# Patient Record
Sex: Male | Born: 1970 | Race: Black or African American | Hispanic: No | Marital: Married | Smoking: Former smoker
Health system: Southern US, Community
[De-identification: ages and names within clinical notes are randomized; demographics above are authoritative.]

## PROBLEM LIST (undated history)

## (undated) DIAGNOSIS — Z86711 Personal history of pulmonary embolism: Secondary | ICD-10-CM

## (undated) DIAGNOSIS — T8859XA Other complications of anesthesia, initial encounter: Secondary | ICD-10-CM

## (undated) DIAGNOSIS — D649 Anemia, unspecified: Secondary | ICD-10-CM

## (undated) DIAGNOSIS — I82409 Acute embolism and thrombosis of unspecified deep veins of unspecified lower extremity: Secondary | ICD-10-CM

## (undated) DIAGNOSIS — N189 Chronic kidney disease, unspecified: Secondary | ICD-10-CM

## (undated) DIAGNOSIS — Z8679 Personal history of other diseases of the circulatory system: Secondary | ICD-10-CM

## (undated) DIAGNOSIS — T4145XA Adverse effect of unspecified anesthetic, initial encounter: Secondary | ICD-10-CM

## (undated) DIAGNOSIS — E669 Obesity, unspecified: Secondary | ICD-10-CM

## (undated) DIAGNOSIS — I714 Abdominal aortic aneurysm, without rupture, unspecified: Secondary | ICD-10-CM

## (undated) DIAGNOSIS — F419 Anxiety disorder, unspecified: Secondary | ICD-10-CM

## (undated) DIAGNOSIS — I517 Cardiomegaly: Secondary | ICD-10-CM

## (undated) DIAGNOSIS — I351 Nonrheumatic aortic (valve) insufficiency: Secondary | ICD-10-CM

## (undated) DIAGNOSIS — I639 Cerebral infarction, unspecified: Secondary | ICD-10-CM

## (undated) DIAGNOSIS — I10 Essential (primary) hypertension: Secondary | ICD-10-CM

## (undated) HISTORY — DX: Essential (primary) hypertension: I10

## (undated) HISTORY — DX: Chronic kidney disease, unspecified: N18.9

## (undated) HISTORY — PX: CORNEAL TRANSPLANT: SHX108

## (undated) HISTORY — DX: Cardiomegaly: I51.7

## (undated) HISTORY — DX: Cerebral infarction, unspecified: I63.9

## (undated) HISTORY — DX: Nonrheumatic aortic (valve) insufficiency: I35.1

## (undated) HISTORY — DX: Personal history of pulmonary embolism: Z86.711

## (undated) HISTORY — DX: Personal history of other diseases of the circulatory system: Z86.79

---
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
## 1995-12-26 HISTORY — PX: CORNEAL TRANSPLANT: SHX108

## 2005-01-23 ENCOUNTER — Emergency Department (HOSPITAL_COMMUNITY): Admission: EM | Admit: 2005-01-23 | Discharge: 2005-01-23 | Payer: Self-pay | Admitting: Emergency Medicine

## 2007-12-26 DIAGNOSIS — Z8679 Personal history of other diseases of the circulatory system: Secondary | ICD-10-CM

## 2007-12-26 DIAGNOSIS — I639 Cerebral infarction, unspecified: Secondary | ICD-10-CM

## 2007-12-26 HISTORY — DX: Cerebral infarction, unspecified: I63.9

## 2007-12-26 HISTORY — DX: Personal history of other diseases of the circulatory system: Z86.79

## 2008-12-09 ENCOUNTER — Inpatient Hospital Stay (HOSPITAL_COMMUNITY): Admission: EM | Admit: 2008-12-09 | Discharge: 2008-12-14 | Payer: Self-pay | Admitting: Emergency Medicine

## 2008-12-10 ENCOUNTER — Encounter (INDEPENDENT_AMBULATORY_CARE_PROVIDER_SITE_OTHER): Payer: Self-pay | Admitting: *Deleted

## 2008-12-10 ENCOUNTER — Encounter: Payer: Self-pay | Admitting: Family Medicine

## 2008-12-10 DIAGNOSIS — I351 Nonrheumatic aortic (valve) insufficiency: Secondary | ICD-10-CM

## 2008-12-10 HISTORY — DX: Nonrheumatic aortic (valve) insufficiency: I35.1

## 2008-12-10 LAB — CONVERTED CEMR LAB: TSH: 1.554 microintl units/mL

## 2008-12-11 ENCOUNTER — Encounter: Payer: Self-pay | Admitting: Family Medicine

## 2008-12-11 LAB — CONVERTED CEMR LAB: Triglycerides: 53 mg/dL

## 2008-12-25 DIAGNOSIS — Z86711 Personal history of pulmonary embolism: Secondary | ICD-10-CM

## 2008-12-25 HISTORY — DX: Personal history of pulmonary embolism: Z86.711

## 2009-02-02 ENCOUNTER — Encounter: Payer: Self-pay | Admitting: Family Medicine

## 2009-02-02 LAB — CONVERTED CEMR LAB
ALT: 12 units/L
Alkaline Phosphatase: 83 units/L
GFR calc Af Amer: 43 mL/min
Total Bilirubin: 0.2 mg/dL

## 2009-02-08 ENCOUNTER — Encounter: Payer: Self-pay | Admitting: Family Medicine

## 2009-05-07 ENCOUNTER — Encounter: Payer: Self-pay | Admitting: Family Medicine

## 2009-05-07 ENCOUNTER — Ambulatory Visit: Payer: Self-pay | Admitting: Family Medicine

## 2009-05-07 ENCOUNTER — Inpatient Hospital Stay (HOSPITAL_COMMUNITY): Admission: EM | Admit: 2009-05-07 | Discharge: 2009-05-09 | Payer: Self-pay | Admitting: Emergency Medicine

## 2009-05-07 ENCOUNTER — Ambulatory Visit: Payer: Self-pay | Admitting: Vascular Surgery

## 2009-05-10 ENCOUNTER — Encounter: Payer: Self-pay | Admitting: Family Medicine

## 2009-05-10 ENCOUNTER — Ambulatory Visit: Payer: Self-pay | Admitting: Family Medicine

## 2009-05-10 DIAGNOSIS — I82409 Acute embolism and thrombosis of unspecified deep veins of unspecified lower extremity: Secondary | ICD-10-CM | POA: Insufficient documentation

## 2009-05-11 LAB — CONVERTED CEMR LAB
HCT: 32.7 % — ABNORMAL LOW (ref 39.0–52.0)
Hemoglobin: 10.9 g/dL — ABNORMAL LOW (ref 13.0–17.0)
MCV: 82.8 fL (ref 78.0–100.0)
RBC: 3.95 M/uL — ABNORMAL LOW (ref 4.22–5.81)
WBC: 7.7 10*3/uL (ref 4.0–10.5)

## 2009-05-12 ENCOUNTER — Ambulatory Visit: Payer: Self-pay | Admitting: Family Medicine

## 2009-05-12 DIAGNOSIS — I10 Essential (primary) hypertension: Secondary | ICD-10-CM | POA: Insufficient documentation

## 2009-05-12 LAB — CONVERTED CEMR LAB: INR: 1.7

## 2009-05-14 ENCOUNTER — Ambulatory Visit: Payer: Self-pay | Admitting: Family Medicine

## 2009-05-21 ENCOUNTER — Ambulatory Visit: Payer: Self-pay | Admitting: Family Medicine

## 2009-05-27 ENCOUNTER — Ambulatory Visit: Payer: Self-pay | Admitting: Family Medicine

## 2009-05-27 DIAGNOSIS — I10 Essential (primary) hypertension: Secondary | ICD-10-CM | POA: Insufficient documentation

## 2009-05-27 DIAGNOSIS — N184 Chronic kidney disease, stage 4 (severe): Secondary | ICD-10-CM | POA: Insufficient documentation

## 2009-06-02 ENCOUNTER — Telehealth: Payer: Self-pay | Admitting: Family Medicine

## 2009-06-02 ENCOUNTER — Ambulatory Visit: Payer: Self-pay | Admitting: Family Medicine

## 2009-06-16 ENCOUNTER — Ambulatory Visit: Payer: Self-pay | Admitting: Family Medicine

## 2009-06-16 LAB — CONVERTED CEMR LAB: INR: 1.7

## 2009-06-30 ENCOUNTER — Ambulatory Visit: Payer: Self-pay | Admitting: Family Medicine

## 2009-06-30 LAB — CONVERTED CEMR LAB: INR: 2.6

## 2009-07-14 ENCOUNTER — Ambulatory Visit: Payer: Self-pay | Admitting: Family Medicine

## 2009-07-14 LAB — CONVERTED CEMR LAB: INR: 2.3

## 2009-07-15 ENCOUNTER — Telehealth (INDEPENDENT_AMBULATORY_CARE_PROVIDER_SITE_OTHER): Payer: Self-pay | Admitting: *Deleted

## 2009-07-18 ENCOUNTER — Encounter: Payer: Self-pay | Admitting: Family Medicine

## 2009-08-04 ENCOUNTER — Ambulatory Visit: Payer: Self-pay | Admitting: Family Medicine

## 2009-08-04 LAB — CONVERTED CEMR LAB: INR: 2.2

## 2009-09-01 ENCOUNTER — Ambulatory Visit: Payer: Self-pay | Admitting: Family Medicine

## 2009-09-01 LAB — CONVERTED CEMR LAB: INR: 2.5

## 2009-09-20 ENCOUNTER — Telehealth: Payer: Self-pay | Admitting: Family Medicine

## 2009-09-30 ENCOUNTER — Encounter: Payer: Self-pay | Admitting: Family Medicine

## 2009-10-07 ENCOUNTER — Telehealth: Payer: Self-pay | Admitting: Family Medicine

## 2009-10-13 ENCOUNTER — Ambulatory Visit: Payer: Self-pay | Admitting: Family Medicine

## 2009-10-13 ENCOUNTER — Encounter: Payer: Self-pay | Admitting: Family Medicine

## 2009-11-03 ENCOUNTER — Ambulatory Visit: Payer: Self-pay | Admitting: Family Medicine

## 2009-11-25 ENCOUNTER — Ambulatory Visit (HOSPITAL_COMMUNITY): Admission: RE | Admit: 2009-11-25 | Discharge: 2009-11-25 | Payer: Self-pay | Admitting: Cardiology

## 2009-12-14 ENCOUNTER — Ambulatory Visit: Payer: Self-pay | Admitting: Vascular Surgery

## 2009-12-21 ENCOUNTER — Encounter: Payer: Self-pay | Admitting: Family Medicine

## 2010-01-07 ENCOUNTER — Encounter: Payer: Self-pay | Admitting: Family Medicine

## 2010-01-20 ENCOUNTER — Encounter: Admission: RE | Admit: 2010-01-20 | Discharge: 2010-01-20 | Payer: Self-pay | Admitting: Cardiology

## 2010-01-20 ENCOUNTER — Encounter: Payer: Self-pay | Admitting: Family Medicine

## 2010-02-11 ENCOUNTER — Ambulatory Visit (HOSPITAL_COMMUNITY): Admission: RE | Admit: 2010-02-11 | Discharge: 2010-02-11 | Payer: Self-pay | Admitting: Cardiovascular Disease

## 2010-02-11 HISTORY — PX: CARDIAC CATHETERIZATION: SHX172

## 2010-02-18 ENCOUNTER — Ambulatory Visit: Payer: Self-pay | Admitting: Vascular Surgery

## 2010-03-07 ENCOUNTER — Encounter: Payer: Self-pay | Admitting: Family Medicine

## 2010-03-28 ENCOUNTER — Emergency Department (HOSPITAL_COMMUNITY): Admission: EM | Admit: 2010-03-28 | Discharge: 2010-03-28 | Payer: Self-pay | Admitting: Emergency Medicine

## 2010-06-14 ENCOUNTER — Encounter: Admission: RE | Admit: 2010-06-14 | Discharge: 2010-06-14 | Payer: Self-pay | Admitting: Vascular Surgery

## 2010-06-14 ENCOUNTER — Ambulatory Visit: Payer: Self-pay | Admitting: Vascular Surgery

## 2010-07-12 ENCOUNTER — Ambulatory Visit: Payer: Self-pay | Admitting: Vascular Surgery

## 2010-08-02 ENCOUNTER — Ambulatory Visit: Payer: Self-pay | Admitting: Vascular Surgery

## 2010-11-04 ENCOUNTER — Ambulatory Visit: Payer: Self-pay | Admitting: Vascular Surgery

## 2011-01-10 DIAGNOSIS — I714 Abdominal aortic aneurysm, without rupture, unspecified: Secondary | ICD-10-CM

## 2011-01-14 ENCOUNTER — Other Ambulatory Visit: Payer: Self-pay | Admitting: Vascular Surgery

## 2011-01-14 DIAGNOSIS — Z01818 Encounter for other preprocedural examination: Secondary | ICD-10-CM

## 2011-01-14 DIAGNOSIS — I714 Abdominal aortic aneurysm, without rupture, unspecified: Secondary | ICD-10-CM

## 2011-01-14 DIAGNOSIS — I7102 Dissection of abdominal aorta: Secondary | ICD-10-CM

## 2011-01-15 ENCOUNTER — Encounter: Payer: Self-pay | Admitting: Cardiovascular Disease

## 2011-01-24 NOTE — Consult Note (Signed)
Summary: [facility]   Imported By: Audie Clear 01/28/2010 16:49:28  _____________________________________________________________________  External Attachment:    Type:   Image     Comment:   External Document

## 2011-01-24 NOTE — Consult Note (Signed)
Summary: [facility]   Imported By: Raymond Gurney 01/10/2010 16:11:42  _____________________________________________________________________  External Attachment:    Type:   Image     Comment:   External Document

## 2011-01-24 NOTE — Consult Note (Signed)
Summary: [facility]   Imported By: Beryle Lathe 03/09/2010 17:05:41  _____________________________________________________________________  External Attachment:    Type:   Image     Comment:   External Document

## 2011-01-31 ENCOUNTER — Ambulatory Visit: Payer: Self-pay | Admitting: Vascular Surgery

## 2011-01-31 ENCOUNTER — Other Ambulatory Visit: Payer: Self-pay

## 2011-02-07 ENCOUNTER — Encounter: Payer: Self-pay | Admitting: Vascular Surgery

## 2011-03-15 LAB — BASIC METABOLIC PANEL
CO2: 25 mEq/L (ref 19–32)
Calcium: 9.1 mg/dL (ref 8.4–10.5)
Creatinine, Ser: 1.89 mg/dL — ABNORMAL HIGH (ref 0.4–1.5)
GFR calc non Af Amer: 40 mL/min — ABNORMAL LOW (ref >60)
Glucose, Bld: 115 mg/dL — ABNORMAL HIGH (ref 70–99)
Sodium: 137 mEq/L (ref 135–145)

## 2011-04-04 LAB — POCT I-STAT 3, ART BLOOD GAS (G3+)
Bicarbonate: 21.8 mEq/L (ref 20.0–24.0)
O2 Saturation: 94 %
TCO2: 23 mmol/L (ref 0–100)
pCO2 arterial: 36.7 mmHg (ref 35.0–45.0)
pH, Arterial: 7.384 (ref 7.350–7.450)
pO2, Arterial: 75 mmHg — ABNORMAL LOW (ref 80.0–100.0)

## 2011-04-04 LAB — BASIC METABOLIC PANEL
BUN: 27 mg/dL — ABNORMAL HIGH (ref 6–23)
CO2: 24 mEq/L (ref 19–32)
CO2: 24 mEq/L (ref 19–32)
Calcium: 9 mg/dL (ref 8.4–10.5)
Chloride: 108 mEq/L (ref 96–112)
Creatinine, Ser: 1.97 mg/dL — ABNORMAL HIGH (ref 0.4–1.5)
GFR calc Af Amer: 51 mL/min — ABNORMAL LOW (ref >60)
Glucose, Bld: 114 mg/dL — ABNORMAL HIGH (ref 70–99)
Glucose, Bld: 98 mg/dL (ref 70–99)
Potassium: 4.6 mEq/L (ref 3.5–5.1)
Potassium: 5.2 mEq/L — ABNORMAL HIGH (ref 3.5–5.1)
Sodium: 136 mEq/L (ref 135–145)

## 2011-04-04 LAB — CK TOTAL AND CKMB (NOT AT ARMC)
CK, MB: 2.9 ng/mL (ref 0.3–4.0)
Total CK: 121 U/L (ref 7–232)

## 2011-04-04 LAB — CBC
Hemoglobin: 11.8 g/dL — ABNORMAL LOW (ref 13.0–17.0)
Hemoglobin: 12.7 g/dL — ABNORMAL LOW (ref 13.0–17.0)
MCHC: 33.5 g/dL (ref 30.0–36.0)
MCHC: 33.6 g/dL (ref 30.0–36.0)
RBC: 4.29 MIL/uL (ref 4.22–5.81)
RBC: 4.59 MIL/uL (ref 4.22–5.81)
RDW: 13.4 % (ref 11.5–15.5)
WBC: 5.4 10*3/uL (ref 4.0–10.5)

## 2011-04-04 LAB — POCT CARDIAC MARKERS
CKMB, poc: 2.1 ng/mL (ref 1.0–8.0)
Myoglobin, poc: 145 ng/mL (ref 12–200)
Troponin i, poc: <0.05 ng/mL (ref 0.00–0.09)

## 2011-04-04 LAB — PSA: PSA: 0.53 ng/mL (ref 0.10–4.00)

## 2011-04-04 LAB — CARDIAC PANEL(CRET KIN+CKTOT+MB+TROPI)
CK, MB: 2.5 ng/mL (ref 0.3–4.0)
Relative Index: INVALID (ref 0.0–2.5)
Total CK: 95 U/L (ref 7–232)

## 2011-04-04 LAB — DIFFERENTIAL
Basophils Relative: 0 % (ref 0–1)
Lymphs Abs: 0.9 10*3/uL (ref 0.7–4.0)
Monocytes Absolute: 0.6 10*3/uL (ref 0.1–1.0)
Monocytes Relative: 11 % (ref 3–12)
Neutro Abs: 3.7 10*3/uL (ref 1.7–7.7)
Neutrophils Relative %: 68 % (ref 43–77)

## 2011-04-04 LAB — APTT: aPTT: 73 seconds — ABNORMAL HIGH (ref 24–37)

## 2011-05-09 NOTE — H&P (Signed)
NAME:  TARAN, LOVEDAY NO.:  [id number]   MEDICAL RECORD NO.:  [id number]          PATIENT TYPE:  INP   LOCATION:  39                         FACILITY:  [facility]   PHYSICIAN:  Neysa Bonito, MD  DATE OF BIRTH:  Aug 26, 1971   DATE OF ADMISSION:  12/09/2008  DATE OF DISCHARGE:                              HISTORY & PHYSICAL   PRIMARY CARE PHYSICIAN:  Unassigned.   CHIEF COMPLAINT:  Low back pain   HISTORY OF PRESENT ILLNESS:  Mr. Zervas is a 40 year old pleasant  African American male with past medical history significant for  hypertension.  The patient is not taking antihypertensive medication and  he is not following with any physician for quite awhile.  Today during  his working, he felt severe low back pain.  The pain was continuous and  he checked himself in with Urgent Care Clinic.  At the Urgent Care  Clinic, the patient was found to have severely elevated blood pressure  260/180 and they also noticed the patient to have proteinuria and  elevated creatinine.  The patient then was instructed to come to the  emergency department.  In the emergency department here, the first blood  pressure recorded is 261/174.  Currently, his systolic blood pressure is  around 200 on Cardene drip.  The Hospitalist Service was called to  assist with management of hypertensive urgency.   The patient currently denied any pain in his back and he denied any  other pain.  The patient denied fever, shortness of breath, headache,  change in vision.   PAST MEDICAL HISTORY:  1. Hypertension.  2. Medical noncompliance.   ALLERGIES:  NO KNOWN MEDICATION ALLERGIES.   MEDICATIONS:  The patient is not taking any medication.   SOCIAL HISTORY:  He denied drug abuse, tobacco abuse or ethanol abuse.  He lives alone in [place] area.   FAMILY HISTORY:  Significant for hypertension in the mother's side of  the family.   REVIEW OF SYSTEMS:  A 14-point review of systems is not  contributory  other than the HPI.   PHYSICAL EXAMINATION:  VITAL SIGNS:  Current temperature is 97.8, last  recorded blood pressure is 221/142, pulse is 86, respiratory rate is 22.  HEENT:  Head atraumatic, normocephalic.  Eyes:  PERRL.  Mouth moist.  No  ulcer.  NECK:  Supple.  No JVD.  LUNGS:  Bilateral decreased air entry felt secondary to the body  habitus.  PRECORDIUM:  First and second heart sound distally audible.  ABDOMEN:  Obese, soft, nontender.  BACK:  There is no point of tenderness in the back.  There is no CVA.  NEUROLOGICALLY:  Alert, oriented x3, sitting in the chair moving all his  extremities spontaneously.  LOWER EXTREMITIES:  There is no edema on the lower extremity  examination.   LABORATORY DATA:  Urine for toxicology screen is essentially negative.  Sodium 140, potassium 3.9, bicarb 26, chloride 106, glucose 112, BUN 29,  creatinine 2.32, estimated GFR is 38, troponin is less than 0.05,  myoglobin is 163, CK-MB is 4.4, white blood count is 4.7, hemoglobin is  14.6, hematocrit is 44.8  and platelet count is 194.   DIAGNOSTICS:  1. EKG pending.  Please see paper work for documentation.  2. Chest x-ray is showing heart is markedly enlarged.  There is      pulmonary vascular congestion without edema or effusion.  There is      no focal infiltrate.   ASSESSMENT:  A 40 year old with hypertensive urgency.  The patient had  evidence of end-organ damage with renal failure and cardiomegaly.   PLAN:  1. The patient will be admitted to monitoring setting in the step-down      units.  We will continue the IV calcium channel blocker.  We will      add oral medication to control the blood pressure and try to wean      him off of the IV drip as soon as possible.  We will be cognizant      not to drop his blood pressure to the normal rapidly.  2. We will obtain a 2-D echo to evaluate for the heart function.  3. We will obtain sonogram to evaluate the kidney.  We will  check      urinalysis and may be 24 hours urine protein.  The patient may need      nephrology assessment at least as an outpatient for worsening renal      failure.  There is no recent renal function panel to compare to.  4. For DVT prophylaxis, we will start the patient on sequential      pneumatic device SCD until the blood pressure is stabilized and      then we will consider blood thinners.  5. For GI prophylaxis, we will start Protonix.      Neysa Bonito, MD  Electronically Signed     EME/MEDQ  D:  12/09/2008  T:  12/10/2008  Job:  [id number]

## 2011-05-09 NOTE — H&P (Signed)
NAME:  [name], [name] NO.:  [id number]   MEDICAL RECORD NO.:  [id number]          PATIENT TYPE:  INP   LOCATION:  2012                         FACILITY:  [facility]   PHYSICIAN:  Talbert Cage, M.D.DATE OF BIRTH:  1971-08-14   DATE OF ADMISSION:  05/07/2009  DATE OF DISCHARGE:                              HISTORY & PHYSICAL   PRIMARY CARE Aspynn Clover:  Unassigned.   PRIMARY CARDIOLOGIST:  Quincy Carnes, MD   CHIEF COMPLAINT:  Chest pain.   HISTORY OF PRESENT ILLNESS:  A 40 year old male newly diagnosed with  hypertension presents with left-sided chest discomfort and left shoulder  pain.  Yesterday a.m. approximately 30 minutes to 1 hour after walking  on treadmill, which lasted 5-minute work out, the patient developed left-  sided chest pain, worse on inspiration.  Pain described as discomfort.  Denies any sharp pain.  Later that evening, the patient was sleeping, he  began having left shoulder pain.  This a.m. left shoulder pain was  associated with left chest pressure, which was nonradiating, worse with  deep inspiration and relieved by Percocet given in ED.  Denies shortness  of breath, nausea, vomiting, diaphoresis associated with episodes.   REVIEW OF SYSTEMS:  Denies fever.  Denies recent illness.  Denies  abdominal pain.  Denies back pain.  Denies change in bowel.  Denies loss  of consciousness.  Denies trauma.  Denies leg swelling.  Denies extended  car trips or plane rides.   PAST MEDICAL HISTORY:  1. Hypertension.  2. Chronic renal insufficiency, baseline 2-2.5.  3. Obesity.  4. Mild-to-moderate aortic regurg.  5. Left ventricular hypertrophy secondary to hypertension.  6. Back pain.  7. Questionable abdominal aortic aneurysm.   PAST SURGICAL HISTORY:  Corneal transplant, 1997.   ALLERGIES:  No known drug allergies.   MEDICATIONS:  1. Hydralazine 50 mg p.o. t.i.d.  2. Twynsta 1 p.o. daily.  3. Aspirin 81 mg 2 tablets p.o. daily.  4.  Clonidine 0.3 mg p.o. b.i.d.  5. Labetalol 600 mg p.o. b.i.d.  6. Pravastatin 20 mg p.o. daily.   SOCIAL HISTORY:  Previous truck Geophysicist/field seismologist.  Currently unemployed.  Denies  tobacco, illicit, or alcohol.   FAMILY HISTORY:  Mother has hypertension.  Denies any bleeding disorders  or coagulopathy disorder in the family.   PHYSICAL EXAMINATION:  VITAL SIGNS:  Temperature 99.4, respiratory rate  16-20, heart rate 66-84, blood pressure 129-136/81-93, O2 sat 97% on  room air.  GENERAL:  No acute distress.  Alert and oriented x3.  Obese.  HEENT:  PERRL.  Nonicteric.  EOMI.  Left corneal transplant.  Oropharynx  clear.  Moist mucous membranes.  No lymphadenopathy.  CVS:  Regular rate and rhythm.  A 2/6 systolic murmur.  No chest wall  pain.  RESPIRATORY:  CTAP.  Equal and good air movement bilaterally.  ABDOMEN:  Positive bowel sounds.  Soft, nontender, nondistended.  No  masses.  EXTREMITIES:  No edema, calves are 15 cm bilaterally.  Negative Homans.  Pulses are 2+.  NEUROLOGIC:  No focal deficits.  Motor 5/5 bilaterally.   LABORATORY DATA:  D-dimer 9.19.  BMET:  Sodium 138,  potassium 5.2,  chloride 108, CO2 of 24, BUN 27, creatinine 1.97, glucose 114, calcium  9.5.  CBC:  White count 5.4, hemoglobin 12.7, hematocrit 37.7, platelets  196.  Differential within normal limits.  Point of care negative x1.  Chest x-ray, impression, stable cardiomegaly, no acute lung disease.  EKG, heart rate 71, normal sinus rhythm, inverted T-waves in lateral  leads, no Q wave.   ASSESSMENT AND PLAN:  A 40 year old male admitted with new onset left-  sided chest pressure concerning for PE or coronary artery disease.  1. Chest pain.  Multiple differentials for the patient's chest pain      including pulmonary embolism in the setting of sedentary lifestyle      and increased D-dimer.  Of note, Wells criteria 0.  No history of      lower extremity swelling.  Coronary artery disease is also      differential,  although the patient would be characterized as      atypical chest pain.  Other differentials include infection,      however, there is no evidence of pneumonia on chest x-ray;      musculoskeletal pain or dissection with history of AAA, but less      likely.  We will admit to telemetry, cycle cardiac enzymes.  Repeat      EKG in a.m.[name]  Continue home antihypertensive medications and treat      with full dose Lovenox until PE ruled out.  For pulmonary embolism,      we will obtain lower extremity Dopplers and ABGs to look at A-a      gradient before committing to PE and a course of Coumadin.  If      negative, we will discontinue treatment with Lovenox.  If      equivocal, we will obtain CT angiogram of chest and prophylaxis for      renal damaging with IV fluids and Mucomyst.  We will not obtain CT      angiogram at this time in setting of chronic renal insufficiency      and unclear etiology of chest pain.  Of note, the patient is not      hypoxic or dyspneic.  2. Hypertension.  Blood pressure stable on meds.  We will continue      home regimen.  3. Chronic renal insufficiency.  Patient at baseline.  We will      prophylaxis for contrast needed for CT angiogram.  4. Fluids, electrolytes, nutrition/gastrointestinal.  No IV fluids      needed.  Heart-healthy diet.  Potassium minimally      elevated at 5.2.  We will repeat BMET in a.m.  5. Prophylaxis. Lovenox per pharmacy treatment dose, renally adjusted.   DISPOSITION:  Pending workup.      Vic Blackbird, MD  Electronically Signed      Talbert Cage, M.D.  Electronically Signed    KD/MEDQ  D:  05/07/2009  T:  05/08/2009  Job:  [id number]

## 2011-05-09 NOTE — Consult Note (Signed)
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  He had some back and flank discomfort and went to the  emergency room and was referred to Dr. Felton Clinton who apparently  discovered left ventricular hypertrophy and subsequent studies revealed  by ultrasound an infrarenal aneurysm which appeared to be an aortic  dissection.  He had a CT scan performed on 11/25/2009 which I reviewed  today and this reveals what appears to a dissection of his infrarenal  aorta with two clear channels at the false lumen extending into the left  common iliac artery and the true lumen into both the right and left  common iliac arteries with maximum diameter of 4.6-4.8 cm in the  infrarenal aorta.  There was not a full chest CT scan performed but  there does not appear to be a dissection above the renal arteries up to  the diaphragm level.  He has had no further abdominal or back symptoms.   CHRONIC STABLE MEDICAL PROBLEMS:  1. Hypertension.  2. History of bilateral DVT in 2009.  3. Pulmonary embolus on chronic Coumadin.  4. Hypertension.  5. Chronic renal insufficiency with creatinine of 1.9-2.0.  6. Left ventricular hypertrophy.   FAMILY HISTORY:  Negative for coronary artery disease, diabetes and  stroke.   SOCIAL HISTORY:  The patient is single, unemployed, has not smoked  cigarettes in 20 years.  Does not use alcohol.   REVIEW OF SYSTEMS:  Has had weight gain of about 8 pounds over the  holidays.  Has dyspnea on exertion.  Legs become weak with walking but  is able to walk 1 mile.  Has chronic constipation, urinary frequency.  All other systems and review of systems are negative.   PHYSICAL EXAM:  Blood pressure is 175/129, heart rate  is 98,  respirations 14, heart rate 70.  General:  He is an obese middle-aged  male in no apparent distress, alert and oriented x3.  Neck is supple, 3+  carotid pulses palpable.  No bruits are audible.  Neurologic exam is  normal.  No palpable adenopathy in the neck.  Chest clear to  auscultation.  Cardiovascular exam is regular rhythm.  No murmurs.  Abdomen is obese.  No palpable masses.  He has 3+ femoral and posterior  tibial pulses bilaterally.  Both feet are well-perfused.  No skin rashes  are noted.  Musculoskeletal exam reveals no joint deformities.   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  No indication for treatment at  this time.  Will follow this in 6 months with a chest and abdominal CT  angiogram to rule out any chest involvement and also to see if the size  has changed.  He will continue to be followed by his medical doctor and  Dr. Felton Clinton for his hypertension.     Nelda Severe Kellie Simmering, M.D.  Electronically Signed   JDL/MEDQ  D:  12/14/2009  T:  12/15/2009  Job:  [id number]   cc:   Vic Blackbird, MD  Quincy Carnes, MD

## 2011-05-09 NOTE — Assessment & Plan Note (Signed)
OFFICE VISIT   [name]  DOB:  Jul 09, 1971                                       06/14/2010  [id number]   The patient returns for continued followup regarding his infrarenal  aortic dissection which was diagnosed in the emergency room about 18  months ago.  I saw him in December of this year at which time the  aneurysm measured about 4.6 to 4.8 cm in maximal diameter.  The  dissection extended into the left common iliac artery (false lumen), in  the true lumen extended into both iliac arteries.  Today he had Jack full  chest CT scan in addition to his abdominal CT angiogram which I have  reviewed.  The official interpretation by the radiologist is not  available at this time.  He does not appear to have any dissection in  the thoracic aorta and the dissection does appear to begin distal to the  renal arteries and extend down to the bifurcation with false lumen  extending into the left side and the true lumen into both sides.  Both  common iliac arteries are patent.  He has had no chest pain, abdominal  or flank pain since I last saw him.   CHRONIC MEDICAL PROBLEMS:  1. Hypertension.  2. History of bilateral DVT in 2009.  3. Pulmonary embolus in 2010 on Coumadin for 6 months and now      discontinued.  4. Chronic renal insufficiency.  Creatinine in the 1.9 to 2 range.   FAMILY HISTORY:  Negative for coronary artery disease, diabetes and  stroke.   SOCIAL HISTORY:  Has not smoked in 20 years.   REVIEW OF SYSTEMS:  Denies any chest pain, dyspnea on exertion.  Does  have constipation and chronic kidney disease, urinary frequency.  All  other systems negative.   PHYSICAL EXAM:  Vital signs:  He weighs 300 pounds, 6 feet tall.  Blood  pressure 120/80, heart rate is 60.  General:  He is Jack well-developed,  well-nourished obese male who is in no apparent distress, alert and  oriented times 3.  HEENT:  Exam normal for age.  EOMs intact.   Lungs:  Clear with no rhonchi or wheezing.  Cardiovascular:  Regular rhythm, no  murmurs.  Abdomen:  Is obese.  No pulsatile mass palpable.  Lower  extremity exam reveals 3+ femoral and posterior tibial pulses  bilaterally.   Today it appears that the infrarenal portion of the aneurysm has  enlarged to about 5 cm in maximum diameter with no change in flow  lumens.  I will see him in 9 months with Jack repeat CT angiogram and we  may need to consider aortic stent grafting at that time depending on the  size.     [name] [name], M.D.  Electronically Signed   JDL/MEDQ  D:  06/14/2010  T:  06/15/2010  Job:  [id number]

## 2011-05-09 NOTE — Discharge Summary (Signed)
NAME:  [name], [name] NO.:  [id number]   MEDICAL RECORD NO.:  [id number]          PATIENT TYPE:  INP   LOCATION:  2012                         FACILITY:  [facility]   PHYSICIAN:  Talbert Cage, M.D.DATE OF BIRTH:  06-Dec-1971   DATE OF ADMISSION:  05/07/2009  DATE OF DISCHARGE:  05/09/2009                               DISCHARGE SUMMARY   DISCHARGE DIAGNOSES:  1. Bilateral lower extremity deep venous thromboses.  2. Pulmonary embolism.  3. Hypertension.  4. Chronic renal insufficiency, baseline 2 to 2.5.  5. Left ventricular hypertrophy secondary to hypertension.  6. Obesity.   DISCHARGE MEDICATIONS:  1. Labetalol 600 mg p.o. b.i.d.  2. Twynsta one tablet p.o. daily.  3. Aspirin 81 mg two tablets daily.  4. Hydralazine 50 mg p.o. t.i.d.  5. Clonidine 0.3 mg p.o. b.i.d.  6. Lovenox 150 mg b.i.d.  7. Coumadin 5 mg two tablets p.o. daily until dose change by primary      care physician.   DISCONTINUE MEDICATIONS:  None.   CONSULTANT:  Pharmacy for Coumadin and Lovenox teaching.   IMAGING:  1. Lower extremity Dopplers; right leg DVT from distal calf to      popliteal, left leg distal calf DVT to mid calf.  2. Chest x-ray May 07, 2009.  Impression, no active lung disease.      Stable cardiomegaly.   DISCHARGE LABS:  INR 1.2, PT 15.8, PTT 73.  PSA 0.53.  Hemoglobin 11.8,  hematocrit 35.2, platelets 181.  Cardiac enzymes negative x2.   BRIEF HOSPITAL COURSE:  A 40 year old male with recently diagnosed  hypertension, chronic renal insufficiency secondary to hypertensive  disease admitted with pleuritic left-sided chest discomfort and left  shoulder pain.  The patient with elevated D-dimer during evaluation in  ED with leading diagnosis of pulmonary embolism.  1. Chronic chest pain.  The patient with pleuritic chest pain.      Differential diagnosis included pulmonary embolus versus infection      versus myocardial ischemia versus musculoskeletal pain.   The      patient was admitted to telemetry, lower extremity Dopplers were      done to rule out pulmonary embolism.  This patient had elevated D-      dimer 9.19, however, with chronic renal insufficiency.  CT      angiogram was unable to be obtained.  Bilateral lower extremity      Dopplers revealed bilateral lower extremity deep venous thromboses.      ABG was obtained which showed an increased AA gradient of 28.9      consistent with pulmonary embolism as likely cause of the patient's      pleuritic chest pain of new onset.  The patient was started on      Lovenox to Coumadin bridge.  At discharge, INR was 1.2.  The      patient has arranged follow up at [facility]      as his new primary care [name] also to follow up INR levels while      on Coumadin.  Of note, other differentials for chest pain included  infection were ruled out.  Chest x-ray did not show any evidence of      infection.  The patient had no leukocytosis and no fever.  Cardiac      enzymes were negative x2.  EKG did not show any evidence of      myocardial ischemia.  Of note, EKG did show slightly prolonged QT      and left ventricular hypertrophy.   For pulmonary embolism workup, age specific causes of malignancy were  also done.  PSA was within normal limits.  The patient may have  colonoscopy as an outpatient to look for other causes of hypercoagulable  state.  Of note, the patient not have any evidence of bleed and did not  complain of any bright red blood per rectum or hematochezia.  Hypercoagulable panel was not done during this admission as we would not  change the patient's further workup or treatment.  The patient does live  quite sedentary lifestyle with obesity, status post recent  hospitalization in December 2009.  The patient will need to be treated  for at least 6 months with new onset PE, goal INR 2 to 3.   Hypertension.  The patient's blood pressure was maintained on  home  medication regimen of labetalol, Twynsta, and hydralazine.  The patient  will continue at discharge.   Chronic renal insufficiency.  Based on history of chronic renal  insufficiency, creatinine remained below baseline during admission.  At  discharge, creatinine was 1.80.   Obesity, unchanged.  The patient has new primary care [name] who will  discuss weight loss options with the patient if the patient motivated.   ISSUES FOR FOLLOWUP:  1. INR level of goal 2 to 3, Lovenox to Coumadin bridge.  2. Weight loss.  3. Colonoscopy as outpatient.   DISCHARGE INSTRUCTIONS:  The patient is to return for any evidence of  bleeding.  The patient was given handout regarding heart healthy as well  as low vitamin K diet while on Coumadin.   FOLLOWUP APPOINTMENTS:  [name], [name] May 12, 2009.  Clinic will call to confirm appointment time.  Lab visit May 10, 2009 between 8:30 and 12.   DISCHARGE CONDITION:  Stable/improved.   DISCHARGE LOCATION:  Home.      Vic Blackbird, MD  Electronically Signed      Talbert Cage, M.D.  Electronically Signed    KD/MEDQ  D:  05/10/2009  T:  05/11/2009  Job:  [id number]

## 2011-05-12 NOTE — Discharge Summary (Signed)
NAME:  [name], [name] NO.:  [id number]   MEDICAL RECORD NO.:  [id number]          PATIENT TYPE:  INP   LOCATION:  2013                         FACILITY:  [facility]   PHYSICIAN:  Quincy Carnes, MD      DATE OF BIRTH:  October 17, 1971   DATE OF ADMISSION:  12/09/2008  DATE OF DISCHARGE:  12/14/2008                               DISCHARGE SUMMARY   DISCHARGE DIAGNOSES:  1. Back pain.  2. Hypertensive crisis.  3. Chronic kidney disease.  4. Obesity.  5. Noncompliance.  6. Mild-to-moderate aortic regurgitation.  7. 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   LABORATORY DATA:  WBCs 6.4, hemoglobin 12.1, hematocrit 37.6, platelets  158.  Sodium 138, potassium 4.1, chloride 106, CO2 of 25, glucose 103,  BUN 23, creatinine 2.10.  At discharge on December 09, 2008, creatinine  was 2.32, total protein was 6, albumin 3.1, magnesium 2.2.  CK-MB #1  192/6.1, troponin 0.11; #2 155/5.2, troponin of 0.17; #3 138/4.8,  troponin of 0.33; #4 121/4.1, troponin of 0.39.  BNP was 493.  Total  cholesterol was 138, triglycerides 53, LDL was 77 and HDL was 50.  TSH  1.554.  Urine drug screen was negative.  A 2-D echocardiogram showed EF  of 65%, small free-flowing pericardial effusion, mild aortic root  dilatation, mild-to-moderate AVR.   DISCHARGE MEDICATIONS:  1. Hydralazine 25 mg every 8 hours.  2. Amlodipine 10 mg a day.  3. Aspirin 81 mg 2 a day.  4. Clonidine 0.3 twice a day.  5. Labetalol 600 mg twice a day.   He will follow up with Dr. Quincy Carnes on January 01, 2009, at 3:15.  He will have renal Dopplers done on December 29, 2008, at 11:00 a.m.   HOSPITAL COURSE:  [name] is a 40 year old male who has a prior  history of hypertension.  He was apparently noncompliant with his  medications.  He came in with a back pain.  His blood pressure was  elevated.  Initial blood pressure reading 260/170.  He was seen by  [facility] and admitted on December 10, 2008.  We were asked to consult.  He had slight CK-MBs and troponins elevated.  We decided to have him  transferred to [facility] and adjusted his medications.  He had initially been placed on a  Cardene drip.  This was discontinued.  He was transferred to [facility] and was seen on December 12, 2008.  His medications continued  to be titrated upward and on December 14, 2008, he was considered stable  for discharge home.      Cyndia Bent, N.P.      Quincy Carnes, MD  Electronically Signed    BB/MEDQ  D:  01/25/2009  T:  01/26/2009  Job:  [id number]

## 2011-06-27 ENCOUNTER — Encounter: Payer: Self-pay | Admitting: Family Medicine

## 2011-09-29 LAB — CBC
HCT: 37.6 % — ABNORMAL LOW (ref 39.0–52.0)
HCT: 36.2 % — ABNORMAL LOW (ref 39.0–52.0)
HCT: 38 % — ABNORMAL LOW (ref 39.0–52.0)
HCT: 39.3 % (ref 39.0–52.0)
Hemoglobin: 12.1 g/dL — ABNORMAL LOW (ref 13.0–17.0)
Hemoglobin: 13.4 g/dL (ref 13.0–17.0)
Hemoglobin: 14.6 g/dL (ref 13.0–17.0)
Hemoglobin: 12 g/dL — ABNORMAL LOW (ref 13.0–17.0)
Hemoglobin: 12.1 g/dL — ABNORMAL LOW (ref 13.0–17.0)
Hemoglobin: 12.7 g/dL — ABNORMAL LOW (ref 13.0–17.0)
MCHC: 32.1 g/dL (ref 30.0–36.0)
MCHC: 31.9 g/dL (ref 30.0–36.0)
MCHC: 32.4 g/dL (ref 30.0–36.0)
MCHC: 33 g/dL (ref 30.0–36.0)
MCV: 81.5 fL (ref 78.0–100.0)
MCV: 81.6 fL (ref 78.0–100.0)
MCV: 80.4 fL (ref 78.0–100.0)
Platelets: 158 10*3/uL (ref 150–400)
Platelets: 162 10*3/uL (ref 150–400)
RBC: 4.62 MIL/uL (ref 4.22–5.81)
RBC: 5.47 MIL/uL (ref 4.22–5.81)
RBC: 4.51 MIL/uL (ref 4.22–5.81)
RBC: 4.85 MIL/uL (ref 4.22–5.81)
RDW: 14.3 % (ref 11.5–15.5)
RDW: 14.6 % (ref 11.5–15.5)
RDW: 14.7 % (ref 11.5–15.5)
WBC: 4.7 10*3/uL (ref 4.0–10.5)
WBC: 6.4 10*3/uL (ref 4.0–10.5)

## 2011-09-29 LAB — COMPREHENSIVE METABOLIC PANEL
ALT: 19 U/L (ref 0–53)
ALT: 12 U/L (ref 0–53)
AST: 21 U/L (ref 0–37)
BUN: 36 mg/dL — ABNORMAL HIGH (ref 6–23)
CO2: 25 mEq/L (ref 19–32)
CO2: 24 mEq/L (ref 19–32)
Calcium: 8.6 mg/dL (ref 8.4–10.5)
Calcium: 8.7 mg/dL (ref 8.4–10.5)
Chloride: 106 mEq/L (ref 96–112)
Creatinine, Ser: 2.46 mg/dL — ABNORMAL HIGH (ref 0.4–1.5)
GFR calc non Af Amer: 36 mL/min — ABNORMAL LOW (ref >60)
GFR calc non Af Amer: 29 mL/min — ABNORMAL LOW (ref >60)
Glucose, Bld: 112 mg/dL — ABNORMAL HIGH (ref 70–99)
Glucose, Bld: 99 mg/dL (ref 70–99)
Sodium: 140 mEq/L (ref 135–145)
Sodium: 135 mEq/L (ref 135–145)
Total Bilirubin: 1 mg/dL (ref 0.3–1.2)
Total Protein: 5.7 g/dL — ABNORMAL LOW (ref 6.0–8.3)

## 2011-09-29 LAB — CATECHOLAMINES, FRACTIONATED, URINE, 24 HOUR
Catecholamines T: 40 ug/24hr (ref <100)
Epinephrine 24 Hr Urine: 2 ug/24hr (ref <20)
Norepinephrine 24 Hr Urine: 37 ug/24hr (ref <80)
Volume, Urine-CATEUR: 2200 mL

## 2011-09-29 LAB — POCT CARDIAC MARKERS
CKMB, poc: 4.4 ng/mL (ref 1.0–8.0)
Myoglobin, poc: 163 ng/mL (ref 12–200)
Troponin i, poc: <0.05 ng/mL (ref 0.00–0.09)

## 2011-09-29 LAB — BASIC METABOLIC PANEL
BUN: 23 mg/dL (ref 6–23)
BUN: 34 mg/dL — ABNORMAL HIGH (ref 6–23)
CO2: 24 mEq/L (ref 19–32)
CO2: 25 mEq/L (ref 19–32)
Calcium: 9.2 mg/dL (ref 8.4–10.5)
Calcium: 8.7 mg/dL (ref 8.4–10.5)
Chloride: 106 mEq/L (ref 96–112)
Chloride: 106 mEq/L (ref 96–112)
Creatinine, Ser: 2.1 mg/dL — ABNORMAL HIGH (ref 0.4–1.5)
Creatinine, Ser: 2.32 mg/dL — ABNORMAL HIGH (ref 0.4–1.5)
GFR calc Af Amer: 38 mL/min — ABNORMAL LOW (ref >60)
GFR calc Af Amer: 43 mL/min — ABNORMAL LOW (ref >60)
GFR calc non Af Amer: 28 mL/min — ABNORMAL LOW (ref >60)
Glucose, Bld: 103 mg/dL — ABNORMAL HIGH (ref 70–99)
Glucose, Bld: 102 mg/dL — ABNORMAL HIGH (ref 70–99)
Glucose, Bld: 103 mg/dL — ABNORMAL HIGH (ref 70–99)
Potassium: 4.4 mEq/L (ref 3.5–5.1)
Potassium: 4.5 mEq/L (ref 3.5–5.1)
Sodium: 140 mEq/L (ref 135–145)
Sodium: 136 mEq/L (ref 135–145)

## 2011-09-29 LAB — URINALYSIS, ROUTINE W REFLEX MICROSCOPIC
Leukocytes, UA: NEGATIVE
Nitrite: NEGATIVE
Protein, ur: 100 mg/dL — AB
Specific Gravity, Urine: 1.013 (ref 1.005–1.030)
Urobilinogen, UA: 0.2 mg/dL (ref 0.0–1.0)

## 2011-09-29 LAB — LIPID PANEL
Cholesterol: 138 mg/dL (ref 0–200)
HDL: 50 mg/dL (ref >39)
LDL Cholesterol: 77 mg/dL (ref 0–99)
Total CHOL/HDL Ratio: 2.8 RATIO
Triglycerides: 53 mg/dL (ref <150)

## 2011-09-29 LAB — DIFFERENTIAL
Lymphs Abs: 0.6 10*3/uL — ABNORMAL LOW (ref 0.7–4.0)
Monocytes Absolute: 0.2 10*3/uL (ref 0.1–1.0)
Monocytes Relative: 5 % (ref 3–12)
Neutro Abs: 3.9 10*3/uL (ref 1.7–7.7)
Neutrophils Relative %: 82 % — ABNORMAL HIGH (ref 43–77)

## 2011-09-29 LAB — RAPID URINE DRUG SCREEN, HOSP PERFORMED
Cocaine: NOT DETECTED
Tetrahydrocannabinol: NOT DETECTED

## 2011-09-29 LAB — METANEPHRINES, PLASMA: Metanephrine, Free: <25 pg/mL (ref <=57)

## 2011-09-29 LAB — CARDIAC PANEL(CRET KIN+CKTOT+MB+TROPI)
CK, MB: 4.8 ng/mL — ABNORMAL HIGH (ref 0.3–4.0)
CK, MB: 5.2 ng/mL — ABNORMAL HIGH (ref 0.3–4.0)
Relative Index: 3.2 — ABNORMAL HIGH (ref 0.0–2.5)
Relative Index: 3.5 — ABNORMAL HIGH (ref 0.0–2.5)
Total CK: 192 U/L (ref 7–232)
Troponin I: 0.11 ng/mL — ABNORMAL HIGH (ref 0.00–0.06)
Troponin I: 0.33 ng/mL — ABNORMAL HIGH (ref 0.00–0.06)
Troponin I: 0.39 ng/mL — ABNORMAL HIGH (ref 0.00–0.06)

## 2011-09-29 LAB — TSH: TSH: 1.554 u[IU]/mL (ref 0.350–4.500)

## 2011-09-29 LAB — URINE MICROSCOPIC-ADD ON

## 2012-01-26 ENCOUNTER — Other Ambulatory Visit: Payer: Self-pay | Admitting: Vascular Surgery

## 2012-01-26 ENCOUNTER — Encounter: Payer: Self-pay | Admitting: Vascular Surgery

## 2012-01-27 LAB — BUN: BUN: 29 mg/dL — ABNORMAL HIGH (ref 6–23)

## 2012-01-27 LAB — CREATININE, SERUM: Creat: 2.56 mg/dL — ABNORMAL HIGH (ref 0.50–1.35)

## 2012-01-29 ENCOUNTER — Encounter: Payer: Self-pay | Admitting: Vascular Surgery

## 2012-01-30 ENCOUNTER — Ambulatory Visit
Admission: RE | Admit: 2012-01-30 | Discharge: 2012-01-30 | Disposition: A | Discharge status: Home / Self Care | Payer: Medicare Other | Source: Ambulatory Visit | Attending: Vascular Surgery | Admitting: Vascular Surgery

## 2012-01-30 ENCOUNTER — Ambulatory Visit
Admission: RE | Admit: 2012-01-30 | Discharge: 2012-01-30 | Disposition: A | Discharge status: Home / Self Care | Payer: Managed Care, Other (non HMO) | Source: Ambulatory Visit | Attending: Vascular Surgery | Admitting: Vascular Surgery

## 2012-01-30 ENCOUNTER — Ambulatory Visit (INDEPENDENT_AMBULATORY_CARE_PROVIDER_SITE_OTHER): Payer: Managed Care, Other (non HMO) | Admitting: Vascular Surgery

## 2012-01-30 ENCOUNTER — Other Ambulatory Visit: Payer: Self-pay

## 2012-01-30 ENCOUNTER — Encounter: Payer: Self-pay | Admitting: Vascular Surgery

## 2012-01-30 VITALS — BP 145/96 | HR 67 | Resp 16 | Ht 72.0 in | Wt 327.0 lb

## 2012-01-30 DIAGNOSIS — I714 Abdominal aortic aneurysm, without rupture, unspecified: Secondary | ICD-10-CM

## 2012-01-30 DIAGNOSIS — Z01818 Encounter for other preprocedural examination: Secondary | ICD-10-CM

## 2012-01-30 DIAGNOSIS — I7102 Dissection of abdominal aorta: Secondary | ICD-10-CM

## 2012-01-30 NOTE — Progress Notes (Signed)
Subjective:     Patient ID: [name], male   DOB: 06/16/1971, 41 y.o.   MRN: [id number]  HPI this 41 year old male returns today for further discussion regarding his infrarenal aortic dissection with secondary aneurysm. I last saw him in June of 2011 at which time the aneurysm was 4.6 x 4.8 cm in maximum diameter. Since then he has had a cardiac catheterization which apparently looked good according to him. He has had no back or abdominal symptoms today he had a CT scan which are reviewed by computer. The maximum diameter of his infrarenal aortic aneurysm is now 5.6 cm. We were unable to assess the true and false lumens because of a creatinine of 2.56 and inability to give contrast. Previous CT scans have revealed the dissection terminated in the proximal left common iliac artery. The diameter of the iliac arteries is unchanged from last study at about 23 mm.  Past Medical History  Diagnosis Date  . Chronic kidney disease   . Hypertension   . Stroke 2009  . History of pulmonary embolus (PE) 2010  . 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 Left ventricular hypertrophy     History  Substance Use Topics  . Smoking status: Former Smoker    Quit date: 01/25/1991  . Smokeless tobacco: Not on file  . Alcohol Use: No    Family History  Problem Relation Age of Onset  . Hypertension Mother   . Hypertension Father     No Known Allergies  Current outpatient prescriptions:aspirin (ANACIN) 81 MG EC tablet, Take 81 mg by mouth daily.  , Disp: , Rfl: ;  cloNIDine (CATAPRES) 0.2 MG tablet, Take 0.2 mg by mouth 3 (three) times daily.  , Disp: , Rfl: ;  hydrALAZINE (APRESOLINE) 50 MG tablet, Take 50 mg by mouth 3 (three) times daily.  , Disp: , Rfl: ;  labetalol (NORMODYNE) 300 MG tablet, Take 600 mg by mouth 2 (two) times daily.  , Disp: , Rfl:  pravastatin (PRAVACHOL) 20 MG tablet, Take 20 mg by mouth at bedtime.  , Disp: , Rfl: ;  Telmisartan-Amlodipine  (TWYNSTA) 80-10 MG TABS, Take 1 tablet by mouth daily.  , Disp: , Rfl: ;  warfarin (COUMADIN) 5 MG tablet, as directed.  , Disp: , Rfl:   BP 145/96  Pulse 67  Resp 16  Ht 6' (1.829 m)  Wt 327 lb (148.326 kg)  BMI 44.35 kg/m2  SpO2 99%  Body mass index is 44.35 kg/(m^2).        Review of Systems denies chest pain but does have dyspnea on exertion. Denies lower extremity claudication symptoms. Has had fairly good control of blood pressure recently. Has lost 20 pounds from a maximum of 347    Objective:   Physical Exam blood pressure 145/96 RA 67 respirations 16 General obese male no apparent distress alert and oriented x3 General well-developed well-nourished HEENT normal for age Lungs no rhonchi or wheezing Cardiovascular regular rhythm no murmurs carotid pulses 3+ no audible bruits Abdomen very obese textile mass is palpable approximating 5 cm in diameter Lower extremities 3+ femoral and posterior tibial pulses palpable bilaterally Neurologic normal Muscle skeletal free major deformities    Assessment:     Enlarging infrarenal aorta from previous aortic dissection and 2010-now 5.6 cm maximum diameter    Plan:     This does need to be repaired. I think he is candidate for aortic stent grafting with termination of dissection  and left common iliac artery. We'll review this further. He will be seeing Dr.Croituro on February 20 for further cardiac evaluation we will tentatively schedule him for Friday, March 1 for aortic stent grafting Risks and benefits were thoroughly discussed with patient and his wife they would like to proceed

## 2012-02-02 ENCOUNTER — Encounter: Payer: Self-pay | Admitting: *Deleted

## 2012-02-02 ENCOUNTER — Other Ambulatory Visit: Payer: Self-pay | Admitting: *Deleted

## 2012-02-02 ENCOUNTER — Encounter (HOSPITAL_COMMUNITY): Payer: Self-pay | Admitting: Pharmacy Technician

## 2012-02-05 MED ORDER — SODIUM CHLORIDE 0.9 % IV SOLN: INTRAVENOUS | Status: DC

## 2012-02-05 MED ORDER — DEXTROSE 5 % IV SOLN
1.5000 g | INTRAVENOUS | Status: DC
  Filled 2012-02-05: qty 1.5

## 2012-02-06 ENCOUNTER — Other Ambulatory Visit: Payer: Self-pay

## 2012-02-06 ENCOUNTER — Encounter (HOSPITAL_COMMUNITY)
Admission: RE | Disposition: A | Discharge status: Home / Self Care | Payer: Self-pay | Source: Ambulatory Visit | Attending: Surgery

## 2012-02-06 ENCOUNTER — Ambulatory Visit (HOSPITAL_COMMUNITY)
Admission: RE | Admit: 2012-02-06 | Discharge: 2012-02-06 | Disposition: A | Discharge status: Home / Self Care | Payer: Managed Care, Other (non HMO) | Source: Ambulatory Visit | Attending: Surgery | Admitting: Surgery

## 2012-02-06 DIAGNOSIS — N189 Chronic kidney disease, unspecified: Secondary | ICD-10-CM | POA: Insufficient documentation

## 2012-02-06 DIAGNOSIS — I714 Abdominal aortic aneurysm, without rupture, unspecified: Secondary | ICD-10-CM

## 2012-02-06 DIAGNOSIS — I7102 Dissection of abdominal aorta: Secondary | ICD-10-CM | POA: Insufficient documentation

## 2012-02-06 DIAGNOSIS — I129 Hypertensive chronic kidney disease with stage 1 through stage 4 chronic kidney disease, or unspecified chronic kidney disease: Secondary | ICD-10-CM | POA: Insufficient documentation

## 2012-02-06 HISTORY — PX: ABDOMINAL AORTAGRAM: SHX5454

## 2012-02-06 SURGERY — ABDOMINAL AORTAGRAM
Anesthesia: LOCAL

## 2012-02-06 MED ORDER — GUAIFENESIN-DM 100-10 MG/5ML PO SYRP: 15.0000 mL | ORAL_SOLUTION | ORAL | Status: DC | PRN

## 2012-02-06 MED ORDER — PHENOL 1.4 % MT LIQD: 1.0000 | OROMUCOSAL | Status: DC | PRN

## 2012-02-06 MED ORDER — ONDANSETRON HCL 4 MG/2ML IJ SOLN: 4.0000 mg | Freq: Four times a day (QID) | INTRAMUSCULAR | Status: DC | PRN

## 2012-02-06 MED ORDER — ACETAMINOPHEN 325 MG RE SUPP: 325.0000 mg | RECTAL | Status: DC | PRN

## 2012-02-06 MED ORDER — METOPROLOL TARTRATE 1 MG/ML IV SOLN: 2.0000 mg | INTRAVENOUS | Status: DC | PRN

## 2012-02-06 MED ORDER — HEPARIN (PORCINE) IN NACL 2-0.9 UNIT/ML-% IJ SOLN
INTRAMUSCULAR | Status: AC
  Filled 2012-02-06: qty 1000

## 2012-02-06 MED ORDER — ACETAMINOPHEN 325 MG PO TABS: 325.0000 mg | ORAL_TABLET | ORAL | Status: DC | PRN

## 2012-02-06 MED ORDER — LABETALOL HCL 5 MG/ML IV SOLN: 10.0000 mg | INTRAVENOUS | Status: DC | PRN

## 2012-02-06 MED ORDER — FENTANYL CITRATE 0.05 MG/ML IJ SOLN
INTRAMUSCULAR | Status: AC
  Filled 2012-02-06: qty 2

## 2012-02-06 MED ORDER — LIDOCAINE HCL (PF) 1 % IJ SOLN
INTRAMUSCULAR | Status: AC
  Filled 2012-02-06: qty 30

## 2012-02-06 MED ORDER — OXYCODONE-ACETAMINOPHEN 5-325 MG PO TABS: 1.0000 | ORAL_TABLET | ORAL | Status: DC | PRN

## 2012-02-06 MED ORDER — SODIUM CHLORIDE 0.9 % IV SOLN: INTRAVENOUS | Status: DC

## 2012-02-06 MED ORDER — HYDRALAZINE HCL 20 MG/ML IJ SOLN: 10.0000 mg | INTRAMUSCULAR | Status: DC | PRN

## 2012-02-06 MED ORDER — MIDAZOLAM HCL 2 MG/2ML IJ SOLN
INTRAMUSCULAR | Status: AC
  Filled 2012-02-06: qty 2

## 2012-02-06 NOTE — H&P (View-Only) (Signed)
Subjective:     Patient ID: [name], male   DOB: [birth year], 41 y.o.   MRN: [id number]  HPI this 41 year old male returns today for further discussion regarding his infrarenal aortic dissection with secondary aneurysm. I last saw him in June of 2011 at which time the aneurysm was 4.6 x 4.8 cm in maximum diameter. Since then he has had a cardiac catheterization which apparently looked good according to him. He has had no back or abdominal symptoms today he had a CT scan which are reviewed by computer. The maximum diameter of his infrarenal aortic aneurysm is now 5.6 cm. We were unable to assess the true and false lumens because of a creatinine of 2.56 and inability to give contrast. Previous CT scans have revealed the dissection terminated in the proximal left common iliac artery. The diameter of the iliac arteries is unchanged from last study at about 23 mm.  Past Medical History  Diagnosis Date  . Chronic kidney disease   . Hypertension   . Stroke 2009  . History of pulmonary embolus (PE) 2010  . 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 Left ventricular hypertrophy     History  Substance Use Topics  . Smoking status: Former Smoker    Quit date: 01/25/1991  . Smokeless tobacco: Not on file  . Alcohol Use: No    Family History  Problem Relation Age of Onset  . Hypertension Mother   . Hypertension Father     No Known Allergies  Current outpatient prescriptions:aspirin (ANACIN) 81 MG EC tablet, Take 81 mg by mouth daily.  , Disp: , Rfl: ;  cloNIDine (CATAPRES) 0.2 MG tablet, Take 0.2 mg by mouth 3 (three) times daily.  , Disp: , Rfl: ;  hydrALAZINE (APRESOLINE) 50 MG tablet, Take 50 mg by mouth 3 (three) times daily.  , Disp: , Rfl: ;  labetalol (NORMODYNE) 300 MG tablet, Take 600 mg by mouth 2 (two) times daily.  , Disp: , Rfl:  pravastatin (PRAVACHOL) 20 MG tablet, Take 20 mg by mouth at bedtime.  , Disp: , Rfl: ;  Telmisartan-Amlodipine  (TWYNSTA) 80-10 MG TABS, Take 1 tablet by mouth daily.  , Disp: , Rfl: ;  warfarin (COUMADIN) 5 MG tablet, as directed.  , Disp: , Rfl:   BP 145/96  Pulse 67  Resp 16  Ht 6' (1.829 m)  Wt 327 lb (148.326 kg)  BMI 44.35 kg/m2  SpO2 99%  Body mass index is 44.35 kg/(m^2).        Review of Systems denies chest pain but does have dyspnea on exertion. Denies lower extremity claudication symptoms. Has had fairly good control of blood pressure recently. Has lost 20 pounds from a maximum of 347    Objective:   Physical Exam blood pressure 145/96 RA 67 respirations 16 General obese male no apparent distress alert and oriented x3 General well-developed well-nourished HEENT normal for age Lungs no rhonchi or wheezing Cardiovascular regular rhythm no murmurs carotid pulses 3+ no audible bruits Abdomen very obese textile mass is palpable approximating 5 cm in diameter Lower extremities 3+ femoral and posterior tibial pulses palpable bilaterally Neurologic normal Muscle skeletal free major deformities    Assessment:     Enlarging infrarenal aorta from previous aortic dissection and 2010-now 5.6 cm maximum diameter    Plan:     This does need to be repaired. I think he is candidate for aortic stent grafting with termination of dissection  and left common iliac artery. We'll review this further. He will be seeing Dr.Croituro on February 20 for further cardiac evaluation we will tentatively schedule him for Friday, March 1 for aortic stent grafting Risks and benefits were thoroughly discussed with patient and his wife they would like to proceed

## 2012-02-06 NOTE — Progress Notes (Signed)
UP AND WALKED AND TOL WELL; RIGHT GROIN STABLE; NO BLEEDING OR HEMATOMA 

## 2012-02-06 NOTE — Interval H&P Note (Signed)
History and Physical Interval Note:  02/06/2012 11:40 AM  [name]  has presented today for surgery, with the diagnosis of aaa  The various methods of treatment have been discussed with the patient and family. After consideration of risks, benefits and other options for treatment, the patient has consented to  Procedure(s) (LRB): ABDOMINAL AORTAGRAM (N/A) as a surgical intervention .  The patients' history has been reviewed, patient examined, no change in status, stable for surgery.  I have reviewed the patients' chart and labs.  Questions were answered to the patient's satisfaction.     [name] IV, V. WELLS

## 2012-02-06 NOTE — Interval H&P Note (Signed)
History and Physical Interval Note:  02/06/2012 9:35 AM  [name]  has presented today for surgery, with the diagnosis of aaa  The various methods of treatment have been discussed with the patient and family. After consideration of risks, benefits and other options for treatment, the patient has consented to  Procedure(s) (LRB): ABDOMINAL AORTAGRAM (N/A) as a surgical intervention .  The patients' history has been reviewed, patient examined, no change in status, stable for surgery.  I have reviewed the patients' chart and labs.  Questions were answered to the patient's satisfaction.     Wisam Siefring IV, V. WELLS

## 2012-02-06 NOTE — Discharge Instructions (Signed)

## 2012-02-06 NOTE — Op Note (Signed)
[facility] [place]  Patient name: [name] MRN: [id number] DOB: 10/22/1971 Sex: male  02/06/2012 Pre-operative Diagnosis: Abdominal aortic aneurysm secondary to dissection Post-operative diagnosis:  Same Surgeon:  Eldridge Abrahams Procedure Performed:  1.  ultrasound access right femoral artery  2.  abdominal aortogram  3.  catheter and aorta x1     Indications:  This is a 41 year old gentleman who suffered a infrarenal aortic dissection which extends into his left common iliac artery. He has renal insufficiency. He comes in today for further evaluation with CO2 in anticipation of endovascular repair of his aneurysm   Procedure:  The patient was identified in the holding area and taken to room 8.  The patient was then placed supine on the table and prepped and draped in the usual sterile fashion.  A time out was called.  Ultrasound was used to evaluate the right common femoral artery.  It was patent .  A digital ultrasound image was acquired.  The right femoral artery was accessed under ultrasound guidance with an 18-gauge needle. An 035 wire was advanced into the aorta under fluoroscopic visualization. A 5 French sheath was placed. Over the wire an Omni flush catheter was placed at the level of L1 and an abdominal aortogram with CO2 was performed. Next the cath was pulled down to the aortic bifurcation and pelvic antrum was performed. Lateral images with the catheter at L1 were also obtained.  Findings:   Aortogram:  The visualized portions of the suprarenal abdominal aorta showed no significant pathology. There are single renal arteries bilaterally which are patent. There is aneurysmal changes and the dissection flap was then the infrarenal abdominal aorta at the level of the repair mesenteric artery. There was preferential filling of the false lumen. The iliac vessels appear to be widely patent bilaterally.    Impression:  #1  aneurysmal changes to the  infrarenal abdominal aorta with associated dissection. No significant stenosis is identified. There is an adequate infrarenal neck for stent graft.     Theotis Burrow, M.D. [facility] [place] Office: [phone] Pager:  [phone]

## 2012-02-07 LAB — POCT I-STAT, CHEM 8
Creatinine, Ser: 2.1 mg/dL — ABNORMAL HIGH (ref 0.50–1.35)
HCT: 35 % — ABNORMAL LOW (ref 39.0–52.0)
Hemoglobin: 11.9 g/dL — ABNORMAL LOW (ref 13.0–17.0)
Potassium: 4.5 mEq/L (ref 3.5–5.1)
Sodium: 142 mEq/L (ref 135–145)

## 2012-02-12 ENCOUNTER — Encounter (HOSPITAL_COMMUNITY): Payer: Self-pay | Admitting: Pharmacy Technician

## 2012-02-13 ENCOUNTER — Encounter (HOSPITAL_COMMUNITY): Payer: Self-pay | Admitting: Emergency Medicine

## 2012-02-13 ENCOUNTER — Emergency Department (HOSPITAL_COMMUNITY)
Admission: EM | Admit: 2012-02-13 | Discharge: 2012-02-14 | Disposition: A | Discharge status: Home Health | Payer: Managed Care, Other (non HMO) | Attending: Emergency Medicine | Admitting: Emergency Medicine

## 2012-02-13 DIAGNOSIS — N189 Chronic kidney disease, unspecified: Secondary | ICD-10-CM | POA: Insufficient documentation

## 2012-02-13 DIAGNOSIS — I129 Hypertensive chronic kidney disease with stage 1 through stage 4 chronic kidney disease, or unspecified chronic kidney disease: Secondary | ICD-10-CM | POA: Insufficient documentation

## 2012-02-13 DIAGNOSIS — Z8673 Personal history of transient ischemic attack (TIA), and cerebral infarction without residual deficits: Secondary | ICD-10-CM | POA: Insufficient documentation

## 2012-02-13 DIAGNOSIS — M7989 Other specified soft tissue disorders: Secondary | ICD-10-CM | POA: Insufficient documentation

## 2012-02-13 DIAGNOSIS — Z86718 Personal history of other venous thrombosis and embolism: Secondary | ICD-10-CM | POA: Insufficient documentation

## 2012-02-13 DIAGNOSIS — Y849 Medical procedure, unspecified as the cause of abnormal reaction of the patient, or of later complication, without mention of misadventure at the time of the procedure: Secondary | ICD-10-CM | POA: Insufficient documentation

## 2012-02-13 DIAGNOSIS — E669 Obesity, unspecified: Secondary | ICD-10-CM | POA: Insufficient documentation

## 2012-02-13 DIAGNOSIS — Z79899 Other long term (current) drug therapy: Secondary | ICD-10-CM | POA: Insufficient documentation

## 2012-02-13 DIAGNOSIS — T82898A Other specified complication of vascular prosthetic devices, implants and grafts, initial encounter: Secondary | ICD-10-CM | POA: Insufficient documentation

## 2012-02-13 HISTORY — DX: Abdominal aortic aneurysm, without rupture, unspecified: I71.40

## 2012-02-13 HISTORY — DX: Abdominal aortic aneurysm, without rupture: I71.4

## 2012-02-13 MED ORDER — ENOXAPARIN SODIUM 150 MG/ML ~~LOC~~ SOLN
150.0000 mg | SUBCUTANEOUS | Status: AC
  Administered 2012-02-14: 150 mg via SUBCUTANEOUS
  Filled 2012-02-13: qty 1

## 2012-02-13 NOTE — ED Notes (Signed)
Patient to [facility] ED with C/O his right leg swelling.  States that he had an angiogram a few days ago. States that he has been up a lot today and he noticed his leg swelling.  RN notes that there is a large ecchymotic area from his mid thigh to his medial mid thigh. No firmness noted. Right DP Pulse is 3+. A small non-pulsatile mass noted at puncture site.  Right leg is significantly larger than the left.  Patient denies back or abdominal pain.

## 2012-02-13 NOTE — Discharge Instructions (Signed)
Return in the morning as scheduled for the vascular Doppler of the right leg. Follow up with your vascular surgeon and cardiologist tomorrow. Return if needed for problems.

## 2012-02-13 NOTE — ED Provider Notes (Signed)
History     CSN: [id number]  Arrival date & time 02/13/12  1737   First MD Initiated Contact with Patient 02/13/12 2247      Chief Complaint  Patient presents with  . Leg Swelling    (Consider location/radiation/quality/duration/timing/severity/associated sxs/prior treatment) HPI [name] is a 41 y.o. male who presents with right leg. Swelling that started today. He states he had an angiogram done one week ago to be evaluated for aortic aneurysm repair. He apparently has aortic aneurysm, secondary to an abdominal aortic dissection. He denies pain or dysesthesia in the right leg. His     Past Medical History  Diagnosis Date  . Chronic kidney disease   . Hypertension   . Stroke 2009  . History of pulmonary embolus (PE) 2010  . 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 Left ventricular hypertrophy   . Abdominal aortic aneurysm     Past Surgical History  Procedure Date  . Cardiac catheterization     Family History  Problem Relation Age of Onset  . Hypertension Mother   . Hypertension Father     History  Substance Use Topics  . Smoking status: Former Smoker    Quit date: 01/25/1991  . Smokeless tobacco: Not on file  . Alcohol Use: No      Review of Systems  All other systems reviewed and are negative.    Allergies  Review of patient's allergies indicates no known allergies.  Home Medications   Current Outpatient Rx  Name Route Sig Dispense Refill  . CLONIDINE HCL 0.2 MG PO TABS Oral Take 0.2 mg by mouth 2 (two) times daily.     [name] HYDRALAZINE HCL 50 MG PO TABS Oral Take 50 mg by mouth 2 (two) times daily.     [name] LABETALOL HCL 300 MG PO TABS Oral Take 300-600 mg by mouth 2 (two) times daily. Pt states he takes 300mg  or 600mg  twice daily depending on blood pressure.    [name] PRAVASTATIN SODIUM 20 MG PO TABS Oral Take 20 mg by mouth at bedtime.        BP 140/89  Pulse 83  Temp(Src) 98.6 F (37 C) (Oral)  Resp 20   SpO2 96%  Physical Exam  Nursing note and vitals reviewed. Constitutional: He is oriented to person, place, and time. He appears well-developed and well-nourished.       He is obese  HENT:  Head: Normocephalic and atraumatic.  Right Ear: External ear normal.  Left Ear: External ear normal.  Eyes: Conjunctivae and EOM are normal. Pupils are equal, round, and reactive to light.  Neck: Normal range of motion and phonation normal. Neck supple.  Cardiovascular: Normal rate, regular rhythm, normal heart sounds and intact distal pulses.   Pulmonary/Chest: Effort normal and breath sounds normal. He exhibits no bony tenderness.  Abdominal: Soft. Normal appearance. There is no tenderness.  Musculoskeletal: Normal range of motion.       Right groin, mildly tender with a 2 x 4 cm area of localized swelling consistent with a subcutaneous hematoma. There is diffuse, flat, ecchymosis of the right groin extending from the lateral hip area to the medial upper thigh. The femoral pulse could not be palpated. The right calf is 4 cm, larger than the left at 20 cm below the patella. He has excellent palpable pulse in the dorsalis pedis bilaterally. His sensation is normal in the feet bilaterally  Neurological: He is alert and oriented to  person, place, and time. He has normal strength. No cranial nerve deficit or sensory deficit. He exhibits normal muscle tone. Coordination normal.  Skin: Skin is warm, dry and intact.  Psychiatric: He has a normal mood and affect. His behavior is normal. Judgment and thought content normal.    ED Course  Procedures (including critical care time) Case is discussed with Dr. Oneida Alar. He advises treating the patient with Lovenox and scanning for venous thrombus in the right leg, tomorrow.      Labs Reviewed - No data to display No results found.   1. Leg swelling       MDM  Localized right leg swelling, status post angiogram. No apparent arterial insufficiency. Patient  has mild renal insufficiency. He is treated with low-dose Lovenox and referred for vascular Doppler in the morning.         Richarda Blade, MD 02/13/12 [phone]

## 2012-02-13 NOTE — ED Notes (Addendum)
Patient had an angiogram on the 12th of this month (insertion site was in right groin area); patient states that he has been on his legs a lot today; patient reporting of right leg pain, swelling, and tightness that extends from his thigh to his ankle.  Patient called primary care physician and was told to come to the ED.  Patient denies shortness of breath and chest pain.

## 2012-02-13 NOTE — ED Notes (Signed)
Vascular called per Dr. Eulis Foster.

## 2012-02-14 ENCOUNTER — Ambulatory Visit (HOSPITAL_COMMUNITY)
Admission: RE | Admit: 2012-02-14 | Discharge: 2012-02-14 | Disposition: A | Discharge status: Home / Self Care | Payer: Managed Care, Other (non HMO) | Source: Ambulatory Visit | Attending: Emergency Medicine | Admitting: Emergency Medicine

## 2012-02-14 ENCOUNTER — Telehealth: Payer: Self-pay | Admitting: *Deleted

## 2012-02-14 ENCOUNTER — Emergency Department (HOSPITAL_COMMUNITY)
Admission: EM | Admit: 2012-02-14 | Discharge: 2012-02-14 | Disposition: A | Discharge status: Home Health | Payer: Managed Care, Other (non HMO) | Attending: Family Medicine | Admitting: Family Medicine

## 2012-02-14 ENCOUNTER — Encounter: Payer: Self-pay | Admitting: *Deleted

## 2012-02-14 ENCOUNTER — Encounter (HOSPITAL_COMMUNITY): Payer: Self-pay | Admitting: *Deleted

## 2012-02-14 DIAGNOSIS — R609 Edema, unspecified: Secondary | ICD-10-CM

## 2012-02-14 DIAGNOSIS — M79609 Pain in unspecified limb: Secondary | ICD-10-CM | POA: Insufficient documentation

## 2012-02-14 DIAGNOSIS — I714 Abdominal aortic aneurysm, without rupture, unspecified: Secondary | ICD-10-CM

## 2012-02-14 DIAGNOSIS — Z79899 Other long term (current) drug therapy: Secondary | ICD-10-CM | POA: Insufficient documentation

## 2012-02-14 DIAGNOSIS — Z7982 Long term (current) use of aspirin: Secondary | ICD-10-CM | POA: Insufficient documentation

## 2012-02-14 DIAGNOSIS — R52 Pain, unspecified: Secondary | ICD-10-CM

## 2012-02-14 DIAGNOSIS — Y849 Medical procedure, unspecified as the cause of abnormal reaction of the patient, or of later complication, without mention of misadventure at the time of the procedure: Secondary | ICD-10-CM | POA: Insufficient documentation

## 2012-02-14 DIAGNOSIS — I129 Hypertensive chronic kidney disease with stage 1 through stage 4 chronic kidney disease, or unspecified chronic kidney disease: Secondary | ICD-10-CM | POA: Insufficient documentation

## 2012-02-14 DIAGNOSIS — I82409 Acute embolism and thrombosis of unspecified deep veins of unspecified lower extremity: Secondary | ICD-10-CM

## 2012-02-14 DIAGNOSIS — Z86718 Personal history of other venous thrombosis and embolism: Secondary | ICD-10-CM | POA: Insufficient documentation

## 2012-02-14 DIAGNOSIS — Z8673 Personal history of transient ischemic attack (TIA), and cerebral infarction without residual deficits: Secondary | ICD-10-CM | POA: Insufficient documentation

## 2012-02-14 DIAGNOSIS — M7989 Other specified soft tissue disorders: Secondary | ICD-10-CM | POA: Insufficient documentation

## 2012-02-14 DIAGNOSIS — N289 Disorder of kidney and ureter, unspecified: Secondary | ICD-10-CM

## 2012-02-14 DIAGNOSIS — S7010XA Contusion of unspecified thigh, initial encounter: Secondary | ICD-10-CM | POA: Insufficient documentation

## 2012-02-14 DIAGNOSIS — N189 Chronic kidney disease, unspecified: Secondary | ICD-10-CM | POA: Insufficient documentation

## 2012-02-14 LAB — POCT I-STAT, CHEM 8
Chloride: 112 mEq/L (ref 96–112)
HCT: 31 % — ABNORMAL LOW (ref 39.0–52.0)
Potassium: 4.6 mEq/L (ref 3.5–5.1)

## 2012-02-14 LAB — DIFFERENTIAL
Lymphs Abs: 1.3 10*3/uL (ref 0.7–4.0)
Monocytes Relative: 10 % (ref 3–12)
Neutro Abs: 4.9 10*3/uL (ref 1.7–7.7)
Neutrophils Relative %: 68 % (ref 43–77)

## 2012-02-14 LAB — CBC
Hemoglobin: 10.2 g/dL — ABNORMAL LOW (ref 13.0–17.0)
RBC: 3.77 MIL/uL — ABNORMAL LOW (ref 4.22–5.81)

## 2012-02-14 LAB — PROTIME-INR: INR: 1.2 (ref 0.00–1.49)

## 2012-02-14 LAB — APTT: aPTT: 41 seconds — ABNORMAL HIGH (ref 24–37)

## 2012-02-14 MED ORDER — RIVAROXABAN 15 MG PO TABS: 15.0000 mg | ORAL_TABLET | Freq: Two times a day (BID) | ORAL | Status: DC

## 2012-02-14 MED ORDER — OXYCODONE-ACETAMINOPHEN 5-325 MG PO TABS: 2.0000 | ORAL_TABLET | ORAL | Status: DC | PRN

## 2012-02-14 MED ORDER — RIVAROXABAN 20 MG PO TABS: 1.0000 | ORAL_TABLET | Freq: Every day | ORAL | Status: DC

## 2012-02-14 MED ORDER — ENOXAPARIN SODIUM 150 MG/ML ~~LOC~~ SOLN
150.0000 mg | SUBCUTANEOUS | Status: AC
  Administered 2012-02-14: 150 mg via SUBCUTANEOUS
  Filled 2012-02-14 (×2): qty 1

## 2012-02-14 NOTE — ED Notes (Signed)
Admitting md in to see pt.  

## 2012-02-14 NOTE — Telephone Encounter (Signed)
Patient called at 4:30 stating that he was having swelling and pain, hardness in groin s/p aortogram 02/06/12. Patient is scheduled for AAA stent graft on 02/23/12. After discussing with team Susa Day, patient was instructed to ED.

## 2012-02-14 NOTE — ED Notes (Signed)
Patient sent here from vascular lab for treatment of positive DVT on the right  Inner thigh area.

## 2012-02-14 NOTE — ED Notes (Signed)
Patient denies pain and is resting comfortably at rest.

## 2012-02-14 NOTE — Telephone Encounter (Signed)
PA required for Xarelto. Form placed in MD box. Paged Dr. Vallarie Mare to notify.

## 2012-02-14 NOTE — Progress Notes (Signed)
*  PRELIMINARY RESULTS* Vascular Ultrasound Lower extremity venous duplex has been completed.  Preliminary findings: Right= Evidence of deep vein thrombus in the common femoral and proximal femoral veins. Superficial thrombus of the greater saphenous vein.  Left= No evidence of deep vein thrombus.  Landry Mellow RDMS 02/14/2012, 9:38 AM

## 2012-02-14 NOTE — Progress Notes (Signed)
[facility]  Patient seen and examined.  Full consult to follow.  Patient is s/p right common femoral artery cannulation and diagnostic aortogram on 12 FEB 13.  The patient now has R femoral DVT without evidence of phlegmasia.  He has echymosis in the right thigh, which likely related some prior hematoma in the right thigh after the right common femoral artery cannulation.  After 8 days, it is highly unlikely there is continued bleeding from the artery cannulation.   The echymosis is just gravity related migration of the hematoma as the medial thigh is soft and without any signs of active bleeding.  I would proceed with anticoagulation with Heparin or Lovenox with eventually Coumadin use.  The only confounding factor is the EVAR scheduled for early March.  At this point, I think the DVT takes priority.  The EVAR is an elective repair so it can be delayed if needed.  Adele Barthel, MD [facility] [place] Office: [phone] Pager: [phone]  02/14/2012, 1:02 PM

## 2012-02-14 NOTE — Consult Note (Signed)
[name] is an 41 y.o. male.   Chief Complaint:  Right leg swelling HPI:  Patient is a 41 y/o aam with hx of DVT in the past who presents with 8 days of right leg swelling s/p angiogram accessed through the right femoral artery. Patient has ecchymosis of the right thigh. He has normal neurovascular exam of the right LE. He has trace edema of the right lower ext. His angiogram was uneventful without postoperative bleeding. The patient takes asa 162 mg daily.  He also has a history of a infrarenal triple AAA with dissection. 5.3 cm. He is planned for an EVAR with Dr. Kellie Simmering.  Patient was evaluated in the ED by Dr. Bridgett Larsson (vascular). He was seen this morning and an ultrasound was performed revealing DVT in the superficial femoral, profunda, superficial saphenous.  The patient denies tachycardia, palpitations, sob, cough, hemoptysis, dizziness, fall.    Past Medical History  Diagnosis Date  . Chronic kidney disease   . Hypertension   . Stroke 2009  . History of pulmonary embolus (PE) 2010  . 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 Left ventricular hypertrophy   . Abdominal aortic aneurysm     Past Surgical History  Procedure Date  . Cardiac catheterization     Family History  Problem Relation Age of Onset  . Hypertension Mother   . Hypertension Father    Social History:  reports that he quit smoking about 21 years ago. He does not have any smokeless tobacco history on file. He reports that he does not drink alcohol or use illicit drugs.  Allergies: No Known Allergies  Medications Prior to Admission  Medication Dose Route Frequency Provider Last Rate Last Dose  . enoxaparin (LOVENOX) injection 150 mg  150 mg Subcutaneous To Major Richarda Blade, MD   150 mg at 02/14/12 0002   Medications Prior to Admission  Medication Sig Dispense Refill  . cloNIDine (CATAPRES) 0.2 MG tablet Take 0.2 mg by mouth 2 (two) times daily.       . hydrALAZINE  (APRESOLINE) 50 MG tablet Take 50 mg by mouth 2 (two) times daily.       [name] labetalol (NORMODYNE) 300 MG tablet Take 300-600 mg by mouth 2 (two) times daily. Pt states he takes 300mg  or 600mg  twice daily depending on blood pressure.        Results for orders placed during the hospital encounter of 02/14/12 (from the past 48 hour(s))  CBC     Status: Abnormal   Collection Time   02/14/12 11:00 AM      Component Value Range Comment   WBC 7.2  4.0 - 10.5 (K/uL)    RBC 3.77 (*) 4.22 - 5.81 (MIL/uL)    Hemoglobin 10.2 (*) 13.0 - 17.0 (g/dL)    HCT 31.5 (*) 39.0 - 52.0 (%)    MCV 83.6  78.0 - 100.0 (fL)    MCH 27.1  26.0 - 34.0 (pg)    MCHC 32.4  30.0 - 36.0 (g/dL)    RDW 12.7  11.5 - 15.5 (%)    Platelets 160  150 - 400 (K/uL)   DIFFERENTIAL     Status: Normal   Collection Time   02/14/12 11:00 AM      Component Value Range Comment   Neutrophils Relative 68  43 - 77 (%)    Neutro Abs 4.9  1.7 - 7.7 (K/uL)    Lymphocytes Relative 18  12 -  46 (%)    Lymphs Abs 1.3  0.7 - 4.0 (K/uL)    Monocytes Relative 10  3 - 12 (%)    Monocytes Absolute 0.7  0.1 - 1.0 (K/uL)    Eosinophils Relative 4  0 - 5 (%)    Eosinophils Absolute 0.3  0.0 - 0.7 (K/uL)    Basophils Relative 0  0 - 1 (%)    Basophils Absolute 0.0  0.0 - 0.1 (K/uL)   PROTIME-INR     Status: Abnormal   Collection Time   02/14/12 11:00 AM      Component Value Range Comment   Prothrombin Time 15.5 (*) 11.6 - 15.2 (seconds)    INR 1.20  0.00 - 1.49    APTT     Status: Abnormal   Collection Time   02/14/12 11:00 AM      Component Value Range Comment   aPTT 41 (*) 24 - 37 (seconds)   POCT I-STAT, CHEM 8     Status: Abnormal   Collection Time   02/14/12 11:18 AM      Component Value Range Comment   Sodium 141  135 - 145 (mEq/L)    Potassium 4.6  3.5 - 5.1 (mEq/L)    Chloride 112  96 - 112 (mEq/L)    BUN 27 (*) 6 - 23 (mg/dL)    Creatinine, Ser 2.10 (*) 0.50 - 1.35 (mg/dL)    Glucose, Bld 102 (*) 70 - 99 (mg/dL)    Calcium, Ion  1.24  1.12 - 1.32 (mmol/L)    TCO2 21  0 - 100 (mmol/L)    Hemoglobin 10.5 (*) 13.0 - 17.0 (g/dL)    HCT 31.0 (*) 39.0 - 52.0 (%)    No results found.  ROS Pertinent items are noted in HPI. No fever, chills, night sweats, weight loss.  Blood pressure 139/97, pulse 67, temperature 97.6 F (36.4 C), temperature source Oral, resp. rate 16, SpO2 96.00%. Physical Exam  Lungs:  Normal respiratory effort, chest expands symmetrically. Lungs are clear to auscultation, no crackles or wheezes. Heart - Regular rate and rhythm.  No murmurs, gallops or rubs.    Abdomen: soft and non-tender without masses, organomegaly or hernias noted.  No guarding or rebound. Unable to auscultate a bruit at the aorta. Right Lower ext: superficial old ecchymosis. No palpable mass/hematoma. Very obese leg. Palpable femoral pulse. DP/PT intact. Negative Homens sign. Trace edema. He has a well healing scar from vascular access cut down.  General: obese, aam, nad.  Assessment/Plan Patient is a 41 y/o aam with dvt of the right femoral veins and lower ext. Swelling with superficial ecchymosis and no underlying hematoma.  1. DVT Hx of dvt/PE in the past without known cause. He was treated with coumadin for 6 months. No smoking, no steroids, no hypercoag. workup. There is a clear cause for this DVT - recent vascular surgery followed by immobility. Will give Lovenox 1.5 mg/kg in ED today. He will start 15 mg BID of Xeralto daily and transition to 15 mg daily. I will plan on keeping him on anticoagulation for 6 months and consider life long therapy. I will see him tomorrow morning at 830 am in the family practice clinic. He will schedule an appointment to see his vascular surgeon ASAP This was discussed with dr. Bridgett Larsson with vascular surgery and he approved. Also discussed with Dr. Nori Riis with family medicine.  2. AAA with dissection Normotensive without vital instability Planned EVAR with vascular  3. Dispo: Home  now, with  close follow up.  Emmali Karow 02/14/2012, 2:43 PM

## 2012-02-14 NOTE — ED Notes (Signed)
Upon re-eval by admitting md, pt now to be d/c'd home after dose of lovenox.

## 2012-02-14 NOTE — ED Provider Notes (Signed)
History     CSN: [id number]  Arrival date & time 02/14/12  [id number]   First MD Initiated Contact with Patient 02/14/12 1011      Chief Complaint  Patient presents with  . DVT    (Consider location/radiation/quality/duration/timing/severity/associated sxs/prior treatment) Patient is a 41 y.o. male presenting with leg pain. The history is provided by the patient.  Leg Pain  Injury mechanism: He had an angiocath about a week ago and has developed right thigh pain and swelling adjacent to catheter entry point.  The pain is present in the right thigh. The pain is mild. The pain has been constant since onset. Associated symptoms comments: He had the angio cath as a part of pre-surgical evaluation for AAA repair to be done by Dr. Kellie Simmering. He has a history of previous DVT with associated PE over 2 years, not on chronic coumadin therapy. He denies SOB,or chest pain today. He was seen last night in the ED and returned this morning for a doppler study of the right leg. He was referred back to ED by vascular lab with a positive finding..    Past Medical History  Diagnosis Date  . Chronic kidney disease   . Hypertension   . Stroke 2009  . History of pulmonary embolus (PE) 2010  . 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 Left ventricular hypertrophy   . Abdominal aortic aneurysm     Past Surgical History  Procedure Date  . Cardiac catheterization     Family History  Problem Relation Age of Onset  . Hypertension Mother   . Hypertension Father     History  Substance Use Topics  . Smoking status: Former Smoker    Quit date: 01/25/1991  . Smokeless tobacco: Not on file  . Alcohol Use: No      Review of Systems  Constitutional: Negative for fever and chills.  HENT: Negative.   Respiratory: Negative.  Negative for shortness of breath.   Cardiovascular: Negative.  Negative for chest pain.  Gastrointestinal: Negative.   Musculoskeletal: Negative.    See HPI  Skin: Negative.   Neurological: Negative.     Allergies  Review of patient's allergies indicates no known allergies.  Home Medications   Current Outpatient Rx  Name Route Sig Dispense Refill  . ASPIRIN 81 MG PO TABS Oral Take 162 mg by mouth daily.    [name] CLONIDINE HCL 0.2 MG PO TABS Oral Take 0.2 mg by mouth 2 (two) times daily.     [name] HYDRALAZINE HCL 50 MG PO TABS Oral Take 50 mg by mouth 2 (two) times daily.     [name] LABETALOL HCL 300 MG PO TABS Oral Take 300-600 mg by mouth 2 (two) times daily. Pt states he takes 300mg  or 600mg  twice daily depending on blood pressure.      BP 140/93  Pulse 74  Temp(Src) 97.6 F (36.4 C) (Oral)  Resp 18  SpO2 96%  Physical Exam  Constitutional: He appears well-developed and well-nourished.  HENT:  Head: Normocephalic.  Neck: Normal range of motion. Neck supple.  Cardiovascular: Normal rate and regular rhythm.   Pulmonary/Chest: Effort normal and breath sounds normal.  Abdominal: Soft. Bowel sounds are normal. There is no tenderness. There is no rebound and no guarding.  Musculoskeletal: Normal range of motion. He exhibits tenderness.       Right thigh proximally has large hematoma anteromedially. Moderately tender. Distal pulses present.   Neurological: He is alert. No  cranial nerve deficit.  Skin: Skin is warm and dry. No rash noted.  Psychiatric: He has a normal mood and affect.    ED Course  Procedures (including critical care time)  Labs Reviewed  CBC - Abnormal; Notable for the following:    RBC 3.77 (*)    Hemoglobin 10.2 (*)    HCT 31.5 (*)    All other components within normal limits  PROTIME-INR - Abnormal; Notable for the following:    Prothrombin Time 15.5 (*)    All other components within normal limits  APTT - Abnormal; Notable for the following:    aPTT 41 (*)    All other components within normal limits  POCT I-STAT, CHEM 8 - Abnormal; Notable for the following:    BUN 27 (*)    Creatinine, Ser 2.10 (*)      Glucose, Bld 102 (*)    Hemoglobin 10.5 (*)    HCT 31.0 (*)    All other components within normal limits  DIFFERENTIAL   No results found.   No diagnosis found.  Dr. Alvino Chapel in to evaluate patient. Admission arranged with medicine after consult with vascular surgeon.  MDM  [facility] evaluation and coordinated care with Dr. Bridgett Larsson of vascular surgery. To be discharged home, given Lovenox and will be seen by [employer] in the morning in the clinic for further treatment.       Leotis Shames, PA-C 02/14/12 1204  Leotis Shames, PA-C 02/14/12 1511

## 2012-02-14 NOTE — Progress Notes (Addendum)
ANTICOAGULATION CONSULT NOTE - Initial Consult  Pharmacy Consult for Lovenox Indication: DVT  No Known Allergies  Patient Measurements:   Wt = 323 lbs = 146 kg  Vital Signs: Temp: 97.6 F (36.4 C) (02/20 0936) Temp src: Oral (02/20 0936) BP: 139/97 mmHg (02/20 1150) Pulse Rate: 67  (02/20 1150)  Labs:  Basename 02/14/12 1118 02/14/12 1100  HGB 10.5* 10.2*  HCT 31.0* 31.5*  PLT -- 160  APTT -- 41*  LABPROT -- 15.5*  INR -- 1.20  HEPARINUNFRC -- --  CREATININE 2.10* --  CKTOTAL -- --  CKMB -- --  TROPONINI -- --   The CrCl is unknown because both a height and weight (above a minimum accepted value) are required for this calculation.  Medical History: Past Medical History  Diagnosis Date  . Chronic kidney disease   . Hypertension   . Stroke 2009  . History of pulmonary embolus (PE) 2010  . 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 Left ventricular hypertrophy   . Abdominal aortic aneurysm     Medications:  Medications Prior to Admission  Medication Dose Route Frequency Provider Last Rate Last Dose  . enoxaparin (LOVENOX) injection 150 mg  150 mg Subcutaneous To Major Richarda Blade, MD   150 mg at 02/14/12 0002   Medications Prior to Admission  Medication Sig Dispense Refill  . cloNIDine (CATAPRES) 0.2 MG tablet Take 0.2 mg by mouth 2 (two) times daily.       . hydrALAZINE (APRESOLINE) 50 MG tablet Take 50 mg by mouth 2 (two) times daily.       [name] labetalol (NORMODYNE) 300 MG tablet Take 300-600 mg by mouth 2 (two) times daily. Pt states he takes 300mg  or 600mg  twice daily depending on blood pressure.        Assessment: 41 yo male with recent  Angiogram admitted with new DVT.  CBC reduced, but OK for lovenox initation.  No anticoagulation PTA.  Received a dose of Lovenox 150 mg last night at 0002.  Due for next dose now.    Plan:  1. Lovenox 150 mg SQ BID.  Will give 1st dose now (3 PM), then will need lovenox dose at 3  AM. 2. Plans for Xarelto to start after AM MD appt.  1st dose will be due at 3 PM.  Lorrin Bodner C 02/14/2012,2:25 PM

## 2012-02-14 NOTE — ED Provider Notes (Signed)
Medical screening examination/treatment/procedure(s) were performed by non-physician practitioner and as supervising physician I was immediately available for consultation/collaboration.  Jasper Riling. Alvino Chapel, MD 02/14/12 1531

## 2012-02-14 NOTE — Discharge Instructions (Signed)
FOLLOW UP PER SCHEDULED APPOINTMENT IN THE MORNING WITH FAMILY PRACTICE. RETURN HERE AS NEEDED.  Deep Vein Thrombosis A deep vein thrombosis (DVT) is a blood clot (thrombus) that develops in a deep vein. A DVT is a clot in the deep, larger veins of the leg, arm, or pelvis. These are more dangerous than clots that might form in veins on the surface of the body. Deep vein thrombosis can lead to complications if the clot breaks off and travels in the bloodstream to the lungs. CAUSES Blood clots form in a vein for different reasons. Usually several things cause blood clots. They include:  The flow of blood slows down.   The inside of the vein is damaged in some way.   The person has a condition that makes blood clot more easily. These conditions may include:   Older age (especially over 42 years old).   Having a history of blood clots.   Having major or lengthy surgery. Hip surgery is particularly high-risk.   Breaking a hip or leg.   Sitting or lying still for a long time.   Cancer or cancer treatment.   Having a long, thin tube (catheter) placed inside a vein during a medical procedure.   Being overweight (obese).   Pregnancy and childbirth.   Medicines with estrogen.   Smoking.   Other circulation or heart problems.  SYMPTOMS When a clot forms, it can either partially or totally block the blood flow in that vein. Symptoms of a DVT can include:  Swelling of the leg or arm, especially if one side is much worse.   Warmth and redness of the leg or arm, especially if one side is much worse.   Pain in an arm or leg. If the clot is in the leg, symptoms may be more noticeable or worse when standing or walking.  If the blood clot travels to the lung, it may cause:  Shortness of breath.   Chest pain. The pain may be worsened by deep breaths.   Coughing up thick mucus (phlegm), possibly flecked with blood.  Anyone with these symptoms should get emergency medical treatment right  away. Call your local emergency services (911 in [place] if you have these symptoms. DIAGNOSIS If a DVT is suspected, your caregiver will take a full medical history. He or she will also perform a physical exam. Tests that also may be required include:  Studies of the clotting properties of the blood.   An ultrasound scan.   X-rays to show the flow of blood when special dye is injected into the veins (venography).   Studies of your lungs if you have any chest symptoms.  PREVENTION  Exercise the legs regularly. Take a brisk 30 minute walk every day.   Maintain a weight that is appropriate for your height.   Avoid sitting or lying in bed for long periods of time without moving your legs.   Women, particularly those over the age of 33, should consider the risks and benefits of taking estrogen medicines, including birth control pills.   Do not smoke, especially if you take estrogen medicines.   Long-distance travel can increase your risk. You should exercise your legs by walking or pumping the muscles every hour.   In hospital prevention:   Prevention may include medical and nonmedical measures.  TREATMENT  The most common treatment for DVT is blood thinning (anticoagulant) medicine, which reduces the blood's tendency to clot. Anticoagulants can stop new blood clots from forming and old  ones from growing. They cannot dissolve existing clots. Your body does this by itself over time. Anticoagulants can be given by mouth, by intravenous (IV) access, or by injection. Your caregiver will determine the best program for you.   Less commonly, clot-dissolving drugs (thrombolytics) are used to dissolve a DVT. They carry a high risk of bleeding, so they are used mainly in severe cases.   Very rarely, a blood clot in the leg needs to be removed surgically.   If you are unable to take anticoagulants, your caregiver may arrange for you to have a filter placed in a main vein in your belly (abdomen).  This filter prevents clots from traveling to your lungs.  HOME CARE INSTRUCTIONS  Take all medicines prescribed by your caregiver. Follow the directions carefully.   You will most likely continue taking anticoagulants after you leave the hospital. Your caregiver will advise you on the length of treatment (usually 3 to 6 months, sometimes for life).   Taking too much or too little of an anticoagulant is dangerous. While taking this type of medicine, you will need to have regular blood tests to be sure the dose is correct. The dose can change for many reasons. It is critically important that you take this medicine exactly as prescribed, and that you have blood tests exactly as directed.   Many foods can interfere with anticoagulants. These include foods high in vitamin K, such as spinach, kale, broccoli, cabbage, collard and turnip greens, Brussels sprouts, peas, cauliflower, seaweed, parsley, beef and pork liver, green tea, and soybean oil. Your caregiver should discuss limits on these foods with you or you should arrange a visit with a dietician to answer your questions.   Many medicines can interfere with anticoagulants. You must tell your caregiver about any and all medicines you take. This includes all vitamins and supplements. Be especially cautious with aspirin and anti-inflammatory medicines. Ask your caregiver before taking these.   Anticoagulants can have side effects, mostly excessive bruising or bleeding. You will need to hold pressure over cuts for longer than usual. Avoid alcoholic drinks or consume only very small amounts while taking this medicine.   If you are taking an anticoagulant:   Wear a medical alert bracelet.   Notify your dentist or other caregivers before procedures.   Avoid contact sports.   Ask your caregiver how soon you can go back to normal activities. Not being active can lead to new clots. Ask for a list of what you should and should not do.   Exercise your  lower leg muscles. This is important while traveling.   You may need to wear compression stockings. These are tight elastic stockings that apply pressure to the lower legs. This can help keep the blood in the legs from clotting.   If you are a smoker, you should quit.   Learn as much as you can about DVT.  SEEK MEDICAL CARE IF:  You have unusual bruising or any bleeding problems.   The swelling or pain in your affected arm or leg is not gradually improving.   You anticipate surgery or long-distance travel. You should get specific advice on DVT prevention.   You discover other family members with blood clots. This may require further testing for inherited diseases or conditions.  SEEK IMMEDIATE MEDICAL CARE IF:  You develop chest pain.   You develop severe shortness of breath.   You begin to cough up bloody mucus or phlegm (sputum).   You feel dizzy  or faint.   You develop swelling or pain in the leg.   You have breathing problems after traveling.  MAKE SURE YOU:  Understand these instructions.   Will watch your condition.   Will get help right away if you are not doing well or get worse.  Document Released: 12/11/2005 Document Revised: 08/23/2011 Document Reviewed: 02/02/2011 [facility] Patient Information 2012 [place].

## 2012-02-14 NOTE — ED Notes (Signed)
Pt presents to dept with pos. Vascular study for rt dvt. Pt states noticing pain and swelling to rt leg yesterday. Pt had angiogram study last week to assess pt's AAA for repair. Pt has bruising to rt upper leg and groin from that procedure but pain and swelling became noticeable yesterday. Pt denies pain at rest. Denies sob/cp. Pt in no acute distress.

## 2012-02-15 ENCOUNTER — Encounter: Payer: Self-pay | Admitting: Family Medicine

## 2012-02-15 ENCOUNTER — Ambulatory Visit (INDEPENDENT_AMBULATORY_CARE_PROVIDER_SITE_OTHER): Payer: Managed Care, Other (non HMO) | Admitting: Family Medicine

## 2012-02-15 VITALS — BP 158/94 | HR 66 | Temp 97.6°F | Ht 72.0 in | Wt 338.0 lb

## 2012-02-15 DIAGNOSIS — I824Y9 Acute embolism and thrombosis of unspecified deep veins of unspecified proximal lower extremity: Secondary | ICD-10-CM

## 2012-02-15 DIAGNOSIS — Z86718 Personal history of other venous thrombosis and embolism: Secondary | ICD-10-CM | POA: Insufficient documentation

## 2012-02-15 NOTE — Patient Instructions (Signed)
It was great to see you today!  Schedule an appointment to see me in 2 weeks.  I want you to follow up with vascular surgery as soon as possible.  You are taking Xeralto 15 mg BID - I will fill out the authorization form.

## 2012-02-15 NOTE — Assessment & Plan Note (Signed)
Patient seen in the ED yesterday by myself and Dr. Bridgett Larsson with vascular surgery. Has femoral DVT.  Given lovenox treatment dose yesterday. He will start xeralto 15 mg BID for three weeks then switch to 15 mg daily. I have given him the 10 day free supply and a sample. I will fill out the prior-authorization form.  Patient is to have EVAR - planning for this and holding of anticoagulation per vascular.  I will keep him on xeralto lifelong because this is his third thrombosis, despite this being precipitated by surgery.

## 2012-02-15 NOTE — Telephone Encounter (Signed)
Approval received for Xarelto  15 mg . Pharmacy notified.

## 2012-02-15 NOTE — Progress Notes (Signed)
  Subjective:    Patient ID: [name], male    DOB: 09/12/1971, 41 y.o.   MRN: [id number]  HPI 1. Hospital f/u for DVT right femoral veins/superficial saphenous. Patient seen in the ED yesterday by myself and Dr. Bridgett Larsson with vasc surgery. Found to have DVT's following right groin angiogram 8 days prior. Patient has history of DVT's and PE, was on a 6 month coumadin course. He is current only on two 81 mg of ASA daily. Patient also has a 5.6 cm AAA with dissection planned for EVAR with Dr. Kellie Simmering in a couple weeks. This may be on hold.  He also has follow up with his cardiologist who is managing his HTN. He is on 5 antiHTN meds.    Review of Systems Pertinent items are noted in HPI. No fever, chills, night sweats, weight loss.    Objective:   Physical Exam Filed Vitals:   02/15/12 0832  BP: 158/94  Pulse: 66  Temp: 97.6 F (36.4 C)  TempSrc: Oral  Height: 6' (1.829 m)  Weight: 338 lb (153.316 kg)  Lungs:  Normal respiratory effort, chest expands symmetrically. Lungs are clear to auscultation, no crackles or wheezes. Heart - Regular rate and rhythm.  No murmurs, gallops or rubs.    Abdomen: soft and non-tender without masses, organomegaly or hernias noted.  No guarding or rebound. No bruit auscultated. EXT: right thigh superficial ecchymosis. Nontender. Minimal edema of the right leg. He has very obese legs.    Assessment & Plan:

## 2012-02-15 NOTE — Consult Note (Signed)
[facility] Attending CONSULT Note: Dorcas Mcmurray MD [phone] pager office [phone] I  have  reviewed the chart for this patient, discussed this patient with the resident. I agree with the resident's findings, assessment and care plan. He will be scheduled for AM followup in [facility] clinic with Dr Vallarie Mare. Plan is to start [place].

## 2012-02-16 ENCOUNTER — Other Ambulatory Visit (HOSPITAL_COMMUNITY): Payer: Managed Care, Other (non HMO)

## 2012-02-19 ENCOUNTER — Other Ambulatory Visit: Payer: Self-pay | Admitting: *Deleted

## 2012-02-19 ENCOUNTER — Inpatient Hospital Stay (HOSPITAL_COMMUNITY): Admission: RE | Admit: 2012-02-19 | Payer: Managed Care, Other (non HMO) | Source: Ambulatory Visit

## 2012-02-19 DIAGNOSIS — I82409 Acute embolism and thrombosis of unspecified deep veins of unspecified lower extremity: Secondary | ICD-10-CM

## 2012-02-23 ENCOUNTER — Ambulatory Visit (HOSPITAL_COMMUNITY)
Admission: RE | Admit: 2012-02-23 | Payer: Managed Care, Other (non HMO) | Source: Ambulatory Visit | Admitting: Vascular Surgery

## 2012-02-23 ENCOUNTER — Encounter (HOSPITAL_COMMUNITY): Admission: RE | Payer: Self-pay | Source: Ambulatory Visit

## 2012-02-23 SURGERY — ENDOVASCULAR STENT GRAFT INSERTION
Anesthesia: General

## 2012-02-28 ENCOUNTER — Ambulatory Visit (INDEPENDENT_AMBULATORY_CARE_PROVIDER_SITE_OTHER): Payer: Managed Care, Other (non HMO) | Admitting: Family Medicine

## 2012-02-28 ENCOUNTER — Encounter: Payer: Self-pay | Admitting: Family Medicine

## 2012-02-28 VITALS — BP 124/98 | HR 84 | Temp 98.3°F | Ht 72.0 in | Wt 323.0 lb

## 2012-02-28 DIAGNOSIS — I719 Aortic aneurysm of unspecified site, without rupture: Secondary | ICD-10-CM

## 2012-02-28 DIAGNOSIS — I824Y9 Acute embolism and thrombosis of unspecified deep veins of unspecified proximal lower extremity: Secondary | ICD-10-CM

## 2012-02-28 DIAGNOSIS — I714 Abdominal aortic aneurysm, without rupture, unspecified: Secondary | ICD-10-CM

## 2012-02-28 DIAGNOSIS — N529 Male erectile dysfunction, unspecified: Secondary | ICD-10-CM

## 2012-02-28 MED ORDER — VARDENAFIL HCL 10 MG PO TABS: 10.0000 mg | ORAL_TABLET | Freq: Every day | ORAL | Status: DC | PRN

## 2012-02-28 MED ORDER — TADALAFIL 20 MG PO TABS: 10.0000 mg | ORAL_TABLET | ORAL | Status: DC | PRN

## 2012-02-28 MED ORDER — SILDENAFIL CITRATE 100 MG PO TABS: 50.0000 mg | ORAL_TABLET | Freq: Every day | ORAL | Status: DC | PRN

## 2012-02-28 NOTE — Progress Notes (Signed)
  Subjective:    Patient ID: [name], male    DOB: [birth year], 41 y.o.   MRN: [id number]  HPI 1. Problem swallowing pravastatin pill He says when he swallowed the pill it stuck in the back of his throat and he has tried with water. His last LDL was in 2009 and was 77.  He is currently not taking his statin.   2. Erectile dysfunction 2 month history of inability to get an erection. He is not depressed, has good energy, has not lost desire, he is frustrated with his lack of ability to get erect. Medications reviewed, none cause obvious erectile dysfunction. He is recently married as of nov. Prior to Jan. He has no erectile dysfunction.  3. Hx of DVT Patient was seen by me and placed on Xarelto because of DVT in the right leg. He is also followed by Dr. Kellie Simmering with Vascular surgery. Patient will be reassesed with the vascular surgery team for endovascular grafting of his dissecting triple A. His blood pressure looked acceptable today. BP Readings from Last 3 Encounters:  02/28/12 124/98  02/15/12 158/94  02/14/12 139/97  His blood pressure medications are managed by his cardiologist.    Review of Systems Pertinent items are noted in HPI. No fever, chills, night sweats, weight loss.     Objective:   Physical Exam Filed Vitals:   02/28/12 1459  BP: 124/98  Pulse: 84  Temp: 98.3 F (36.8 C)  TempSrc: Oral  Height: 6' (1.829 m)  Weight: 323 lb (146.512 kg)   Lungs:  Normal respiratory effort, chest expands symmetrically. Lungs are clear to auscultation, no crackles or wheezes. Heart - Regular rate and rhythm.  No murmurs, gallops or rubs.    Extremities:  No cyanosis, edema, or deformity noted with good range of motion of all major joints.       Assessment & Plan:

## 2012-02-28 NOTE — Assessment & Plan Note (Signed)
Doing well. No edema or tenderness in the right leg. C/w Xarelto - no evidence or history of bleeding.

## 2012-02-28 NOTE — Patient Instructions (Signed)
It was great to see you today!  Schedule an appointment to see me as needed.  Please update me about your medications as they change.

## 2012-02-28 NOTE — Assessment & Plan Note (Signed)
Will give three medications printed so he can get the best price. He will shop around. Advised not to use drugs with these meds, or ever. Advised not to take more than one of these meds as prescribed. Advised to go to ER if erection is longer than 4 hours.

## 2012-02-28 NOTE — Assessment & Plan Note (Signed)
BP looks good. Managed by Vascular. Stopping pravasatatin. - will check lipid panel when fasting.

## 2012-02-29 ENCOUNTER — Other Ambulatory Visit: Payer: Managed Care, Other (non HMO)

## 2012-02-29 DIAGNOSIS — I719 Aortic aneurysm of unspecified site, without rupture: Secondary | ICD-10-CM

## 2012-02-29 LAB — LIPID PANEL
HDL: 42 mg/dL (ref >39)
Triglycerides: 72 mg/dL (ref <150)

## 2012-02-29 NOTE — Progress Notes (Signed)
Lipid done today [facility] [name]

## 2012-03-05 ENCOUNTER — Telehealth: Payer: Self-pay | Admitting: *Deleted

## 2012-03-05 NOTE — Telephone Encounter (Signed)
Notice from pharmacy for PA for Xarelto yesterday . This was sent in and today received notice that this has already been approved.  Called insurance and was told the original RX for for Xarelto 15 mg twice daily for 3 weeks then 20 mg daily. Reviewed noted from  office visit 02/15/2012 and note states 15 mg twice daily for three weeks and then 15 mg daily. Paged Dr. Vallarie Mare and he states should be 20 mg daily after the 15 mg twice daily for 3 weeks. Pharmacy  last rx for 15 mg was a 30 day supply  Sent in 02/14/2012. Called and advised pharmacy to change that rx to 15 mg twice daily for 3 weeks . Pharmacist did this and RX for 20 mg one daily   went through.

## 2012-03-18 ENCOUNTER — Encounter: Payer: Self-pay | Admitting: Vascular Surgery

## 2012-03-19 ENCOUNTER — Encounter (INDEPENDENT_AMBULATORY_CARE_PROVIDER_SITE_OTHER): Payer: Managed Care, Other (non HMO) | Admitting: *Deleted

## 2012-03-19 ENCOUNTER — Ambulatory Visit (INDEPENDENT_AMBULATORY_CARE_PROVIDER_SITE_OTHER): Payer: Managed Care, Other (non HMO) | Admitting: Vascular Surgery

## 2012-03-19 ENCOUNTER — Encounter: Payer: Self-pay | Admitting: Vascular Surgery

## 2012-03-19 VITALS — BP 126/81 | HR 74 | Resp 16 | Ht 72.0 in | Wt 324.0 lb

## 2012-03-19 DIAGNOSIS — I82409 Acute embolism and thrombosis of unspecified deep veins of unspecified lower extremity: Secondary | ICD-10-CM

## 2012-03-19 DIAGNOSIS — I714 Abdominal aortic aneurysm, without rupture, unspecified: Secondary | ICD-10-CM

## 2012-03-19 DIAGNOSIS — I6529 Occlusion and stenosis of unspecified carotid artery: Secondary | ICD-10-CM

## 2012-03-19 NOTE — Progress Notes (Signed)
Subjective:     Patient ID: [name], male   DOB: 12-18-1971, 41 y.o.   MRN: [id number]  HPI this 41 year old male was to have had an aortic stent graft for chronic aortic dissection last month. He had an angiogram performed by Dr. Trula Slade. A few days later he developed a right leg DVT. He had significant swelling in the right thigh and calf and ankle area. He was started on Xeralto which he is still taking. He states the edema has essentially resolved. He has not worn elastic compression stocking. He has no new symptoms since his previous evaluation.  Past Medical History  Diagnosis Date  . Chronic kidney disease   . Hypertension   . Stroke 2009  . History of pulmonary embolus (PE) 2010  . 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 Left ventricular hypertrophy   . Abdominal aortic aneurysm     History  Substance Use Topics  . Smoking status: Former Smoker    Quit date: 01/25/1991  . Smokeless tobacco: Not on file  . Alcohol Use: No    Family History  Problem Relation Age of Onset  . Hypertension Mother   . Hypertension Father     No Known Allergies  Current outpatient prescriptions:aspirin 81 MG tablet, Take 162 mg by mouth daily., Disp: , Rfl: ;  carvedilol (COREG) 25 MG tablet, Take 12.5 mg by mouth 2 (two) times daily with a meal., Disp: , Rfl: ;  cloNIDine (CATAPRES) 0.2 MG tablet, Take 0.1 mg by mouth 3 (three) times daily. , Disp: , Rfl: ;  Olmesartan-Amlodipine-HCTZ (TRIBENZOR) 40-10-25 MG TABS, Take 1 tablet by mouth daily., Disp: , Rfl:  Rivaroxaban (XARELTO) 15 MG TABS tablet, Take 1 tablet (15 mg total) by mouth 2 (two) times daily., Disp: 60 tablet, Rfl: 6;  sildenafil (VIAGRA) 100 MG tablet, Take 0.5-1 tablets (50-100 mg total) by mouth daily as needed for erectile dysfunction., Disp: 5 tablet, Rfl: 11;  tadalafil (CIALIS) 20 MG tablet, Take 0.5-1 tablets (10-20 mg total) by mouth every other day as needed for erectile  dysfunction., Disp: 5 tablet, Rfl: 11 vardenafil (LEVITRA) 10 MG tablet, Take 1 tablet (10 mg total) by mouth daily as needed for erectile dysfunction., Disp: 10 tablet, Rfl: 0  BP 126/81  Pulse 74  Resp 16  Ht 6' (1.829 m)  Wt 324 lb (146.965 kg)  BMI 43.94 kg/m2  SpO2 98%  Body mass index is 43.94 kg/(m^2).         Review of Systems denies chest pain, hemoptysis, PND, orthopnea. Does have mild dyspnea on exertion. Other systems are negative and complete review of systems    Objective:   Physical Exam blood pressure 126/81 heart rate 74 serration 16 General well-developed well-nourished male in no apparent distress-morbidly obese Lungs no rhonchi or wheezing Cardiovascular rhythm no murmurs Abdomen obese-5-6 cm pulsatile mass which is nontender 3+ femoral pulses bilaterally-no difference in circumference at the ankle and calf level and right and left lower extremity  Today I ordered a venous duplex exam of the right leg which are reviewed and interpreted. There is improvement in the DVT which was in the right common femoral vein and great saphenous vein and superficial femoral vein. This seems to be resolving but is not completely cleared.    Assessment:     Resolving DVT right lower extremity Needs aortic stent graft for chronic aortic dissection    Plan:     Return in 3  months with repeat venous duplex exam right lower extremity and abdominal aortic duplex scan to look at size of aneurysm If he is doing well we'll then proceed with aortic stent grafting after discontinuing Alen Blew

## 2012-04-01 NOTE — Procedures (Unsigned)
DUPLEX DEEP VENOUS EXAM - LOWER EXTREMITY  INDICATION:  One month followup re-evaluation of right DVT  HISTORY:  Edema:  No Trauma/Surgery:  Previous Pain:  No PE:  Previous Previous DVT:  Bilateral lower extremity Anticoagulants: Other:  DUPLEX EXAM:               CFV    SFV     PopV  PTV   GSV               R   L  R    L  R  L  R  L  R  L Thrombosis    +   o  o       o     o     P Spontaneous   +   +  +       +     +     + Phasic        +   +  +       +     +     + Augmentation  NA  +  NA      +     +     + Compressible  P   +  +       +     +     P Competent     NA     NA      o     o     o  Legend:  + - yes  o - no  p - partial  D - decreased  IMPRESSION: 1. Evidence of nonocclusive subacute deep venous thrombosis in the     right common femoral vein and the saphenofemoral junction which     appears to have regressed when compared to previous report 1 month     prior.  No thrombus is observed in the superficial femoral vein on     today's exam.  Proximal and great saphenous vein appears to be     partially recanalized. 2. Due to presence of deep venous thrombosis, augmentations above the     knee were not performed, however, there is reflux in the popliteal,     posterior tibial and great saphenous veins likely due to previous     deep venous thrombosis.   _____________________________ Nelda Severe Kellie Simmering, M.D.  LT/MEDQ  D:  03/19/2012  T:  03/19/2012  Job:  [id number]

## 2012-04-03 ENCOUNTER — Telehealth: Payer: Self-pay | Admitting: Family Medicine

## 2012-04-03 ENCOUNTER — Encounter: Payer: Self-pay | Admitting: *Deleted

## 2012-04-03 NOTE — Telephone Encounter (Signed)
Called pt and gave results of lipid panel. Results will be sent to pt. Via mail.[name]

## 2012-04-03 NOTE — Telephone Encounter (Signed)
[name] have not received any notification about his lab results taken last month on 3/7.  Please contact him at earliest convenience with info.

## 2012-04-03 NOTE — Telephone Encounter (Signed)
Pt is needing to know if he should continue not taking pravastatin - he had stopped but is not sure if he needs to go back on it.

## 2012-04-03 NOTE — Telephone Encounter (Signed)
Called pt but did not get an answer.  Wanted to inform pt that until Dr. Vallarie Mare tells him to d/c his meds he is to continue to take them until instructed otherwise.Audelia Hives [place]

## 2012-04-04 NOTE — Telephone Encounter (Signed)
Called pt lvm for him to return call to inform him of previous phone note.Audelia Hives [place]

## 2012-04-05 NOTE — Telephone Encounter (Signed)
Called and informed pt that Dr. Vallarie Mare said that he could d/c his cholesterol meds and that if he would like to speak with him further concerning this to schedule an appt. Pt voiced understanding and agreed.[name] [place]

## 2012-06-25 ENCOUNTER — Other Ambulatory Visit: Payer: Self-pay | Admitting: *Deleted

## 2012-06-25 DIAGNOSIS — Z86718 Personal history of other venous thrombosis and embolism: Secondary | ICD-10-CM

## 2012-06-25 DIAGNOSIS — I714 Abdominal aortic aneurysm, without rupture, unspecified: Secondary | ICD-10-CM

## 2012-07-01 ENCOUNTER — Encounter: Payer: Self-pay | Admitting: Vascular Surgery

## 2012-07-02 ENCOUNTER — Encounter (INDEPENDENT_AMBULATORY_CARE_PROVIDER_SITE_OTHER): Payer: Managed Care, Other (non HMO) | Admitting: *Deleted

## 2012-07-02 ENCOUNTER — Ambulatory Visit (INDEPENDENT_AMBULATORY_CARE_PROVIDER_SITE_OTHER): Payer: Managed Care, Other (non HMO) | Admitting: Vascular Surgery

## 2012-07-02 ENCOUNTER — Encounter: Payer: Self-pay | Admitting: Vascular Surgery

## 2012-07-02 VITALS — BP 145/97 | HR 65 | Temp 98.3°F | Ht 72.0 in | Wt 324.0 lb

## 2012-07-02 DIAGNOSIS — I7100 Dissection of unspecified site of aorta: Secondary | ICD-10-CM

## 2012-07-02 DIAGNOSIS — I714 Abdominal aortic aneurysm, without rupture, unspecified: Secondary | ICD-10-CM

## 2012-07-02 DIAGNOSIS — I824Y9 Acute embolism and thrombosis of unspecified deep veins of unspecified proximal lower extremity: Secondary | ICD-10-CM

## 2012-07-02 DIAGNOSIS — Z86718 Personal history of other venous thrombosis and embolism: Secondary | ICD-10-CM

## 2012-07-02 NOTE — Progress Notes (Signed)
Subjective:     Patient ID: [name], male   DOB: 01-Jan-1971, 41 y.o.   MRN: [id number]  HPI this 41 year old male returns for continued followup regarding his aortic dissection with secondary aortic aneurysm and DVT in the right lower extremity. He had an angiogram in February of this year and developed right leg DVT following the angiogram. He has been on [place] since that time. He has no swelling in the right leg. His aortic stent graft was postponed for 6 months while treating the DVT. He has had no or back pain. He also has mild renal insufficiency with previous creatinine baseline of 1.86 and most recently checked in February of 2013 2.1  Past Medical History  Diagnosis Date  . Chronic kidney disease   . Hypertension   . Stroke 2009  . History of pulmonary embolus (PE) 2010  . 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 Left ventricular hypertrophy   . Abdominal aortic aneurysm     History  Substance Use Topics  . Smoking status: Former Smoker    Quit date: 01/25/1991  . Smokeless tobacco: Never Used  . Alcohol Use: No    Family History  Problem Relation Age of Onset  . Hypertension Mother   . Hypertension Father     No Known Allergies  Current outpatient prescriptions:aspirin 81 MG tablet, Take 162 mg by mouth daily., Disp: , Rfl: ;  carvedilol (COREG) 25 MG tablet, Take 25 mg by mouth 2 (two) times daily with a meal. , Disp: , Rfl: ;  cloNIDine (CATAPRES) 0.2 MG tablet, Take 0.1 mg by mouth 3 (three) times daily. , Disp: , Rfl: ;  Olmesartan-Amlodipine-HCTZ (TRIBENZOR) 40-10-25 MG TABS, Take 1 tablet by mouth daily., Disp: , Rfl:  Rivaroxaban (XARELTO) 15 MG TABS tablet, Take 1 tablet (15 mg total) by mouth 2 (two) times daily., Disp: 60 tablet, Rfl: 6;  sildenafil (VIAGRA) 100 MG tablet, Take 0.5-1 tablets (50-100 mg total) by mouth daily as needed for erectile dysfunction., Disp: 5 tablet, Rfl: 11;  tadalafil (CIALIS) 20 MG tablet,  Take 0.5-1 tablets (10-20 mg total) by mouth every other day as needed for erectile dysfunction., Disp: 5 tablet, Rfl: 11 vardenafil (LEVITRA) 10 MG tablet, Take 1 tablet (10 mg total) by mouth daily as needed for erectile dysfunction., Disp: 10 tablet, Rfl: 0  BP 145/97  Pulse 65  Temp 98.3 F (36.8 C) (Oral)  Ht 6' (1.829 m)  Wt 324 lb (146.965 kg)  BMI 43.94 kg/m2  SpO2 100%  Body mass index is 43.94 kg/(m^2).           Review of Systems denies chest pain, dyspnea on exertion, PND, orthopnea, hemoptysis, claudication.     Objective:   Physical Exam blood pressure 145/97 heart rate 65 respirations 16 Gen.-alert and oriented x3 in no apparent distress HEENT normal for age Lungs no rhonchi or wheezing Cardiovascular regular rhythm no murmurs carotid pulses 3+ palpable no bruits audible Abdomen soft nontender no palpable masses-obese Musculoskeletal free of  major deformities Skin clear -no rashes Neurologic normal Lower extremities 3+ femoral and dorsalis pedis pulses palpable bilaterally with no edema  Today I ordered a duplex scan of his abdominal aorta which reveals a maximum diameter to be approximately 4.6 cm. Most recent CT scan revealed it to be 5.6 cm. He also had a duplex scan of his right lower extremity which revealed no evidence of residual thrombus in the right common femoral or  superficial femoral vein.       Assessment:     Abdominal aortic aneurysm secondary to aortic dissection which extended into left common iliac artery DVT right lower extremity in February 2013 treated with Alen Blew    Plan:     Ofilia Neas in one month Return in 2 months with CT angiogram hopefully with contrast his creatinine satisfactory Will then schedule for aortic stent graft

## 2012-07-02 NOTE — Addendum Note (Signed)
Addended by: Mena Goes on: 07/02/2012 11:51 AM   Modules accepted: Orders

## 2012-07-10 NOTE — Procedures (Unsigned)
DUPLEX DEEP VENOUS EXAM - LOWER EXTREMITY  INDICATION:  History of DVT  HISTORY:  Edema:  No Trauma/Surgery:  No Pain:  No PE:  Previous Previous DVT:  Bilateral lower extremities, nonocclusive subacute deep vein thrombosis in the right common femoral vein and saphenofemoral junction was noted on 03/19/2012 Anticoagulants: Other:  DUPLEX EXAM:               CFV   SFV   PopV  PTV    GSV               R  L  R  L  R  L  R   L  R  L Thrombosis    o  o  o     o     o      o Spontaneous   +  +  +     +     +      + Phasic        +  +  +     +     +      + Augmentation  +  +  +     +     +      + Compressible  +  +  +     +     +      + Competent     o  o  +     o     +      o  Legend:  + - yes  o - no  p - partial  D - decreased  IMPRESSION: 1. No evidence of deep or superficial vein thrombosis noted in the     right lower extremity based on decreased visualization due to     patient body habitus. 2. Reflux of >500 milliseconds noted in the right popliteal, right     great saphenous vein and bilateral common femoral veins.   _____________________________ Nelda Severe Kellie Simmering, M.D.  CH/MEDQ  D:  07/05/2012  T:  07/05/2012  Job:  [id number]

## 2012-07-10 NOTE — Procedures (Unsigned)
DUPLEX ULTRASOUND OF ABDOMINAL AORTA  INDICATION:  Abdominal aortic aneurysm  HISTORY: Diabetes:  No Cardiac:  No Hypertension:  Yes Smoking:  Previous Connective Tissue Disorder: Family History:  No Previous Surgery:  No  DUPLEX EXAM:         AP (cm)                   TRANSVERSE (cm) Proximal             Not visualized            Not visualized Mid                  2.9 cm                    2.8 cm Distal               4.6 cm                    4.5 cm Right Iliac          2.6 cm                    2.5 cm Left Iliac           2.4 cm                    2.3 cm  PREVIOUS:  Date:  AP:  TRANSVERSE:  IMPRESSION: 1. Aneurysmal dilatation of the distal abdominal aorta with evidence     of known dissection noted within aneurysmal sac.  Turbulent flow     noted in the region with no visualized intimal flap movement. 2. Dilatation of the bilateral proximal common iliac arteries noted,     as described above.  ___________________________________________ Nelda Severe. Kellie Simmering, M.D.  CH/MEDQ  D:  07/05/2012  T:  07/05/2012  Job:  [id number]

## 2012-08-30 ENCOUNTER — Other Ambulatory Visit: Payer: Self-pay | Admitting: Vascular Surgery

## 2012-08-30 LAB — BUN: BUN: 31 mg/dL — ABNORMAL HIGH (ref 6–23)

## 2012-09-02 ENCOUNTER — Encounter: Payer: Self-pay | Admitting: Vascular Surgery

## 2012-09-03 ENCOUNTER — Ambulatory Visit (INDEPENDENT_AMBULATORY_CARE_PROVIDER_SITE_OTHER): Payer: Managed Care, Other (non HMO) | Admitting: Vascular Surgery

## 2012-09-03 ENCOUNTER — Ambulatory Visit
Admission: RE | Admit: 2012-09-03 | Discharge: 2012-09-03 | Disposition: A | Discharge status: Home / Self Care | Payer: Managed Care, Other (non HMO) | Source: Ambulatory Visit | Attending: Vascular Surgery | Admitting: Vascular Surgery

## 2012-09-03 ENCOUNTER — Encounter: Payer: Self-pay | Admitting: Vascular Surgery

## 2012-09-03 VITALS — BP 129/89 | HR 69 | Resp 18 | Ht 72.0 in | Wt 330.0 lb

## 2012-09-03 DIAGNOSIS — I714 Abdominal aortic aneurysm, without rupture, unspecified: Secondary | ICD-10-CM

## 2012-09-03 DIAGNOSIS — I7100 Dissection of unspecified site of aorta: Secondary | ICD-10-CM

## 2012-09-03 MED ORDER — IOHEXOL 350 MG/ML SOLN
80.0000 mL | Freq: Once | INTRAVENOUS | Status: AC | PRN
  Administered 2012-09-03: 80 mL via INTRAVENOUS

## 2012-09-03 NOTE — Progress Notes (Signed)
Subjective:     Patient ID: [name], male   DOB: 1971-09-06, 41 y.o.   MRN: [id number]  HPI this 41 year old male returns today to discuss treatment of his infrarenal abdominal aortic aneurysm with infrarenal aortic dissection. He was previously scheduled to have the stent graft placed several months ago but developed a DVT in the right side after angiography. This is now resolved and he is off his [place]. He has had no chest or abdominal pain. Denies claudication symptoms but does not ambulate long distances.  Past Medical History  Diagnosis Date  . Chronic kidney disease   . Hypertension   . Stroke 2009  . History of pulmonary embolus (PE) 2010  . 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 Left ventricular hypertrophy   . Abdominal aortic aneurysm     History  Substance Use Topics  . Smoking status: Former Smoker -- 3 years    Types: Cigarettes    Quit date: 01/25/1991  . Smokeless tobacco: Never Used  . Alcohol Use: No    Family History  Problem Relation Age of Onset  . Hypertension Mother   . Hypertension Father     No Known Allergies  Current outpatient prescriptions:aspirin 81 MG tablet, Take 162 mg by mouth daily., Disp: , Rfl: ;  carvedilol (COREG) 25 MG tablet, Take 25 mg by mouth 2 (two) times daily with a meal. , Disp: , Rfl: ;  Olmesartan-Amlodipine-HCTZ (TRIBENZOR) 40-10-25 MG TABS, Take 1 tablet by mouth daily., Disp: , Rfl: ;  cloNIDine (CATAPRES) 0.2 MG tablet, Take 0.1 mg by mouth 3 (three) times daily. , Disp: , Rfl:  Rivaroxaban (XARELTO) 15 MG TABS tablet, Take 1 tablet (15 mg total) by mouth 2 (two) times daily., Disp: 60 tablet, Rfl: 6;  sildenafil (VIAGRA) 100 MG tablet, Take 0.5-1 tablets (50-100 mg total) by mouth daily as needed for erectile dysfunction., Disp: 5 tablet, Rfl: 11;  tadalafil (CIALIS) 20 MG tablet, Take 0.5-1 tablets (10-20 mg total) by mouth every other day as needed for erectile dysfunction., Disp:  5 tablet, Rfl: 11 vardenafil (LEVITRA) 10 MG tablet, Take 1 tablet (10 mg total) by mouth daily as needed for erectile dysfunction., Disp: 10 tablet, Rfl: 0 No current facility-administered medications for this visit. Facility-Administered Medications Ordered in Other Visits: iohexol (OMNIPAQUE) 350 MG/ML injection 80 mL, 80 mL, Intravenous, Once PRN, Areta Haber, MD, 80 mL at 09/03/12 1012  BP 129/89  Pulse 69  Resp 18  Ht 6' (1.829 m)  Wt 330 lb (149.687 kg)  BMI 44.76 kg/m2  Body mass index is 44.76 kg/(m^2).          Review of Systems denies chest pain, does complain of mild dyspnea on exertion. No swelling in lower extremities. Denies abdominal or back pain. Other systems negative and complete review of systems patient does have a history of a pulmonary embolus many years ago. Also had cardiac catheterization performed in 2010 by Dr. Alvester Chou with no evidence of coronary artery disease     Objective:   Physical Exam blood pressure 100/89 heart rate 69 respirations 18 Gen.-alert and oriented x3 in no apparent distress HEENT normal for age Lungs no rhonchi or wheezing Cardiovascular regular rhythm no murmurs carotid pulses 3+ palpable no bruits audible Abdomen soft nontender no palpable mass.-Morbidly obese Musculoskeletal free of  major deformities Skin clear -no rashes Neurologic normal Lower extremities 3+ femoral and dorsalis pedis pulses palpable bilaterally with no edema  Today I ordered a CT angiogram which are reviewed and interpreted by computer. The aneurysm is now 5.7 x 4.8 cm in maximum diameter. There is a dissection within the aneurysm of the infrarenal aorta which reenters into the left common iliac artery right common iliac artery widely patent.     Assessment:     #1 infrarenal abdominal aortic aneurysm with dissection  #2 history of DVT right leg and remote history PE #3 no coronary artery disease by cardiac catheterization in 2011    Plan:       Insertion of aortic stent graft on Wednesday, October 2. Risks and benefits of this have been thoroughly discussed with patient and he understands and would like to proceed

## 2012-09-05 ENCOUNTER — Other Ambulatory Visit: Payer: Self-pay | Admitting: *Deleted

## 2012-09-05 ENCOUNTER — Encounter: Payer: Self-pay | Admitting: *Deleted

## 2012-09-11 ENCOUNTER — Encounter (HOSPITAL_COMMUNITY): Payer: Self-pay | Admitting: Pharmacy Technician

## 2012-09-13 ENCOUNTER — Other Ambulatory Visit (HOSPITAL_COMMUNITY): Payer: Managed Care, Other (non HMO)

## 2012-09-16 ENCOUNTER — Ambulatory Visit (HOSPITAL_COMMUNITY): Admission: RE | Admit: 2012-09-16 | Payer: Managed Care, Other (non HMO) | Source: Ambulatory Visit

## 2012-09-16 ENCOUNTER — Encounter (HOSPITAL_COMMUNITY)
Admission: RE | Admit: 2012-09-16 | Discharge: 2012-09-16 | Disposition: A | Discharge status: Home / Self Care | Payer: Managed Care, Other (non HMO) | Source: Ambulatory Visit | Attending: Vascular Surgery | Admitting: Vascular Surgery

## 2012-09-16 ENCOUNTER — Ambulatory Visit (HOSPITAL_COMMUNITY)
Admission: RE | Admit: 2012-09-16 | Discharge: 2012-09-16 | Disposition: A | Discharge status: Home / Self Care | Payer: Managed Care, Other (non HMO) | Source: Ambulatory Visit | Attending: Vascular Surgery | Admitting: Vascular Surgery

## 2012-09-16 ENCOUNTER — Encounter (HOSPITAL_COMMUNITY): Payer: Self-pay

## 2012-09-16 DIAGNOSIS — I10 Essential (primary) hypertension: Secondary | ICD-10-CM | POA: Insufficient documentation

## 2012-09-16 DIAGNOSIS — Z01818 Encounter for other preprocedural examination: Secondary | ICD-10-CM | POA: Insufficient documentation

## 2012-09-16 DIAGNOSIS — Z01812 Encounter for preprocedural laboratory examination: Secondary | ICD-10-CM | POA: Insufficient documentation

## 2012-09-16 DIAGNOSIS — I719 Aortic aneurysm of unspecified site, without rupture: Secondary | ICD-10-CM | POA: Insufficient documentation

## 2012-09-16 HISTORY — DX: Anxiety disorder, unspecified: F41.9

## 2012-09-16 HISTORY — DX: Obesity, unspecified: E66.9

## 2012-09-16 HISTORY — DX: Acute embolism and thrombosis of unspecified deep veins of unspecified lower extremity: I82.409

## 2012-09-16 LAB — COMPREHENSIVE METABOLIC PANEL
Albumin: 3.4 g/dL — ABNORMAL LOW (ref 3.5–5.2)
Alkaline Phosphatase: 75 U/L (ref 39–117)
BUN: 25 mg/dL — ABNORMAL HIGH (ref 6–23)
Creatinine, Ser: 1.85 mg/dL — ABNORMAL HIGH (ref 0.50–1.35)
Potassium: 4.1 mEq/L (ref 3.5–5.1)
Total Protein: 7.5 g/dL (ref 6.0–8.3)

## 2012-09-16 LAB — BLOOD GAS, ARTERIAL
Drawn by: 344381
FIO2: 0.21 %
Patient temperature: 98.6
TCO2: 24.4 mmol/L (ref 0–100)
pH, Arterial: 7.433 (ref 7.350–7.450)

## 2012-09-16 LAB — URINALYSIS, ROUTINE W REFLEX MICROSCOPIC
Leukocytes, UA: NEGATIVE
Protein, ur: NEGATIVE mg/dL
Urobilinogen, UA: 0.2 mg/dL (ref 0.0–1.0)

## 2012-09-16 LAB — CBC
HCT: 38.4 % — ABNORMAL LOW (ref 39.0–52.0)
MCHC: 33.3 g/dL (ref 30.0–36.0)
Platelets: 186 10*3/uL (ref 150–400)
RDW: 13.4 % (ref 11.5–15.5)

## 2012-09-16 LAB — TYPE AND SCREEN
ABO/RH(D): B POS
Antibody Screen: NEGATIVE

## 2012-09-16 LAB — APTT: aPTT: 35 seconds (ref 24–37)

## 2012-09-16 LAB — ABO/RH: ABO/RH(D): B POS

## 2012-09-16 LAB — PROTIME-INR: INR: 1.04 (ref 0.00–1.49)

## 2012-09-16 NOTE — Pre-Procedure Instructions (Signed)
[place]  09/16/2012   Your procedure is scheduled on:  09/25/12  Report to [place] at 630 AM.  Call this number if you have problems the morning of surgery: [phone]   Remember:   Do not eat food:After Midnight.    Take these medicines the morning of surgery with A SIP OF WATER: coreg   Do not wear jewelry, make-up or nail polish.  Do not wear lotions, powders, or perfumes. You may wear deodorant.  Do not shave 48 hours prior to surgery. Men may shave face and neck.  Do not bring valuables to the hospital.  Contacts, dentures or bridgework may not be worn into surgery.  Leave suitcase in the car. After surgery it may be brought to your room.  For patients admitted to the hospital, checkout time is 11:00 AM the day of discharge.   Patients discharged the day of surgery will not be allowed to drive home.  Name and phone number of your driver: family  Special Instructions: Shower using CHG 2 nights before surgery and the night before surgery.  If you shower the day of surgery use CHG.  Use special wash - you have one bottle of CHG for all showers.  You should use approximately 1/3 of the bottle for each shower.   Please read over the following fact sheets that you were given: Pain Booklet, Coughing and Deep Breathing, Blood Transfusion Information, MRSA Information and Surgical Site Infection Prevention

## 2012-09-16 NOTE — Pre-Procedure Instructions (Signed)
[place]  09/16/2012   Your procedure is scheduled on:  09/25/12  Report to [facility] at 630 AM.  Call this number if you have problems the morning of surgery: [phone]   Remember:   Do not eat food:After Midnight.     Take these medicines the morning of surgery with A SIP OF WATER: carvedilol   Do not wear jewelry, make-up or nail polish.  Do not wear lotions, powders, or perfumes. You may wear deodorant.  Do not shave 48 hours prior to surgery. Men may shave face and neck.  Do not bring valuables to the hospital.  Contacts, dentures or bridgework may not be worn into surgery.  Leave suitcase in the car. After surgery it may be brought to your room.  For patients admitted to the hospital, checkout time is 11:00 AM the day of discharge.   Patients discharged the day of surgery will not be allowed to drive home.  Name and phone number of your driver: family  Special Instructions: Shower using CHG 2 nights before surgery and the night before surgery.  If you shower the day of surgery use CHG.  Use special wash - you have one bottle of CHG for all showers.  You should use approximately 1/3 of the bottle for each shower.   Please read over the following fact sheets that you were given: Pain Booklet, Coughing and Deep Breathing, Blood Transfusion Information, MRSA Information and Surgical Site Infection Prevention

## 2012-09-24 MED ORDER — DEXTROSE 5 % IV SOLN
1.5000 g | INTRAVENOUS | Status: AC
  Administered 2012-09-25: 1.5 g via INTRAVENOUS
  Filled 2012-09-24: qty 1.5

## 2012-09-25 ENCOUNTER — Encounter (HOSPITAL_COMMUNITY): Payer: Self-pay

## 2012-09-25 ENCOUNTER — Encounter (HOSPITAL_COMMUNITY): Payer: Self-pay | Admitting: Anesthesiology

## 2012-09-25 ENCOUNTER — Encounter (HOSPITAL_COMMUNITY): Payer: Self-pay | Admitting: *Deleted

## 2012-09-25 ENCOUNTER — Inpatient Hospital Stay (HOSPITAL_COMMUNITY)
Admission: RE | Admit: 2012-09-25 | Discharge: 2012-09-26 | DRG: 238 | Disposition: A | Discharge status: Home / Self Care | Payer: Managed Care, Other (non HMO) | Source: Ambulatory Visit | Attending: Vascular Surgery | Admitting: Vascular Surgery

## 2012-09-25 ENCOUNTER — Encounter (HOSPITAL_COMMUNITY)
Admission: RE | Disposition: A | Discharge status: Home / Self Care | Payer: Self-pay | Source: Ambulatory Visit | Attending: Vascular Surgery

## 2012-09-25 ENCOUNTER — Other Ambulatory Visit: Payer: Self-pay | Admitting: *Deleted

## 2012-09-25 ENCOUNTER — Ambulatory Visit (HOSPITAL_COMMUNITY): Payer: Managed Care, Other (non HMO) | Admitting: Anesthesiology

## 2012-09-25 ENCOUNTER — Inpatient Hospital Stay (HOSPITAL_COMMUNITY): Payer: Managed Care, Other (non HMO)

## 2012-09-25 DIAGNOSIS — Z7982 Long term (current) use of aspirin: Secondary | ICD-10-CM

## 2012-09-25 DIAGNOSIS — I714 Abdominal aortic aneurysm, without rupture, unspecified: Secondary | ICD-10-CM

## 2012-09-25 DIAGNOSIS — N189 Chronic kidney disease, unspecified: Secondary | ICD-10-CM | POA: Diagnosis present

## 2012-09-25 DIAGNOSIS — Z87891 Personal history of nicotine dependence: Secondary | ICD-10-CM

## 2012-09-25 DIAGNOSIS — I129 Hypertensive chronic kidney disease with stage 1 through stage 4 chronic kidney disease, or unspecified chronic kidney disease: Secondary | ICD-10-CM | POA: Diagnosis present

## 2012-09-25 DIAGNOSIS — Z86718 Personal history of other venous thrombosis and embolism: Secondary | ICD-10-CM

## 2012-09-25 DIAGNOSIS — Z48812 Encounter for surgical aftercare following surgery on the circulatory system: Secondary | ICD-10-CM

## 2012-09-25 DIAGNOSIS — Z79899 Other long term (current) drug therapy: Secondary | ICD-10-CM

## 2012-09-25 DIAGNOSIS — Z8673 Personal history of transient ischemic attack (TIA), and cerebral infarction without residual deficits: Secondary | ICD-10-CM

## 2012-09-25 HISTORY — PX: ABDOMINAL AORTIC ANEURYSM REPAIR: SUR1152

## 2012-09-25 HISTORY — PX: INTRAVASCULAR ULTRASOUND: SHX5452

## 2012-09-25 LAB — CBC
MCH: 26.5 pg (ref 26.0–34.0)
MCV: 80.4 fL (ref 78.0–100.0)
Platelets: 120 10*3/uL — ABNORMAL LOW (ref 150–400)
RDW: 13.4 % (ref 11.5–15.5)

## 2012-09-25 LAB — BASIC METABOLIC PANEL
Calcium: 8.6 mg/dL (ref 8.4–10.5)
Creatinine, Ser: 1.98 mg/dL — ABNORMAL HIGH (ref 0.50–1.35)
GFR calc Af Amer: 46 mL/min — ABNORMAL LOW (ref >90)
Potassium: 3.8 mEq/L (ref 3.5–5.1)

## 2012-09-25 LAB — APTT: aPTT: 30 seconds (ref 24–37)

## 2012-09-25 LAB — GLUCOSE, CAPILLARY: Glucose-Capillary: 138 mg/dL — ABNORMAL HIGH (ref 70–99)

## 2012-09-25 SURGERY — INSERTION, ENDOVASCULAR STENT GRAFT, AORTA, ABDOMINAL
Anesthesia: General | Wound class: Clean

## 2012-09-25 MED ORDER — ONDANSETRON HCL 4 MG/2ML IJ SOLN
INTRAMUSCULAR | Status: DC | PRN
  Administered 2012-09-25: 4 mg via INTRAVENOUS

## 2012-09-25 MED ORDER — METOPROLOL TARTRATE 1 MG/ML IV SOLN: 2.0000 mg | INTRAVENOUS | Status: DC | PRN

## 2012-09-25 MED ORDER — PHENOL 1.4 % MT LIQD: 1.0000 | OROMUCOSAL | Status: DC | PRN

## 2012-09-25 MED ORDER — FENTANYL CITRATE 0.05 MG/ML IJ SOLN
INTRAMUSCULAR | Status: DC | PRN
  Administered 2012-09-25 (×3): 50 ug via INTRAVENOUS
  Administered 2012-09-25: 100 ug via INTRAVENOUS

## 2012-09-25 MED ORDER — IODIXANOL 320 MG/ML IV SOLN
INTRAVENOUS | Status: DC | PRN
  Administered 2012-09-25: 150 mL via INTRA_ARTERIAL

## 2012-09-25 MED ORDER — ROCURONIUM BROMIDE 100 MG/10ML IV SOLN
INTRAVENOUS | Status: DC | PRN
  Administered 2012-09-25: 50 mg via INTRAVENOUS

## 2012-09-25 MED ORDER — LACTATED RINGERS IV SOLN
INTRAVENOUS | Status: DC | PRN
  Administered 2012-09-25: 08:00:00 via INTRAVENOUS

## 2012-09-25 MED ORDER — ACETAMINOPHEN 325 MG RE SUPP
325.0000 mg | RECTAL | Status: DC | PRN
  Filled 2012-09-25: qty 2

## 2012-09-25 MED ORDER — PROPOFOL 10 MG/ML IV BOLUS
INTRAVENOUS | Status: DC | PRN
  Administered 2012-09-25: 160 mg via INTRAVENOUS

## 2012-09-25 MED ORDER — PHENYLEPHRINE HCL 10 MG/ML IJ SOLN
10.0000 mg | INTRAVENOUS | Status: DC | PRN
  Administered 2012-09-25: 10 ug/min via INTRAVENOUS

## 2012-09-25 MED ORDER — MIDAZOLAM HCL 5 MG/5ML IJ SOLN
INTRAMUSCULAR | Status: DC | PRN
  Administered 2012-09-25 (×2): 1 mg via INTRAVENOUS

## 2012-09-25 MED ORDER — MORPHINE SULFATE 2 MG/ML IJ SOLN
2.0000 mg | INTRAMUSCULAR | Status: DC | PRN
Start: 2012-09-25 — End: 2012-09-26

## 2012-09-25 MED ORDER — LABETALOL HCL 5 MG/ML IV SOLN: 10.0000 mg | INTRAVENOUS | Status: DC | PRN

## 2012-09-25 MED ORDER — HYDRALAZINE HCL 20 MG/ML IJ SOLN: 10.0000 mg | INTRAMUSCULAR | Status: DC | PRN

## 2012-09-25 MED ORDER — IRBESARTAN 300 MG PO TABS
300.0000 mg | ORAL_TABLET | Freq: Every day | ORAL | Status: DC
  Administered 2012-09-25 – 2012-09-26 (×2): 300 mg via ORAL
  Filled 2012-09-25 (×2): qty 1

## 2012-09-25 MED ORDER — MAGNESIUM SULFATE 40 MG/ML IJ SOLN
2.0000 g | Freq: Once | INTRAMUSCULAR | Status: AC | PRN
  Filled 2012-09-25: qty 50

## 2012-09-25 MED ORDER — OXYCODONE-ACETAMINOPHEN 5-325 MG PO TABS: 1.0000 | ORAL_TABLET | ORAL | Status: DC | PRN

## 2012-09-25 MED ORDER — AMLODIPINE BESYLATE 10 MG PO TABS
10.0000 mg | ORAL_TABLET | Freq: Every day | ORAL | Status: DC
  Administered 2012-09-25 – 2012-09-26 (×2): 10 mg via ORAL
  Filled 2012-09-25 (×2): qty 1

## 2012-09-25 MED ORDER — PROTAMINE SULFATE 10 MG/ML IV SOLN
INTRAVENOUS | Status: DC | PRN
  Administered 2012-09-25 (×5): 10 mg via INTRAVENOUS

## 2012-09-25 MED ORDER — ALUM & MAG HYDROXIDE-SIMETH 200-200-20 MG/5ML PO SUSP: 15.0000 mL | ORAL | Status: DC | PRN

## 2012-09-25 MED ORDER — POTASSIUM CHLORIDE CRYS ER 20 MEQ PO TBCR: 20.0000 meq | EXTENDED_RELEASE_TABLET | Freq: Once | ORAL | Status: AC | PRN

## 2012-09-25 MED ORDER — DOCUSATE SODIUM 100 MG PO CAPS
100.0000 mg | ORAL_CAPSULE | Freq: Every day | ORAL | Status: DC
  Administered 2012-09-26: 100 mg via ORAL
  Filled 2012-09-25: qty 1

## 2012-09-25 MED ORDER — DEXTROSE 5 % IV SOLN
1.5000 g | Freq: Two times a day (BID) | INTRAVENOUS | Status: AC
  Administered 2012-09-25 – 2012-09-26 (×2): 1.5 g via INTRAVENOUS
  Filled 2012-09-25 (×2): qty 1.5

## 2012-09-25 MED ORDER — FUROSEMIDE 10 MG/ML IJ SOLN
80.0000 mg | INTRAMUSCULAR | Status: AC
  Filled 2012-09-25: qty 8

## 2012-09-25 MED ORDER — HYDROCHLOROTHIAZIDE 25 MG PO TABS
25.0000 mg | ORAL_TABLET | Freq: Every day | ORAL | Status: DC
  Administered 2012-09-25 – 2012-09-26 (×2): 25 mg via ORAL
  Filled 2012-09-25 (×2): qty 1

## 2012-09-25 MED ORDER — GLYCOPYRROLATE 0.2 MG/ML IJ SOLN
INTRAMUSCULAR | Status: DC | PRN
  Administered 2012-09-25: .8 mg via INTRAVENOUS

## 2012-09-25 MED ORDER — MANNITOL 25 % IV SOLN
25.0000 g | Freq: Once | INTRAVENOUS | Status: DC
  Filled 2012-09-25: qty 100

## 2012-09-25 MED ORDER — PHENYLEPHRINE HCL 10 MG/ML IJ SOLN
INTRAMUSCULAR | Status: DC | PRN
  Administered 2012-09-25 (×2): 40 ug via INTRAVENOUS

## 2012-09-25 MED ORDER — ONDANSETRON HCL 4 MG/2ML IJ SOLN: 4.0000 mg | Freq: Four times a day (QID) | INTRAMUSCULAR | Status: DC | PRN

## 2012-09-25 MED ORDER — POTASSIUM CHLORIDE IN NACL 20-0.9 MEQ/L-% IV SOLN
INTRAVENOUS | Status: DC
  Administered 2012-09-25 – 2012-09-26 (×2): via INTRAVENOUS
  Filled 2012-09-25 (×3): qty 1000

## 2012-09-25 MED ORDER — NEOSTIGMINE METHYLSULFATE 1 MG/ML IJ SOLN
INTRAMUSCULAR | Status: DC | PRN
  Administered 2012-09-25: 5 mg via INTRAVENOUS

## 2012-09-25 MED ORDER — HEPARIN SODIUM (PORCINE) 1000 UNIT/ML IJ SOLN
INTRAMUSCULAR | Status: DC | PRN
  Administered 2012-09-25: 7000 [IU] via INTRAVENOUS

## 2012-09-25 MED ORDER — ACETAMINOPHEN 325 MG PO TABS: 325.0000 mg | ORAL_TABLET | ORAL | Status: DC | PRN

## 2012-09-25 MED ORDER — SODIUM CHLORIDE 0.9 % IV SOLN: 500.0000 mL | Freq: Once | INTRAVENOUS | Status: AC | PRN

## 2012-09-25 MED ORDER — ASPIRIN EC 81 MG PO TBEC
162.0000 mg | DELAYED_RELEASE_TABLET | Freq: Every day | ORAL | Status: DC
  Administered 2012-09-26: 162 mg via ORAL
  Filled 2012-09-25: qty 2

## 2012-09-25 MED ORDER — CARVEDILOL 25 MG PO TABS
25.0000 mg | ORAL_TABLET | Freq: Two times a day (BID) | ORAL | Status: DC
  Administered 2012-09-25 – 2012-09-26 (×2): 25 mg via ORAL
  Filled 2012-09-25 (×5): qty 1

## 2012-09-25 MED ORDER — 0.9 % SODIUM CHLORIDE (POUR BTL) OPTIME
TOPICAL | Status: DC | PRN
  Administered 2012-09-25: 1000 mL

## 2012-09-25 MED ORDER — VECURONIUM BROMIDE 10 MG IV SOLR
INTRAVENOUS | Status: DC | PRN
  Administered 2012-09-25 (×2): 1 mg via INTRAVENOUS

## 2012-09-25 MED ORDER — GUAIFENESIN-DM 100-10 MG/5ML PO SYRP: 15.0000 mL | ORAL_SOLUTION | ORAL | Status: DC | PRN

## 2012-09-25 MED ORDER — OLMESARTAN-AMLODIPINE-HCTZ 40-10-25 MG PO TABS: 1.0000 | ORAL_TABLET | Freq: Every day | ORAL | Status: DC

## 2012-09-25 MED ORDER — SODIUM CHLORIDE 0.9 % IR SOLN
Status: DC | PRN
  Administered 2012-09-25: 10:00:00

## 2012-09-25 SURGICAL SUPPLY — 92 items
ADH SKN CLS APL DERMABOND .7 (GAUZE/BANDAGES/DRESSINGS) ×2
BAG BANDED W/RUBBER/TAPE 36X54 (MISCELLANEOUS) ×3 IMPLANT
BAG EQP BAND 135X91 W/RBR TAPE (MISCELLANEOUS) ×2
BAG SNAP BAND KOVER 36X36 (MISCELLANEOUS) ×2 IMPLANT
BALLN CODA OCL 2-9.0-35-120-3 (BALLOONS)
BALLOON COD OCL 2-9.0-35-120-3 (BALLOONS) IMPLANT
CANISTER SUCTION 2500CC (MISCELLANEOUS) ×2 IMPLANT
CATH BEACON 5.038 65CM KMP-01 (CATHETERS) ×1 IMPLANT
CATH OMNI FLUSH .035X70CM (CATHETERS) ×1 IMPLANT
CATH VISIONS PV .035 IVUS (CATHETERS) ×1 IMPLANT
CLIP TI MEDIUM 24 (CLIP) IMPLANT
CLIP TI WIDE RED SMALL 24 (CLIP) IMPLANT
CLOTH BEACON ORANGE TIMEOUT ST (SAFETY) ×2 IMPLANT
COVER DOME SNAP 22 D (MISCELLANEOUS) ×2 IMPLANT
COVER MAYO STAND STRL (DRAPES) ×2 IMPLANT
COVER PROBE W GEL 5X96 (DRAPES) ×2 IMPLANT
COVER SURGICAL LIGHT HANDLE (MISCELLANEOUS) ×2 IMPLANT
DERMABOND ADVANCED (GAUZE/BANDAGES/DRESSINGS) ×2
DERMABOND ADVANCED .7 DNX12 (GAUZE/BANDAGES/DRESSINGS) ×1 IMPLANT
DEVICE CLOSURE PERCLS PRGLD 6F (VASCULAR PRODUCTS) IMPLANT
DRAIN CHANNEL 10F 3/8 F FF (DRAIN) IMPLANT
DRAIN CHANNEL 10M FLAT 3/4 FLT (DRAIN) IMPLANT
DRAPE TABLE COVER HEAVY DUTY (DRAPES) ×2 IMPLANT
DRESSING OPSITE X SMALL 2X3 (GAUZE/BANDAGES/DRESSINGS) ×2 IMPLANT
DRYSEAL FLEXSHEATH 16FR 33CM (SHEATH) ×1
DRYSEAL FLEXSHEATH 18FR 33CM (SHEATH) ×1
ELECT CAUTERY BLADE 6.4 (BLADE) IMPLANT
ELECT REM PT RETURN 9FT ADLT (ELECTROSURGICAL) ×4
ELECTRODE REM PT RTRN 9FT ADLT (ELECTROSURGICAL) ×2 IMPLANT
EVACUATOR 3/16  PVC DRAIN (DRAIN)
EVACUATOR 3/16 PVC DRAIN (DRAIN) IMPLANT
EVACUATOR SILICONE 100CC (DRAIN) IMPLANT
GAUZE SPONGE 2X2 8PLY STRL LF (GAUZE/BANDAGES/DRESSINGS) IMPLANT
GLOVE BIO SURGEON STRL SZ 6.5 (GLOVE) ×1 IMPLANT
GLOVE BIOGEL PI IND STRL 6.5 (GLOVE) IMPLANT
GLOVE BIOGEL PI IND STRL 7.0 (GLOVE) IMPLANT
GLOVE BIOGEL PI IND STRL 7.5 (GLOVE) IMPLANT
GLOVE BIOGEL PI INDICATOR 6.5 (GLOVE) ×2
GLOVE BIOGEL PI INDICATOR 7.0 (GLOVE) ×1
GLOVE BIOGEL PI INDICATOR 7.5 (GLOVE) ×2
GLOVE ECLIPSE 7.5 STRL STRAW (GLOVE) ×2 IMPLANT
GLOVE SS BIOGEL STRL SZ 7 (GLOVE) ×1 IMPLANT
GLOVE SUPERSENSE BIOGEL SZ 7 (GLOVE) ×2
GLOVE SURG SS PI 7.0 STRL IVOR (GLOVE) ×1 IMPLANT
GLOVE SURG SS PI 7.5 STRL IVOR (GLOVE) ×1 IMPLANT
GOWN BRE IMP PREV XXLGXLNG (GOWN DISPOSABLE) ×1 IMPLANT
GOWN PREVENTION PLUS XLARGE (GOWN DISPOSABLE) ×2 IMPLANT
GOWN STRL NON-REIN LRG LVL3 (GOWN DISPOSABLE) ×6 IMPLANT
GOWN STRL REIN XL XLG (GOWN DISPOSABLE) ×1 IMPLANT
GRAFT BALLN CATH 65CM (STENTS) IMPLANT
GRAFT ENDOPROSETHESIS 26/14/12 (Endovascular Graft) ×1 IMPLANT
KIT BASIN OR (CUSTOM PROCEDURE TRAY) ×2 IMPLANT
KIT ROOM TURNOVER OR (KITS) ×2 IMPLANT
LEG CONTRALATERAL 27X12 (Vascular Products) ×1 IMPLANT
LEG CONTRALATERAL 27X14 (Vascular Products) ×1 IMPLANT
NAMIC PROTECTION STATION ×1 IMPLANT
NDL PERC 18GX7CM (NEEDLE) ×1 IMPLANT
NEEDLE PERC 18GX7CM (NEEDLE) ×2 IMPLANT
NS IRRIG 1000ML POUR BTL (IV SOLUTION) ×3 IMPLANT
PACK AORTA (CUSTOM PROCEDURE TRAY) ×2 IMPLANT
PAD ARMBOARD 7.5X6 YLW CONV (MISCELLANEOUS) ×4 IMPLANT
PENCIL BUTTON HOLSTER BLD 10FT (ELECTRODE) IMPLANT
PERCLOSE PROGLIDE 6F (VASCULAR PRODUCTS) ×14
SHEATH AVANTI 11CM 8FR (MISCELLANEOUS) ×1 IMPLANT
SHEATH BRITE TIP 8FR 23CM (MISCELLANEOUS) ×1 IMPLANT
SHEATH DRYSEAL FLEX 16FR 33CM (SHEATH) IMPLANT
SHEATH DRYSEAL FLEX 18FR 33CM (SHEATH) IMPLANT
SPONGE GAUZE 2X2 STER 10/PKG (GAUZE/BANDAGES/DRESSINGS) ×1
STENT GRAFT BALLN CATH 65CM (STENTS) ×1
STOPCOCK MORSE 400PSI 3WAY (MISCELLANEOUS) ×2 IMPLANT
SUT ETHILON 3 0 PS 1 (SUTURE) IMPLANT
SUT PROLENE 5 0 C 1 24 (SUTURE) IMPLANT
SUT PROLENE 5 0 CC 1 (SUTURE) IMPLANT
SUT PROLENE 6 0 C 1 30 (SUTURE) IMPLANT
SUT VIC AB 2-0 CT1 27 (SUTURE)
SUT VIC AB 2-0 CT1 TAPERPNT 27 (SUTURE) IMPLANT
SUT VIC AB 3-0 SH 27 (SUTURE)
SUT VIC AB 3-0 SH 27X BRD (SUTURE) IMPLANT
SUT VICRYL 4-0 PS2 18IN ABS (SUTURE) ×4 IMPLANT
SYR 20CC LL (SYRINGE) ×4 IMPLANT
SYR 30ML LL (SYRINGE) IMPLANT
SYR 5ML LL (SYRINGE) IMPLANT
SYR MEDRAD MARK V 150ML (SYRINGE) ×2 IMPLANT
SYRINGE 10CC LL (SYRINGE) ×4 IMPLANT
TOWEL OR 17X24 6PK STRL BLUE (TOWEL DISPOSABLE) ×4 IMPLANT
TOWEL OR 17X26 10 PK STRL BLUE (TOWEL DISPOSABLE) ×4 IMPLANT
TRAY FOLEY CATH 14FRSI W/METER (CATHETERS) ×2 IMPLANT
TUBING HIGH PRESSURE 120CM (CONNECTOR) ×2 IMPLANT
VANSCHIE5 CATHETER ×1 IMPLANT
WATER STERILE IRR 1000ML POUR (IV SOLUTION) ×1 IMPLANT
WIRE AMPLATZ SS-J .035X180CM (WIRE) ×2 IMPLANT
WIRE BENTSON .035X145CM (WIRE) ×2 IMPLANT

## 2012-09-25 NOTE — Progress Notes (Signed)
Utilization review completed.  

## 2012-09-25 NOTE — Anesthesia Postprocedure Evaluation (Signed)
  Anesthesia Post-op Note  Patient: [name]  Procedure(s) Performed: Procedure(s) (LRB) with comments: ABDOMINAL AORTIC ENDOVASCULAR STENT GRAFT (N/A) - Ultrasound guided. INTRAVASCULAR ULTRASOUND (N/A)  Patient Location: PACU  Anesthesia Type: General  Level of Consciousness: awake, alert  and oriented  Airway and Oxygen Therapy: Patient Spontanous Breathing and Patient connected to nasal cannula oxygen  Post-op Pain: mild  Post-op Assessment: Post-op Vital signs reviewed and Patient's Cardiovascular Status Stable  Post-op Vital Signs: stable  Complications: No apparent anesthesia complications

## 2012-09-25 NOTE — Progress Notes (Signed)
Pt admitted from PACU.  Denies any pain or other c/os..  Call bell within reach and oriented to room.

## 2012-09-25 NOTE — Anesthesia Preprocedure Evaluation (Addendum)
Anesthesia Evaluation  Patient identified by MRN, date of birth, ID band Patient awake    Reviewed: Allergy & Precautions, H&P , NPO status , Patient's Chart, lab work & pertinent test results, reviewed documented beta blocker date and time   Airway Mallampati: II TM Distance: >3 FB Neck ROM: Full    Dental  (+) Teeth Intact and Dental Advisory Given   Pulmonary  breath sounds clear to auscultation        Cardiovascular hypertension, Pt. on medications and Pt. on home beta blockers Rhythm:Regular     Neuro/Psych PSYCHIATRIC DISORDERS Anxiety Pt states that he has never had a stroke    GI/Hepatic   Endo/Other    Renal/GU Renal disease     Musculoskeletal   Abdominal   Peds  Hematology   Anesthesia Other Findings   Reproductive/Obstetrics                          Anesthesia Physical Anesthesia Plan  ASA: III  Anesthesia Plan: General   Post-op Pain Management:    Induction: Intravenous  Airway Management Planned: Oral ETT  Additional Equipment: Arterial line and CVP  Intra-op Plan:   Post-operative Plan: Extubation in OR  Informed Consent: I have reviewed the patients History and Physical, chart, labs and discussed the procedure including the risks, benefits and alternatives for the proposed anesthesia with the patient or authorized representative who has indicated his/her understanding and acceptance.   Dental advisory given  Plan Discussed with: CRNA and Anesthesiologist  Anesthesia Plan Comments:        Anesthesia Quick Evaluation

## 2012-09-25 NOTE — Op Note (Signed)
OPERATIVE REPORT  Date of Surgery: 09/25/2012  Surgeon: [name], MD  Assistant: Dr. Annamarie Major  Pre-op Diagnosis: Abdominal Aortic Aneurysm secondary to chronic dissection Post-op Diagnosis: Same Procedure: Procedure(s): #1 percutaneous access bilateral common femoral artery #2 bilateral femoral artery closure using Pro-glide device x5 #3 insertion aorta bi common iliac Gore  Excluder-C3 device       A-26 x 14.5 x 12 cm main body via left       B.-27 x 14 cm contralateral limb-right      C.-27 x 12 cm iliac extender-left #4 completion angiography INTRAVASCULAR ULTRASOUND  Anesthesia: General  EBL: [id number] cc  Complications: None  Procedure Details: Patient taken to the operating room placed in supine position at which time satisfactory general endotracheal anesthesia was administered. Appropriate monitoring lines were placed by anesthesia preoperatively including a radial arterial line and a large bore IV catheter in the internal jugular vein. Using SonoSite-ultrasound both common femoral arteries were entered percutaneously and tube Pro-glide suture closure devices were utilized on each side initially one at the 11:00 to 1 to 1:00 position. A long 8 French sheath was placed on the right and a short 8 [place] sheath passed on the left. The patient was then heparinized. Benson guidewire on the left tract what and what appeared to be the true lumen of the chronic dissection in the infrarenal aneurysm which terminated in the left common iliac artery. Using the eye this catheter over the Bentson wire this was confirmed and in fact we were in the true lumen throughout the case. Level of the renal arteries as well as the termination of the dissection was easily visualized with the eye this catheter. This catheter was then removed and a 18 French sheath was placed via the left side from the main body of the device. This was done over an Amplatz wire. After positioning in the appropriately a 16  French sheath was placed on the right side the pigtail catheter positioned in the suprarenal aorta. Renal arteries were identified injecting 10 cc of contrast at 10 cc per second. The graft was then deployed just distal to the lowest-left renal artery. It was in excellent position confirmed by a second angiogram. This was deployed down to the contralateral gate. Using the sheath the contralateral gate was cannulated without difficulty and confirmed using a pigtail catheter. Retrograde angiogram performed through the sheath on the right to determine the appropriate length of the iliac extender graft. We then selected a 27 x 14 cm graft which was deployed down to a position just proximal to the right hypogastric. The remainder of the main body of the graft was then deployed in a similar retrograde study performed through the left sheath and a 27 x 12 cm graft-iliac extender selected the left side. It was deployed appropriately. All junctions were then dilated with the q. 15:. Completion angiogram was then performed injecting 20 cc of contrast at 20 cc per second. Graft was in excellent position with good filling of both hypogastrics and no evidence of any endoleak. Following this sheaths were removed leading Benson wires in place and the artery was repaired using the Pro-glide suture devices placed earlier. One additional suture was placed a left-sided 12:00 position. Following securing of these suture devices and administration of protamine adequate hemostasis was achieved. The wounds were then closed in layers with Vicryl in a subcuticular fashion with Dermabond. Patient had 3+ dorsalis pedis pulses palpable at the end of the procedure. Having tolerated procedure well  patient was taken to the recovery room in stable condition   [name], MD 09/25/2012 11:25 AM

## 2012-09-25 NOTE — Transfer of Care (Signed)
Immediate Anesthesia Transfer of Care Note  Patient: [name]  Procedure(s) Performed: Procedure(s) (LRB) with comments: ABDOMINAL AORTIC ENDOVASCULAR STENT GRAFT (N/A) - Ultrasound guided. INTRAVASCULAR ULTRASOUND (N/A)  Patient Location: PACU  Anesthesia Type: General  Level of Consciousness: awake, alert  and oriented  Airway & Oxygen Therapy: Patient Spontanous Breathing and Patient connected to face mask oxygen  Post-op Assessment: Report given to PACU RN, Post -op Vital signs reviewed and stable and Patient moving all extremities X 4  Post vital signs: Reviewed and stable  Complications: No apparent anesthesia complications

## 2012-09-25 NOTE — OR Nursing (Signed)
Dr Linna Caprice notified/ will sign out pt

## 2012-09-25 NOTE — Anesthesia Procedure Notes (Signed)
Procedure Name: Intubation Date/Time: 09/25/2012 8:59 AM Performed by: Erik Obey Pre-anesthesia Checklist: Patient identified, Emergency Drugs available, Suction available, Patient being monitored and Timeout performed Patient Re-evaluated:Patient Re-evaluated prior to inductionOxygen Delivery Method: Circle system utilized Preoxygenation: Pre-oxygenation with 100% oxygen Intubation Type: IV induction Ventilation: Two handed mask ventilation required and Oral airway inserted - appropriate to patient size Laryngoscope Size: Mac and 3 Grade View: Grade II Tube type: Oral Tube size: 7.5 mm Number of attempts: 1 Airway Equipment and Method: Stylet Placement Confirmation: ETT inserted through vocal cords under direct vision,  positive ETCO2 and breath sounds checked- equal and bilateral Secured at: 23 cm Tube secured with: Tape Dental Injury: Teeth and Oropharynx as per pre-operative assessment

## 2012-09-25 NOTE — H&P (View-Only) (Signed)
Subjective:     Patient ID: [name], male   DOB: 1971/04/27, 41 y.o.   MRN: [id number]  HPI this 41 year old male returns today to discuss treatment of his infrarenal abdominal aortic aneurysm with infrarenal aortic dissection. He was previously scheduled to have the stent graft placed several months ago but developed a DVT in the right side after angiography. This is now resolved and he is off his [place]. He has had no chest or abdominal pain. Denies claudication symptoms but does not ambulate long distances.  Past Medical History  Diagnosis Date  . Chronic kidney disease   . Hypertension   . Stroke 2009  . History of pulmonary embolus (PE) 2010  . 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 Left ventricular hypertrophy   . Abdominal aortic aneurysm     History  Substance Use Topics  . Smoking status: Former Smoker -- 3 years    Types: Cigarettes    Quit date: 01/25/1991  . Smokeless tobacco: Never Used  . Alcohol Use: No    Family History  Problem Relation Age of Onset  . Hypertension Mother   . Hypertension Father     No Known Allergies  Current outpatient prescriptions:aspirin 81 MG tablet, Take 162 mg by mouth daily., Disp: , Rfl: ;  carvedilol (COREG) 25 MG tablet, Take 25 mg by mouth 2 (two) times daily with a meal. , Disp: , Rfl: ;  Olmesartan-Amlodipine-HCTZ (TRIBENZOR) 40-10-25 MG TABS, Take 1 tablet by mouth daily., Disp: , Rfl: ;  cloNIDine (CATAPRES) 0.2 MG tablet, Take 0.1 mg by mouth 3 (three) times daily. , Disp: , Rfl:  Rivaroxaban (XARELTO) 15 MG TABS tablet, Take 1 tablet (15 mg total) by mouth 2 (two) times daily., Disp: 60 tablet, Rfl: 6;  sildenafil (VIAGRA) 100 MG tablet, Take 0.5-1 tablets (50-100 mg total) by mouth daily as needed for erectile dysfunction., Disp: 5 tablet, Rfl: 11;  tadalafil (CIALIS) 20 MG tablet, Take 0.5-1 tablets (10-20 mg total) by mouth every other day as needed for erectile dysfunction., Disp:  5 tablet, Rfl: 11 vardenafil (LEVITRA) 10 MG tablet, Take 1 tablet (10 mg total) by mouth daily as needed for erectile dysfunction., Disp: 10 tablet, Rfl: 0 No current facility-administered medications for this visit. Facility-Administered Medications Ordered in Other Visits: iohexol (OMNIPAQUE) 350 MG/ML injection 80 mL, 80 mL, Intravenous, Once PRN, Areta Haber, MD, 80 mL at 09/03/12 1012  BP 129/89  Pulse 69  Resp 18  Ht 6' (1.829 m)  Wt 330 lb (149.687 kg)  BMI 44.76 kg/m2  Body mass index is 44.76 kg/(m^2).          Review of Systems denies chest pain, does complain of mild dyspnea on exertion. No swelling in lower extremities. Denies abdominal or back pain. Other systems negative and complete review of systems patient does have a history of a pulmonary embolus many years ago. Also had cardiac catheterization performed in 2010 by Dr. Alvester Chou with no evidence of coronary artery disease     Objective:   Physical Exam blood pressure 100/89 heart rate 69 respirations 18 Gen.-alert and oriented x3 in no apparent distress HEENT normal for age Lungs no rhonchi or wheezing Cardiovascular regular rhythm no murmurs carotid pulses 3+ palpable no bruits audible Abdomen soft nontender no palpable mass.-Morbidly obese Musculoskeletal free of  major deformities Skin clear -no rashes Neurologic normal Lower extremities 3+ femoral and dorsalis pedis pulses palpable bilaterally with no edema  Today I ordered a CT angiogram which are reviewed and interpreted by computer. The aneurysm is now 5.7 x 4.8 cm in maximum diameter. There is a dissection within the aneurysm of the infrarenal aorta which reenters into the left common iliac artery right common iliac artery widely patent.     Assessment:     #1 infrarenal abdominal aortic aneurysm with dissection  #2 history of DVT right leg and remote history PE #3 no coronary artery disease by cardiac catheterization in 2011    Plan:       Insertion of aortic stent graft on Wednesday, October 2. Risks and benefits of this have been thoroughly discussed with patient and he understands and would like to proceed

## 2012-09-25 NOTE — Preoperative (Signed)
Beta Blockers   Reason not to administer Beta Blockers:Not Applicable 

## 2012-09-25 NOTE — Interval H&P Note (Signed)
History and Physical Interval Note:  09/25/2012 8:33 AM  [name]  has presented today for surgery, with the diagnosis of AAA  The various methods of treatment have been discussed with the patient and family. After consideration of risks, benefits and other options for treatment, the patient has consented to  Procedure(s) (LRB) with comments: ABDOMINAL AORTIC ENDOVASCULAR STENT GRAFT (N/A) - GORE ALSO NEEDS IVUS; DR VWB TO ASSIST as a surgical intervention .  The patient's history has been reviewed, patient examined, no change in status, stable for surgery.  I have reviewed the patient's chart and labs.  Questions were answered to the patient's satisfaction.     [name]

## 2012-09-26 LAB — BASIC METABOLIC PANEL
CO2: 22 mEq/L (ref 19–32)
Chloride: 106 mEq/L (ref 96–112)
Creatinine, Ser: 1.87 mg/dL — ABNORMAL HIGH (ref 0.50–1.35)
Sodium: 137 mEq/L (ref 135–145)

## 2012-09-26 LAB — CBC
MCV: 78.9 fL (ref 78.0–100.0)
Platelets: 124 10*3/uL — ABNORMAL LOW (ref 150–400)
RBC: 3.6 MIL/uL — ABNORMAL LOW (ref 4.22–5.81)
WBC: 7.8 10*3/uL (ref 4.0–10.5)

## 2012-09-26 NOTE — Progress Notes (Addendum)
[facility] [place]  Post-op EVAR Date of Surgery: 09/25/2012 Surgeon: Surgeon(s): Mal [name], MD [name], MD POD: 1 Day Post-Op Device: insertion aorta bi common iliac Gore Excluder-C3 device  A-26 x 14.5 x 12 cm main body via left  B.-27 x 14 cm contralateral limb-right  C.-27 x 12 cm iliac extender-left  Endoleak: No:   History of Present Illness  [name] is a 41 y.o. male who is s/p EVAR. The patient denies back pain; denies abdominal pain; denies lower extremity pain.  He is Ambulating and taking PO well without nausea or vomiting. Pt. has voided with foley out. Pt had one vaso vagal episode last PM resolved quickly  IMAGING: Dg Chest Portable 1 View  09/25/2012  *RADIOLOGY REPORT*  Clinical Data: Stent graft placement  PORTABLE CHEST - 1 VIEW  Comparison: :  09/16/2012  Findings: The lungs are clear and fully expanded.  The extreme left lung base is not on the film.  No effusions or pneumothoraces.  The heart and mediastinal structures are normal.  IMPRESSION: No active disease.   Original Report Authenticated By: Joaquim Lai, M.D.    Dg Abd Portable 1v  09/25/2012  *RADIOLOGY REPORT*  Clinical Data: Stent graft placement  PORTABLE ABDOMEN - 1 VIEW  Comparison: CTA abdomen pelvis 09/03/2012.  Findings: Portable technique is suboptimal due to the patient's large size.  Patient is status post aorto iliac stent graft placement.  Grossly satisfactory position and alignment.  IMPRESSION: As above.   Original Report Authenticated By: Staci Righter, M.D.     Significant Diagnostic Studies: CBC Lab Results  Component Value Date   WBC 7.8 09/26/2012   HGB 9.4* 09/26/2012   HCT 28.4* 09/26/2012   MCV 78.9 09/26/2012   PLT 124* 09/26/2012     BMET    Component Value Date/Time   NA 137 09/26/2012 0415   K 4.1 09/26/2012 0415   CL 106 09/26/2012 0415   CO2 22 09/26/2012 0415   GLUCOSE 107* 09/26/2012 0415   BUN 24* 09/26/2012 0415     CREATININE 1.87* 09/26/2012 0415   CREATININE 1.94* 08/30/2012 0819   CALCIUM 8.8 09/26/2012 0415   GFRNONAA 42* 09/26/2012 0415   GFRAA 49* 09/26/2012 0415    COAG Lab Results  Component Value Date   INR 1.04 09/16/2012   INR 1.20 02/14/2012   INR 2.0 11/03/2009   No results found for this basename: PTT     I/O last 3 completed shifts: In: [id number] [P.O.:840; I.V.:3700; Other:150] Out: 1675 [Urine:1575; Blood:100] No data found.   Physical Examination  BP Readings from Last 3 Encounters:  09/26/12 136/81  09/26/12 136/81  09/16/12 140/90   Temp Readings from Last 3 Encounters:  09/26/12 98.7 F (37.1 C) Oral  09/26/12 98.7 F (37.1 C) Oral  09/16/12 97.8 F (36.6 C)    SpO2 Readings from Last 3 Encounters:  09/26/12 99%  09/26/12 99%  09/16/12 96%   Pulse Readings from Last 3 Encounters:  09/26/12 76  09/25/12 66  09/26/12 76    General: A&O x 3, WDWN male in NAD Gait: Normal Pulmonary: normal non-labored breathing  Cardiac: RRR Abdomen: soft, NT, NABS Bilateral groin wounds: clean, dry, intact, without hematoma Vascular Exam/Pulses:palpable DP pulses bilat  Extremities without ischemic changes, no Gangrene , no cellulitis; no open wounds;   Neurologic: A&O X 3; Appropriate Affect  Assessment: [name] is a 41 y.o. male who is 1 Day  Post-Op EVAR.  Pt is doing well with no complaints  Plan: Home  The importance of surveillance of the endograft was discussed with the patient  A CTA of abdomen and pelvis will be scheduled for one month to assess for endoleak.  The patient will follow up with [place] in one month with these studies.   SignedRichrd Prime [id number] 09/26/2012 7:51 AM.   Agree with above Abdomen soft no pulsatile mass noted Both inguinal wounds flat with no significant hematoma noted 3+ dorsalis pedis pulses palpable  Plan DC home today after patient ambulates-doing well

## 2012-09-26 NOTE — Discharge Summary (Signed)
[facility] Discharge Summary   Patient ID:  [name] MRN: [id number] DOB/AGE: 03-05-1971 41 y.o.  Admit date: 09/25/2012 Discharge date: 09/26/2012 Date of Surgery: 09/25/2012 Surgeon: Surgeon(s): Mal [name], MD [name], MD  Admission Diagnosis: AAA  Discharge Diagnoses:  AAA  Secondary Diagnoses: Past Medical History  Diagnosis Date  . Chronic kidney disease   . History of pulmonary embolus (PE) 2010  . 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 Left ventricular hypertrophy   . Abdominal aortic aneurysm   . Stroke 2009    pt says no stroke  . Hypertension     dr [name]  . Anxiety   . DVT (deep venous thrombosis)   . 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 He was previously scheduled to have the stent graft placed several months ago but developed a DVT in the right side after angiography. This is now resolved and he is off his [place]. He has had no chest or abdominal pain. Denies claudication symptoms but does not ambulate long distances. He is now admitted for EVAR  Hospital Course:  [name] is a 41 y.o. male is S/P Procedure(s): ABDOMINAL AORTIC ENDOVASCULAR STENT GRAFT INTRAVASCULAR ULTRASOUND Extubated: POD # 0 Post-op wounds healing well Pt. Ambulating, voiding and taking PO diet without difficulty. Pt pain controlled with PO pain meds. Labs as below Complications:none  Consults:     Significant Diagnostic Studies: CBC Lab Results  Component Value Date   WBC 7.8 09/26/2012   HGB 9.4* 09/26/2012   HCT 28.4* 09/26/2012   MCV 78.9 09/26/2012   PLT 124* 09/26/2012    BMET    Component Value Date/Time   NA 137 09/26/2012 0415   K 4.1 09/26/2012 0415   CL 106 09/26/2012 0415   CO2 22 09/26/2012 0415   GLUCOSE 107* 09/26/2012 0415   BUN 24* 09/26/2012 0415   CREATININE 1.87* 09/26/2012 0415   CREATININE 1.94* 08/30/2012 0819   CALCIUM 8.8 09/26/2012 0415   GFRNONAA 42* 09/26/2012 0415   GFRAA 49* 09/26/2012 0415   COAG Lab Results  Component Value Date   INR 1.04 09/16/2012   INR 1.20 02/14/2012   INR 2.0 11/03/2009     Disposition:  Discharge to :Home Discharge Orders    Future Appointments: Provider: Department: Dept Phone: Center:   10/22/2012 12:30 PM Mal [name], MD [facility] [phone] [facility]   11/29/2012 2:45 PM Marletta Lor, MD [facility] [phone] [facility]     Future Orders Please Complete By Expires   Resume previous diet      Driving Restrictions      Comments:   No driving for 3 weeks   Lifting restrictions      Comments:   No lifting for 4 weeks   Call MD for:  temperature >100.5      Call MD for:  redness, tenderness, or signs of infection (pain, swelling, bleeding, redness, odor or green/yellow discharge around incision site)      Call MD for:  severe or increased pain, loss or decreased feeling  in affected limb(s)      Increase activity slowly      Comments:   Walk with assistance use walker or cane as needed   May shower  Scheduling Instructions:   Friday   Remove dressing in 24 hours      may wash over wound with mild soap and water      ABDOMINAL PROCEDURE/ANEURYSM REPAIR/AORTO-BIFEMORAL BYPASS:  Call MD for increased abdominal pain; cramping diarrhea; nausea/vomiting         Adryel, Laible  Home Medication Instructions [id number]   Printed on:09/26/12 0757  Medication Information                    Olmesartan-Amlodipine-HCTZ (TRIBENZOR) 40-10-25 MG TABS Take 1 tablet by mouth daily.           carvedilol (COREG) 25 MG tablet Take 25 mg by mouth 2 (two) times daily with a meal.            aspirin EC 81 MG tablet Take 162 mg by mouth daily.             oxyCODONE-acetaminophen (ROXICET) 5-325 MG per tablet Take 1-2 tablets by mouth every 4 (four) hours as needed for pain.            Verbal and written Discharge instructions given to the patient. Wound care per Discharge AVS Follow-up Information    Follow up with [name], MD. In 1 month. (office will arrange - will need CTA prior to office visit - office to schedule)    Contact information:   [address] [place] [address] [phone]          Signed: Richrd Prime 09/26/2012, 7:57 AM

## 2012-09-26 NOTE — Op Note (Signed)
[facility] [place]  Patient name: [name] MRN: [id number] DOB: 1971/07/06 Sex: male  09/25/2012 Pre-operative Diagnosis: Abdominal aortic aneurysm Post-operative diagnosis:  Same Surgeon:  Eldridge Abrahams Co-surgeon:  Victorino Dike Procedure:   Endovascular repair of abdominal aortic aneurysm Anesthesia:  Gen. Blood Loss:  See anesthesia record Specimens:  None  Findings:  Complete exclusion of the dissection.  Indications:  The patient has a infrarenal aortic dissection with aneurysmal changes. He comes in today for repair  Procedure:  The patient was identified in the holding area and taken to [facility]  The patient was then placed supine on the table. general anesthesia was administered.  The patient was prepped and draped in the usual sterile fashion.  A time out was called and antibiotics were administered.  Please see Dr. Evelena Leyden note for full details of the procedure. Bilateral percutaneous access was achieved. IVUS was used to confirm that we were within the true lumen. A Gore Excluder device was placed just below the level of the renal arteries. Distal extension was placed on the right side. Completion angiogram revealed successful exclusion. The groins were closed with a pro-glide devices. An additional device had to be placed on the left. There was mild hematoma which was not expanding on the left. There were no complications.   Disposition:  To PACU in stable condition.   Theotis Burrow, M.D. [facility] [place] Office: [phone] Pager:  [phone]

## 2012-09-26 NOTE — Progress Notes (Signed)
Patient being discharged home per MD order.All discharge instructions given to patient and family.

## 2012-09-27 ENCOUNTER — Encounter (HOSPITAL_COMMUNITY): Payer: Self-pay | Admitting: Vascular Surgery

## 2012-09-27 ENCOUNTER — Telehealth: Payer: Self-pay | Admitting: Vascular Surgery

## 2012-09-27 NOTE — Telephone Encounter (Addendum)
Message copied by Doristine Section on Fri Sep 27, 2012 10:49 AM ------      Message from: [place], [place] K      Created: Wed Sep 25, 2012  1:50 PM      Regarding: schedule                   ----- Message -----         From: Alfonso Patten, RN         Sent: 09/25/2012  11:55 AM           To: Mena Goes, CMA, [facility]            i THINK THIS IS 4 WEEK F/U      ----- Message -----         From: Richrd Prime, PA         Sent: 09/25/2012  11:24 AM           To: Alfonso Patten, RN            $weeks F/U CTA abd/pelvis- S/P EVAR - [facility]        notified patient of fu appt. with jdl on 10-22-12 cta scheduled for 8:45 and then to see md at 12:30 - unable to reach patient by phone mailed appt. letter

## 2012-10-15 ENCOUNTER — Telehealth: Payer: Self-pay

## 2012-10-15 DIAGNOSIS — IMO0001 Reserved for inherently not codable concepts without codable children: Secondary | ICD-10-CM

## 2012-10-15 NOTE — Telephone Encounter (Signed)
Phone call from pt.  Stated he had onset of a "dull pain in right lower leg from knee, down to foot", when he woke up this morning.  Described that the dull pain in the right leg was first noticed, and then the pain in the sole of his right foot was present when he stood w/ full weight-bearing. Denies any swelling.  Pt stated that this has occurred about 2-3 times over the past 1-2 years.  The episode this morning is the 1st time it has occurred in several months.  Stated his wife encouraged him to report this, since he had surgery recently.  Re: recent EVAR for AAA, pt. Denies any symptoms with  Abdominal, back, or groin pain.  Will report right leg/ foot symptoms to Dr. Kellie Simmering.

## 2012-10-15 NOTE — Telephone Encounter (Signed)
Discussed pt's symptoms with Dr. Kellie Simmering.  Advised to reassure pt. That the symptoms don't correlate with the recent Stent graft repair.  Rec'd verbal order to schedule pt. For ABI's at office appt. On 10/22/12.  Phone call to wife.  Informed of Dr. Evelena Leyden comment/recommendation.  Verb. Understanding.

## 2012-10-16 NOTE — Telephone Encounter (Signed)
I scheduled an appt for the patient to have abi's prior to seeing JDL on 10/22/12. I left a message for the pt [id number] time change. awt

## 2012-10-21 ENCOUNTER — Encounter: Payer: Self-pay | Admitting: Vascular Surgery

## 2012-10-22 ENCOUNTER — Ambulatory Visit
Admission: RE | Admit: 2012-10-22 | Discharge: 2012-10-22 | Disposition: A | Discharge status: Home / Self Care | Payer: Managed Care, Other (non HMO) | Source: Ambulatory Visit | Attending: Vascular Surgery | Admitting: Vascular Surgery

## 2012-10-22 ENCOUNTER — Ambulatory Visit (INDEPENDENT_AMBULATORY_CARE_PROVIDER_SITE_OTHER): Payer: Managed Care, Other (non HMO) | Admitting: Vascular Surgery

## 2012-10-22 ENCOUNTER — Encounter (INDEPENDENT_AMBULATORY_CARE_PROVIDER_SITE_OTHER): Payer: Managed Care, Other (non HMO) | Admitting: *Deleted

## 2012-10-22 ENCOUNTER — Encounter: Payer: Self-pay | Admitting: Vascular Surgery

## 2012-10-22 VITALS — BP 119/73 | HR 86 | Temp 98.4°F | Ht 72.0 in | Wt 351.0 lb

## 2012-10-22 DIAGNOSIS — Z48812 Encounter for surgical aftercare following surgery on the circulatory system: Secondary | ICD-10-CM

## 2012-10-22 DIAGNOSIS — IMO0001 Reserved for inherently not codable concepts without codable children: Secondary | ICD-10-CM

## 2012-10-22 DIAGNOSIS — I714 Abdominal aortic aneurysm, without rupture, unspecified: Secondary | ICD-10-CM

## 2012-10-22 DIAGNOSIS — M79609 Pain in unspecified limb: Secondary | ICD-10-CM

## 2012-10-22 MED ORDER — IOHEXOL 350 MG/ML SOLN
75.0000 mL | Freq: Once | INTRAVENOUS | Status: AC | PRN
  Administered 2012-10-22: 75 mL via INTRAVENOUS

## 2012-10-22 NOTE — Progress Notes (Signed)
Subjective:     Patient ID: [name], male   DOB: 1971/11/20, 41 y.o.   MRN: [id number]  HPI this 41 year old morbidly obese male returns 4 weeks post endovascular repair of infrarenal aneurysm caused by spontaneous aortic dissection into the left common iliac artery he has done well since his discharge from the hospital. The procedure was performed percutaneously. He has had no abdominal or back symptoms. His appetite and bowel habits are normal. He is increasing his activity.   Review of Systems     Objective:   Physical ExamBP 119/73  Pulse 86  Temp 98.4 F (36.9 C) (Oral)  Ht 6' (1.829 m)  Wt 351 lb (159.213 kg)  BMI 47.60 kg/m2  SpO2 100%  Gen. morbidly obese middle-aged male in no apparent distress alert and oriented x3 Lungs no rhonchi or wheezing Abdomen obese no palpable masses 3+ femoral dorsalis pedis pulses palpable bilaterally  Today I ordered a CT angiogram which are reviewed by computer. The graft is in excellent position with no evidence of endoleak or migration of the graft. Aneurysm is slightly smaller than previous study a CT scan.    Assessment:     Successful EVAR for infrarenal aortic aneurysm caused by spontaneous dissection    Plan:     Return in 6 months with repeat CT angiogram to follow stent graft

## 2012-10-23 NOTE — Addendum Note (Signed)
Addended by: Thresa Ross C on: 10/23/2012 12:52 PM   Modules accepted: Orders

## 2012-11-29 ENCOUNTER — Encounter: Payer: Self-pay | Admitting: Internal Medicine

## 2012-11-29 ENCOUNTER — Ambulatory Visit (INDEPENDENT_AMBULATORY_CARE_PROVIDER_SITE_OTHER): Payer: Managed Care, Other (non HMO) | Admitting: Internal Medicine

## 2012-11-29 VITALS — BP 120/80 | HR 76 | Temp 98.0°F | Resp 18 | Ht 71.5 in | Wt 348.0 lb

## 2012-11-29 DIAGNOSIS — I714 Abdominal aortic aneurysm, without rupture, unspecified: Secondary | ICD-10-CM

## 2012-11-29 DIAGNOSIS — N289 Disorder of kidney and ureter, unspecified: Secondary | ICD-10-CM

## 2012-11-29 DIAGNOSIS — I10 Essential (primary) hypertension: Secondary | ICD-10-CM

## 2012-11-29 DIAGNOSIS — E669 Obesity, unspecified: Secondary | ICD-10-CM

## 2012-11-29 MED ORDER — CARVEDILOL 25 MG PO TABS: 25.0000 mg | ORAL_TABLET | Freq: Two times a day (BID) | ORAL | Status: DC

## 2012-11-29 MED ORDER — ATORVASTATIN CALCIUM 10 MG PO TABS: 10.0000 mg | ORAL_TABLET | Freq: Every day | ORAL | Status: DC

## 2012-11-29 MED ORDER — OLMESARTAN-AMLODIPINE-HCTZ 40-10-25 MG PO TABS: 1.0000 | ORAL_TABLET | Freq: Every day | ORAL | Status: DC

## 2012-11-29 NOTE — Patient Instructions (Addendum)
Limit your sodium (Salt) intake  Please check your blood pressure on a regular basis.  If it is consistently greater than 150/90, please make an office appointment.  You need to lose weight.  Consider a lower calorie diet and regular exercise.

## 2012-11-29 NOTE — Progress Notes (Signed)
Subjective:    Patient ID: [name], male    DOB: [birth year], 41 y.o.   MRN: [id number]  HPI 41 year old patient who is seen today to establish with this practice location. He has been disabled since 2009. Medical problems include peripheral vascular disease. He is status post endovascular repair of a AAA in October of this year and has done quite well. He has a history of exogenous obesity hypertension and LVH. Past medical history is remarkable for a history of hypertension and LVH. He has a prior history of DVT and pulmonary embolism. He is followed by nephrology do to chronic kidney disease on an annual basis    Family history father age 56 history of hypertension Mother history of asthma hypertension 3 brothers one sister in good health  Social history married remote tobacco  Past Medical History  Diagnosis Date  . Chronic kidney disease   . History of pulmonary embolus (PE) 2010  . 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 Left ventricular hypertrophy   . Abdominal aortic aneurysm   . Stroke 2009    pt says no stroke  . Hypertension     dr [name]  . Anxiety   . DVT (deep venous thrombosis)   . Obesity     History   Social History  . Marital Status: Married    Spouse Name: N/A    Number of Children: N/A  . Years of Education: N/A   Occupational History  . Not on file.   Social History Main Topics  . Smoking status: Former Smoker -- 3 years    Types: Cigarettes    Quit date: 01/25/1991  . Smokeless tobacco: Never Used  . Alcohol Use: No  . Drug Use: No  . Sexually Active: Not Currently -- Male partner(s)   Other Topics Concern  . Not on file   Social History Narrative  . No narrative on file    Past Surgical History  Procedure Date  . Cardiac catheterization   . Corneal transplant   . Intravascular ultrasound 09/25/2012    Procedure: INTRAVASCULAR ULTRASOUND;  Surgeon: Mal [name], MD;  Location: [facility];   Service: Vascular;  Laterality: N/A;  . Abdominal aortic aneurysm repair 09/25/2012    EVAR    Family History  Problem Relation Age of Onset  . Hypertension Mother   . Hypertension Father     No Known Allergies  Current Outpatient Prescriptions on File Prior to Visit  Medication Sig Dispense Refill  . aspirin EC 81 MG tablet Take 162 mg by mouth daily.      . carvedilol (COREG) 25 MG tablet Take 25 mg by mouth 2 (two) times daily with a meal.       . Olmesartan-Amlodipine-HCTZ (TRIBENZOR) 40-10-25 MG TABS Take 1 tablet by mouth daily.      [name] oxyCODONE-acetaminophen (ROXICET) 5-325 MG per tablet Take 1-2 tablets by mouth every 4 (four) hours as needed for pain.  30 tablet  0    BP 120/80  Pulse 76  Temp 98 F (36.7 C) (Oral)  Resp 18  Ht 5' 11.5" (1.816 m)  Wt 348 lb (157.852 kg)  BMI 47.86 kg/m2     Review of Systems  Constitutional: Negative for fever, chills, appetite change and fatigue.  HENT: Negative for hearing loss, ear pain, congestion, sore throat, trouble swallowing, neck stiffness, dental problem, voice change and tinnitus.   Eyes: Negative for pain, discharge and visual disturbance.  Respiratory: Negative for cough, chest tightness, wheezing and stridor.   Cardiovascular: Negative for chest pain, palpitations and leg swelling.  Gastrointestinal: Negative for nausea, vomiting, abdominal pain, diarrhea, constipation, blood in stool and abdominal distention.  Genitourinary: Negative for urgency, hematuria, flank pain, discharge, difficulty urinating and genital sores.  Musculoskeletal: Negative for myalgias, back pain, joint swelling, arthralgias and gait problem.  Skin: Negative for rash.  Neurological: Positive for weakness. Negative for dizziness, syncope, speech difficulty, numbness and headaches.  Hematological: Negative for adenopathy. Does not bruise/bleed easily.  Psychiatric/Behavioral: Negative for behavioral problems and dysphoric mood. The patient is  not nervous/anxious.        Objective:   Physical Exam  Constitutional: He appears well-developed and well-nourished.       Morbidly obese Blood pressure 110/70  HENT:  Head: Normocephalic and atraumatic.  Right Ear: External ear normal.  Left Ear: External ear normal.  Nose: Nose normal.  Mouth/Throat: Oropharynx is clear and moist.       Low hanging soft palate with pharyngeal crowding Status post left corneal transplant Right cataract  Eyes: Conjunctivae normal and EOM are normal. Pupils are equal, round, and reactive to light. No scleral icterus.  Neck: Normal range of motion. Neck supple. No JVD present. No thyromegaly present.  Cardiovascular: Normal rate, regular rhythm, normal heart sounds and intact distal pulses.  Exam reveals no gallop and no friction rub.   No murmur heard.      Pedal pulses are intact  Pulmonary/Chest: Effort normal and breath sounds normal. He exhibits no tenderness.  Abdominal: Soft. Bowel sounds are normal. He exhibits no distension and no mass. There is no tenderness.  Genitourinary: Prostate normal and penis normal.  Musculoskeletal: Normal range of motion. He exhibits no edema and no tenderness.  Lymphadenopathy:    He has no cervical adenopathy.  Neurological: He is alert. He has normal reflexes. No cranial nerve deficit. Coordination normal.  Skin: Skin is warm and dry. No rash noted.  Psychiatric: He has a normal mood and affect. His behavior is normal.          Assessment & Plan:  Hypertensive cardiovascular disease Hypertension well controlled Status post endovascular repair of AAA Chronic kidney disease Morbid obesity OSA suspect. Declines evaluation  Statin therapy will be considered Weight loss encouraged Recheck 6 months Medications refilled

## 2013-01-01 ENCOUNTER — Ambulatory Visit (INDEPENDENT_AMBULATORY_CARE_PROVIDER_SITE_OTHER): Payer: Managed Care, Other (non HMO) | Admitting: Internal Medicine

## 2013-01-01 ENCOUNTER — Encounter: Payer: Self-pay | Admitting: Internal Medicine

## 2013-01-01 VITALS — BP 132/90 | HR 68 | Temp 98.0°F | Resp 18 | Wt 357.4 lb

## 2013-01-01 DIAGNOSIS — R21 Rash and other nonspecific skin eruption: Secondary | ICD-10-CM

## 2013-01-01 DIAGNOSIS — L509 Urticaria, unspecified: Secondary | ICD-10-CM

## 2013-01-01 DIAGNOSIS — I10 Essential (primary) hypertension: Secondary | ICD-10-CM

## 2013-01-01 MED ORDER — METHYLPREDNISOLONE ACETATE 80 MG/ML IJ SUSP
80.0000 mg | Freq: Once | INTRAMUSCULAR | Status: AC
  Administered 2013-01-01: 80 mg via INTRAMUSCULAR

## 2013-01-01 NOTE — Patient Instructions (Signed)
Consider Benadryl for a nonsedating antihistamine such as Allegra  Call or return to clinic prn if these symptoms worsen or fail to improve as anticipated.

## 2013-01-01 NOTE — Progress Notes (Signed)
Subjective:    Patient ID: [name], male    DOB: 22-Sep-1971, 42 y.o.   MRN: [id number]  HPI 42 year old patient presents with a one-day history of hives.  He has changed soaps and states he has had some sensitivity to variety of soaps in the past. He also has obtained a gift car to a local [employer] and has been the eating there frequently especially shrimp. No wheezing or pulmonary complaints. No tongue swelling. Otherwise done quite well. Stable medical problems include hypertension dyslipidemia chronic kidney disease.  Past Medical History  Diagnosis Date  . Chronic kidney disease   . History of pulmonary embolus (PE) 2010  . 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 Left ventricular hypertrophy   . Abdominal aortic aneurysm   . Stroke 2009    pt says no stroke  . Hypertension     dr [name]  . Anxiety   . DVT (deep venous thrombosis)   . Obesity     History   Social History  . Marital Status: Married    Spouse Name: N/A    Number of Children: N/A  . Years of Education: N/A   Occupational History  . Not on file.   Social History Main Topics  . Smoking status: Former Smoker -- 3 years    Types: Cigarettes    Quit date: 01/25/1991  . Smokeless tobacco: Never Used  . Alcohol Use: No  . Drug Use: No  . Sexually Active: Not Currently -- Male partner(s)   Other Topics Concern  . Not on file   Social History Narrative  . No narrative on file    Past Surgical History  Procedure Date  . Cardiac catheterization   . Corneal transplant   . Intravascular ultrasound 09/25/2012    Procedure: INTRAVASCULAR ULTRASOUND;  Surgeon: Jack [name], MD;  Location: [facility];  Service: Vascular;  Laterality: N/A;  . Abdominal aortic aneurysm repair 09/25/2012    EVAR    Family History  Problem Relation Age of Onset  . Hypertension Mother   . Hypertension Father     No Known Allergies  Current Outpatient Prescriptions on  File Prior to Visit  Medication Sig Dispense Refill  . aspirin EC 81 MG tablet Take 162 mg by mouth daily.      [name] atorvastatin (LIPITOR) 10 MG tablet Take 1 tablet (10 mg total) by mouth daily.  90 tablet  3  . carvedilol (COREG) 25 MG tablet Take 1 tablet (25 mg total) by mouth 2 (two) times daily with a meal.  180 tablet  6  . Olmesartan-Amlodipine-HCTZ (TRIBENZOR) 40-10-25 MG TABS Take 1 tablet by mouth daily.  90 tablet  6  . paricalcitol (ZEMPLAR) 1 MCG capsule Take 1 mcg by mouth daily. Takes Mon, [place] and Friday.      [name] oxyCODONE-acetaminophen (ROXICET) 5-325 MG per tablet Take 1-2 tablets by mouth every 4 (four) hours as needed for pain.  30 tablet  0    BP 132/90  Pulse 68  Temp 98 F (36.7 C) (Oral)  Resp 18  Wt 357 lb 6.4 oz (162.116 kg)      Review of Systems  Constitutional: Negative for fever, chills, appetite change and fatigue.  HENT: Negative for hearing loss, ear pain, congestion, sore throat, trouble swallowing, neck stiffness, dental problem, voice change and tinnitus.   Eyes: Negative for pain, discharge and visual disturbance.  Respiratory: Negative for cough, chest tightness, wheezing  and stridor.   Cardiovascular: Negative for chest pain, palpitations and leg swelling.  Gastrointestinal: Negative for nausea, vomiting, abdominal pain, diarrhea, constipation, blood in stool and abdominal distention.  Genitourinary: Negative for urgency, hematuria, flank pain, discharge, difficulty urinating and genital sores.  Musculoskeletal: Negative for myalgias, back pain, joint swelling, arthralgias and gait problem.  Skin: Positive for rash.  Neurological: Negative for dizziness, syncope, speech difficulty, weakness, numbness and headaches.  Hematological: Negative for adenopathy. Does not bruise/bleed easily.  Psychiatric/Behavioral: Negative for behavioral problems and dysphoric mood. The patient is not nervous/anxious.        Objective:   Physical Exam    Constitutional: He appears well-developed and well-nourished. No distress.       Blood pressure 130/86  Pulmonary/Chest: No respiratory distress. He has no wheezes. He has no rales. He exhibits no tenderness.  Skin:       Scattered urticarial rash          Assessment & Plan:    hives. Unclear etiology the patient was asked to resume using a prior soaps and detergents with a good track record. It is likely that hives may be related to recent consumption of [place] food will treat with Depo-Medrol and observe oral antihistamines will also be considered  Hypertension stable

## 2013-02-17 ENCOUNTER — Telehealth: Payer: Self-pay | Admitting: Internal Medicine

## 2013-02-17 NOTE — Telephone Encounter (Signed)
Spoke to pt told him we do not have any samples of medications. Pt verbalized understanding.

## 2013-02-17 NOTE — Telephone Encounter (Signed)
Pt would like samples of lipitor 10 mg and tribenzor 40-10-25 mg.

## 2013-03-12 ENCOUNTER — Encounter: Payer: Self-pay | Admitting: *Deleted

## 2013-04-18 ENCOUNTER — Other Ambulatory Visit: Payer: Self-pay | Admitting: Vascular Surgery

## 2013-04-18 LAB — BUN: BUN: 26 mg/dL — ABNORMAL HIGH (ref 6–23)

## 2013-04-18 LAB — CREATININE, SERUM: Creat: 2.13 mg/dL — ABNORMAL HIGH (ref 0.50–1.35)

## 2013-04-21 ENCOUNTER — Encounter: Payer: Self-pay | Admitting: Vascular Surgery

## 2013-04-22 ENCOUNTER — Ambulatory Visit: Payer: Managed Care, Other (non HMO) | Admitting: Vascular Surgery

## 2013-04-22 ENCOUNTER — Telehealth: Payer: Self-pay | Admitting: Vascular Surgery

## 2013-04-22 ENCOUNTER — Inpatient Hospital Stay: Admission: RE | Admit: 2013-04-22 | Payer: Managed Care, Other (non HMO) | Source: Ambulatory Visit

## 2013-04-22 NOTE — Telephone Encounter (Signed)
lvm for pt re appt and CTA change and to get labs day before, order faxed to [facility]

## 2013-05-02 ENCOUNTER — Telehealth: Payer: Self-pay | Admitting: Internal Medicine

## 2013-05-02 NOTE — Telephone Encounter (Signed)
Pt stopped by and picked up samples.

## 2013-05-02 NOTE — Telephone Encounter (Signed)
Left detailed message we do have samples one month supply put at front desk for him.

## 2013-05-02 NOTE — Telephone Encounter (Signed)
Patient called stating that he would like samples of tribenzor 40/10/25mg  1 poqd. Please assist.

## 2013-05-05 ENCOUNTER — Other Ambulatory Visit: Payer: Self-pay | Admitting: Vascular Surgery

## 2013-05-05 ENCOUNTER — Encounter: Payer: Self-pay | Admitting: Vascular Surgery

## 2013-05-06 ENCOUNTER — Ambulatory Visit: Payer: Managed Care, Other (non HMO) | Admitting: Vascular Surgery

## 2013-05-06 ENCOUNTER — Ambulatory Visit
Admission: RE | Admit: 2013-05-06 | Discharge: 2013-05-06 | Disposition: A | Discharge status: Home / Self Care | Payer: Managed Care, Other (non HMO) | Source: Ambulatory Visit | Attending: Vascular Surgery | Admitting: Vascular Surgery

## 2013-05-06 DIAGNOSIS — I714 Abdominal aortic aneurysm, without rupture, unspecified: Secondary | ICD-10-CM

## 2013-05-06 DIAGNOSIS — Z48812 Encounter for surgical aftercare following surgery on the circulatory system: Secondary | ICD-10-CM

## 2013-05-12 ENCOUNTER — Other Ambulatory Visit: Payer: Self-pay | Admitting: *Deleted

## 2013-05-12 DIAGNOSIS — Z48812 Encounter for surgical aftercare following surgery on the circulatory system: Secondary | ICD-10-CM

## 2013-05-12 DIAGNOSIS — I714 Abdominal aortic aneurysm, without rupture, unspecified: Secondary | ICD-10-CM

## 2013-05-26 ENCOUNTER — Ambulatory Visit
Admission: RE | Admit: 2013-05-26 | Discharge: 2013-05-26 | Disposition: A | Discharge status: Home / Self Care | Payer: Managed Care, Other (non HMO) | Source: Ambulatory Visit | Attending: Vascular Surgery | Admitting: Vascular Surgery

## 2013-05-26 ENCOUNTER — Encounter: Payer: Self-pay | Admitting: Vascular Surgery

## 2013-05-26 DIAGNOSIS — I714 Abdominal aortic aneurysm, without rupture, unspecified: Secondary | ICD-10-CM

## 2013-05-26 DIAGNOSIS — Z48812 Encounter for surgical aftercare following surgery on the circulatory system: Secondary | ICD-10-CM

## 2013-05-27 ENCOUNTER — Encounter: Payer: Self-pay | Admitting: Vascular Surgery

## 2013-05-27 ENCOUNTER — Ambulatory Visit (INDEPENDENT_AMBULATORY_CARE_PROVIDER_SITE_OTHER): Payer: Managed Care, Other (non HMO) | Admitting: Vascular Surgery

## 2013-05-27 VITALS — BP 129/78 | HR 76 | Resp 18 | Ht 71.0 in | Wt 348.0 lb

## 2013-05-27 DIAGNOSIS — I714 Abdominal aortic aneurysm, without rupture, unspecified: Secondary | ICD-10-CM

## 2013-05-27 DIAGNOSIS — Z48812 Encounter for surgical aftercare following surgery on the circulatory system: Secondary | ICD-10-CM

## 2013-05-27 NOTE — Progress Notes (Signed)
Subjective:     Patient ID: [name], male   DOB: 1971-10-31, 42 y.o.   MRN: [id number]  HPI this 42 year old male returns for further followup regarding his aortic stent graft for an infrarenal abdominal aortic aneurysm performed in October of 2013. He does have some mild renal insufficiency soak no contrast was utilized for his CT scan. He denies any abdominal or back symptoms that are new. He does have bilateral edema. Lasix was prescribed by Dr. Hassell Done web but he is not taking any yet.  Past Medical History  Diagnosis Date  . Chronic kidney disease   . History of pulmonary embolus (PE) 2010  . 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 Left ventricular hypertrophy   . Abdominal aortic aneurysm   . Stroke 2009    pt says no stroke  . Hypertension     dr [name]  . Anxiety   . DVT (deep venous thrombosis)   . Obesity   . Chronic renal disease     advanced  . Aortic regurgitation 12/10/08    Echo: mild to mod. AOV regurg,mild AO root dilatation, LA mildly dilated, EF 65%, LV wall thickness markedly increased, small PE    History  Substance Use Topics  . Smoking status: Former Smoker -- 3 years    Types: Cigarettes    Quit date: 01/25/1991  . Smokeless tobacco: Never Used  . Alcohol Use: No    Family History  Problem Relation Age of Onset  . Hypertension Mother   . Hypertension Father     No Known Allergies  Current outpatient prescriptions:aspirin EC 81 MG tablet, Take 162 mg by mouth daily., Disp: , Rfl: ;  atorvastatin (LIPITOR) 10 MG tablet, Take 1 tablet (10 mg total) by mouth daily., Disp: 90 tablet, Rfl: 3;  carvedilol (COREG) 25 MG tablet, Take 1 tablet (25 mg total) by mouth 2 (two) times daily with a meal., Disp: 180 tablet, Rfl: 6;  Olmesartan-Amlodipine-HCTZ (TRIBENZOR) 40-10-25 MG TABS, Take 1 tablet by mouth daily., Disp: 90 tablet, Rfl: 6 oxyCODONE-acetaminophen (ROXICET) 5-325 MG per tablet, Take 1-2 tablets by mouth  every 4 (four) hours as needed for pain., Disp: 30 tablet, Rfl: 0;  paricalcitol (ZEMPLAR) 1 MCG capsule, Take 1 mcg by mouth daily. Takes Mon, Wed and Friday., Disp: , Rfl:   BP 129/78  Pulse 76  Resp 18  Ht 5\' 11"  (1.803 m)  Wt 348 lb (157.852 kg)  BMI 48.56 kg/m2  Body mass index is 48.56 kg/(m^2).          Review of Systems denies chest pain, dyspnea on exertion, PND, orthopnea, claudication. Does complain of bilateral lower extremity edema. No lateralizing weakness or aphasia or amaurosis fugax     Objective:   Physical Exam blood pressure 129/78 heart rate 76 respirations 18 Gen.-alert and oriented x3 in no apparent distress morbidly obese HEENT normal for age Lungs no rhonchi or wheezing Cardiovascular regular rhythm no murmurs carotid pulses 3+ palpable no bruits audible Abdomen soft nontender no palpable masses-obese Musculoskeletal free of  major deformities Skin clear -no rashes Neurologic normal Lower extremities 3+ femoral and dorsalis pedis pulses palpable bilaterally with 1+ edema bilaterally  I ordered a CT scan with no contrast of the abdomen and pelvis. The aneurysm diameter has decreased from 5.2 preoperatively to 3.4 cm. Unable to assess and no leaks since we used no contrast. The graft was in good position.      Assessment:  Doing well 8 months post endovascular stent repair of infrarenal abdominal aortic aneurysm with contraction of the aneurysm size from 5.2-3.4 cm    Plan:     Return in 6 months with CT scan of the abdomen and pelvis with no contrast for continued followup

## 2013-05-28 NOTE — Addendum Note (Signed)
Addended by: Dorthula Rue L on: 05/28/2013 01:42 PM   Modules accepted: Orders

## 2013-05-29 ENCOUNTER — Other Ambulatory Visit: Payer: Self-pay | Admitting: Cardiovascular Disease

## 2013-06-03 ENCOUNTER — Telehealth: Payer: Self-pay | Admitting: Internal Medicine

## 2013-06-03 NOTE — Telephone Encounter (Signed)
Spoke to pt told him I only have 7 tablets for him that is all we have right now. Pt verbalized understanding and stated will come by Thurs to pick up. Told him that is fine.

## 2013-06-03 NOTE — Telephone Encounter (Signed)
Pt calling to see if we have any samples of Olmesartan-Amlodipine-HCTZ (TRIBENZOR) 40-10-25 MG TABS. Please assist.

## 2013-07-01 ENCOUNTER — Telehealth: Payer: Self-pay | Admitting: Internal Medicine

## 2013-07-01 MED ORDER — CARVEDILOL 25 MG PO TABS: ORAL_TABLET | ORAL | Status: DC

## 2013-07-01 NOTE — Telephone Encounter (Signed)
Spoke to pt told him Rx refill sent to pharmacy needs to make follow up appt. Pt verbalized understanding.

## 2013-07-01 NOTE — Telephone Encounter (Signed)
Pt requesting 30-day supply refill on his carvedilol (COREG) 25 MG tablet Pt uses [facility] on [address].

## 2013-07-30 ENCOUNTER — Encounter: Payer: Self-pay | Admitting: Internal Medicine

## 2013-07-30 ENCOUNTER — Ambulatory Visit (INDEPENDENT_AMBULATORY_CARE_PROVIDER_SITE_OTHER): Payer: Managed Care, Other (non HMO) | Admitting: Internal Medicine

## 2013-07-30 ENCOUNTER — Encounter: Payer: Self-pay | Admitting: Physician Assistant

## 2013-07-30 ENCOUNTER — Ambulatory Visit (INDEPENDENT_AMBULATORY_CARE_PROVIDER_SITE_OTHER): Payer: Managed Care, Other (non HMO) | Admitting: Physician Assistant

## 2013-07-30 ENCOUNTER — Other Ambulatory Visit: Payer: Self-pay | Admitting: *Deleted

## 2013-07-30 VITALS — BP 130/90 | HR 78 | Temp 98.3°F | Resp 20 | Wt 375.7 lb

## 2013-07-30 VITALS — BP 120/80 | HR 77 | Ht 71.0 in | Wt 375.0 lb

## 2013-07-30 DIAGNOSIS — R600 Localized edema: Secondary | ICD-10-CM

## 2013-07-30 DIAGNOSIS — I10 Essential (primary) hypertension: Secondary | ICD-10-CM

## 2013-07-30 DIAGNOSIS — R609 Edema, unspecified: Secondary | ICD-10-CM

## 2013-07-30 DIAGNOSIS — R0789 Other chest pain: Secondary | ICD-10-CM

## 2013-07-30 DIAGNOSIS — N289 Disorder of kidney and ureter, unspecified: Secondary | ICD-10-CM

## 2013-07-30 DIAGNOSIS — R6889 Other general symptoms and signs: Secondary | ICD-10-CM

## 2013-07-30 MED ORDER — FUROSEMIDE 20 MG PO TABS: 40.0000 mg | ORAL_TABLET | ORAL | Status: DC | PRN

## 2013-07-30 NOTE — Progress Notes (Signed)
Date:  07/30/2013   ID:  [name], DOB 11-20-1971, MRN [id number]  PCP:  Nyoka Cowden, MD  Primary Cardiologist:  [facility]     History of Present Illness: [name] is a 42 y.o. male is morbidly obese with a BMI of over 65, and has a history of hypertension abdominal aortic aneurysm which was repaired in October 2013, DVT of the right superficial vein, chronic shortness of breath with New York Heart Association functional class II.  Patient presented today at the request of his wife due to left-sided chest pain with radiation to his back.  He states the pacer yesterday. Is worse with movement and feels a pulling sensation. If he rotates his head to left or right is aggravates the pain in the around the shoulder blade left side appeared.  Also patient has noticed an increase in weight of greater than 30 pounds in the last 30 days. Also reports lower extremity edema and dietary indiscretion since he had been taking care of his father. He and his wife been eating out a lot more.  He otherwise denies nausea, vomiting, fever, orthopnea, dizziness, PND, cough, congestion, abdominal pain, hematochezia, melena.  Wt Readings from Last 3 Encounters:  07/30/13 375 lb (170.099 kg)  07/30/13 375 lb 11.2 oz (170.416 kg)  05/27/13 348 lb (157.852 kg)     Past Medical History  Diagnosis Date  . Chronic kidney disease   . History of pulmonary embolus (PE) 2010  . 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 Left ventricular hypertrophy   . Abdominal aortic aneurysm   . Stroke 2009    pt says no stroke  . Hypertension     dr [name]  . Anxiety   . DVT (deep venous thrombosis)   . Obesity   . Chronic renal disease     advanced  . Aortic regurgitation 12/10/08    Echo: mild to mod. AOV regurg,mild AO root dilatation, LA mildly dilated, EF 65%, LV wall thickness markedly increased, small PE    Current Outpatient Prescriptions  Medication  Sig Dispense Refill  . aspirin EC 81 MG tablet Take 162 mg by mouth daily.      [name] atorvastatin (LIPITOR) 10 MG tablet Take 1 tablet (10 mg total) by mouth daily.  90 tablet  3  . carvedilol (COREG) 25 MG tablet Take 2 tablets twice daily.  120 tablet  1  . furosemide (LASIX) 20 MG tablet Take 2 tablets (40 mg total) by mouth as needed for edema.  30 tablet  5  . Olmesartan-Amlodipine-HCTZ (TRIBENZOR) 40-10-25 MG TABS Take 1 tablet by mouth daily.  90 tablet  6  . paricalcitol (ZEMPLAR) 1 MCG capsule Take 1 mcg by mouth daily. Takes Mon, [place] and Friday.       No current facility-administered medications for this visit.    Allergies:   No Known Allergies  Social History:  The patient  reports that he has never smoked. He has never used smokeless tobacco. He reports that he does not drink alcohol or use illicit drugs.   Family history:   Family History  Problem Relation Age of Onset  . Hypertension Mother   . Hypertension Father     ROS:  Please see the history of present illness.  All other systems reviewed and negative.   PHYSICAL EXAM: VS:  BP 120/80  Pulse 77  Ht 5\' 11"  (1.803 m)  Wt 375 lb (170.099 kg)  BMI 52.33 kg/m2 Obese, well developed, in no acute distress HEENT: Pupils are equal round react to light accommodation extraocular movements are intact.  Neck: no JVDNo cervical lymphadenopathy. Cardiac: Regular rate and rhythm without murmurs rubs or gallops. Lungs:  clear to auscultation bilaterally, no wheezing, rhonchi or rales Abd: soft, nontender, positive bowel sounds all quadrants Ext: 2+ lower extremity edema.  2+ radial and dorsalis pedis pulses. Skin: warm and dry Neuro:  Grossly normal  EKG shows a normal sinus rhythm does have T-wave inversions in 1 and aVL which were present on prior EKGs. Likely a repolarization abnormality  ASSESSMENT AND PLAN:  Problem List Items Addressed This Visit   Obesity, morbid, BMI 50 or higher     The patient will be referred  for medical nutrition therapy.    Lower extremity edema - Primary     Patient reports lower extremity edema and increasing weight greater than 30 pounds in the last month and a half.  He does admit to dietary indiscretion during that time and he quite a bit more since he had take care of his father for approximately one week. Prior to that he he and his wife been doing pretty good job of watching her diet however, they have essentially regressed to be whenever the like.  I do not think all of his weight gain is from calories. He is clearly retaining fluid and see if his legs.  His last 2-D echocardiogram was in 2900 and function. Repeat the echocardiogram now. Also increase his Lasix to 40 mg daily. Lasix and keep his sodium intake to less than 2000 mg daily to monitor his weight.    Relevant Orders      2D Echocardiogram without contrast      Basic Metabolic Panel (BMET)   Chest pain, atypical     Patient's chest pain starts in the upper left breast axilla and radiates around towards his left shoulder blade. It is reproducible with rotation of his head. It is clearly musculoskeletal urgent I recommended some ibuprofen          Also check a basic metabolic panel in 2 weeks to check his kidney function.

## 2013-07-30 NOTE — Assessment & Plan Note (Signed)
Patient reports lower extremity edema and increasing weight greater than 30 pounds in the last month and a half.  He does admit to dietary indiscretion during that time and he quite a bit more since he had take care of his father for approximately one week. Prior to that he he and his wife been doing pretty good job of watching her diet however, they have essentially regressed to be whenever the like.  I do not think all of his weight gain is from calories. He is clearly retaining fluid and see if his legs.  His last 2-D echocardiogram was in 2900 and function. Repeat the echocardiogram now. Also increase his Lasix to 40 mg daily. Lasix and keep his sodium intake to less than 2000 mg daily to monitor his weight.

## 2013-07-30 NOTE — Patient Instructions (Signed)
Limit your sodium (Salt) intake  Please check your blood pressure on a regular basis.  If it is consistently greater than 150/90, please make an office appointment.  Call or return to clinic prn if these symptoms worsen or fail to improve as anticipated.  

## 2013-07-30 NOTE — Assessment & Plan Note (Signed)
The patient will be referred for medical nutrition therapy.

## 2013-07-30 NOTE — Progress Notes (Signed)
Subjective:    Patient ID: [name], male    DOB: November 11, 1971, 42 y.o.   MRN: [id number]  HPI  42 year old patient who has treated hypertension and chronic kidney disease. Yesterday he made a sudden turn to his left and has had some left-sided interscapular upper back pain. Pain is aggravated by deep inspiration and movement. Denies any shortness of breath.  Past Medical History  Diagnosis Date  . Chronic kidney disease   . History of pulmonary embolus (PE) 2010  . 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 Left ventricular hypertrophy   . Abdominal aortic aneurysm   . Stroke 2009    pt says no stroke  . Hypertension     dr [name]  . Anxiety   . DVT (deep venous thrombosis)   . Obesity   . Chronic renal disease     advanced  . Aortic regurgitation 12/10/08    Echo: mild to mod. AOV regurg,mild AO root dilatation, LA mildly dilated, EF 65%, LV wall thickness markedly increased, small PE    History   Social History  . Marital Status: Married    Spouse Name: N/A    Number of Children: N/A  . Years of Education: N/A   Occupational History  . Not on file.   Social History Main Topics  . Smoking status: Former Smoker -- 3 years    Types: Cigarettes    Quit date: 01/25/1991  . Smokeless tobacco: Never Used  . Alcohol Use: No  . Drug Use: No  . Sexually Active: Not Currently -- Male partner(s)   Other Topics Concern  . Not on file   Social History Narrative  . No narrative on file    Past Surgical History  Procedure Laterality Date  . Cardiac catheterization  02/11/10    false + Nuc  . Corneal transplant    . Intravascular ultrasound  09/25/2012    Procedure: INTRAVASCULAR ULTRASOUND;  Surgeon: Mal [name], MD;  Location: [place];  Service: Vascular;  Laterality: N/A;  . Abdominal aortic aneurysm repair  09/25/2012    EVAR    Family History  Problem Relation Age of Onset  . Hypertension Mother   . Hypertension Father      No Known Allergies  Current Outpatient Prescriptions on File Prior to Visit  Medication Sig Dispense Refill  . aspirin EC 81 MG tablet Take 162 mg by mouth daily.      [name] atorvastatin (LIPITOR) 10 MG tablet Take 1 tablet (10 mg total) by mouth daily.  90 tablet  3  . carvedilol (COREG) 25 MG tablet Take 2 tablets twice daily.  120 tablet  1  . Olmesartan-Amlodipine-HCTZ (TRIBENZOR) 40-10-25 MG TABS Take 1 tablet by mouth daily.  90 tablet  6  . paricalcitol (ZEMPLAR) 1 MCG capsule Take 1 mcg by mouth daily. Takes Mon, [place] and Friday.       No current facility-administered medications on file prior to visit.    BP 130/90  Pulse 78  Temp(Src) 98.3 F (36.8 C) (Oral)  Resp 20  Wt 375 lb 11.2 oz (170.416 kg)  BMI 52.42 kg/m2  SpO2 97%       Review of Systems  Constitutional: Negative for fever, chills, appetite change and fatigue.  HENT: Negative for hearing loss, ear pain, congestion, sore throat, trouble swallowing, neck stiffness, dental problem, voice change and tinnitus.   Eyes: Negative for pain, discharge and visual disturbance.  Respiratory: Negative  for cough, chest tightness, wheezing and stridor.   Cardiovascular: Positive for leg swelling. Negative for chest pain and palpitations.  Gastrointestinal: Negative for nausea, vomiting, abdominal pain, diarrhea, constipation, blood in stool and abdominal distention.  Genitourinary: Negative for urgency, hematuria, flank pain, discharge, difficulty urinating and genital sores.  Musculoskeletal: Positive for back pain. Negative for myalgias, joint swelling, arthralgias and gait problem.  Skin: Negative for rash.  Neurological: Negative for dizziness, syncope, speech difficulty, weakness, numbness and headaches.  Hematological: Negative for adenopathy. Does not bruise/bleed easily.  Psychiatric/Behavioral: Negative for behavioral problems and dysphoric mood. The patient is not nervous/anxious.        Objective:    Physical Exam  Constitutional:  Repeat blood pressure 120/82  Musculoskeletal: He exhibits edema.  Slight tenderness involving the musculature medial to the left scapula          Assessment & Plan:   Back pain. Suspect musculoligamentous. Will treat with gentle stretching heat therapy and rest Hypertension stable. Samples provided Chronic kidney disease. Followup nephrology

## 2013-07-30 NOTE — Assessment & Plan Note (Signed)
Patient's chest pain starts in the upper left breast axilla and radiates around towards his left shoulder blade. It is reproducible with rotation of his head. It is clearly musculoskeletal urgent I recommended some ibuprofen

## 2013-07-30 NOTE — Patient Instructions (Signed)
Decrease sodium intake to less than 2000 mg daily. Start taking Lasix 40 mg daily. Start weighing himself on a daily basis preferably in the morning after you get up.  Make sure that your weight steadily declining with swelling in her legs is decreasing. I will refer you for medical nutrition therapy. Also schedule you for a lab draw to check her kidney function in 2 weeks as well as have a 2-D echocardiogram. Followup with Dr. Loletha Grayer. in 3 months.

## 2013-08-01 ENCOUNTER — Encounter: Payer: Self-pay | Admitting: Cardiovascular Disease

## 2013-08-05 ENCOUNTER — Telehealth (HOSPITAL_COMMUNITY): Payer: Self-pay | Admitting: Physician Assistant

## 2013-08-05 NOTE — Telephone Encounter (Signed)
Left message for patient to call back and schedule testing ordered by [facility]

## 2013-08-12 ENCOUNTER — Ambulatory Visit (HOSPITAL_COMMUNITY)
Admission: RE | Admit: 2013-08-12 | Discharge: 2013-08-12 | Disposition: A | Discharge status: Home / Self Care | Payer: Managed Care, Other (non HMO) | Source: Ambulatory Visit | Attending: Physician Assistant | Admitting: Physician Assistant

## 2013-08-12 DIAGNOSIS — I509 Heart failure, unspecified: Secondary | ICD-10-CM | POA: Insufficient documentation

## 2013-08-12 DIAGNOSIS — I079 Rheumatic tricuspid valve disease, unspecified: Secondary | ICD-10-CM | POA: Insufficient documentation

## 2013-08-12 DIAGNOSIS — R609 Edema, unspecified: Secondary | ICD-10-CM

## 2013-08-12 DIAGNOSIS — R600 Localized edema: Secondary | ICD-10-CM

## 2013-08-12 DIAGNOSIS — I10 Essential (primary) hypertension: Secondary | ICD-10-CM | POA: Insufficient documentation

## 2013-08-12 DIAGNOSIS — I080 Rheumatic disorders of both mitral and aortic valves: Secondary | ICD-10-CM | POA: Insufficient documentation

## 2013-08-12 NOTE — Progress Notes (Signed)
[facility]   2D echo completed 08/12/2013.   Jamison Neighbor, RDCS

## 2013-08-14 ENCOUNTER — Telehealth: Payer: Self-pay | Admitting: Cardiovascular Disease

## 2013-08-14 DIAGNOSIS — R600 Localized edema: Secondary | ICD-10-CM

## 2013-08-14 LAB — BASIC METABOLIC PANEL
BUN: 32 mg/dL — ABNORMAL HIGH (ref 6–23)
CO2: 25 mEq/L (ref 19–32)
Chloride: 101 mEq/L (ref 96–112)
Creat: 2.47 mg/dL — ABNORMAL HIGH (ref 0.50–1.35)
Glucose, Bld: 98 mg/dL (ref 70–99)

## 2013-08-14 NOTE — Telephone Encounter (Signed)
Lab order released. 

## 2013-08-30 ENCOUNTER — Encounter (HOSPITAL_BASED_OUTPATIENT_CLINIC_OR_DEPARTMENT_OTHER): Payer: Self-pay

## 2013-08-30 ENCOUNTER — Observation Stay (HOSPITAL_BASED_OUTPATIENT_CLINIC_OR_DEPARTMENT_OTHER)
Admission: EM | Admit: 2013-08-30 | Discharge: 2013-09-01 | Disposition: A | Discharge status: Home / Self Care | Payer: Managed Care, Other (non HMO) | Attending: Internal Medicine | Admitting: Internal Medicine

## 2013-08-30 ENCOUNTER — Emergency Department (HOSPITAL_BASED_OUTPATIENT_CLINIC_OR_DEPARTMENT_OTHER): Payer: Managed Care, Other (non HMO)

## 2013-08-30 DIAGNOSIS — I517 Cardiomegaly: Secondary | ICD-10-CM | POA: Insufficient documentation

## 2013-08-30 DIAGNOSIS — R0602 Shortness of breath: Secondary | ICD-10-CM

## 2013-08-30 DIAGNOSIS — I8290 Acute embolism and thrombosis of unspecified vein: Secondary | ICD-10-CM | POA: Diagnosis present

## 2013-08-30 DIAGNOSIS — I10 Essential (primary) hypertension: Secondary | ICD-10-CM

## 2013-08-30 DIAGNOSIS — R0789 Other chest pain: Principal | ICD-10-CM | POA: Insufficient documentation

## 2013-08-30 DIAGNOSIS — I749 Embolism and thrombosis of unspecified artery: Secondary | ICD-10-CM

## 2013-08-30 DIAGNOSIS — R791 Abnormal coagulation profile: Secondary | ICD-10-CM | POA: Insufficient documentation

## 2013-08-30 DIAGNOSIS — F411 Generalized anxiety disorder: Secondary | ICD-10-CM | POA: Insufficient documentation

## 2013-08-30 DIAGNOSIS — R0781 Pleurodynia: Secondary | ICD-10-CM

## 2013-08-30 DIAGNOSIS — Z86711 Personal history of pulmonary embolism: Secondary | ICD-10-CM | POA: Insufficient documentation

## 2013-08-30 DIAGNOSIS — Z6841 Body Mass Index (BMI) 40.0 and over, adult: Secondary | ICD-10-CM | POA: Insufficient documentation

## 2013-08-30 DIAGNOSIS — I714 Abdominal aortic aneurysm, without rupture, unspecified: Secondary | ICD-10-CM

## 2013-08-30 DIAGNOSIS — I2699 Other pulmonary embolism without acute cor pulmonale: Secondary | ICD-10-CM

## 2013-08-30 DIAGNOSIS — I824Z9 Acute embolism and thrombosis of unspecified deep veins of unspecified distal lower extremity: Secondary | ICD-10-CM | POA: Insufficient documentation

## 2013-08-30 DIAGNOSIS — N189 Chronic kidney disease, unspecified: Secondary | ICD-10-CM | POA: Insufficient documentation

## 2013-08-30 DIAGNOSIS — I129 Hypertensive chronic kidney disease with stage 1 through stage 4 chronic kidney disease, or unspecified chronic kidney disease: Secondary | ICD-10-CM | POA: Insufficient documentation

## 2013-08-30 DIAGNOSIS — N184 Chronic kidney disease, stage 4 (severe): Secondary | ICD-10-CM | POA: Diagnosis present

## 2013-08-30 DIAGNOSIS — R071 Chest pain on breathing: Secondary | ICD-10-CM

## 2013-08-30 LAB — URINALYSIS, ROUTINE W REFLEX MICROSCOPIC
Bilirubin Urine: NEGATIVE
Ketones, ur: NEGATIVE mg/dL
Nitrite: NEGATIVE
Urobilinogen, UA: 0.2 mg/dL (ref 0.0–1.0)
pH: 5.5 (ref 5.0–8.0)

## 2013-08-30 LAB — CBC WITH DIFFERENTIAL/PLATELET
Basophils Relative: 0 % (ref 0–1)
Eosinophils Absolute: 0.2 10*3/uL (ref 0.0–0.7)
HCT: 38.3 % — ABNORMAL LOW (ref 39.0–52.0)
Hemoglobin: 12.5 g/dL — ABNORMAL LOW (ref 13.0–17.0)
MCH: 26.8 pg (ref 26.0–34.0)
MCHC: 32.6 g/dL (ref 30.0–36.0)
MCV: 82.2 fL (ref 78.0–100.0)
Monocytes Absolute: 0.8 10*3/uL (ref 0.1–1.0)
Monocytes Relative: 14 % — ABNORMAL HIGH (ref 3–12)

## 2013-08-30 LAB — BASIC METABOLIC PANEL
BUN: 45 mg/dL — ABNORMAL HIGH (ref 6–23)
Creatinine, Ser: 2.7 mg/dL — ABNORMAL HIGH (ref 0.50–1.35)
GFR calc Af Amer: 31 mL/min — ABNORMAL LOW (ref >90)
GFR calc non Af Amer: 27 mL/min — ABNORMAL LOW (ref >90)

## 2013-08-30 LAB — POCT I-STAT TROPONIN I

## 2013-08-30 MED ORDER — SODIUM CHLORIDE 0.9 % IJ SOLN: 3.0000 mL | INTRAMUSCULAR | Status: DC | PRN

## 2013-08-30 MED ORDER — ENOXAPARIN SODIUM 150 MG/ML ~~LOC~~ SOLN
165.0000 mg | Freq: Two times a day (BID) | SUBCUTANEOUS | Status: DC
  Administered 2013-08-30: 165 mg via SUBCUTANEOUS
  Filled 2013-08-30 (×2): qty 2

## 2013-08-30 MED ORDER — ATORVASTATIN CALCIUM 10 MG PO TABS
10.0000 mg | ORAL_TABLET | Freq: Every day | ORAL | Status: DC
  Administered 2013-08-31 – 2013-09-01 (×2): 10 mg via ORAL
  Filled 2013-08-30 (×2): qty 1

## 2013-08-30 MED ORDER — HYDROCODONE-ACETAMINOPHEN 5-325 MG PO TABS: 1.0000 | ORAL_TABLET | ORAL | Status: DC | PRN

## 2013-08-30 MED ORDER — FUROSEMIDE 40 MG PO TABS
40.0000 mg | ORAL_TABLET | ORAL | Status: DC | PRN
  Filled 2013-08-30: qty 1

## 2013-08-30 MED ORDER — ENOXAPARIN SODIUM 150 MG/ML ~~LOC~~ SOLN
165.0000 mg | Freq: Two times a day (BID) | SUBCUTANEOUS | Status: DC
  Administered 2013-08-31: 165 mg via SUBCUTANEOUS
  Filled 2013-08-30 (×2): qty 2

## 2013-08-30 MED ORDER — SODIUM CHLORIDE 0.9 % IJ SOLN
3.0000 mL | Freq: Two times a day (BID) | INTRAMUSCULAR | Status: DC
  Administered 2013-08-31: 3 mL via INTRAVENOUS

## 2013-08-30 MED ORDER — ASPIRIN EC 81 MG PO TBEC
162.0000 mg | DELAYED_RELEASE_TABLET | Freq: Every day | ORAL | Status: DC
  Administered 2013-08-31 – 2013-09-01 (×2): 162 mg via ORAL
  Filled 2013-08-30 (×2): qty 2

## 2013-08-30 MED ORDER — SODIUM CHLORIDE 0.9 % IV SOLN: 250.0000 mL | INTRAVENOUS | Status: DC | PRN

## 2013-08-30 MED ORDER — CALCITRIOL 0.25 MCG PO CAPS
0.2500 ug | ORAL_CAPSULE | ORAL | Status: DC
  Administered 2013-09-01: 0.25 ug via ORAL
  Filled 2013-08-30: qty 1

## 2013-08-30 NOTE — H&P (Signed)
PCP:   Nyoka Cowden, MD   Chief Complaint:  cp  HPI: 42 yo male h/o ckd, obesity, repaired abdominal aneurysm over a year ago, h/o PE x 2 and dvt, comes in with one day of right sided pleuritic cp posteriorly.  Denies any le swelling or edema.  No cough or sob.  No fevers.  No hemoptysis.  Was on xaralto over a year ago but this was stopped one month before his surgical repair of his aneurysm and has never been restarted.  Pt denies that he has been told he needs to be on lifelong anticoagulation but his wife says he has been told this.  [facility] and transferred to ED here for vq scan as cannot get cta due to ckd.  Over wt limit for vq scan also.  No bleeding issues.  Denies calf pain bilaterally.  Review of Systems:  Positive and negative as per HPI otherwise all other systems are negative  Past Medical History: Past Medical History  Diagnosis Date  . Chronic kidney disease   . History of pulmonary embolus (PE) 2010  . 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 Left ventricular hypertrophy   . Abdominal aortic aneurysm   . Stroke 2009    pt says no stroke  . Hypertension     dr [name]  . Anxiety   . DVT (deep venous thrombosis)   . Obesity   . Chronic renal disease     advanced  . Aortic regurgitation 12/10/08    Echo: mild to mod. AOV regurg,mild AO root dilatation, LA mildly dilated, EF 65%, LV wall thickness markedly increased, small PE   Past Surgical History  Procedure Laterality Date  . Cardiac catheterization  02/11/10    false + Nuc  . Corneal transplant    . Intravascular ultrasound  09/25/2012    Procedure: INTRAVASCULAR ULTRASOUND;  Surgeon: Mal [name], MD;  Location: [place];  Service: Vascular;  Laterality: N/A;  . Abdominal aortic aneurysm repair  09/25/2012    EVAR    Medications: Prior to Admission medications   Medication Sig Start Date End Date Taking? Authorizing Provider  aspirin EC 81 MG tablet Take  162 mg by mouth daily.   Yes Historical Provider, MD  atorvastatin (LIPITOR) 10 MG tablet Take 1 tablet (10 mg total) by mouth daily. 11/29/12  Yes Marletta Lor, MD  calcitRIOL (ROCALTROL) 0.25 MCG capsule Take 0.25 mcg by mouth every Monday, Wednesday, and Friday.   Yes Historical Provider, MD  carvedilol (COREG) 25 MG tablet Take 2 tablets twice daily. 07/01/13  Yes Marletta Lor, MD  furosemide (LASIX) 20 MG tablet Take 2 tablets (40 mg total) by mouth as needed for edema. 07/30/13  Yes Tarri Fuller, PA-C  Olmesartan-Amlodipine-HCTZ (TRIBENZOR) 40-10-25 MG TABS Take 1 tablet by mouth daily. 11/29/12  Yes Marletta Lor, MD    Allergies:  No Known Allergies  Social History:  reports that he has never smoked. He has never used smokeless tobacco. He reports that he does not drink alcohol or use illicit drugs.  Family History: Family History  Problem Relation Age of Onset  . Hypertension Mother   . Hypertension Father     Physical Exam: Filed Vitals:   08/30/13 1849 08/30/13 1900 08/30/13 1945 08/30/13 2040  BP:  105/64 101/66 115/65  Pulse: 74 72 72 82  Temp:      TempSrc:      Resp: 13 17  20 14  Height:      Weight:      SpO2: 98% 95% 98% 96%   General appearance: alert, cooperative and no distress Head: Normocephalic, without obvious abnormality, atraumatic Eyes: negative Nose: Nares normal. Septum midline. Mucosa normal. No drainage or sinus tenderness. Neck: no JVD and supple, symmetrical, trachea midline Lungs: clear to auscultation bilaterally Heart: regular rate and rhythm, S1, S2 normal, no murmur, click, rub or gallop Abdomen: soft, non-tender; bowel sounds normal; no masses,  no organomegaly Extremities: extremities normal, atraumatic, no cyanosis or edema Pulses: 2+ and symmetric Skin: Skin color, texture, turgor normal. No rashes or lesions Neurologic: Grossly normal    Labs on Admission:   Recent Labs  08/30/13 1425  NA 138  K 3.4*  CL 97   CO2 29  GLUCOSE 110*  BUN 45*  CREATININE 2.70*  CALCIUM 9.8    Recent Labs  08/30/13 1425  WBC 5.6  NEUTROABS 3.5  HGB 12.5*  HCT 38.3*  MCV 82.2  PLT 155    Radiological Exams on Admission: Dg Chest 2 View  08/30/2013   *RADIOLOGY REPORT*  Clinical Data: Right-sided chest pain, increased and during deep inspiration.  CHEST - 2 VIEW  Comparison: Chest x-ray 09/25/2012.  Findings: New linear opacity in the left lateral costophrenic sulcus may represent atelectasis or scarring.  No acute consolidative airspace disease.  No pleural effusions.  No evidence of pulmonary edema.  Heart size is normal.  Upper mediastinal contours are within normal limits.  IMPRESSION: 1.  Small linear opacity in the left lateral costophrenic sulcus favored to represent subsegmental atelectasis or scarring.  No findings to account for the reported right-sided chest pain.   Original Report Authenticated By: Vinnie Langton, M.D.    Assessment/Plan  42 yo male with pleuritic chest pain and h/o at least 2 prior VTE events in past currently not anticoagulated with ckd and obesity Principal Problem:   Chest pain, atypical Active Problems:   Obesity, morbid, BMI 50 or higher   RENAL DISEASE, CHRONIC   h/o VTE (venous thromboembolism)   SOB (shortness of breath)  Should be on life time anticoagulation regardless of studies as long as there is no contraindication.  Full dose lovenox.  Wants to be on xaralto if cost is not an issue, will need SW consult.  Troponin level is pending.  Once resulted if neg place on tele.  Full code.    DAVID,RACHAL A 08/30/2013, 8:58 PM

## 2013-08-30 NOTE — ED Notes (Signed)
Jacubowitz MD at bedside.

## 2013-08-30 NOTE — Consult Note (Signed)
ANTICOAGULATION CONSULT NOTE - Initial Consult  Pharmacy Consult for Lovenox Indication: r/o PE  No Known Allergies  Patient Measurements: Height: 5' 10.87" (180 cm) Weight: 365 lb (165.563 kg) IBW/kg (Calculated) : 74.99  Vital Signs: Temp: 99.1 F (37.3 C) (09/06 1357) Temp src: Oral (09/06 1357) BP: 128/81 mmHg (09/06 1605) Pulse Rate: 68 (09/06 1605)  Labs:  Recent Labs  08/30/13 1425  HGB 12.5*  HCT 38.3*  PLT 155  CREATININE 2.70*    Estimated Creatinine Clearance: 54.9 ml/min (by C-G formula based on Cr of 2.7).   Medical History: Past Medical History  Diagnosis Date  . Chronic kidney disease   . History of pulmonary embolus (PE) 2010  . 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 Left ventricular hypertrophy   . Abdominal aortic aneurysm   . Stroke 2009    pt says no stroke  . Hypertension     dr [name]  . Anxiety   . DVT (deep venous thrombosis)   . Obesity   . Chronic renal disease     advanced  . Aortic regurgitation 12/10/08    Echo: mild to mod. AOV regurg,mild AO root dilatation, LA mildly dilated, EF 65%, LV wall thickness markedly increased, small PE   Assessment: 42yom with CP and elevated d-dimer to begin full dose lovenox for possible PE. Unable to do CTA at [facility] so patient being transferred to [facility] for VQ scan. Patient has renal insufficiency but CrCl 85ml/min so qualifies for 1mg /kg q12 dosing. Weight = 165mg . Baseline CBC ok.  Goal of Therapy:  Anti-Xa level 0.6-1.2 units/ml 4hrs after LMWH dose given Monitor platelets by anticoagulation protocol: Yes   Plan:  1) Lovenox 165mg  sq q12 2) Check anti-Xa level at steady state given patient size 3) CBC q72h  Deboraha Sprang 08/30/2013,4:08 PM

## 2013-08-30 NOTE — ED Notes (Signed)
MD at bedside. 

## 2013-08-30 NOTE — ED Provider Notes (Signed)
Patient sent here [facility], where he had evaluated this morning for right sided posterolateral chest pain, pleuritic in quality. Patient had elevated d-dimer and history of pulmonary embolism. Pain worse with lying on right side or changing position, pleuritic in quality. He thinks it may feel a pulmonary embolus in the past. Currently pain is 0 on a scale of 1-10. He is presently asymptomatic. On exam no distress lungs clear auscultation heart regular rate and rhythm abdomen morbidly obese nontender extremities no edema. At 8:50 PM patient remains asymptomatic. Medical decision making :  patient's weight is too high for ventilation/perfusion scan. He cannot have CT angiogram in light of renal insufficiency. I spoke with Dr. Shanon Brow. Plan 23 hour observation telemetry. He will have noninvasive studies of legs the morning to rule out DVT. If negative. He is likely stable for discharge. Pulmonary embolism is unlikely in light of the fact that the patient is presently asymptomatic  Orlie Dakin, MD 08/30/13 2056

## 2013-08-30 NOTE — ED Notes (Addendum)
Patient here with right side pain x 36 hours. Denies trauma, denies increased pain with movement. Nausea yesterday, no vomiting. Patient reports that earlier today pain worse with inspiration

## 2013-08-30 NOTE — ED Provider Notes (Signed)
CSN: [id number]     Arrival date & time 08/30/13  1351 History   First MD Initiated Contact with Patient 08/30/13 1410     Chief Complaint  Patient presents with  . right side pain    (Consider location/radiation/quality/duration/timing/severity/associated sxs/prior Treatment) HPI Pt with multiple medical problems reports about 24hrs of moderate aching pain in R lower ribs, worse with deep breath and movement. No known injury but he was doing some heavy lifting yesterday. Denies any CP or SOB. No fever cough, N/V/D, no change with eating. No dysuria or hematuria. He has remote history of DVT/PE no longer on blood thinners. Has been active recently without exertional symptoms. No recent travel.   Past Medical History  Diagnosis Date  . Chronic kidney disease   . History of pulmonary embolus (PE) 2010  . 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 Left ventricular hypertrophy   . Abdominal aortic aneurysm   . Stroke 2009    pt says no stroke  . Hypertension     dr [name]  . Anxiety   . DVT (deep venous thrombosis)   . Obesity   . Chronic renal disease     advanced  . Aortic regurgitation 12/10/08    Echo: mild to mod. AOV regurg,mild AO root dilatation, LA mildly dilated, EF 65%, LV wall thickness markedly increased, small PE   Past Surgical History  Procedure Laterality Date  . Cardiac catheterization  02/11/10    false + Nuc  . Corneal transplant    . Intravascular ultrasound  09/25/2012    Procedure: INTRAVASCULAR ULTRASOUND;  Surgeon: Mal [name], MD;  Location: [place];  Service: Vascular;  Laterality: N/A;  . Abdominal aortic aneurysm repair  09/25/2012    EVAR   Family History  Problem Relation Age of Onset  . Hypertension Mother   . Hypertension Father    History  Substance Use Topics  . Smoking status: Never Smoker   . Smokeless tobacco: Never Used  . Alcohol Use: No    Review of Systems All other systems reviewed and are negative  except as noted in HPI.   Allergies  Review of patient's allergies indicates no known allergies.  Home Medications   Current Outpatient Rx  Name  Route  Sig  Dispense  Refill  . aspirin EC 81 MG tablet   Oral   Take 162 mg by mouth daily.         [name] atorvastatin (LIPITOR) 10 MG tablet   Oral   Take 1 tablet (10 mg total) by mouth daily.   90 tablet   3   . carvedilol (COREG) 25 MG tablet      Take 2 tablets twice daily.   120 tablet   1   . furosemide (LASIX) 20 MG tablet   Oral   Take 2 tablets (40 mg total) by mouth as needed for edema.   30 tablet   5   . Olmesartan-Amlodipine-HCTZ (TRIBENZOR) 40-10-25 MG TABS   Oral   Take 1 tablet by mouth daily.   90 tablet   6   . paricalcitol (ZEMPLAR) 1 MCG capsule   Oral   Take 1 mcg by mouth daily. Takes Mon, [place] and Friday.          BP 124/73  Pulse 71  Temp(Src) 99.1 F (37.3 C) (Oral)  Resp 18  Wt 365 lb (165.563 kg)  BMI 50.93 kg/m2  SpO2 97% Physical Exam  Nursing note and vitals reviewed. Constitutional: He is oriented to person, place, and time. He appears well-developed and well-nourished.  HENT:  Head: Normocephalic and atraumatic.  Eyes: EOM are normal. Pupils are equal, round, and reactive to light.  Neck: Normal range of motion. Neck supple.  Cardiovascular: Normal rate, normal heart sounds and intact distal pulses.   Pulmonary/Chest: Effort normal and breath sounds normal. No respiratory distress. He has no wheezes. He has no rales. He exhibits tenderness (R lower ribs in midaxillary line).  Abdominal: Bowel sounds are normal. He exhibits no distension. There is no tenderness.  Musculoskeletal: Normal range of motion. He exhibits no edema and no tenderness.  Neurological: He is alert and oriented to person, place, and time. He has normal strength. No cranial nerve deficit or sensory deficit.  Skin: Skin is warm and dry. No rash noted.  Psychiatric: He has a normal mood and affect.    ED  Course  Procedures (including critical care time) Labs Review Labs Reviewed  CBC WITH DIFFERENTIAL - Abnormal; Notable for the following:    Hemoglobin 12.5 (*)    HCT 38.3 (*)    Monocytes Relative 14 (*)    All other components within normal limits  BASIC METABOLIC PANEL - Abnormal; Notable for the following:    Potassium 3.4 (*)    Glucose, Bld 110 (*)    BUN 45 (*)    Creatinine, Ser 2.70 (*)    GFR calc non Af Amer 27 (*)    GFR calc Af Amer 31 (*)    All other components within normal limits  D-DIMER, QUANTITATIVE - Abnormal; Notable for the following:    D-Dimer, Quant >20.00 (*)    All other components within normal limits  URINALYSIS, ROUTINE W REFLEX MICROSCOPIC   Imaging Review Dg Chest 2 View  08/30/2013   *RADIOLOGY REPORT*  Clinical Data: Right-sided chest pain, increased and during deep inspiration.  CHEST - 2 VIEW  Comparison: Chest x-ray 09/25/2012.  Findings: New linear opacity in the left lateral costophrenic sulcus may represent atelectasis or scarring.  No acute consolidative airspace disease.  No pleural effusions.  No evidence of pulmonary edema.  Heart size is normal.  Upper mediastinal contours are within normal limits.  IMPRESSION: 1.  Small linear opacity in the left lateral costophrenic sulcus favored to represent subsegmental atelectasis or scarring.  No findings to account for the reported right-sided chest pain.   Original Report Authenticated By: Vinnie Langton, M.D.    MDM   1. Pleuritic chest pain     Pt with markedly elevated dimer, history of CKD unable to obtain CTA here. Discussed with radiology, V/Q is available, will transport to [facility] ED for further eval. Discussed with Dr. Winfred Leeds who will accept. [facility] informed.    Date: 08/30/2013  Rate: 70  Rhythm: normal sinus rhythm  QRS Axis: normal  Intervals: PR prolonged  ST/T Wave abnormalities: nonspecific T wave changes  Conduction Disutrbances:first-degree A-V block   Narrative  Interpretation:   Old EKG Reviewed: unchanged      Charles B. Karle Starch, MD 08/30/13 [phone]

## 2013-08-31 DIAGNOSIS — R0789 Other chest pain: Principal | ICD-10-CM

## 2013-08-31 LAB — CBC
HCT: 37.2 % — ABNORMAL LOW (ref 39.0–52.0)
Hemoglobin: 12.6 g/dL — ABNORMAL LOW (ref 13.0–17.0)
MCHC: 33.9 g/dL (ref 30.0–36.0)

## 2013-08-31 LAB — BASIC METABOLIC PANEL
BUN: 43 mg/dL — ABNORMAL HIGH (ref 6–23)
CO2: 27 mEq/L (ref 19–32)
Chloride: 100 mEq/L (ref 96–112)
GFR calc Af Amer: 35 mL/min — ABNORMAL LOW (ref >90)
Potassium: 3.4 mEq/L — ABNORMAL LOW (ref 3.5–5.1)

## 2013-08-31 LAB — PROTIME-INR: INR: 1.23 (ref 0.00–1.49)

## 2013-08-31 LAB — TROPONIN I: Troponin I: <0.3 ng/mL (ref <0.30)

## 2013-08-31 MED ORDER — POTASSIUM CHLORIDE CRYS ER 20 MEQ PO TBCR
60.0000 meq | EXTENDED_RELEASE_TABLET | Freq: Once | ORAL | Status: AC
  Administered 2013-08-31: 60 meq via ORAL
  Filled 2013-08-31: qty 3

## 2013-08-31 MED ORDER — RIVAROXABAN 15 MG PO TABS
15.0000 mg | ORAL_TABLET | Freq: Two times a day (BID) | ORAL | Status: DC
  Administered 2013-08-31 – 2013-09-01 (×2): 15 mg via ORAL
  Filled 2013-08-31 (×4): qty 1

## 2013-08-31 NOTE — Discharge Summary (Signed)
Physician Discharge Summary  DRACE GATER [id number] DOB: October 03, 1971 DOA: 08/30/2013  PCP: Nyoka Cowden, MD  Admit date: 08/30/2013 Discharge date: 09/01/2013  Time spent: 40 minutes  Recommendations for Outpatient Follow-up:  1. Followup with her regular physician within one week. 2. Patient will need prescription for Xarelto 20 mg daily. 3. Check BMP/CBC in one week.  Discharge Diagnoses:  Principal Problem:   Chest pain, atypical Active Problems:   Obesity, morbid, BMI 50 or higher   RENAL DISEASE, CHRONIC   h/o VTE (venous thromboembolism)   SOB (shortness of breath)   Discharge Condition: Stable  Diet recommendation: Heart healthy  Filed Weights   08/30/13 1357 08/30/13 2329  Weight: 165.563 kg (365 lb) 166.017 kg (366 lb)    History of present illness:  42 yo male h/o ckd, obesity, repaired abdominal aneurysm over a year ago, h/o PE x 2 and dvt, comes in with one day of right sided pleuritic cp posteriorly. Denies any le swelling or edema. No cough or sob. No fevers. No hemoptysis. Was on xaralto over a year ago but this was stopped one month before his surgical repair of his aneurysm and has never been restarted. Pt denies that he has been told he needs to be on lifelong anticoagulation but his wife says he has been told this. Presented to [facility] and transferred to ED here for vq scan as cannot get cta due to ckd. Over wt limit for vq scan also. No bleeding issues. Denies calf pain bilaterally.  Hospital Course:   1. Right-sided pleuritic chest pain: Patient has a history of prior to CT episodes, one DVT and in a different occasion PE. Patient came in with D. dimers of over 20 suggesting ongoing thromboembolism. CT angio cannot be done because of patient's elevated creatinine, patient does have CKD stage III. Patient cannot get a VQ scan because of his body habitus, he does have morbid obesity. Left lower extremity Doppler showed DVT, we will treat as DVT in  the right sided pleuritic chest pain currently as PE. Patient started on Lovenox, switched to Xarelto.   2. History of DVT: As mentioned above patient has DVT and in a different occasion had a PE. Patient was taken off of Xarelto for AAA repair last year, he was not on anticoagulation since then. Came in with chest pain, imaging to the chest cannot be done so Doppler ultrasound was done and showed left lower extremity DVT. We'll treat as DVT/PE. 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  3. CK stage III: Likely secondary to hypertension, follows with nephrology, his blood pressure is controlled during this hospital stay.  4. Questionable hypercoagulable status: Was not checked before to rule out hypercoagulable state, he mentioned one of the PE is with provoked and the other one is questionable. Patient already started on anticoagulation this time, some of the hypercoagulable state of course can be done while patient on heparin on his, I did not do it this time but I think if he is going for anticoagulation for life time it will not change the outcome or management. It can be done always as outpatient.   Procedures:  Doppler ultrasound: Preliminary reading is positive for acute versus subacute DVT involving the posterior tibial vein, possibly also is limited because of body habitus.  Consultations:  None  Discharge Exam: Filed Vitals:   09/01/13 0500  BP: 137/86  Pulse: 77  Temp: 98.6 F (37 C)  Resp: 18   General: Alert and awake, oriented x3, not in any acute distress. HEENT: anicteric sclera, pupils reactive to light and accommodation, EOMI CVS: S1-S2 clear, no murmur rubs or gallops Chest: clear to auscultation bilaterally, no wheezing, rales or rhonchi Abdomen: soft nontender, nondistended, normal bowel sounds, no organomegaly Extremities: no cyanosis, clubbing or edema noted bilaterally Neuro: Cranial nerves II-XII intact,  no focal neurological deficits  Discharge Instructions      Discharge Orders   Future Appointments Provider Department Dept Phone   12/02/2013 9:30 AM [facility] [place] [facility] AT [facility] [phone]   Patient to arrive 15 minutes prior to appointment time. Patient to pick up oral contrast at least 1 day prior to exam, unless otherwise instructed by your physician. No solid food 4 hours prior to exam. Liquids and Medicines are okay.   12/02/2013 10:45 AM Mal [name], MD [facility] [phone]   Future Orders Complete By Expires   Diet - low sodium heart healthy  As directed    Increase activity slowly  As directed        Medication List         aspirin EC 81 MG tablet  Take 162 mg by mouth daily.     atorvastatin 10 MG tablet  Commonly known as:  LIPITOR  Take 1 tablet (10 mg total) by mouth daily.     calcitRIOL 0.25 MCG capsule  Commonly known as:  ROCALTROL  Take 0.25 mcg by mouth every Monday, Wednesday, and Friday.     carvedilol 25 MG tablet  Commonly known as:  COREG  Take 2 tablets twice daily.     furosemide 20 MG tablet  Commonly known as:  LASIX  Take 2 tablets (40 mg total) by mouth as needed for edema.     HYDROcodone-acetaminophen 5-325 MG per tablet  Commonly known as:  NORCO  Take 1 tablet by mouth every 6 (six) hours as needed for pain.     Olmesartan-Amlodipine-HCTZ 40-10-25 MG Tabs  Commonly known as:  TRIBENZOR  Take 1 tablet by mouth daily.     Rivaroxaban 15 MG Tabs tablet  Commonly known as:  XARELTO  Take 1 tablet (15 mg total) by mouth 2 (two) times daily with a meal.       No Known Allergies Follow-up Information   Follow up with Nyoka Cowden, MD In 1 week.   Specialty:  Internal Medicine   Contact information:   [place] [id number] [phone]        The results of significant diagnostics from this hospitalization (including imaging,  microbiology, ancillary and laboratory) are listed below for reference.    Significant Diagnostic Studies: Dg Chest 2 View  08/30/2013   *RADIOLOGY REPORT*  Clinical Data: Right-sided chest pain, increased and during deep inspiration.  CHEST - 2 VIEW  Comparison: Chest x-ray 09/25/2012.  Findings: New linear opacity in the left lateral costophrenic sulcus may represent atelectasis or scarring.  No acute consolidative airspace disease.  No pleural effusions.  No evidence of pulmonary edema.  Heart size is normal.  Upper mediastinal contours are within normal limits.  IMPRESSION: 1.  Small linear opacity in the left lateral costophrenic sulcus favored to represent subsegmental atelectasis or scarring.  No findings to account for the reported right-sided chest pain.   Original Report Authenticated By: Vinnie Langton, M.D.    Microbiology: No results found for this or any previous visit (from the  past 240 hour(s)).   Labs: Basic Metabolic Panel:  Recent Labs Lab 08/30/13 1425 08/31/13 0420 09/01/13 0455  NA 138 138 135  K 3.4* 3.4* 3.6  CL 97 100 99  CO2 29 27 26   GLUCOSE 110* 111* 112*  BUN 45* 43* 35*  CREATININE 2.70* 2.46* 2.23*  CALCIUM 9.8 9.4 9.2   Liver Function Tests: No results found for this basename: AST, ALT, ALKPHOS, BILITOT, PROT, ALBUMIN,  in the last 168 hours No results found for this basename: LIPASE, AMYLASE,  in the last 168 hours No results found for this basename: AMMONIA,  in the last 168 hours CBC:  Recent Labs Lab 08/30/13 1425 08/31/13 0420 09/01/13 0455  WBC 5.6 7.3 6.4  NEUTROABS 3.5  --   --   HGB 12.5* 12.6* 11.9*  HCT 38.3* 37.2* 35.4*  MCV 82.2 80.5 80.8  PLT 155 153 145*   Cardiac Enzymes:  Recent Labs Lab 08/31/13 0420  TROPONINI <0.30   BNP: BNP (last 3 results) No results found for this basename: PROBNP,  in the last 8760 hours CBG: No results found for this basename: GLUCAP,  in the last 168  hours     Signed:  [facility]  [facility] 09/01/2013, 8:56 AM

## 2013-08-31 NOTE — Progress Notes (Signed)
*  Preliminary Results* Bilateral lower extremity venous duplex completed. Study was technically limited due to patient body habitus. There is no obvious evidence of right lower extremity deep vein thrombosis. The left lower extremity is positive for acute, vs subacute deep vein thrombosis involving a single left posterior tibial vein. There is a left Baker's cyst.  Preliminary results discussed with Erasmo Downer, RN.  08/31/2013  Maudry Mayhew, RVT, RDCS, RDMS

## 2013-08-31 NOTE — Progress Notes (Signed)
Utilization Review completed.  

## 2013-08-31 NOTE — Progress Notes (Signed)
   CARE MANAGEMENT NOTE 08/31/2013  Patient:  [name], [name]   Account Number:  [id number]  Date Initiated:  08/31/2013  Documentation initiated by:  [facility]  Subjective/Objective Assessment:   adm: chest pain     Action/Plan:   Anticipated DC Date:  09/01/2013   Anticipated DC Plan:        [place]  CM consult      Choice offered to / List presented to:             Status of service:  Completed, signed off Medicare Important Message given?   (If response is "NO", the following Medicare IM given date fields will be blank) Date Medicare IM given:   Date Additional Medicare IM given:    Discharge Disposition:  HOME/SELF CARE  Per UR Regulation:    If discussed at Long Length of Stay Meetings, dates discussed:    Comments:  08/31/2013 17:00 CM met with pt and pt's wife in room and gave them a 30 day free trial card for Xarelto and a $5 copay card for Xarelto.  Activation was explained and pt states they will activate it by phone tomorrow.  Pt's pharmacy is [facility] and Xarelto is available at this pharmacy. No other CM needs were communicated at this time.  Mariane Masters, BSN, [place]

## 2013-08-31 NOTE — Progress Notes (Signed)
ANTICOAGULATION CONSULT NOTE - Initial Consult  Pharmacy Consult for Xarelto Indication: VTE treatment  No Known Allergies  Patient Measurements: Height: 5\' 11"  (180.3 cm) Weight: 366 lb (166.017 kg) IBW/kg (Calculated) : 75.3  Vital Signs: Temp: 99.1 F (37.3 C) (09/07 0500) BP: 128/78 mmHg (09/07 0500) Pulse Rate: 77 (09/07 0500)  Labs:  Recent Labs  08/30/13 1425 08/31/13 0420  HGB 12.5* 12.6*  HCT 38.3* 37.2*  PLT 155 153  LABPROT  --  15.2  INR  --  1.23  CREATININE 2.70* 2.46*  TROPONINI  --  <0.30    Estimated Creatinine Clearance: 60.5 ml/min (by C-G formula based on Cr of 2.46).   Medical History: Past Medical History  Diagnosis Date  . Chronic kidney disease   . History of pulmonary embolus (PE) 2010  . 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 Left ventricular hypertrophy   . Abdominal aortic aneurysm   . Stroke 2009    pt says no stroke  . Hypertension     dr [name]  . Anxiety   . DVT (deep venous thrombosis)   . Obesity   . Chronic renal disease     advanced  . Aortic regurgitation 12/10/08    Echo: mild to mod. AOV regurg,mild AO root dilatation, LA mildly dilated, EF 65%, LV wall thickness markedly increased, small PE    Medications:  Scheduled:  . aspirin EC  162 mg Oral Daily  . atorvastatin  10 mg Oral Daily  . [START ON 09/01/2013] calcitRIOL  0.25 mcg Oral Q M,W,F  . enoxaparin (LOVENOX) injection  165 mg Subcutaneous BID  . potassium chloride  60 mEq Oral Once  . sodium chloride  3 mL Intravenous Q12H  . sodium chloride  3 mL Intravenous Q12H   Assessment: 42 yo morbidly obese M with CP and elevated d-dimer who began full dose lovenox for possible PE. Scans are positive for LLE DVT, and a PE is highly suspected.  Pharmacy consulted to transition from treatment dose Lovenox to Xarelto.  Last dose of Lovenox was 165mg  SQ at ~0630 this morning. First Xarelto dose (administer with snack or meal)  to be  given at what would have been next anticipated Lovenox administration tonight. SCr has improved since admission and estimated CrCl ~60.  No issues or reports of bleeding noted.  Goal of Therapy:  Therapeutic anticoagulation Monitor platelets by anticoagulation protocol: Yes   Plan:  - discontinue Lovenox  - begin Xarelto 15mg  PO bid with food @ ~1830 tonight - remain on 15mg  bid for a total of 21 days, then change to 20mg  with evening meal - monitor for s/s of bleeding  Ovid Curd E. Jacqlyn Larsen, PharmD Clinical Pharmacist - Resident Pager: [phone] Pharmacy: [phone] 08/31/2013 12:38 PM

## 2013-08-31 NOTE — Progress Notes (Signed)
Results of LE dopplers called to me by vascular tech. Dr. Hartford Poli on floor and made aware. No new orders received at this time.

## 2013-08-31 NOTE — Progress Notes (Signed)
[facility] PROGRESS NOTE  [name] [id number] DOB: 11-17-1971 DOA: 08/30/2013 PCP: Nyoka Cowden, MD  HPI/Subjective: Still has a right-sided chest wall pain gets worse with movement.  Assessment/Plan: Principal Problem:   Chest pain, atypical Active Problems:   Obesity, morbid, BMI 50 or higher   RENAL DISEASE, CHRONIC   h/o VTE (venous thromboembolism)   SOB (shortness of breath)   Right-sided pleuritic chest pain -History of 2 prior VTE episodes ( left lower extremity DVT and PE). -CT angio cannot be done because of patient elevated creatinine. -He cannot have VQ scan either because of his body habitus. -Obtain bilateral lower extremity Dopplers, if positive anticoagulate. If negative might call PCCM for further recommendations.  CKD stage III -Was baseline creatinine around 2.4. -Patient is around his baseline, avoid contrast media.  History of DVT -As mentioned above history of DVT and in a different location as PE. -Patient was on Xarelto and was taken off of that while he was going for a AAA repair last year. -Likely the patient will benefit from long term anticoagulation.  Code Status: Full code Family Communication: Plan discussed with the patient. Disposition Plan: Remains inpatient   Consultants:  None  Procedures:  Waiting on lower extremity Dopplers  Antibiotics:  None   Objective: Filed Vitals:   08/31/13 0500  BP: 128/78  Pulse: 77  Temp: 99.1 F (37.3 C)  Resp: 20    Intake/Output Summary (Last 24 hours) at 08/31/13 1009 Last data filed at 08/31/13 0840  Gross per 24 hour  Intake    360 ml  Output      0 ml  Net    360 ml   Filed Weights   08/30/13 1357 08/30/13 2329  Weight: 165.563 kg (365 lb) 166.017 kg (366 lb)    Exam: General: Alert and awake, oriented x3, not in any acute distress. HEENT: anicteric sclera, pupils reactive to light and accommodation, EOMI CVS: S1-S2 clear, no murmur rubs  or gallops Chest: clear to auscultation bilaterally, no wheezing, rales or rhonchi Abdomen: soft nontender, nondistended, normal bowel sounds, no organomegaly Extremities: no cyanosis, clubbing or edema noted bilaterally Neuro: Cranial nerves II-XII intact, no focal neurological deficits  Data Reviewed: Basic Metabolic Panel:  Recent Labs Lab 08/30/13 1425 08/31/13 0420  NA 138 138  K 3.4* 3.4*  CL 97 100  CO2 29 27  GLUCOSE 110* 111*  BUN 45* 43*  CREATININE 2.70* 2.46*  CALCIUM 9.8 9.4   Liver Function Tests: No results found for this basename: AST, ALT, ALKPHOS, BILITOT, PROT, ALBUMIN,  in the last 168 hours No results found for this basename: LIPASE, AMYLASE,  in the last 168 hours No results found for this basename: AMMONIA,  in the last 168 hours CBC:  Recent Labs Lab 08/30/13 1425 08/31/13 0420  WBC 5.6 7.3  NEUTROABS 3.5  --   HGB 12.5* 12.6*  HCT 38.3* 37.2*  MCV 82.2 80.5  PLT 155 153   Cardiac Enzymes:  Recent Labs Lab 08/31/13 0420  TROPONINI <0.30   BNP (last 3 results) No results found for this basename: PROBNP,  in the last 8760 hours CBG: No results found for this basename: GLUCAP,  in the last 168 hours  Micro No results found for this or any previous visit (from the past 240 hour(s)).   Studies: Dg Chest 2 View  08/30/2013   *RADIOLOGY REPORT*  Clinical Data: Right-sided chest pain, increased and during deep inspiration.  CHEST - 2 VIEW  Comparison: Chest x-ray 09/25/2012.  Findings: New linear opacity in the left lateral costophrenic sulcus may represent atelectasis or scarring.  No acute consolidative airspace disease.  No pleural effusions.  No evidence of pulmonary edema.  Heart size is normal.  Upper mediastinal contours are within normal limits.  IMPRESSION: 1.  Small linear opacity in the left lateral costophrenic sulcus favored to represent subsegmental atelectasis or scarring.  No findings to account for the reported right-sided chest  pain.   Original Report Authenticated By: Vinnie Langton, M.D.    Scheduled Meds: . aspirin EC  162 mg Oral Daily  . atorvastatin  10 mg Oral Daily  . [START ON 09/01/2013] calcitRIOL  0.25 mcg Oral Q M,W,F  . enoxaparin (LOVENOX) injection  165 mg Subcutaneous BID  . sodium chloride  3 mL Intravenous Q12H  . sodium chloride  3 mL Intravenous Q12H   Continuous Infusions:      Time spent: 35 minutes    [facility]  [facility] Pager [phone] If 7PM-7AM, please contact night-coverage at [email or web address], password [facility] 08/31/2013, 10:09 AM  LOS: 1 day

## 2013-09-01 LAB — BASIC METABOLIC PANEL
BUN: 35 mg/dL — ABNORMAL HIGH (ref 6–23)
Chloride: 99 mEq/L (ref 96–112)
GFR calc Af Amer: 39 mL/min — ABNORMAL LOW (ref >90)
Potassium: 3.6 mEq/L (ref 3.5–5.1)

## 2013-09-01 LAB — CBC
HCT: 35.4 % — ABNORMAL LOW (ref 39.0–52.0)
Hemoglobin: 11.9 g/dL — ABNORMAL LOW (ref 13.0–17.0)
WBC: 6.4 10*3/uL (ref 4.0–10.5)

## 2013-09-01 MED ORDER — RIVAROXABAN 15 MG PO TABS: 15.0000 mg | ORAL_TABLET | Freq: Two times a day (BID) | ORAL | Status: DC

## 2013-09-01 MED ORDER — HYDROCODONE-ACETAMINOPHEN 5-325 MG PO TABS: 1.0000 | ORAL_TABLET | Freq: Four times a day (QID) | ORAL | Status: DC | PRN

## 2013-09-01 NOTE — Progress Notes (Signed)
09/01/13  Pharmacy- Patient education 32  Pt is a 42yo male with a blood clot in his leg and probably in his lung as well, but pt is too large for VQ scan or CT.  He will be taking Xarelto.  I called him on his cell phone [phone] and educated him on Xarelto as he was discharged from the hospital before I could catch him this AM.   I explained the importance of this  medication and signs of bleeding and the importance of contacting his MD if he saw anything that made him think his blood was too thin:  Unusual bruising, blood in urine or bm's, or if he was to fall.  Pt expressed understanding and thanked me for calling him.  Gracy Bruins, PharmD Clinical Pharmacist [facility]

## 2013-09-04 ENCOUNTER — Encounter: Payer: Self-pay | Admitting: Internal Medicine

## 2013-09-04 ENCOUNTER — Ambulatory Visit (INDEPENDENT_AMBULATORY_CARE_PROVIDER_SITE_OTHER): Payer: Managed Care, Other (non HMO) | Admitting: Internal Medicine

## 2013-09-04 VITALS — BP 120/86 | HR 71 | Temp 98.3°F | Resp 20 | Wt 370.0 lb

## 2013-09-04 DIAGNOSIS — I10 Essential (primary) hypertension: Secondary | ICD-10-CM

## 2013-09-04 DIAGNOSIS — N289 Disorder of kidney and ureter, unspecified: Secondary | ICD-10-CM

## 2013-09-04 DIAGNOSIS — I2699 Other pulmonary embolism without acute cor pulmonale: Secondary | ICD-10-CM

## 2013-09-04 MED ORDER — RIVAROXABAN 20 MG PO TABS: 20.0000 mg | ORAL_TABLET | Freq: Every day | ORAL | Status: DC

## 2013-09-04 NOTE — Progress Notes (Signed)
Subjective:    Patient ID: [name], male    DOB: June 26, 1971, 42 y.o.   MRN: [id number]  HPI   42 year old patient who is seen today post hospital discharge for right-sided pulmonary embolism. He has a prior history of DVT and pulmonary embolism but apparently chronic anticoagulation was stopped prior to AAA repair. He also remains on daily aspirin. He did have a heart catheterization in 2011 which revealed normal coronary arteries. He has chronic kidney disease and chronic lower extremity edema which has been stable. He has morbid obesity. Since his hospital discharge 3 days ago he has done quite well  Past Medical History  Diagnosis Date  . Chronic kidney disease   . History of pulmonary embolus (PE) 2010  . 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 Left ventricular hypertrophy   . Abdominal aortic aneurysm   . Stroke 2009    pt says no stroke  . Hypertension     dr [name]  . Anxiety   . DVT (deep venous thrombosis)   . Obesity   . Chronic renal disease     advanced  . Aortic regurgitation 12/10/08    Echo: mild to mod. AOV regurg,mild AO root dilatation, LA mildly dilated, EF 65%, LV wall thickness markedly increased, small PE    History   Social History  . Marital Status: Married    Spouse Name: N/A    Number of Children: N/A  . Years of Education: N/A   Occupational History  . Not on file.   Social History Main Topics  . Smoking status: Never Smoker   . Smokeless tobacco: Never Used  . Alcohol Use: No  . Drug Use: No  . Sexual Activity: Not Currently    Partners: Female   Other Topics Concern  . Not on file   Social History Narrative  . No narrative on file    Past Surgical History  Procedure Laterality Date  . Cardiac catheterization  02/11/10    false + Nuc  . Corneal transplant    . Intravascular ultrasound  09/25/2012    Procedure: INTRAVASCULAR ULTRASOUND;  Surgeon: Mal [name], MD;  Location: [place];   Service: Vascular;  Laterality: N/A;  . Abdominal aortic aneurysm repair  09/25/2012    EVAR    Family History  Problem Relation Age of Onset  . Hypertension Mother   . Hypertension Father     No Known Allergies  Current Outpatient Prescriptions on File Prior to Visit  Medication Sig Dispense Refill  . atorvastatin (LIPITOR) 10 MG tablet Take 1 tablet (10 mg total) by mouth daily.  90 tablet  3  . calcitRIOL (ROCALTROL) 0.25 MCG capsule Take 0.25 mcg by mouth every Monday, Wednesday, and Friday.      . carvedilol (COREG) 25 MG tablet Take 2 tablets twice daily.  120 tablet  1  . furosemide (LASIX) 20 MG tablet Take 2 tablets (40 mg total) by mouth as needed for edema.  30 tablet  5  . HYDROcodone-acetaminophen (NORCO) 5-325 MG per tablet Take 1 tablet by mouth every 6 (six) hours as needed for pain.  30 tablet  0  . Olmesartan-Amlodipine-HCTZ (TRIBENZOR) 40-10-25 MG TABS Take 1 tablet by mouth daily.  90 tablet  6   No current facility-administered medications on file prior to visit.    BP 120/86  Pulse 71  Temp(Src) 98.3 F (36.8 C) (Oral)  Resp 20  Wt 370 lb (167.831  kg)  BMI 51.63 kg/m2  SpO2 96%       Review of Systems  Constitutional: Negative for fever, chills, appetite change and fatigue.  HENT: Negative for hearing loss, ear pain, congestion, sore throat, trouble swallowing, neck stiffness, dental problem, voice change and tinnitus.   Eyes: Negative for pain, discharge and visual disturbance.  Respiratory: Negative for cough, chest tightness, wheezing and stridor.   Cardiovascular: Positive for chest pain and leg swelling. Negative for palpitations.  Gastrointestinal: Negative for nausea, vomiting, abdominal pain, diarrhea, constipation, blood in stool and abdominal distention.  Genitourinary: Negative for urgency, hematuria, flank pain, discharge, difficulty urinating and genital sores.  Musculoskeletal: Negative for myalgias, back pain, joint swelling,  arthralgias and gait problem.  Skin: Negative for rash.  Neurological: Negative for dizziness, syncope, speech difficulty, weakness, numbness and headaches.  Hematological: Negative for adenopathy. Does not bruise/bleed easily.  Psychiatric/Behavioral: Negative for behavioral problems and dysphoric mood. The patient is not nervous/anxious.        Objective:   Physical Exam  Constitutional: He is oriented to person, place, and time. He appears well-developed.  Morbidly obese  HENT:  Head: Normocephalic.  Right Ear: External ear normal.  Left Ear: External ear normal.  Eyes: Conjunctivae and EOM are normal.  Neck: Normal range of motion.  Cardiovascular: Normal rate and normal heart sounds.   Pulmonary/Chest: Effort normal and breath sounds normal.  No rub O2 saturation 96  Abdominal: Bowel sounds are normal.  Musculoskeletal: Normal range of motion. He exhibits no edema and no tenderness.  Neurological: He is alert and oriented to person, place, and time.  Psychiatric: He has a normal mood and affect. His behavior is normal.          Assessment & Plan:   Status post pulmonary embolism. Patient on the chronic anticoagulation presently he is on Xarelto 50 mg twice a day to complete 21 days therapy.  Patient has chronic kidney disease but does reduction is not recommended if creatinine clearance is greater than 30. A new prescription for Xarelto 20 mg dispensed.  It was stressed that he needs to take this medication indefinitely.  No strong indication for aspirin. This will be discontinued   Hypertension stable Morbid obesity Chronic kidney disease

## 2013-09-04 NOTE — Patient Instructions (Addendum)
Limit your sodium (Salt) intake  Please check your blood pressure on a regular basis.  If it is consistently greater than 150/90, please make an office appointment.  Return in 3 months for follow-up   Discontinue aspirin

## 2013-09-08 ENCOUNTER — Encounter: Payer: Self-pay | Admitting: Cardiovascular Disease

## 2013-09-08 ENCOUNTER — Ambulatory Visit (INDEPENDENT_AMBULATORY_CARE_PROVIDER_SITE_OTHER): Payer: Managed Care, Other (non HMO) | Admitting: Cardiovascular Disease

## 2013-09-08 ENCOUNTER — Other Ambulatory Visit: Payer: Self-pay | Admitting: Internal Medicine

## 2013-09-08 VITALS — BP 126/74 | HR 80 | Resp 16 | Ht 71.0 in | Wt 373.5 lb

## 2013-09-08 DIAGNOSIS — I7121 Aneurysm of the ascending aorta, without rupture: Secondary | ICD-10-CM | POA: Insufficient documentation

## 2013-09-08 DIAGNOSIS — I714 Abdominal aortic aneurysm, without rupture, unspecified: Secondary | ICD-10-CM

## 2013-09-08 DIAGNOSIS — Z79899 Other long term (current) drug therapy: Secondary | ICD-10-CM

## 2013-09-08 DIAGNOSIS — N289 Disorder of kidney and ureter, unspecified: Secondary | ICD-10-CM

## 2013-09-08 DIAGNOSIS — I8290 Acute embolism and thrombosis of unspecified vein: Secondary | ICD-10-CM

## 2013-09-08 DIAGNOSIS — I749 Embolism and thrombosis of unspecified artery: Secondary | ICD-10-CM

## 2013-09-08 DIAGNOSIS — R5383 Other fatigue: Secondary | ICD-10-CM

## 2013-09-08 DIAGNOSIS — R5381 Other malaise: Secondary | ICD-10-CM

## 2013-09-08 DIAGNOSIS — I712 Thoracic aortic aneurysm, without rupture, unspecified: Secondary | ICD-10-CM

## 2013-09-08 DIAGNOSIS — I10 Essential (primary) hypertension: Secondary | ICD-10-CM

## 2013-09-08 NOTE — Assessment & Plan Note (Signed)
An asymptomatic incidental finding initially described on a CT angiogram of the chest in December of 2011 when the aneurysm measured 42 mm and was unchanged on the followup study in February of 2013. The recent echocardiogram suggests that there has been interval enlargement of the ascending aorta which now measures 49 mm. We discussed the fact that the 2 studies are not directly comparable and echo measurements may overestimate the true diameter of the aneurysm secondary to off axis cross sectional cuts. Nevertheless it is important to reevaluate the size of this aneurysm with an appropriate radiological study. Unfortunately this is a bit of a conundrum. He has moderate chronic kidney disease and iodinated contrast is preferably avoided. He is a very broad chested man and I doubt that he will fit in the conventional cardiac MRI scanner. Gadolinium contrast as his own issues with renal insufficiency. I have asked for the advice of her couple of imaging specialists and cardiologists. They have been very helpful. I think the most appropriate study will actually be a noncontrast CT of the chest especially since we do not suspect dissection either on clinical grounds or by the recent echocardiogram. The development of new aneurysmal dilatation in different segments of the aorta and this man has had an abdominal aortic aneurysm stent graft repair raises the possibility of connective tissue disorders. He does not have a history of joint dislocation, ocular problems, skin problems to suggest Marfan's syndrome or Ehlers-Danlos syndrome. There is no family history of premature unexplained sudden death or known aneurysms.

## 2013-09-08 NOTE — Progress Notes (Signed)
Patient ID: [name], male   DOB: [birth year], 42 y.o.   MRN: [id number]      Reason for office visit Followup after recent hospitalization for DVT/PE; aneurysm of the ascending aorta  Mr. Mccord had a couple of episodes of atypical chest pain until a diagnosis of venous thromboembolic disease was confirmed. Lower extremity venous duplex studies show fresh noncompressible clot in the left infrapopliteal distribution. [name] d-dimer was severely elevated. [name] had pleuritic right-sided chest pain and hypoxemia and a diagnosis of pulmonary embolism was made. Confirmation with the usual imaging studies is not possible. [name] has renal insufficiency which made contrast administration risky and [name] is too large for a VQ scan.  As part of chest pain workup she also underwent an echocardiogram. This showed known findings of left ventricular hypertrophy and preserved left ventricular systolic function but showed a surprising increase in the diameter of what used to be a small ascending aortic aneurysm. By CT angiography this was estimated to be stable at 4.2 cm in 2011 and 2013. The echo now measures it at 4.9 cm. surprisingly previously mild to moderate aortic insufficiency was now described as trivial    No Known Allergies  Current Outpatient Prescriptions  Medication Sig Dispense Refill  . atorvastatin (LIPITOR) 10 MG tablet Take 1 tablet (10 mg total) by mouth daily.  90 tablet  3  . calcitRIOL (ROCALTROL) 0.25 MCG capsule Take 0.25 mcg by mouth every Monday, Wednesday, and Friday.      . carvedilol (COREG) 25 MG tablet Take 2 tablets twice daily.  120 tablet  1  . furosemide (LASIX) 20 MG tablet Take 20 mg by mouth daily as needed for edema.      [name] HYDROcodone-acetaminophen (NORCO) 5-325 MG per tablet Take 1 tablet by mouth every 6 (six) hours as needed for pain.  30 tablet  0  . Olmesartan-Amlodipine-HCTZ (TRIBENZOR) 40-10-25 MG TABS Take 1 tablet by mouth daily.  90 tablet  6  .  Rivaroxaban (XARELTO) 20 MG TABS tablet Take 1 tablet (20 mg total) by mouth daily.  90 tablet  4   No current facility-administered medications for this visit.    Past Medical History  Diagnosis Date  . Chronic kidney disease   . History of pulmonary embolus (PE) 2010  . 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 Left ventricular hypertrophy   . Abdominal aortic aneurysm   . Stroke 2009    pt says no stroke  . Hypertension     [name]  . Anxiety   . DVT (deep venous thrombosis)   . Obesity   . Chronic renal disease     advanced  . Aortic regurgitation 12/10/08    Echo: mild to mod. AOV regurg,mild AO root dilatation, LA mildly dilated, EF 65%, LV wall thickness markedly increased, small PE    Past Surgical History  Procedure Laterality Date  . Cardiac catheterization  02/11/10    false + Nuc  . Corneal transplant    . Intravascular ultrasound  09/25/2012    Procedure: INTRAVASCULAR ULTRASOUND;  Surgeon: Mal [name], MD;  Location: [facility];  Service: Vascular;  Laterality: N/A;  . Abdominal aortic aneurysm repair  09/25/2012    EVAR    Family History  Problem Relation Age of Onset  . Hypertension Mother   . Hypertension Father     History   Social History  . Marital Status: Married    Spouse Name: N/A  Number of Children: N/A  . Years of Education: N/A   Occupational History  . Not on file.   Social History Main Topics  . Smoking status: Never Smoker   . Smokeless tobacco: Never Used  . Alcohol Use: No  . Drug Use: No  . Sexual Activity: Not Currently    Partners: Female   Other Topics Concern  . Not on file   Social History Narrative  . No narrative on file    Review of systems: Leg swelling and pain have improved. [name] no longer has pleurisy or shortness of breath. [name] denies chest pain of any kind at this time. [name] has not had syncope or palpitations. [name] has chronic mild bilateral foot and ankle edema. [name] denies focal  neurological deficits or palpitations. [name] has not had any bleeding problems. [name] denies abdominal pain nausea, vomiting, dysphagia, diarrhea, constipation. [name] denies hematuria, dysuria, frequency, urgency. [name] has not had any skin rashes. [name] denies intolerance to heat or cold. Has not had fever or chills or cough or hemoptysis. [name] mood is normal.  PHYSICAL EXAM BP 126/74  Pulse 80  Resp 16  Ht 5\' 11"  (1.803 m)  Wt 373 lb 8 oz (169.418 kg)  BMI 52.12 kg/m2 Morbid obesity limits a detailed exam  General: Alert, oriented x3, no distress Head: no evidence of trauma, PERRL, EOMI, no exophtalmos or lid lag, no myxedema, no xanthelasma; normal ears, nose and crowded oropharynx Neck: normal jugular venous pulsations and no hepatojugular reflux; brisk carotid pulses without delay and no carotid bruits Chest: clear to auscultation, no signs of consolidation by percussion or palpation, normal fremitus, symmetrical and full respiratory excursions Cardiovascular: Cannot define the apical impulse, regular rhythm, normal first and second heart sounds, no murmurs, rubs or gallops Abdomen: no tenderness or distention, no masses by palpation, no abnormal pulsatility or arterial bruits, normal bowel sounds, no hepatosplenomegaly Extremities: no clubbing, cyanosis; 1+ ankle edema symmetrically; 2+ radial, ulnar and brachial pulses bilaterally; 2+ right femoral, posterior tibial and dorsalis pedis pulses; 2+ left femoral, posterior tibial and dorsalis pedis pulses; no subclavian or femoral bruits Neurological: grossly nonfocal   ECG: Sinus rhythm, first-degree AV block, chronically inverted T waves in the lateral leads most likely representing secondary repolarization changes of LVH. Mildly prolonged QT interval.  Lipid Panel     Component Value Date/Time   CHOL 140 02/29/2012 0829   TRIG 72 02/29/2012 0829   HDL 42 02/29/2012 0829   CHOLHDL 3.3 02/29/2012 0829   VLDL 14 02/29/2012 0829   LDLCALC 84 02/29/2012 0829     BMET    Component Value Date/Time   NA 135 09/01/2013 0455   K 3.6 09/01/2013 0455   CL 99 09/01/2013 0455   CO2 26 09/01/2013 0455   GLUCOSE 112* 09/01/2013 0455   BUN 35* 09/01/2013 0455   CREATININE 2.23* 09/01/2013 0455   CREATININE 2.47* 08/14/2013 0930   CALCIUM 9.2 09/01/2013 0455   GFRNONAA 34* 09/01/2013 0455   GFRAA 39* 09/01/2013 0455     ASSESSMENT AND PLAN Ascending aortic aneurysm An asymptomatic incidental finding initially described on a CT angiogram of the chest in December of 2011 when the aneurysm measured 42 mm and was unchanged on the followup study in February of 2013. The recent echocardiogram suggests that there has been interval enlargement of the ascending aorta which now measures 49 mm. We discussed the fact that the 2 studies are not directly comparable and echo measurements may overestimate the true diameter  of the aneurysm secondary to off axis cross sectional cuts. Nevertheless it is important to reevaluate the size of this aneurysm with an appropriate radiological study. Unfortunately this is a bit of a conundrum. [name] has moderate chronic kidney disease and iodinated contrast is preferably avoided. [name] is a very broad chested man and I doubt that [name] will fit in the conventional cardiac MRI scanner. Gadolinium contrast as [name] own issues with renal insufficiency. I have asked for the advice of her couple of imaging specialists and cardiologists. They have been very helpful. I think the most appropriate study will actually be a noncontrast CT of the chest especially since we do not suspect dissection either on clinical grounds or by the recent echocardiogram. The development of new aneurysmal dilatation in different segments of the aorta and this man has had an abdominal aortic aneurysm stent graft repair raises the possibility of connective tissue disorders. [name] does not have a history of joint dislocation, ocular problems, skin problems to suggest Marfan's syndrome or Ehlers-Danlos  syndrome. There is no family history of premature unexplained sudden death or known aneurysms.  h/o VTE (venous thromboembolism) [name] had bilateral infrapopliteal DVTs in May of 2010. I don't think there was a precipitant event. [name] next DVT episode (right femoral) appeared to be related to an angiographic procedure, but this last episode of venous thromboembolism occurred spontaneously while [name] was walking and not immobilized and occurred on the contralateral side.   I think [name] should remain on lifelong anticoagulation. I don't think that [name] had a full hypercoagulable workup done and some of the tests are no longer accessible since [name] is fully anticoagulated. Also don't think that there would be a big change in therapy if we did identify a hypercoagulable state. At this point no activity restrictions are indicated. We reviewed the fact that this anticoagulant has a short duration of action and strict compliance is important. [name] renal function abnormalities are noted but I think full dose anticoagulation still appropriate.  HYPERTENSION, BENIGN ESSENTIAL This is actually well controlled on maximum doses of angiotensin receptor blocker, calcium channel blocker, carvedilol (in a very high dose) and a moderate dose of loop diuretic. Increasing the carvedilol to 50 mg twice a day seems to have made the biggest difference. No changes are planned.  RENAL DISEASE, CHRONIC [name] creatinine seems to been relatively stable over the last couple of years with a typical reading between 2 and 2.2 mg/dL. [name] creatinine clearance is estimated to be about 35 mL per minute. Contrast base procedure is a nonsteroidal anti-inflammatory drug should be carefully avoided.  Abdominal aneurysm without mention of rupture Now roughly one year status post stent graft repair, [name] received a good report from Jack. Kellie Simmering when last evaluated.  Obesity, morbid, BMI 50 or higher I think this is an important contributor to many of [name]  health problems, but does not probably directly explain [name] aortic aneurysms.  Orders Placed This Encounter  Procedures  . Comp Met (CMET)  . CBC   Meds ordered this encounter  Medications  . furosemide (LASIX) 20 MG tablet    Sig: Take 20 mg by mouth daily as needed for edema.    Holli Humbles, MD, [facility] [phone] office [phone] pager

## 2013-09-08 NOTE — Assessment & Plan Note (Signed)
Now roughly one year status post stent graft repair, he received a good report from Dr. Kellie Simmering when last evaluated.

## 2013-09-08 NOTE — Assessment & Plan Note (Signed)
His creatinine seems to been relatively stable over the last couple of years with a typical reading between 2 and 2.2 mg/dL. His creatinine clearance is estimated to be about 35 mL per minute. Contrast base procedure is a nonsteroidal anti-inflammatory drug should be carefully avoided.

## 2013-09-08 NOTE — Patient Instructions (Signed)
Dr. Sallyanne Kuster will call about the CAT Scan vs MRI of the Thoracic chest once decided.  Your physician recommends that you return for lab work in: one week.  Your physician recommends that you schedule a follow-up appointment in: 6 Months.

## 2013-09-08 NOTE — Assessment & Plan Note (Signed)
He had bilateral infrapopliteal DVTs in May of 2010. I don't think there was a precipitant event. His next DVT episode (right femoral) appeared to be related to an angiographic procedure, but this last episode of venous thromboembolism occurred spontaneously while he was walking and not immobilized and occurred on the contralateral side.   I think he should remain on lifelong anticoagulation. I don't think that he had a full hypercoagulable workup done and some of the tests are no longer accessible since he is fully anticoagulated. Also don't think that there would be a big change in therapy if we did identify a hypercoagulable state. At this point no activity restrictions are indicated. We reviewed the fact that this anticoagulant has a short duration of action and strict compliance is important. His renal function abnormalities are noted but I think full dose anticoagulation still appropriate.

## 2013-09-08 NOTE — Assessment & Plan Note (Signed)
I think this is an important contributor to many of his health problems, but does not probably directly explain his aortic aneurysms.

## 2013-09-08 NOTE — Assessment & Plan Note (Signed)
This is actually well controlled on maximum doses of angiotensin receptor blocker, calcium channel blocker, carvedilol (in a very high dose) and a moderate dose of loop diuretic. Increasing the carvedilol to 50 mg twice a day seems to have made the biggest difference. No changes are planned.

## 2013-09-09 ENCOUNTER — Telehealth: Payer: Self-pay | Admitting: *Deleted

## 2013-09-09 DIAGNOSIS — I7121 Aneurysm of the ascending aorta, without rupture: Secondary | ICD-10-CM

## 2013-09-09 DIAGNOSIS — I712 Thoracic aortic aneurysm, without rupture: Secondary | ICD-10-CM

## 2013-09-09 DIAGNOSIS — I714 Abdominal aortic aneurysm, without rupture, unspecified: Secondary | ICD-10-CM

## 2013-09-09 NOTE — Telephone Encounter (Signed)
Patient aware of test to be performed to determine the size of the ascending aortic aneurysm.  Order sent to scheduling.

## 2013-09-09 NOTE — Telephone Encounter (Signed)
Message copied by Tressa Busman on Tue Sep 09, 2013  8:10 AM ------      Message from: Sanda Klein      Created: Mon Sep 08, 2013 10:41 PM       Please schedule him for a non-contrast CT of the chest or measurements of ascending aortic aneurysm size. You may get a call that you do need to use contrast for assessment of the aorta. Please reiterate that we want a non-contrast study. Tell me when it is scheduled so I can talk with the radiologist who will interpret it.      Thanks ------

## 2013-09-12 ENCOUNTER — Telehealth: Payer: Self-pay | Admitting: *Deleted

## 2013-09-12 ENCOUNTER — Telehealth: Payer: Self-pay | Admitting: Cardiovascular Disease

## 2013-09-12 DIAGNOSIS — I7121 Aneurysm of the ascending aorta, without rupture: Secondary | ICD-10-CM

## 2013-09-12 DIAGNOSIS — I712 Thoracic aortic aneurysm, without rupture: Secondary | ICD-10-CM

## 2013-09-12 NOTE — Telephone Encounter (Signed)
Order for CTA changed to CT chest w/o contrast per Dr. Loletha Grayer.

## 2013-09-12 NOTE — Telephone Encounter (Signed)
Please call-she need to talk to about CT scan he is scheduled for next Wednesday-She have some concerns,

## 2013-09-12 NOTE — Telephone Encounter (Signed)
Returned call to Levada Dy, pt's wife.  Stated pt is supposed to have CT scan next Wednesday and wanted to know if Dr. Loletha Grayer talked to pt's kidney doctor b/c pt has kidney disease and she is concerned for him to have the test w/ contrast dye.  Wife informed CT ordered without contrast.  Verbalized understanding.

## 2013-09-15 LAB — CBC
Hemoglobin: 12 g/dL — ABNORMAL LOW (ref 13.0–17.0)
RBC: 4.53 MIL/uL (ref 4.22–5.81)

## 2013-09-15 LAB — COMPREHENSIVE METABOLIC PANEL
Albumin: 3.6 g/dL (ref 3.5–5.2)
BUN: 26 mg/dL — ABNORMAL HIGH (ref 6–23)
CO2: 25 mEq/L (ref 19–32)
Calcium: 9.4 mg/dL (ref 8.4–10.5)
Chloride: 104 mEq/L (ref 96–112)
Glucose, Bld: 93 mg/dL (ref 70–99)
Potassium: 3.9 mEq/L (ref 3.5–5.3)

## 2013-09-17 ENCOUNTER — Ambulatory Visit (HOSPITAL_COMMUNITY)
Admission: RE | Admit: 2013-09-17 | Discharge: 2013-09-17 | Disposition: A | Discharge status: Home / Self Care | Payer: Managed Care, Other (non HMO) | Source: Ambulatory Visit | Attending: Cardiovascular Disease | Admitting: Cardiovascular Disease

## 2013-09-17 DIAGNOSIS — I712 Thoracic aortic aneurysm, without rupture, unspecified: Secondary | ICD-10-CM | POA: Insufficient documentation

## 2013-09-17 DIAGNOSIS — I7121 Aneurysm of the ascending aorta, without rupture: Secondary | ICD-10-CM

## 2013-09-17 DIAGNOSIS — N62 Hypertrophy of breast: Secondary | ICD-10-CM | POA: Insufficient documentation

## 2013-09-17 DIAGNOSIS — Z48812 Encounter for surgical aftercare following surgery on the circulatory system: Secondary | ICD-10-CM | POA: Insufficient documentation

## 2013-09-17 DIAGNOSIS — I2510 Atherosclerotic heart disease of native coronary artery without angina pectoris: Secondary | ICD-10-CM | POA: Insufficient documentation

## 2013-09-17 DIAGNOSIS — E0789 Other specified disorders of thyroid: Secondary | ICD-10-CM | POA: Insufficient documentation

## 2013-09-17 DIAGNOSIS — K8020 Calculus of gallbladder without cholecystitis without obstruction: Secondary | ICD-10-CM | POA: Insufficient documentation

## 2013-09-18 ENCOUNTER — Telehealth: Payer: Self-pay | Admitting: *Deleted

## 2013-09-18 DIAGNOSIS — J984 Other disorders of lung: Secondary | ICD-10-CM

## 2013-09-18 NOTE — Telephone Encounter (Signed)
Repeat CT of chest w/o contrast scheduled for mid-late December 2014 for 3 month repeat to reassess ?scarring/nodule in the RLL at [place].  Message sent to scheduling.

## 2013-09-18 NOTE — Telephone Encounter (Signed)
Message copied by Tressa Busman on Thu Sep 18, 2013  1:36 PM ------      Message from: Sanda Klein      Created: Wed Sep 17, 2013  6:50 PM       Ascending aortic aneurysm is small and unchanged from February 2013. Radiologist recommends another look at scarring in right upper lung in 3 months. Please schedule (NO contrast) ------

## 2013-10-27 ENCOUNTER — Ambulatory Visit: Payer: Managed Care, Other (non HMO) | Admitting: Cardiovascular Disease

## 2013-10-30 ENCOUNTER — Other Ambulatory Visit: Payer: Self-pay

## 2013-11-04 ENCOUNTER — Encounter: Payer: Self-pay | Admitting: Cardiovascular Disease

## 2013-11-04 ENCOUNTER — Ambulatory Visit (INDEPENDENT_AMBULATORY_CARE_PROVIDER_SITE_OTHER): Payer: Managed Care, Other (non HMO) | Admitting: Cardiovascular Disease

## 2013-11-04 VITALS — BP 132/70 | HR 80 | Resp 16 | Ht 72.0 in | Wt 371.9 lb

## 2013-11-04 DIAGNOSIS — I10 Essential (primary) hypertension: Secondary | ICD-10-CM

## 2013-11-04 DIAGNOSIS — I509 Heart failure, unspecified: Secondary | ICD-10-CM

## 2013-11-04 DIAGNOSIS — I712 Thoracic aortic aneurysm, without rupture, unspecified: Secondary | ICD-10-CM

## 2013-11-04 DIAGNOSIS — I749 Embolism and thrombosis of unspecified artery: Secondary | ICD-10-CM

## 2013-11-04 DIAGNOSIS — I5030 Unspecified diastolic (congestive) heart failure: Secondary | ICD-10-CM

## 2013-11-04 DIAGNOSIS — I8290 Acute embolism and thrombosis of unspecified vein: Secondary | ICD-10-CM

## 2013-11-04 DIAGNOSIS — I7121 Aneurysm of the ascending aorta, without rupture: Secondary | ICD-10-CM

## 2013-11-04 MED ORDER — OLMESARTAN-AMLODIPINE-HCTZ 40-10-25 MG PO TABS: 1.0000 | ORAL_TABLET | Freq: Every day | ORAL | Status: DC

## 2013-11-04 NOTE — Patient Instructions (Signed)
Your physician recommends that you schedule a follow-up appointment in: One Year.

## 2013-11-04 NOTE — Assessment & Plan Note (Signed)
Patient is scheduled for repeat CT (noncontrast due to CKD) chest in December. Will continue to monitor. Yearly follow up otherwise.

## 2013-11-04 NOTE — Assessment & Plan Note (Signed)
Well controlled continue current medications (olmesartan, amlodipine, hctz, and carvedilol) as well as lasix which is essentially scheduled. [name]

## 2013-11-04 NOTE — Assessment & Plan Note (Signed)
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 Tolerating Xarelto-will continue.

## 2013-11-04 NOTE — Progress Notes (Signed)
Patient ID: [name], male   DOB: [birth year], 42 y.o.   MRN: [id number]  Reason for office visit Aneurysm of the ascending aorta; hypertension; diastolic CHF; History DVT/PE  Regarding history PE/DVT,  patient is on Xarelto 20mg  and has not had recurrence of atypical chest pain since being seen on this regimen. This is now patient's 3rd event and he will require lifelong therapy.   During workup for chest pain, patient had an echocardiogram which showed LVH with preserved EF and grade I diastolic dysfunction. Patient has been on lasix 20mg  daily essentially to help with worsening lower extremity edema and weight gain from 420 to near 470 of which at least 30 lbs was thought to be fluid related. Patient has only hast 4 lbs but his edema has resolved. Patient was also found to have a small ascending aortic aneurysm. Patient currently is planned to have repeat imaging by CT of his chest this December which was previously stable at 4.2 cm in 2011 and 2013.   Patient is compliant with blood pressure medications. He does not monitor regularly at home. No chest pain or shortness of breath.   No Known Allergies  Current Outpatient Prescriptions  Medication Sig Dispense Refill  . atorvastatin (LIPITOR) 10 MG tablet Take 1 tablet (10 mg total) by mouth daily.  90 tablet  3  . calcitRIOL (ROCALTROL) 0.25 MCG capsule Take 0.25 mcg by mouth every Monday, Wednesday, and Friday.      . carvedilol (COREG) 25 MG tablet TAKE 2 TABLETS TWICE DAILY.  120 tablet  5  . furosemide (LASIX) 20 MG tablet Take 20 mg by mouth daily as needed for edema.      . Olmesartan-Amlodipine-HCTZ (TRIBENZOR) 40-10-25 MG TABS Take 1 tablet by mouth daily.  42 tablet  0  . Rivaroxaban (XARELTO) 20 MG TABS tablet Take 1 tablet (20 mg total) by mouth daily.  90 tablet  4   No current facility-administered medications for this visit.    Past Medical History  Diagnosis Date  . Chronic kidney disease   . History of  pulmonary embolus (PE) 2010  . 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 Left ventricular hypertrophy   . Abdominal aortic aneurysm   . Stroke 2009    pt says no stroke  . Hypertension     dr [name]  . Anxiety   . DVT (deep venous thrombosis)   . Obesity   . Chronic renal disease     advanced  . Aortic regurgitation 12/10/08    Echo: mild to mod. AOV regurg,mild AO root dilatation, LA mildly dilated, EF 65%, LV wall thickness markedly increased, small PE    Past Surgical History  Procedure Laterality Date  . Cardiac catheterization  02/11/10    false + Nuc  . Corneal transplant    . Intravascular ultrasound  09/25/2012    Procedure: INTRAVASCULAR ULTRASOUND;  Surgeon: Mal [name], MD;  Location: [facility];  Service: Vascular;  Laterality: N/A;  . Abdominal aortic aneurysm repair  09/25/2012    EVAR    Family History  Problem Relation Age of Onset  . Hypertension Mother   . Hypertension Father     History   Social History  . Marital Status: Married    Spouse Name: N/A    Number of Children: N/A  . Years of Education: N/A   Occupational History  . Not on file.   Social History Main Topics  .  Smoking status: Never Smoker   . Smokeless tobacco: Never Used  . Alcohol Use: No  . Drug Use: No  . Sexual Activity: Not Currently    Partners: Female   Other Topics Concern  . Not on file   Social History Narrative  . No narrative on file    Review of systems: Leg swelling is minimal at times as long as he takes 20mg  lasix daily. No leg pain. No chest pain or shortness of breath. Patient has been walking for exercise and tolerating this well. He wants to increase the level of his exercise. He denies syncope, lightheadedness or palpitations. He states he occasionally has some bleeding in his gums which is short term. No melena or hematochezia or BRBPR.   PHYSICAL EXAM BP 132/70  Pulse 80  Ht 6' (1.829 m)  Wt 371 lb 14.4 oz (168.693 kg)   BMI 50.43 kg/m2 Morbid obesity limits a detailed exam  General: Alert, oriented x3, no distress Neck:  brisk carotid pulses without delay and no carotid bruits Chest: clear to auscultation bilaterally, no wheezes, rales, or rhonchi Cardiovascular: Cannot define the apical impulse, regular rhythm, normal first and second heart sounds, no murmurs, rubs or gallops Abdomen: no tenderness or distention, protuberant Extremities: no clubbing, cyanosis; trace ankle edema symmetrically; 2+ radial pulses bilaterally Neurological: grossly normal, moves all extremities  Lipid Panel     Component Value Date/Time   CHOL 140 02/29/2012 0829   TRIG 72 02/29/2012 0829   HDL 42 02/29/2012 0829   CHOLHDL 3.3 02/29/2012 0829   VLDL 14 02/29/2012 0829   LDLCALC 84 02/29/2012 0829    BMET    Component Value Date/Time   NA 137 09/15/2013 0845   K 3.9 09/15/2013 0845   CL 104 09/15/2013 0845   CO2 25 09/15/2013 0845   GLUCOSE 93 09/15/2013 0845   BUN 26* 09/15/2013 0845   CREATININE 2.05* 09/15/2013 0845   CREATININE 2.23* 09/01/2013 0455   CALCIUM 9.4 09/15/2013 0845   GFRNONAA 34* 09/01/2013 0455   GFRAA 39* 09/01/2013 0455     ASSESSMENT AND PLAN No problem-specific assessment & plan notes found for this encounter.  No orders of the defined types were placed in this encounter.   Meds ordered this encounter  Medications  . Olmesartan-Amlodipine-HCTZ (TRIBENZOR) 40-10-25 MG TABS    Sig: Take 1 tablet by mouth daily.    Dispense:  42 tablet    Refill:  0   Dr. Sallyanne Kuster has seen and evaluated the patient. We have discussed the history, exam, assessment, and plan as noted above. He agrees with management.   Brayton Mars. Melanee Spry, MD, [facility] 11/04/2013 5:14 PM  Sanda Klein, MD, [facility] [phone] office [phone] pager

## 2013-11-04 NOTE — Assessment & Plan Note (Signed)
Fluid status has improved though weight is still increased. Will continue lasix 20mg  daily at present.

## 2013-11-09 ENCOUNTER — Encounter: Payer: Self-pay | Admitting: Cardiovascular Disease

## 2013-12-01 ENCOUNTER — Encounter: Payer: Self-pay | Admitting: Vascular Surgery

## 2013-12-01 ENCOUNTER — Other Ambulatory Visit: Payer: Self-pay

## 2013-12-01 DIAGNOSIS — I714 Abdominal aortic aneurysm, without rupture, unspecified: Secondary | ICD-10-CM

## 2013-12-02 ENCOUNTER — Encounter: Payer: Self-pay | Admitting: Vascular Surgery

## 2013-12-02 ENCOUNTER — Ambulatory Visit
Admission: RE | Admit: 2013-12-02 | Discharge: 2013-12-02 | Disposition: A | Discharge status: Home / Self Care | Payer: Managed Care, Other (non HMO) | Source: Ambulatory Visit | Attending: Vascular Surgery | Admitting: Vascular Surgery

## 2013-12-02 ENCOUNTER — Ambulatory Visit (INDEPENDENT_AMBULATORY_CARE_PROVIDER_SITE_OTHER): Payer: Managed Care, Other (non HMO) | Admitting: Vascular Surgery

## 2013-12-02 VITALS — BP 134/90 | HR 73 | Ht 72.0 in | Wt 378.0 lb

## 2013-12-02 DIAGNOSIS — Z48812 Encounter for surgical aftercare following surgery on the circulatory system: Secondary | ICD-10-CM

## 2013-12-02 DIAGNOSIS — I714 Abdominal aortic aneurysm, without rupture, unspecified: Secondary | ICD-10-CM

## 2013-12-02 NOTE — Addendum Note (Signed)
Addended by: Mena Goes on: 12/02/2013 11:27 AM   Modules accepted: Orders

## 2013-12-02 NOTE — Progress Notes (Signed)
Subjective:     Patient ID: [name], male   DOB: [birth year], 42 y.o.   MRN: [id number]  HPI this 42 year old male returns for continued followup regarding his aortic stent graft placement which was performed in October of 2013 or abdominal aortic aneurysm with an acute aortic dissection within the aneurysm sac which extended into the left common iliac artery. He has done well since that time. He does have moderate renal insufficiency therefore contrast cannot be used for his followup CT scans. He denies any abdominal or back symptoms which are new.  Past Medical History  Diagnosis Date  . Chronic kidney disease   . History of pulmonary embolus (PE) 2010  . 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 Left ventricular hypertrophy   . Abdominal aortic aneurysm   . Stroke 2009    pt says no stroke  . Hypertension     dr [name]  . Anxiety   . DVT (deep venous thrombosis)   . Obesity   . Chronic renal disease     advanced  . Aortic regurgitation 12/10/08    Echo: mild to mod. AOV regurg,mild AO root dilatation, LA mildly dilated, EF 65%, LV wall thickness markedly increased, small PE    History  Substance Use Topics  . Smoking status: Never Smoker   . Smokeless tobacco: Never Used  . Alcohol Use: No    Family History  Problem Relation Age of Onset  . Hypertension Mother   . Hypertension Father     No Known Allergies  Current outpatient prescriptions:atorvastatin (LIPITOR) 10 MG tablet, Take 1 tablet (10 mg total) by mouth daily., Disp: 90 tablet, Rfl: 3;  calcitRIOL (ROCALTROL) 0.25 MCG capsule, Take 0.25 mcg by mouth every Monday, Wednesday, and Friday., Disp: , Rfl: ;  carvedilol (COREG) 25 MG tablet, TAKE 2 TABLETS TWICE DAILY., Disp: 120 tablet, Rfl: 5;  furosemide (LASIX) 20 MG tablet, Take 20 mg by mouth daily as needed for edema., Disp: , Rfl:  Olmesartan-Amlodipine-HCTZ (TRIBENZOR) 40-10-25 MG TABS, Take 1 tablet by mouth daily., Disp:  42 tablet, Rfl: 0;  Rivaroxaban (XARELTO) 20 MG TABS tablet, Take 1 tablet (20 mg total) by mouth daily., Disp: 90 tablet, Rfl: 4  There were no vitals taken for this visit.  There is no weight on file to calculate BMI.           Review of Systems chest pain, dyspnea on exertion, PND, orthopnea, hemoptysis, claudication. Patient does have significant recent weight gain and bilateral edema. Denies any other symptoms in the complete review of systems    Objective:   Physical Exam BP 134/90  Pulse 73  Ht 6' (1.829 m)  Wt 378 lb (171.46 kg)  BMI 51.25 kg/m2  SpO2 100%  Gen.-alert and oriented x3 in no apparent distress-morbidly obese  HEENT normal for age Lungs no rhonchi or wheezing Cardiovascular regular rhythm no murmurs carotid pulses 3+ palpable no bruits audible Abdomen soft nontender no palpable masses-obese Musculoskeletal free of  major deformities Skin clear -no rashes Neurologic normal Lower extremities 3+ femoral and dorsalis pedis pulses palpable bilaterally with edema      Assessment:     Today I ordered a CT scan of the abdomen and pelvis which I reviewed and interpreted by computer. This was without contrast. The aneurysm stent graft is in good position unchanged from previous study in June of 2014. Maximum diameter of the aneurysm sac approximately 36 mm. This compares to  57 mm preoperatively.    Plan:     Return one year for continued followup of aortic stent graft for infrarenal abdominal aortic aneurysm with acute dissection and the left iliac artery. Patient has dilated aortic root and descending thoracic aorta. May need to get evaluation from TCT S. in the future if felt appropriate by Dr.Croitoru     VASCULAR QUALITY INITIATIVE FOLLOW UP DATA:  Current smoker: [  ] yes  [ x ] no  Living status: [x  ]  Home  [  ] Nursing home  [  ] Homeless    MEDS:  ASA [  ] yes  [ x ] no- [  ] medical reason  [  ] non compliant  STATIN  [x  ] yes  [  ] no-  [  ] medical reason  [  ] non compliant  Beta blocker [x  ] yes  [  ] no- [  ] medical reason  [  ] non compliant  ACE inhibitor [  ] yes  [x  ] no- [  ] medical reason  [  ] non compliant  P2Y12 Antagonist [x  ] none  [  ] clopidogrel-Plavix  [  ] ticlopidine-Ticlid   [  ] prasugrel-Effient  [  ] ticagrelor- Brilinta    Anticoagulant [  ] None  [  ] warfarin  [x  ] rivaroxaban-Xarelto [  ] dabigatran- Pradaxa  Current Max AAA = 36 mm  Endoleak:  [x  ] no  [  ] yes- [  ] type I  [  ] type II  [  ] type III  [  ] indeterminate  Number of new interventions: 0 -   Date:      Why:  [  ] endoleak  [  ] limb occlusion   [  ] growth  [  ]  Migration   [  ] symtomatic/ rupture    Conversion to open repair: [  ] yes   [x ] no   Date:   Other operation related to EVAR:  [  ] yes   [x  ] no

## 2013-12-04 ENCOUNTER — Telehealth: Payer: Self-pay | Admitting: Internal Medicine

## 2013-12-04 NOTE — Telephone Encounter (Signed)
done

## 2013-12-04 NOTE — Telephone Encounter (Signed)
Schedule where opening is if need to put two together can.

## 2013-12-04 NOTE — Telephone Encounter (Signed)
Pt needs cpe before 01/25/14 for insurance purposes.  jan ok. pls advise

## 2013-12-22 ENCOUNTER — Encounter: Payer: Self-pay | Admitting: Nephrology

## 2014-01-01 ENCOUNTER — Emergency Department (HOSPITAL_BASED_OUTPATIENT_CLINIC_OR_DEPARTMENT_OTHER)
Admission: EM | Admit: 2014-01-01 | Discharge: 2014-01-01 | Disposition: A | Discharge status: Home / Self Care | Payer: Managed Care, Other (non HMO) | Attending: Emergency Medicine | Admitting: Emergency Medicine

## 2014-01-01 ENCOUNTER — Emergency Department (HOSPITAL_BASED_OUTPATIENT_CLINIC_OR_DEPARTMENT_OTHER): Payer: Managed Care, Other (non HMO)

## 2014-01-01 ENCOUNTER — Encounter (HOSPITAL_BASED_OUTPATIENT_CLINIC_OR_DEPARTMENT_OTHER): Payer: Self-pay | Admitting: Emergency Medicine

## 2014-01-01 DIAGNOSIS — Z86711 Personal history of pulmonary embolism: Secondary | ICD-10-CM | POA: Insufficient documentation

## 2014-01-01 DIAGNOSIS — M79671 Pain in right foot: Secondary | ICD-10-CM

## 2014-01-01 DIAGNOSIS — Z7901 Long term (current) use of anticoagulants: Secondary | ICD-10-CM | POA: Insufficient documentation

## 2014-01-01 DIAGNOSIS — M7989 Other specified soft tissue disorders: Secondary | ICD-10-CM | POA: Insufficient documentation

## 2014-01-01 DIAGNOSIS — Z79899 Other long term (current) drug therapy: Secondary | ICD-10-CM | POA: Insufficient documentation

## 2014-01-01 DIAGNOSIS — Z95818 Presence of other cardiac implants and grafts: Secondary | ICD-10-CM | POA: Insufficient documentation

## 2014-01-01 DIAGNOSIS — I129 Hypertensive chronic kidney disease with stage 1 through stage 4 chronic kidney disease, or unspecified chronic kidney disease: Secondary | ICD-10-CM | POA: Insufficient documentation

## 2014-01-01 DIAGNOSIS — Z86718 Personal history of other venous thrombosis and embolism: Secondary | ICD-10-CM | POA: Insufficient documentation

## 2014-01-01 DIAGNOSIS — N189 Chronic kidney disease, unspecified: Secondary | ICD-10-CM | POA: Insufficient documentation

## 2014-01-01 DIAGNOSIS — Z8659 Personal history of other mental and behavioral disorders: Secondary | ICD-10-CM | POA: Insufficient documentation

## 2014-01-01 MED ORDER — OXYCODONE-ACETAMINOPHEN 5-325 MG PO TABS
1.0000 | ORAL_TABLET | Freq: Once | ORAL | Status: AC
  Administered 2014-01-01: 1 via ORAL

## 2014-01-01 NOTE — Discharge Instructions (Signed)
Your x-ray today shows no fracture. Apply ice to the area, elevate and follow up with your doctor. Take your pain medication as needed. Return here as needed.

## 2014-01-01 NOTE — ED Provider Notes (Signed)
Medical screening examination/treatment/procedure(s) were performed by non-physician practitioner and as supervising physician I was immediately available for consultation/collaboration.  EKG Interpretation   None        Threasa Beards, MD 01/01/14 2342

## 2014-01-01 NOTE — ED Notes (Signed)
Pt c/o right foot pain w/o injury x 2 days

## 2014-01-01 NOTE — ED Notes (Signed)
PA at bedside.

## 2014-01-01 NOTE — ED Provider Notes (Signed)
CSN: [id number]     Arrival date & time 01/01/14  1658 History   First MD Initiated Contact with Patient 01/01/14 1705     Chief Complaint  Patient presents with  . Foot Pain   (Consider location/radiation/quality/duration/timing/severity/associated sxs/prior Treatment) Patient is a 43 y.o. male presenting with lower extremity pain. The history is provided by the patient.  Foot Pain This is a new problem. The current episode started in the past 7 days. The problem occurs constantly. The problem has been gradually worsening. The symptoms are aggravated by standing and walking. He has tried nothing for the symptoms.   SEQUOIA PEPITO is a 43 y.o. male who presents to the ED with right foot pain that started 2 days ago. The pain is on the top of the foot. He had a Percocet at home left over from a long time ago when he had a blood clot and he took one at 9 am but now the pain is back. He had a lot of swelling in the foot and ankle yesterday but has improved a lot today.   Past Medical History  Diagnosis Date  . Chronic kidney disease   . History of pulmonary embolus (PE) 2010  . 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 Left ventricular hypertrophy   . Abdominal aortic aneurysm   . Stroke 2009    pt says no stroke  . Hypertension     dr [name]  . Anxiety   . DVT (deep venous thrombosis)   . Obesity   . Chronic renal disease     advanced  . Aortic regurgitation 12/10/08    Echo: mild to mod. AOV regurg,mild AO root dilatation, LA mildly dilated, EF 65%, LV wall thickness markedly increased, small PE   Past Surgical History  Procedure Laterality Date  . Cardiac catheterization  02/11/10    false + Nuc  . Corneal transplant    . Intravascular ultrasound  09/25/2012    Procedure: INTRAVASCULAR ULTRASOUND;  Surgeon: Mal [name], MD;  Location: [facility];  Service: Vascular;  Laterality: N/A;  . Abdominal aortic aneurysm repair  09/25/2012    EVAR    Family History  Problem Relation Age of Onset  . Hypertension Mother   . Hypertension Father    History  Substance Use Topics  . Smoking status: Never Smoker   . Smokeless tobacco: Never Used  . Alcohol Use: No    Review of Systems As stated in HPI  Allergies  Review of patient's allergies indicates no known allergies.  Home Medications   Current Outpatient Rx  Name  Route  Sig  Dispense  Refill  . atorvastatin (LIPITOR) 10 MG tablet   Oral   Take 1 tablet (10 mg total) by mouth daily.   90 tablet   3   . calcitRIOL (ROCALTROL) 0.25 MCG capsule   Oral   Take 0.25 mcg by mouth every Monday, Wednesday, and Friday.         . carvedilol (COREG) 25 MG tablet      TAKE 2 TABLETS TWICE DAILY.   120 tablet   5   . furosemide (LASIX) 20 MG tablet   Oral   Take 20 mg by mouth daily as needed for edema.         . Olmesartan-Amlodipine-HCTZ (TRIBENZOR) 40-10-25 MG TABS   Oral   Take 1 tablet by mouth daily.   42 tablet   0   .  Rivaroxaban (XARELTO) 20 MG TABS tablet   Oral   Take 1 tablet (20 mg total) by mouth daily.   90 tablet   4    BP 165/93  Pulse 83  Temp(Src) 98.4 F (36.9 C) (Oral)  Resp 18  Ht 6' (1.829 m)  Wt 378 lb (171.46 kg)  BMI 51.25 kg/m2  SpO2 98% Physical Exam  Nursing note and vitals reviewed. Constitutional: He is oriented to person, place, and time.  Morbidly obese  HENT:  Head: Atraumatic.  Eyes: Conjunctivae and EOM are normal.  Neck: Neck supple.  Cardiovascular: Normal rate.   Pulmonary/Chest: Effort normal.  Musculoskeletal:       Right foot: He exhibits tenderness and swelling. He exhibits normal range of motion, normal capillary refill, no crepitus, no deformity and no laceration.  Pedal pulse present, adequate circulation, good touch sensation. Pain with palpation dorsum of foot.   Neurological: He is alert and oriented to person, place, and time. He has normal strength. No cranial nerve deficit or sensory deficit.  Abnormal gait: due to pain.  Skin: Skin is warm and dry.  Psychiatric: He has a normal mood and affect. His behavior is normal.    ED Course  Procedures  Dg Foot Complete Right  01/01/2014   CLINICAL DATA:  Foot pain all over.  EXAM: RIGHT FOOT COMPLETE - 3+ VIEW  COMPARISON:  Right ankle 01/23/2005  FINDINGS: Negative for an acute fracture or dislocation. Alignment of the foot is within normal limits. Difficult to evaluate for soft tissue swelling.  IMPRESSION: No acute bone abnormality to the right foot.   Electronically Signed   By: Markus Daft M.D.   On: 01/01/2014 17:54    MDM  43 y.o. male with swelling and pain to the dorsum of the right foot. Ace wrap applied and he will elevate and rest the area and follow up with his PCP. He has Percocet at home for pain. Stable for discharge. Discussed with the patient clinical and x-ray findings and all questioned fully answered. He will return if any problems arise.    Medication List    ASK your doctor about these medications       atorvastatin 10 MG tablet  Commonly known as:  LIPITOR  Take 1 tablet (10 mg total) by mouth daily.     calcitRIOL 0.25 MCG capsule  Commonly known as:  ROCALTROL  Take 0.25 mcg by mouth every Monday, Wednesday, and Friday.     carvedilol 25 MG tablet  Commonly known as:  COREG  TAKE 2 TABLETS TWICE DAILY.     furosemide 20 MG tablet  Commonly known as:  LASIX  Take 20 mg by mouth daily as needed for edema.     Olmesartan-Amlodipine-HCTZ 40-10-25 MG Tabs  Commonly known as:  TRIBENZOR  Take 1 tablet by mouth daily.     Rivaroxaban 20 MG Tabs tablet  Commonly known as:  XARELTO  Take 1 tablet (20 mg total) by mouth daily.           [place], [place] 01/01/14 [id number]

## 2014-01-02 ENCOUNTER — Encounter: Payer: Self-pay | Admitting: Internal Medicine

## 2014-01-02 ENCOUNTER — Ambulatory Visit (INDEPENDENT_AMBULATORY_CARE_PROVIDER_SITE_OTHER): Payer: Managed Care, Other (non HMO) | Admitting: Internal Medicine

## 2014-01-02 VITALS — BP 120/80 | HR 75 | Temp 99.0°F | Resp 20 | Wt 374.9 lb

## 2014-01-02 DIAGNOSIS — N289 Disorder of kidney and ureter, unspecified: Secondary | ICD-10-CM

## 2014-01-02 DIAGNOSIS — L03115 Cellulitis of right lower limb: Secondary | ICD-10-CM

## 2014-01-02 DIAGNOSIS — I10 Essential (primary) hypertension: Secondary | ICD-10-CM

## 2014-01-02 DIAGNOSIS — L03119 Cellulitis of unspecified part of limb: Secondary | ICD-10-CM

## 2014-01-02 DIAGNOSIS — L02619 Cutaneous abscess of unspecified foot: Secondary | ICD-10-CM

## 2014-01-02 MED ORDER — CEPHALEXIN 500 MG PO CAPS: 500.0000 mg | ORAL_CAPSULE | Freq: Two times a day (BID) | ORAL | Status: DC

## 2014-01-02 NOTE — Progress Notes (Signed)
Subjective:    Patient ID: [name], male    DOB: 1971-03-28, 43 y.o.   MRN: [id number]  HPI 43 year old patient has chronic medical problems include hypertension peripheral vascular disease status post AAA repair and chronic kidney disease he has morbid obesity. For the past 3 days she has had right foot pain. He was in the ED yesterday and radiograph was normal. Pain seems to be maximal over the dorsal aspect of the foot. Today he has also developed some left foot pain. He has chronic lower extremity edema but he feels this is no worse than usual and not a factor with his pain.  Past Medical History  Diagnosis Date  . Chronic kidney disease   . History of pulmonary embolus (PE) 2010  . 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 Left ventricular hypertrophy   . Abdominal aortic aneurysm   . Stroke 2009    pt says no stroke  . Hypertension     dr [name]  . Anxiety   . DVT (deep venous thrombosis)   . Obesity   . Chronic renal disease     advanced  . Aortic regurgitation 12/10/08    Echo: mild to mod. AOV regurg,mild AO root dilatation, LA mildly dilated, EF 65%, LV wall thickness markedly increased, small PE    History   Social History  . Marital Status: Married    Spouse Name: N/A    Number of Children: N/A  . Years of Education: N/A   Occupational History  . Not on file.   Social History Main Topics  . Smoking status: Never Smoker   . Smokeless tobacco: Never Used  . Alcohol Use: No  . Drug Use: No  . Sexual Activity: Not Currently    Partners: Female   Other Topics Concern  . Not on file   Social History Narrative  . No narrative on file    Past Surgical History  Procedure Laterality Date  . Cardiac catheterization  02/11/10    false + Nuc  . Corneal transplant    . Intravascular ultrasound  09/25/2012    Procedure: INTRAVASCULAR ULTRASOUND;  Surgeon: Mal [name], MD;  Location: [facility];  Service: Vascular;   Laterality: N/A;  . Abdominal aortic aneurysm repair  09/25/2012    EVAR    Family History  Problem Relation Age of Onset  . Hypertension Mother   . Hypertension Father     No Known Allergies  Current Outpatient Prescriptions on File Prior to Visit  Medication Sig Dispense Refill  . atorvastatin (LIPITOR) 10 MG tablet Take 1 tablet (10 mg total) by mouth daily.  90 tablet  3  . calcitRIOL (ROCALTROL) 0.25 MCG capsule Take 0.25 mcg by mouth every Monday, Wednesday, and Friday.      . carvedilol (COREG) 25 MG tablet TAKE 2 TABLETS TWICE DAILY.  120 tablet  5  . furosemide (LASIX) 20 MG tablet Take 20 mg by mouth daily as needed for edema.      . Olmesartan-Amlodipine-HCTZ (TRIBENZOR) 40-10-25 MG TABS Take 1 tablet by mouth daily.  42 tablet  0  . Rivaroxaban (XARELTO) 20 MG TABS tablet Take 1 tablet (20 mg total) by mouth daily.  90 tablet  4   No current facility-administered medications on file prior to visit.    BP 120/80  Pulse 75  Temp(Src) 99 F (37.2 C) (Oral)  Resp 20  Wt 374 lb 14.4 oz (170.054 kg)  SpO2 98%      Review of Systems  Constitutional: Negative for fever, chills, appetite change and fatigue.  HENT: Negative for congestion, dental problem, ear pain, hearing loss, sore throat, tinnitus, trouble swallowing and voice change.   Eyes: Negative for pain, discharge and visual disturbance.  Respiratory: Negative for cough, chest tightness, wheezing and stridor.   Cardiovascular: Positive for leg swelling. Negative for chest pain and palpitations.  Gastrointestinal: Negative for nausea, vomiting, abdominal pain, diarrhea, constipation, blood in stool and abdominal distention.  Genitourinary: Negative for urgency, hematuria, flank pain, discharge, difficulty urinating and genital sores.  Musculoskeletal: Positive for arthralgias and gait problem. Negative for back pain, joint swelling, myalgias and neck stiffness.  Skin: Negative for rash.  Neurological:  Negative for dizziness, syncope, speech difficulty, weakness, numbness and headaches.  Hematological: Negative for adenopathy. Does not bruise/bleed easily.  Psychiatric/Behavioral: Negative for behavioral problems and dysphoric mood. The patient is not nervous/anxious.        Objective:   Physical Exam  Constitutional: He is oriented to person, place, and time. He appears well-developed.  HENT:  Head: Normocephalic.  Right Ear: External ear normal.  Left Ear: External ear normal.  Eyes: Conjunctivae and EOM are normal.  Neck: Normal range of motion.  Cardiovascular: Normal rate and normal heart sounds.   Pulmonary/Chest: Breath sounds normal.  Abdominal: Bowel sounds are normal.  Musculoskeletal: Normal range of motion. He exhibits edema. He exhibits no tenderness.  Tenderness and excessive warmth across the dorsal aspect of the right foot  +2 pedal edema  Neurological: He is alert and oriented to person, place, and time.  Psychiatric: He has a normal mood and affect. His behavior is normal.          Assessment & Plan:   Pedal edema Probable early cellulitis dorsal right foot pedal treat with Keflex for 7 days. Will elevate and continue careful diuresis. Will discontinue amlodipine. We'll reassess blood pressure in 4 weeks. We'll moderate salt intake.

## 2014-01-02 NOTE — Patient Instructions (Signed)
Limit your sodium (Salt) intake  Take your antibiotic as prescribed until ALL of it is gone, but stop if you develop a rash, swelling, or any side effects of the medication.  Contact our office as soon as possible if  there are side effects of the medication.  Return in one month for follow-up  Please check your blood pressure on a regular basis.  If it is consistently greater than 150/90, please make an office appointment.

## 2014-01-02 NOTE — Progress Notes (Signed)
Pre-visit discussion using our clinic review tool. No additional management support is needed unless otherwise documented below in the visit note.  

## 2014-01-06 ENCOUNTER — Encounter: Payer: Self-pay | Admitting: Internal Medicine

## 2014-01-06 ENCOUNTER — Ambulatory Visit (INDEPENDENT_AMBULATORY_CARE_PROVIDER_SITE_OTHER): Payer: Managed Care, Other (non HMO) | Admitting: Internal Medicine

## 2014-01-06 VITALS — BP 118/80 | HR 75 | Temp 98.2°F | Resp 20 | Wt 374.9 lb

## 2014-01-06 DIAGNOSIS — Z0000 Encounter for general adult medical examination without abnormal findings: Secondary | ICD-10-CM

## 2014-01-06 DIAGNOSIS — I5030 Unspecified diastolic (congestive) heart failure: Secondary | ICD-10-CM

## 2014-01-06 DIAGNOSIS — I10 Essential (primary) hypertension: Secondary | ICD-10-CM

## 2014-01-06 DIAGNOSIS — Z23 Encounter for immunization: Secondary | ICD-10-CM

## 2014-01-06 DIAGNOSIS — N289 Disorder of kidney and ureter, unspecified: Secondary | ICD-10-CM

## 2014-01-06 DIAGNOSIS — I509 Heart failure, unspecified: Secondary | ICD-10-CM

## 2014-01-06 NOTE — Addendum Note (Signed)
Addended by: Marian Sorrow on: 01/06/2014 05:22 PM   Modules accepted: Orders

## 2014-01-06 NOTE — Progress Notes (Signed)
Pre-visit discussion using our clinic review tool. No additional management support is needed unless otherwise documented below in the visit note.  

## 2014-01-06 NOTE — Patient Instructions (Signed)
Limit your sodium (Salt) intake  You need to lose weight.  Consider a lower calorie diet and regular exercise.  Please check your blood pressure on a regular basis.  If it is consistently greater than 150/90, please make an office appointment.  Return in 6 months for follow-up

## 2014-01-06 NOTE — Progress Notes (Signed)
Subjective:    Patient ID: [name], male    DOB: [birth year], 43 y.o   MRN: [id number]  HPI 43 -year-old patient who is seen today for a preventive health examination. He has established with our practice about one year ago. He has been disabled since 2009. Medical problems include peripheral vascular disease. He is status post endovascular repair of a AAA in October of  2012 and has done quite well. He has a history of exogenous obesity hypertension and LVH. Past medical history is remarkable for a history of hypertension and LVH. He has a prior history of DVT and pulmonary embolism. He is followed by nephrology do to chronic kidney disease on an annual basis    Family history father age 25 history of hypertension Mother history of asthma hypertension 3 brothers one sister in good health  Social history married remote tobacco  Past Medical History  Diagnosis Date  . Chronic kidney disease   . History of pulmonary embolus (PE) 2010  . 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 Left ventricular hypertrophy   . Abdominal aortic aneurysm   . Stroke 2009    pt says no stroke  . Hypertension     dr [name]  . Anxiety   . DVT (deep venous thrombosis)   . Obesity   . Chronic renal disease     advanced  . Aortic regurgitation 12/10/08    Echo: mild to mod. AOV regurg,mild AO root dilatation, LA mildly dilated, EF 65%, LV wall thickness markedly increased, small PE    History   Social History  . Marital Status: Married    Spouse Name: N/A    Number of Children: N/A  . Years of Education: N/A   Occupational History  . Not on file.   Social History Main Topics  . Smoking status: Never Smoker   . Smokeless tobacco: Never Used  . Alcohol Use: No  . Drug Use: No  . Sexual Activity: Not Currently    Partners: Female   Other Topics Concern  . Not on file   Social History Narrative  . No narrative on file    Past Surgical History   Procedure Laterality Date  . Cardiac catheterization  02/11/10    false + Nuc  . Corneal transplant    . Intravascular ultrasound  09/25/2012    Procedure: INTRAVASCULAR ULTRASOUND;  Surgeon: Mal [name], MD;  Location: [facility];  Service: Vascular;  Laterality: N/A;  . Abdominal aortic aneurysm repair  09/25/2012    EVAR    Family History  Problem Relation Age of Onset  . Hypertension Mother   . Hypertension Father     No Known Allergies  Current Outpatient Prescriptions on File Prior to Visit  Medication Sig Dispense Refill  . atorvastatin (LIPITOR) 10 MG tablet Take 1 tablet (10 mg total) by mouth daily.  90 tablet  3  . calcitRIOL (ROCALTROL) 0.25 MCG capsule Take 0.25 mcg by mouth every Monday, Wednesday, and Friday.      . carvedilol (COREG) 25 MG tablet TAKE 2 TABLETS TWICE DAILY.  120 tablet  5  . cephALEXin (KEFLEX) 500 MG capsule Take 1 capsule (500 mg total) by mouth 2 (two) times daily.  20 capsule  0  . furosemide (LASIX) 20 MG tablet Take 20 mg by mouth daily as needed for edema.      [name] oxyCODONE-acetaminophen (PERCOCET/ROXICET) 5-325 MG per tablet Take 1 tablet by mouth  every 4 (four) hours as needed for severe pain.      . Rivaroxaban (XARELTO) 20 MG TABS tablet Take 1 tablet (20 mg total) by mouth daily.  90 tablet  4   No current facility-administered medications on file prior to visit.    BP 118/80  Pulse 75  Temp(Src) 98.2 F (36.8 C) (Oral)  Resp 20  Wt 374 lb 14.4 oz (170.054 kg)  SpO2 96%     Review of Systems  Constitutional: Negative for fever, chills, appetite change and fatigue.  HENT: Negative for congestion, dental problem, ear pain, hearing loss, sore throat, tinnitus, trouble swallowing and voice change.   Eyes: Negative for pain, discharge and visual disturbance.  Respiratory: Negative for cough, chest tightness, wheezing and stridor.   Cardiovascular: Negative for chest pain, palpitations and leg swelling.  Gastrointestinal: Negative  for nausea, vomiting, abdominal pain, diarrhea, constipation, blood in stool and abdominal distention.  Genitourinary: Negative for urgency, hematuria, flank pain, discharge, difficulty urinating and genital sores.  Musculoskeletal: Negative for arthralgias, back pain, gait problem, joint swelling, myalgias and neck stiffness.  Skin: Negative for rash.  Neurological: Positive for weakness. Negative for dizziness, syncope, speech difficulty, numbness and headaches.  Hematological: Negative for adenopathy. Does not bruise/bleed easily.  Psychiatric/Behavioral: Negative for behavioral problems and dysphoric mood. The patient is not nervous/anxious.        Objective:   Physical Exam  Constitutional: He appears well-developed and well-nourished.  Morbidly obese Blood pressure 110/70  HENT:  Head: Normocephalic and atraumatic.  Right Ear: External ear normal.  Left Ear: External ear normal.  Nose: Nose normal.  Mouth/Throat: Oropharynx is clear and moist.  Low hanging soft palate with pharyngeal crowding Status post left corneal transplant Right cataract  Eyes: Conjunctivae and EOM are normal. Pupils are equal, round, and reactive to light. No scleral icterus.  Neck: Normal range of motion. Neck supple. No JVD present. No thyromegaly present.  Cardiovascular: Normal rate, regular rhythm, normal heart sounds and intact distal pulses.  Exam reveals no gallop and no friction rub.   No murmur heard. Pedal pulses are intact  Pulmonary/Chest: Effort normal and breath sounds normal. He exhibits no tenderness.  Abdominal: Soft. Bowel sounds are normal. He exhibits no distension and no mass. There is no tenderness.  Genitourinary: Prostate normal and penis normal.  Musculoskeletal: Normal range of motion. He exhibits no edema and no tenderness.  Lymphadenopathy:    He has no cervical adenopathy.  Neurological: He is alert. He has normal reflexes. No cranial nerve deficit. Coordination normal.   Skin: Skin is warm and dry. No rash noted.  Psychiatric: He has a normal mood and affect. His behavior is normal.          Assessment & Plan:  Hypertensive cardiovascular disease Hypertension well controlled. Amlodipine discontinued recently Status post endovascular repair of AAA Chronic kidney disease Morbid obesity OSA suspect. Declines evaluation   Statin therapy will be continued Weight loss encouraged Recheck 6 months Medications refilled

## 2014-01-07 ENCOUNTER — Telehealth: Payer: Self-pay | Admitting: Internal Medicine

## 2014-01-07 NOTE — Telephone Encounter (Signed)
Relevant patient education assigned to patient using Emmi. ° °

## 2014-01-19 ENCOUNTER — Telehealth: Payer: Self-pay | Admitting: Internal Medicine

## 2014-01-19 NOTE — Telephone Encounter (Signed)
Pt states he dropped off forms which you had advised he put in your hand, but pt unable to do so. Would like to confirm that you did receive those papers, and wife thinks she forgot to sign her forms.  Can you check on this for her?  [name]

## 2014-01-19 NOTE — Telephone Encounter (Signed)
Spoke to pt told him I received his paper work and his wife did not sign her form. Pt verbalized understanding and will have wife stop by. Told him okay.

## 2014-01-30 ENCOUNTER — Encounter: Payer: Self-pay | Admitting: Internal Medicine

## 2014-01-30 ENCOUNTER — Ambulatory Visit (INDEPENDENT_AMBULATORY_CARE_PROVIDER_SITE_OTHER): Payer: Managed Care, Other (non HMO) | Admitting: Internal Medicine

## 2014-01-30 ENCOUNTER — Other Ambulatory Visit: Payer: Managed Care, Other (non HMO)

## 2014-01-30 DIAGNOSIS — R609 Edema, unspecified: Secondary | ICD-10-CM

## 2014-01-30 DIAGNOSIS — I10 Essential (primary) hypertension: Secondary | ICD-10-CM

## 2014-01-30 DIAGNOSIS — N289 Disorder of kidney and ureter, unspecified: Secondary | ICD-10-CM

## 2014-01-30 DIAGNOSIS — R600 Localized edema: Secondary | ICD-10-CM

## 2014-01-30 MED ORDER — FUROSEMIDE 20 MG PO TABS: 40.0000 mg | ORAL_TABLET | Freq: Every day | ORAL | Status: DC | PRN

## 2014-01-30 MED ORDER — AMLODIPINE BESY-BENAZEPRIL HCL 5-40 MG PO CAPS: 1.0000 | ORAL_CAPSULE | Freq: Every day | ORAL | Status: DC

## 2014-01-30 MED ORDER — FUROSEMIDE 40 MG PO TABS: 40.0000 mg | ORAL_TABLET | Freq: Every day | ORAL | Status: DC

## 2014-01-30 NOTE — Progress Notes (Signed)
Pre-visit discussion using our clinic review tool. No additional management support is needed unless otherwise documented below in the visit note.  

## 2014-01-30 NOTE — Patient Instructions (Signed)
Limit your sodium (Salt) intake  Please check your blood pressure on a regular basis.  If it is consistently greater than 150/90, please make an office appointment.  You need to lose weight.  Consider a lower calorie diet and regular exercise.  Return in 3 months for follow-up

## 2014-01-30 NOTE — Progress Notes (Signed)
Subjective:    Patient ID: [name], male    DOB: October 09, 1971, 43 y.o.   MRN: [id number]  HPI  43 year old patient who has treated hypertension. This has been treated with Tribenzor 40-10-25 in the past but was not covered well by his insurance. He was seen about one month ago and treated for a cellulitis involving the dorsal aspect of his right foot. This was associated with the worsening pedal edema. Amlodipine was discontinued at that time. His peripheral edema has improved. His blood pressure has increased today somewhat.  Past Medical History  Diagnosis Date  . Chronic kidney disease   . History of pulmonary embolus (PE) 2010  . 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 Left ventricular hypertrophy   . Abdominal aortic aneurysm   . Stroke 2009    pt says no stroke  . Hypertension     dr [name]  . Anxiety   . DVT (deep venous thrombosis)   . Obesity   . Chronic renal disease     advanced  . Aortic regurgitation 12/10/08    Echo: mild to mod. AOV regurg,mild AO root dilatation, LA mildly dilated, EF 65%, LV wall thickness markedly increased, small PE    History   Social History  . Marital Status: Married    Spouse Name: N/A    Number of Children: N/A  . Years of Education: N/A   Occupational History  . Not on file.   Social History Main Topics  . Smoking status: Never Smoker   . Smokeless tobacco: Never Used  . Alcohol Use: No  . Drug Use: No  . Sexual Activity: Not Currently    Partners: Female   Other Topics Concern  . Not on file   Social History Narrative  . No narrative on file    Past Surgical History  Procedure Laterality Date  . Cardiac catheterization  02/11/10    false + Nuc  . Corneal transplant    . Intravascular ultrasound  09/25/2012    Procedure: INTRAVASCULAR ULTRASOUND;  Surgeon: Mal [name], MD;  Location: [place];  Service: Vascular;  Laterality: N/A;  . Abdominal aortic aneurysm repair   09/25/2012    EVAR    Family History  Problem Relation Age of Onset  . Hypertension Mother   . Hypertension Father     No Known Allergies  Current Outpatient Prescriptions on File Prior to Visit  Medication Sig Dispense Refill  . atorvastatin (LIPITOR) 10 MG tablet Take 1 tablet (10 mg total) by mouth daily.  90 tablet  3  . calcitRIOL (ROCALTROL) 0.25 MCG capsule Take 0.25 mcg by mouth every Monday, Wednesday, and Friday.      . carvedilol (COREG) 25 MG tablet TAKE 2 TABLETS TWICE DAILY.  120 tablet  5  . furosemide (LASIX) 20 MG tablet Take 20 mg by mouth daily as needed for edema.      [name] olmesartan-hydrochlorothiazide (BENICAR HCT) 40-25 MG per tablet Take 1 tablet by mouth daily.      . Rivaroxaban (XARELTO) 20 MG TABS tablet Take 1 tablet (20 mg total) by mouth daily.  90 tablet  4   No current facility-administered medications on file prior to visit.    BP 140/98  Pulse 63  Temp(Src) 98.2 F (36.8 C) (Oral)  Resp 20  Ht 6' (1.829 m)  Wt 376 lb 12.8 oz (170.915 kg)  BMI 51.09 kg/m2  SpO2 97%  Review of Systems  Cardiovascular: Positive for leg swelling.       Objective:   Physical Exam  Constitutional: He appears well-developed and well-nourished. No distress.  Blood pressure 140/90 on arrival Weight 376 Repeat blood pressure 130/90  Musculoskeletal:  No significant pedal edema Mild postinflammatory scaliness over the dorsal aspect of the right foot from prior cellulitis          Assessment & Plan:   Hypertension Chronic kidney disease  We'll change antihypertensive regimen to  Lasix 40 and Lotrel 5-40 once daily Low salt diet recommended Home blood pressure monitor and recommended  Hopeful will tolerate lower amlodipine dose with improved blood pressure control

## 2014-02-03 ENCOUNTER — Telehealth: Payer: Self-pay | Admitting: *Deleted

## 2014-02-03 NOTE — Telephone Encounter (Signed)
Pt called and left message on my voicemail . Called pt back had question about Lasix what dose is he suppose to be on. Told pt according to note at last visit Dr. Raliegh Ip changed Lasix to 40 mg one tablet daily. Pt verbalized understanding.

## 2014-02-05 ENCOUNTER — Encounter: Payer: Managed Care, Other (non HMO) | Admitting: Internal Medicine

## 2014-02-13 ENCOUNTER — Other Ambulatory Visit: Payer: Self-pay | Admitting: Internal Medicine

## 2014-03-19 ENCOUNTER — Ambulatory Visit: Payer: Managed Care, Other (non HMO) | Admitting: Internal Medicine

## 2014-04-01 ENCOUNTER — Other Ambulatory Visit: Payer: Self-pay | Admitting: Internal Medicine

## 2014-04-02 DIAGNOSIS — Z0279 Encounter for issue of other medical certificate: Secondary | ICD-10-CM

## 2014-04-28 ENCOUNTER — Telehealth: Payer: Self-pay | Admitting: Cardiovascular Disease

## 2014-04-28 NOTE — Telephone Encounter (Signed)
Returned call and pt verified x 2.  Pt put Levada Dy, wife, on phone.  Stated pt was bleeding from his scalp yesterday and they think it's from the [place].  Denied any injury from brushing hair or anything.  Also stated pt has been taking Xarelto consistently w/o missing any doses.  Stated they were sitting on the couch and she looked over to see his scalp was bleeding.  Stated pt is fine and looks fine.  Also stated pt cut his hair today and did not have any bleeding.  No symptoms, except some bleeding at night in his mouth.  Denied pt clenches teeth at night and stated pt has bleeding "almost every night."  RN asked about pt's BP and wife stated it has not been checked in a while and may be elevated.  RN informed last OV at PCP's office pt's BP was slightly elevated.  Advised they monitor BP and monitor bleeding.  Informed pharmacist will be notified to review symptoms and advise since pt is having bleeding in his mouth almost every night.  They will be notified of response.  Wife verbalized understanding and agreed w/ plan.  Message forwarded to Tommy Medal, PharmD.

## 2014-04-28 NOTE — Telephone Encounter (Signed)
Taking Xarelto ,  Yesterday his scalp was bleeding.  Now a spot there.Wants to talk to a nurse. Please call

## 2014-04-29 MED ORDER — RIVAROXABAN 15 MG PO TABS: 15.0000 mg | ORAL_TABLET | Freq: Every day | ORAL | Status: DC

## 2014-04-29 NOTE — Telephone Encounter (Signed)
Pt has CrCl of 35.5.  Dose should be 15mg .  Of note, pt weight is 170kg, medication info states no change in clearance of med in pts up to 190kg.  Wife states still having bleeding from mouth overnight as well as some bleeding and bruising on scalp.  Will call rx to [place].  Wife to call back in 2 weeks to let us know if no improvement

## 2014-05-15 ENCOUNTER — Other Ambulatory Visit: Payer: Self-pay | Admitting: Internal Medicine

## 2014-06-16 ENCOUNTER — Other Ambulatory Visit: Payer: Self-pay | Admitting: Internal Medicine

## 2014-06-24 ENCOUNTER — Other Ambulatory Visit: Payer: Self-pay | Admitting: Cardiovascular Disease

## 2014-07-06 ENCOUNTER — Ambulatory Visit: Payer: Managed Care, Other (non HMO) | Admitting: Internal Medicine

## 2014-07-09 ENCOUNTER — Ambulatory Visit (INDEPENDENT_AMBULATORY_CARE_PROVIDER_SITE_OTHER): Payer: Managed Care, Other (non HMO) | Admitting: Internal Medicine

## 2014-07-09 ENCOUNTER — Encounter: Payer: Self-pay | Admitting: Internal Medicine

## 2014-07-09 VITALS — BP 150/102 | HR 76 | Temp 98.0°F | Resp 20 | Ht 72.0 in | Wt 382.0 lb

## 2014-07-09 DIAGNOSIS — N289 Disorder of kidney and ureter, unspecified: Secondary | ICD-10-CM

## 2014-07-09 DIAGNOSIS — R609 Edema, unspecified: Secondary | ICD-10-CM

## 2014-07-09 DIAGNOSIS — R600 Localized edema: Secondary | ICD-10-CM

## 2014-07-09 DIAGNOSIS — I10 Essential (primary) hypertension: Secondary | ICD-10-CM

## 2014-07-09 MED ORDER — CLONIDINE HCL 0.1 MG PO TABS: ORAL_TABLET | ORAL | Status: DC

## 2014-07-09 MED ORDER — CHLORTHALIDONE 25 MG PO TABS: 25.0000 mg | ORAL_TABLET | Freq: Every day | ORAL | Status: DC

## 2014-07-09 NOTE — Progress Notes (Signed)
Pre visit review using our clinic review tool, if applicable. No additional management support is needed unless otherwise documented below in the visit note. 

## 2014-07-09 NOTE — Patient Instructions (Signed)
Limit your sodium (Salt) intake  Please check your blood pressure on a regular basis.  If it is consistently greater than 150/90, please make an office appointment.  Return in 2 weeks for followup

## 2014-07-09 NOTE — Progress Notes (Signed)
Subjective:    Patient ID: [name], male    DOB: 26-Aug-1971, 43 y.o.   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  He remains on chronic anticoagulation therapy.  He has a history of diastolic heart failure and chronic kidney disease. He takes furosemide infrequently.  More recently, he has had headaches and blood pressure has trended up.  Past Medical History  Diagnosis Date  . Chronic kidney disease   . History of pulmonary embolus (PE) 2010  . 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 Left ventricular hypertrophy   . Abdominal aortic aneurysm   . Stroke 2009    pt says no stroke  . Hypertension     dr [name]  . Anxiety   . DVT (deep venous thrombosis)   . Obesity   . Chronic renal disease     advanced  . Aortic regurgitation 12/10/08    Echo: mild to mod. AOV regurg,mild AO root dilatation, LA mildly dilated, EF 65%, LV wall thickness markedly increased, small PE    History   Social History  . Marital Status: Married    Spouse Name: N/A    Number of Children: N/A  . Years of Education: N/A   Occupational History  . Not on file.   Social History Main Topics  . Smoking status: Never Smoker   . Smokeless tobacco: Never Used  . Alcohol Use: No  . Drug Use: No  . Sexual Activity: Not Currently    Partners: Female   Other Topics Concern  . Not on file   Social History Narrative  . No narrative on file    Past Surgical History  Procedure Laterality Date  . Cardiac catheterization  02/11/10    false + Nuc  . Corneal transplant    . Intravascular ultrasound  09/25/2012    Procedure: INTRAVASCULAR ULTRASOUND;  Surgeon: Mal [name], MD;  Location: [place];  Service: Vascular;  Laterality: N/A;  . Abdominal aortic aneurysm repair  09/25/2012    EVAR    Family History  Problem Relation Age of Onset  . Hypertension Mother   .  Hypertension Father     No Known Allergies  Current Outpatient Prescriptions on File Prior to Visit  Medication Sig Dispense Refill  . amLODipine-benazepril (LOTREL) 5-40 MG per capsule Take 1 capsule by mouth daily.  90 capsule  4  . atorvastatin (LIPITOR) 10 MG tablet TAKE 1 TABLET (10 MG TOTAL) BY MOUTH DAILY.  90 tablet  3  . calcitRIOL (ROCALTROL) 0.25 MCG capsule Take 0.25 mcg by mouth every Monday, Wednesday, and Friday.      . carvedilol (COREG) 25 MG tablet TAKE 2 TABLETS TWICE DAILY.  120 tablet  5  . furosemide (LASIX) 40 MG tablet Take 1 tablet (40 mg total) by mouth daily.  30 tablet  3  . XARELTO 15 MG TABS tablet TAKE 1 TABLET (15 MG TOTAL) BY MOUTH DAILY WITH SUPPER.  30 tablet  5   No current facility-administered medications on file prior to visit.    BP 150/102  Pulse 76  Temp(Src) 98 F (36.7 C) (Oral)  Resp 20  Ht 6' (1.829 m)  Wt 382 lb (173.274 kg)  BMI 51.80 kg/m2  SpO2 97%     Review of Systems  Constitutional: Negative for fever, chills, appetite change and fatigue.  HENT: Negative for  congestion, dental problem, ear pain, hearing loss, sore throat, tinnitus, trouble swallowing and voice change.   Eyes: Negative for pain, discharge and visual disturbance.  Respiratory: Negative for cough, chest tightness, wheezing and stridor.   Cardiovascular: Positive for leg swelling. Negative for chest pain and palpitations.  Gastrointestinal: Negative for nausea, vomiting, abdominal pain, diarrhea, constipation, blood in stool and abdominal distention.  Genitourinary: Negative for urgency, hematuria, flank pain, discharge, difficulty urinating and genital sores.  Musculoskeletal: Negative for arthralgias, back pain, gait problem, joint swelling, myalgias and neck stiffness.  Skin: Negative for rash.  Neurological: Positive for headaches. Negative for dizziness, syncope, speech difficulty, weakness and numbness.  Hematological: Negative for adenopathy. Does not  bruise/bleed easily.  Psychiatric/Behavioral: Negative for behavioral problems and dysphoric mood. The patient is not nervous/anxious.        Objective:   Physical Exam  Constitutional: He is oriented to person, place, and time. He appears well-developed.  Blood pressure 150/100  HENT:  Head: Normocephalic.  Right Ear: External ear normal.  Left Ear: External ear normal.  Eyes: Conjunctivae and EOM are normal.  Neck: Normal range of motion.  Cardiovascular: Normal rate and normal heart sounds.   Pulmonary/Chest: Breath sounds normal.  Abdominal: Bowel sounds are normal.  Musculoskeletal: Normal range of motion. He exhibits edema. He exhibits no tenderness.  Prominent lower extremity edema, right greater than the left  Neurological: He is alert and oriented to person, place, and time.  Psychiatric: He has a normal mood and affect. His behavior is normal.          Assessment & Plan:   Hypertension, suboptimal control.  We'll  Encouraged daily, furosemide use and add a bedtime dose of clonidine.  Will recheck in 2-3 weeks  Chronic kidney disease.  Followup nephrology Chronic anticoagulation Morbid obesity  Low salt diet recommended

## 2014-07-27 ENCOUNTER — Ambulatory Visit (INDEPENDENT_AMBULATORY_CARE_PROVIDER_SITE_OTHER): Payer: Managed Care, Other (non HMO) | Admitting: Internal Medicine

## 2014-07-27 ENCOUNTER — Encounter: Payer: Self-pay | Admitting: Internal Medicine

## 2014-07-27 DIAGNOSIS — N289 Disorder of kidney and ureter, unspecified: Secondary | ICD-10-CM

## 2014-07-27 DIAGNOSIS — I10 Essential (primary) hypertension: Secondary | ICD-10-CM

## 2014-07-27 NOTE — Patient Instructions (Signed)
Limit your sodium (Salt) intake   You need to lose weight.  Consider a lower calorie diet and regular exercise. 

## 2014-07-27 NOTE — Progress Notes (Signed)
Pre visit review using our clinic review tool, if applicable. No additional management support is needed unless otherwise documented below in the visit note. 

## 2014-07-27 NOTE — Progress Notes (Signed)
Subjective:    Patient ID: [name], male    DOB: 04-29-1971, 43 y.o.   MRN: [id number]  HPI  43 year old patient who is in today for followup of hypertension.  He has a history of morbid obesity, chronic kidney disease, and was seen 2 weeks ago with elevated blood pressure and worsening lower extremity edema.  He had been taking furosemide sporadically, but over the past 2 weeks has been using daily.  He is scheduled for nephrology followup and followup lab next week.  He feels well on this regimen.  He is also on a bedtime dose of clonidine 0 point 1  Past Medical History  Diagnosis Date  . Chronic kidney disease   . History of pulmonary embolus (PE) 2010  . 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 Left ventricular hypertrophy   . Abdominal aortic aneurysm   . Stroke 2009    pt says no stroke  . Hypertension     dr [name]  . Anxiety   . DVT (deep venous thrombosis)   . Obesity   . Chronic renal disease     advanced  . Aortic regurgitation 12/10/08    Echo: mild to mod. AOV regurg,mild AO root dilatation, LA mildly dilated, EF 65%, LV wall thickness markedly increased, small PE    History   Social History  . Marital Status: Married    Spouse Name: N/A    Number of Children: N/A  . Years of Education: N/A   Occupational History  . Not on file.   Social History Main Topics  . Smoking status: Never Smoker   . Smokeless tobacco: Never Used  . Alcohol Use: No  . Drug Use: No  . Sexual Activity: Not Currently    Partners: Female   Other Topics Concern  . Not on file   Social History Narrative  . No narrative on file    Past Surgical History  Procedure Laterality Date  . Cardiac catheterization  02/11/10    false + Nuc  . Corneal transplant    . Intravascular ultrasound  09/25/2012    Procedure: INTRAVASCULAR ULTRASOUND;  Surgeon: Mal [name], MD;  Location: [place];  Service: Vascular;  Laterality: N/A;  . Abdominal  aortic aneurysm repair  09/25/2012    EVAR    Family History  Problem Relation Age of Onset  . Hypertension Mother   . Hypertension Father     No Known Allergies  Current Outpatient Prescriptions on File Prior to Visit  Medication Sig Dispense Refill  . amLODipine-benazepril (LOTREL) 5-40 MG per capsule Take 1 capsule by mouth daily.  90 capsule  4  . atorvastatin (LIPITOR) 10 MG tablet TAKE 1 TABLET (10 MG TOTAL) BY MOUTH DAILY.  90 tablet  3  . calcitRIOL (ROCALTROL) 0.25 MCG capsule Take 0.25 mcg by mouth every Monday, Wednesday, and Friday.      . carvedilol (COREG) 25 MG tablet TAKE 2 TABLETS TWICE DAILY.  120 tablet  5  . cloNIDine (CATAPRES) 0.1 MG tablet 1 daily at bedtime  90 tablet  3  . furosemide (LASIX) 40 MG tablet Take 1 tablet (40 mg total) by mouth daily.  30 tablet  3  . XARELTO 15 MG TABS tablet TAKE 1 TABLET (15 MG TOTAL) BY MOUTH DAILY WITH SUPPER.  30 tablet  5   No current facility-administered medications on file prior to visit.    BP 126/90  Pulse 73  Resp 20  Ht 6' (1.829 m)  Wt 382 lb (173.274 kg)  BMI 51.80 kg/m2  SpO2 96%      Review of Systems  Constitutional: Negative for fever, chills, appetite change and fatigue.  HENT: Negative for congestion, dental problem, ear pain, hearing loss, sore throat, tinnitus, trouble swallowing and voice change.   Eyes: Negative for pain, discharge and visual disturbance.  Respiratory: Negative for cough, chest tightness, wheezing and stridor.   Cardiovascular: Positive for leg swelling. Negative for chest pain and palpitations.  Gastrointestinal: Negative for nausea, vomiting, abdominal pain, diarrhea, constipation, blood in stool and abdominal distention.  Genitourinary: Negative for urgency, hematuria, flank pain, discharge, difficulty urinating and genital sores.  Musculoskeletal: Negative for arthralgias, back pain, gait problem, joint swelling, myalgias and neck stiffness.  Skin: Negative for rash.    Neurological: Negative for dizziness, syncope, speech difficulty, weakness, numbness and headaches.  Hematological: Negative for adenopathy. Does not bruise/bleed easily.  Psychiatric/Behavioral: Negative for behavioral problems and dysphoric mood. The patient is not nervous/anxious.        Objective:   Physical Exam  Constitutional:  Morbidly obese   Blood pressure 120/90 in both arms  Pulmonary/Chest: Effort normal and breath sounds normal. He has no rales.  Musculoskeletal: He exhibits edema.  Right> left lower extremity edema-improved          Assessment & Plan:   Hypertension improved Chronic kidney disease.  Followup pathology with lab next week as scheduled  morbid obesity.  Weight loss encouraged  Recheck 3 months

## 2014-08-19 ENCOUNTER — Telehealth: Payer: Self-pay | Admitting: Cardiovascular Disease

## 2014-08-19 DIAGNOSIS — N189 Chronic kidney disease, unspecified: Secondary | ICD-10-CM

## 2014-08-19 NOTE — Telephone Encounter (Signed)
Please call,his kidney medicine seems to be interfering with his Xarelto. He is bleeding from his mouth a lot.

## 2014-08-19 NOTE — Telephone Encounter (Signed)
Reviewed with Dr. Sallyanne Kuster, will have patient hold dose of Xarelto x 1 today.  He is to get BMET tomorrow morning to see if kidney function worsening.  Pt voiced understanding.

## 2014-08-19 NOTE — Telephone Encounter (Signed)
Patient's wife called in. She reports that patient is bleeding profusely (last night and once last week), about a gallon. He has had gum bleeding, but it got worse when Dr. [name] made med changes.   Patient did not describe symptoms to be as severe and wife is describing.   Last Hgb in EPIC was scanned in from another office - Hgb 12.2 on 8/11

## 2014-08-19 NOTE — Telephone Encounter (Signed)
Returned call to patient.   On 8/11 Dr [name] made med changes..  1. STOPPED carvedilol/clonidine/amlodipine-benazepril  2. STARTED lisinopril 20mg  once daily, labetalol 200mg  BID, amlodipine 10mg  once daily   He reports after these med changes he had really bad itching but is now better and limited to only scalp region. He notes he called Dr. Jason Nest office and made them aware.   He also reports since med changes, he has had 3-4 episodes of bleeding from gums during night, describing the bleeding as "clots" about the size of a 50 cent piece, and he is spitting blood.   He reports having light bleeding from mouth previously but this is worse.   He does not check BP or HR, but thinks they are OK  He has not other cardiac complaints.   Informed patient that Erasmo Downer, PharmD would be notified for advice and he will be contacted accordingly.

## 2014-08-20 LAB — BASIC METABOLIC PANEL
BUN: 22 mg/dL (ref 6–23)
CALCIUM: 9.3 mg/dL (ref 8.4–10.5)
CO2: 26 mEq/L (ref 19–32)
Chloride: 107 mEq/L (ref 96–112)
Creat: 2.11 mg/dL — ABNORMAL HIGH (ref 0.50–1.35)
Glucose, Bld: 109 mg/dL — ABNORMAL HIGH (ref 70–99)
Potassium: 4.2 mEq/L (ref 3.5–5.3)
SODIUM: 139 meq/L (ref 135–145)

## 2014-08-21 ENCOUNTER — Telehealth: Payer: Self-pay | Admitting: Cardiovascular Disease

## 2014-08-21 NOTE — Telephone Encounter (Signed)
Dr. Loletha Grayer reviewed labs, advised to continue all meds.  Spoke with patient, no bleeding gums since stopped.  Will restart tonight, pt to call if problems arise.  Voiced understanding

## 2014-08-21 NOTE — Telephone Encounter (Signed)
Pt need his lab results and need to know what to do about his Xarelto please.

## 2014-08-26 ENCOUNTER — Telehealth: Payer: Self-pay | Admitting: Cardiovascular Disease

## 2014-08-26 NOTE — Telephone Encounter (Signed)
Daughter called stated pt. Was bleeding last night until 3:30 am; it has stopped right now  But wanted Kelle Darting to call them ; wants to know if he should keep taking Xarelto

## 2014-08-26 NOTE — Telephone Encounter (Signed)
Pt started back last night bleeding from Xarelt,he bleed so much. Please call to advise,he does not know what to do.t

## 2014-08-28 NOTE — Telephone Encounter (Signed)
Spoke with patient, he still believes problem related to switched meds by nephrologist about 2 weeks ago.  Woke up again last night with blood in his mouth.    Explained to patient that those changes should not have caused any problems, asked about dental hygiene.  Stated does not go for dental cleaning because it's not recommended while on Xarelto.  I explained that much of gum bleeding is due to dental hygiene and that he should have regular cleanings.  In the meantime, I suggested we switch to Eliquis, which has a lower bleeding risk than warfarin, to see if this would help.  I will supply him with samples for 2 weeks.  He is due to see nephrologist on Tuesday 9/8, suggested he discuss medication changes with him.  Explained that if still has problems with Eliquis, we will have to consider warfarin.  Pt against warfarin, but understands.  He wishes to speak to nephrologist before making any further decisions.

## 2014-09-04 ENCOUNTER — Encounter: Payer: Self-pay | Admitting: Physician Assistant

## 2014-09-04 ENCOUNTER — Ambulatory Visit (INDEPENDENT_AMBULATORY_CARE_PROVIDER_SITE_OTHER): Payer: Managed Care, Other (non HMO) | Admitting: Physician Assistant

## 2014-09-04 VITALS — BP 120/90 | HR 96 | Temp 97.9°F | Resp 18 | Wt 377.4 lb

## 2014-09-04 DIAGNOSIS — J069 Acute upper respiratory infection, unspecified: Secondary | ICD-10-CM

## 2014-09-04 MED ORDER — HYDROCODONE-HOMATROPINE 5-1.5 MG/5ML PO SYRP: 5.0000 mL | ORAL_SOLUTION | Freq: Three times a day (TID) | ORAL | Status: DC | PRN

## 2014-09-04 NOTE — Progress Notes (Signed)
Pre visit review using our clinic review tool, if applicable. No additional management support is needed unless otherwise documented below in the visit note. 

## 2014-09-04 NOTE — Patient Instructions (Addendum)
Hycodan every 8 hours as needed or at night for cough symptoms. No driving while taking this medication as it will inhibit your ability to operate a motor vehicle.  Plain Over the Counter Mucinex (NOT Mucinex D) for thick secretions  Force NON dairy fluids, drinking plenty of water is best.    Over the Counter Flonase OR Nasacort AQ 1 spray in each nostril twice a day as needed. Use the "crossover" technique into opposite nostril spraying toward opposite ear @ 45 degree angle, not straight up into nostril.   Plain Over the Counter Allegra (NOT D )  160 daily , OR Loratidine 10 mg , OR Zyrtec 10 mg @ bedtime  as needed for itchy eyes & sneezing.  Saline Irrigation and Saline Sprays can also help reduce symptoms.  If emergency symptoms discussed during visit developed, seek medical attention immediately.  Followup as needed, or for worsening or persistent symptoms despite treatment.     Upper Respiratory Infection, Adult An upper respiratory infection (URI) is also known as the common cold. It is often caused by a type of germ (virus). Colds are easily spread (contagious). You can pass it to others by kissing, coughing, sneezing, or drinking out of the same glass. Usually, you get better in 1 or 2 weeks.  HOME CARE   Only take medicine as told by your doctor.  Use a warm mist humidifier or breathe in steam from a hot shower.  Drink enough water and fluids to keep your pee (urine) clear or pale yellow.  Get plenty of rest.  Return to work when your temperature is back to normal or as told by your doctor. You may use a face mask and wash your hands to stop your cold from spreading. GET HELP RIGHT AWAY IF:   After the first few days, you feel you are getting worse.  You have questions about your medicine.  You have chills, shortness of breath, or brown or red spit (mucus).  You have yellow or brown snot (nasal discharge) or pain in the face, especially when you bend forward.  You  have a fever, puffy (swollen) neck, pain when you swallow, or white spots in the back of your throat.  You have a bad headache, ear pain, sinus pain, or chest pain.  You have a high-pitched whistling sound when you breathe in and out (wheezing).  You have a lasting cough or cough up blood.  You have sore muscles or a stiff neck. MAKE SURE YOU:   Understand these instructions.  Will watch your condition.  Will get help right away if you are not doing well or get worse. Document Released: 05/29/2008 Document Revised: 03/04/2012 Document Reviewed: 03/18/2014 [facility] Patient Information 2015 [place], [place]. This information is not intended to replace advice given to you by your health care provider. Make sure you discuss any questions you have with your health care provider.

## 2014-09-04 NOTE — Progress Notes (Signed)
Subjective:    Patient ID: [name], male    DOB: Sep 15, 1971, 43 y.o.   MRN: [id number]  Cough This is a new problem. The current episode started in the past 7 days (4 days). The problem has been gradually improving. The problem occurs hourly. The cough is productive of sputum. Associated symptoms include chills, a fever, headaches, nasal congestion, postnasal drip and a sore throat (resolved). Pertinent negatives include no chest pain, ear congestion, ear pain, heartburn, hemoptysis, myalgias, rash, rhinorrhea, shortness of breath, sweats, weight loss or wheezing. The symptoms are aggravated by lying down. Treatments tried: tylenol and benadryl. The treatment provided mild relief. His past medical history is significant for environmental allergies. There is no history of asthma or COPD.      Review of Systems  Constitutional: Positive for fever and chills. Negative for weight loss.  HENT: Positive for postnasal drip and sore throat (resolved). Negative for ear pain and rhinorrhea.   Respiratory: Positive for cough. Negative for hemoptysis, shortness of breath and wheezing.   Cardiovascular: Negative for chest pain.  Gastrointestinal: Negative for heartburn, nausea, vomiting and diarrhea.  Musculoskeletal: Negative for myalgias.  Skin: Negative for rash.  Allergic/Immunologic: Positive for environmental allergies.  Neurological: Positive for headaches.  All other systems reviewed and are negative.    Past Medical History  Diagnosis Date  . Chronic kidney disease   . History of pulmonary embolus (PE) 2010  . 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 Left ventricular hypertrophy   . Abdominal aortic aneurysm   . Stroke 2009    pt says no stroke  . Hypertension     dr [name]  . Anxiety   . DVT (deep venous thrombosis)   . Obesity   . Chronic renal disease     advanced  . Aortic regurgitation 12/10/08    Echo: mild to mod. AOV regurg,mild  AO root dilatation, LA mildly dilated, EF 65%, LV wall thickness markedly increased, small PE    History   Social History  . Marital Status: Married    Spouse Name: N/A    Number of Children: N/A  . Years of Education: N/A   Occupational History  . Not on file.   Social History Main Topics  . Smoking status: Never Smoker   . Smokeless tobacco: Never Used  . Alcohol Use: No  . Drug Use: No  . Sexual Activity: Not Currently    Partners: Female   Other Topics Concern  . Not on file   Social History Narrative  . No narrative on file    Past Surgical History  Procedure Laterality Date  . Cardiac catheterization  02/11/10    false + Nuc  . Corneal transplant    . Intravascular ultrasound  09/25/2012    Procedure: INTRAVASCULAR ULTRASOUND;  Surgeon: Mal [name], MD;  Location: [place];  Service: Vascular;  Laterality: N/A;  . Abdominal aortic aneurysm repair  09/25/2012    EVAR    Family History  Problem Relation Age of Onset  . Hypertension Mother   . Hypertension Father     No Known Allergies  Current Outpatient Prescriptions on File Prior to Visit  Medication Sig Dispense Refill  . atorvastatin (LIPITOR) 10 MG tablet TAKE 1 TABLET (10 MG TOTAL) BY MOUTH DAILY.  90 tablet  3  . calcitRIOL (ROCALTROL) 0.25 MCG capsule Take 0.25 mcg by mouth every Monday, Wednesday, and Friday.      [name]  furosemide (LASIX) 40 MG tablet Take 1 tablet (40 mg total) by mouth daily.  30 tablet  3   No current facility-administered medications on file prior to visit.    EXAM: BP 120/90  Pulse 96  Temp(Src) 97.9 F (36.6 C) (Oral)  Resp 18  Wt 377 lb 6.4 oz (171.188 kg)  SpO2 97%     Objective:   Physical Exam  Nursing note and vitals reviewed. Constitutional: He is oriented to person, place, and time. He appears well-developed and well-nourished. No distress.  HENT:  Head: Normocephalic and atraumatic.  Right Ear: External ear normal.  Left Ear: External ear normal.  Nose:  Nose normal.  Mouth/Throat: No oropharyngeal exudate.  Oropharynx is slightly erythematous, no exudate. Bilateral TMs normal. Bilateral frontal and maxillary sinuses non-TTP.  Eyes: Conjunctivae and EOM are normal.  Neck: Normal range of motion. Neck supple.  Cardiovascular: Normal rate, regular rhythm and intact distal pulses.   Pulmonary/Chest: Effort normal and breath sounds normal. No stridor. No respiratory distress. He has no wheezes. He has no rales. He exhibits no tenderness.  Lymphadenopathy:    He has no cervical adenopathy.  Neurological: He is alert and oriented to person, place, and time.  Skin: Skin is warm and dry. He is not diaphoretic. No pallor.  Psychiatric: He has a normal mood and affect. His behavior is normal. Judgment and thought content normal.     Lab Results  Component Value Date   WBC 4.2 09/15/2013   HGB 12.0* 09/15/2013   HCT 35.8* 09/15/2013   PLT 245 09/15/2013   GLUCOSE 109* 08/20/2014   CHOL 140 02/29/2012   TRIG 72 02/29/2012   HDL 42 02/29/2012   LDLCALC 84 02/29/2012   ALT 12 09/15/2013   AST 15 09/15/2013   NA 139 08/20/2014   K 4.2 08/20/2014   CL 107 08/20/2014   CREATININE 2.11* 08/20/2014   BUN 22 08/20/2014   CO2 26 08/20/2014   TSH 2.424 Test methodology is 3rd generation TSH 12/13/2008   PSA 0.53 Test Methodology: [employer] PSA 05/08/2009   INR 1.23 08/31/2013        Assessment & Plan:  [name] was seen today for cough.  Diagnoses and associated orders for this visit:  Acute upper respiratory infections of unspecified site Comments: symptomatic treatment with Hycodan, over-the-counter Mucinex,  nasal steroid, antihistamine, rest, push fluids, watchful waiting. - HYDROcodone-homatropine (HYCODAN) 5-1.5 MG/5ML syrup; Take 5 mLs by mouth every 8 (eight) hours as needed for cough (No driving while taking as it will inhibit your ability to operate a motor vehicle.).     Return precautions provided, and patient handout on URI.  Plan to follow up  as needed, or for worsening or persistent symptoms despite treatment.  Patient Instructions  Hycodan every 8 hours as needed or at night for cough symptoms. No driving while taking this medication as it will inhibit your ability to operate a motor vehicle.  Plain Over the Counter Mucinex (NOT Mucinex D) for thick secretions  Force NON dairy fluids, drinking plenty of water is best.    Over the Counter Flonase OR Nasacort AQ 1 spray in each nostril twice a day as needed. Use the "crossover" technique into opposite nostril spraying toward opposite ear @ 45 degree angle, not straight up into nostril.   Plain Over the Counter Allegra (NOT D )  160 daily , OR Loratidine 10 mg , OR Zyrtec 10 mg @ bedtime  as needed for itchy eyes & sneezing.  Saline Irrigation and Saline Sprays can also help reduce symptoms.  If emergency symptoms discussed during visit developed, seek medical attention immediately.  Followup as needed, or for worsening or persistent symptoms despite treatment.

## 2014-09-08 ENCOUNTER — Telehealth: Payer: Self-pay | Admitting: Cardiovascular Disease

## 2014-09-08 MED ORDER — APIXABAN 2.5 MG PO TABS: 2.5000 mg | ORAL_TABLET | Freq: Two times a day (BID) | ORAL | Status: DC

## 2014-09-08 NOTE — Telephone Encounter (Signed)
Pt called in wanting some samples of Eliquis 2.5. Please call back   Thanks

## 2014-09-08 NOTE — Telephone Encounter (Signed)
Called patient and let him know we are currently out of samples but I could send in a Rx to his pharmacy. Patient voiced understanding and confirmed pharmacy. Rx was sent to patient pharmacy

## 2014-09-09 ENCOUNTER — Telehealth: Payer: Self-pay | Admitting: Internal Medicine

## 2014-09-09 ENCOUNTER — Telehealth: Payer: Self-pay | Admitting: *Deleted

## 2014-09-09 NOTE — Telephone Encounter (Signed)
Prior Auth done for Eliquis 2.5mg  bid to [facility].  Approved through 03/10/2015.  [id number].

## 2014-09-09 NOTE — Telephone Encounter (Signed)
Pt request samples of apixaban (ELIQUIS) 2.5 MG TABS tablet.

## 2014-09-10 NOTE — Telephone Encounter (Signed)
Left message on voicemail to call office. We do not have samples anymore.

## 2014-09-10 NOTE — Telephone Encounter (Signed)
------------------------------------------------------------------------------------------------------------------------------------------------------------------------------------------------------------------------------------------------------------------------------------------------------------------------------------------------------------------------------------------------------------------------------------------------------------------------------------------------------------------------------------------------------------------------------------------------------------------------------------------------------------------------------------------------------------------------------------------------------------------------------------------------------------------------------------------------------------------------------------------------------------------------------------------------------------------------------------------------------------------------------------------------------------------------------------------------------------------------------------------------------------------------------------------------------------------------------------------------------------------------------------------------------------------------------------------------------------------------------------------------------------------------------------------------------------------------------------------------------------------------------------------------------------------------------------------------------------------------------------------------------------------------------------------------------------------------------------------------------------------------------------------------------------------------------------------------------------------------------------------------------------------------------------------------------------------------------------------------------------------------------ ----------------------------------------------------------------------------------------------------------------------------------                                                                                                                                                                                                                                                                                                                                                                                                                                                                       + + + + + + + + + + + + + + + + + + + + + + + + + + + + + + + + + + + + + + + + + + + + + + + + + + + + + + + + + + + + + + + + + + + + + + + + + + + + + + + + + + + + + + + + + + + + + + + + + + + + + + + + + + + + + + + + + + + + + + + + + + + + + + + + + + + + + + + + + + + + + + + + + + + + + + + + + + + + + + + + + + + + + + + + + + + + + + + + + + + + + + + + + + + + + + + + + + + + + + + + + + + + + + + + + + + + + + + + + + + + + + + + + + + + + + + + + + + + + + + + + + + + + + + + + + + + + + + + + + + + + + + + + + + + + + + + + + + + + + + + + + + + + + + + + + + + + + + + + + + + + + + +  + + + + + + + + + + + + + + + + + + + + + + + + + + + + + + + + + + + + + + + + + + + + + + + + + + + + + + + + + + + + + + + + + + + + + + + + + + + + + + + + + + + + + + + + + + + + + + + + + + + + + + + + + + + + + + + + + + + + + + + + + + + + + + + + + + + + + + + + + + + + + + + + + + + + + + + + + + + + + + + + + + + + + + + + + + + + + + + + + + + + + + + + + + + + + + + + + + + + + + + + + + + + + + + + + + + + + + + + + + + + +   Spoke to pt, told him I do have samples can stop by office to pickup. Pt verbalized understanding. + + + + + + + + + + + + + +

## 2014-09-10 NOTE — Telephone Encounter (Signed)
Spoke to pt, told him I do have samples can stop by office to pickup. Pt verbalized understanding

## 2014-09-12 ENCOUNTER — Other Ambulatory Visit: Payer: Self-pay | Admitting: Internal Medicine

## 2014-10-26 ENCOUNTER — Telehealth: Payer: Self-pay | Admitting: *Deleted

## 2014-10-26 ENCOUNTER — Telehealth: Payer: Self-pay | Admitting: Cardiovascular Disease

## 2014-10-26 NOTE — Telephone Encounter (Signed)
No samples available. Patient given [facility] # to call to see if samples are available there

## 2014-10-26 NOTE — Telephone Encounter (Signed)
Pt would like some samples of Eliquis either milligram please.

## 2014-10-26 NOTE — Telephone Encounter (Signed)
Eliquis samples placed at the front desk for pick up. 

## 2014-11-23 ENCOUNTER — Ambulatory Visit: Payer: Managed Care, Other (non HMO) | Admitting: Cardiovascular Disease

## 2014-11-24 ENCOUNTER — Other Ambulatory Visit: Payer: Self-pay

## 2014-11-24 MED ORDER — LABETALOL HCL 200 MG PO TABS: 200.0000 mg | ORAL_TABLET | Freq: Two times a day (BID) | ORAL | Status: DC

## 2014-11-24 MED ORDER — LISINOPRIL 20 MG PO TABS: 20.0000 mg | ORAL_TABLET | Freq: Every day | ORAL | Status: DC

## 2014-11-24 MED ORDER — FUROSEMIDE 40 MG PO TABS: ORAL_TABLET | ORAL | Status: DC

## 2014-11-24 MED ORDER — APIXABAN 2.5 MG PO TABS: 2.5000 mg | ORAL_TABLET | Freq: Two times a day (BID) | ORAL | Status: DC

## 2014-11-24 MED ORDER — AMLODIPINE BESYLATE 10 MG PO TABS: 10.0000 mg | ORAL_TABLET | Freq: Every day | ORAL | Status: DC

## 2014-11-24 MED ORDER — CALCITRIOL 0.25 MCG PO CAPS: 0.2500 ug | ORAL_CAPSULE | ORAL | Status: DC

## 2014-11-24 NOTE — Telephone Encounter (Signed)
Received a request for Amlodipine, Lisinopril, and labetalol.  Rx sent to pharmacy.

## 2014-11-24 NOTE — Addendum Note (Signed)
Addended by: Colleen Can on: 11/24/2014 11:20 AM   Modules accepted: Orders

## 2014-11-24 NOTE — Telephone Encounter (Signed)
Rx request from Eliquis, Furosemide, Calcitriol. Rx sent to pharmacy.

## 2014-11-25 ENCOUNTER — Telehealth: Payer: Self-pay | Admitting: Cardiovascular Disease

## 2014-11-25 ENCOUNTER — Telehealth: Payer: Self-pay | Admitting: Internal Medicine

## 2014-11-25 NOTE — Telephone Encounter (Signed)
Pt would like some samples of Eliquis 2.5 mg please.

## 2014-11-25 NOTE — Telephone Encounter (Signed)
Unfortunately, we do not have samples available at this time but I do have a Free 30 day trial card.  Pt is aware and states he will come get the card.

## 2014-11-25 NOTE — Telephone Encounter (Signed)
Spoke with pt, aware there are no eliquis 2.5 mg samples available.

## 2014-11-25 NOTE — Telephone Encounter (Signed)
Patient would samples of apixaban (ELIQUIS) 2.5 MG TABS tablet

## 2014-11-26 ENCOUNTER — Other Ambulatory Visit: Payer: Self-pay | Admitting: *Deleted

## 2014-11-26 DIAGNOSIS — I714 Abdominal aortic aneurysm, without rupture, unspecified: Secondary | ICD-10-CM

## 2014-12-02 ENCOUNTER — Telehealth: Payer: Self-pay | Admitting: Cardiovascular Disease

## 2014-12-02 ENCOUNTER — Encounter: Payer: Self-pay | Admitting: Cardiovascular Disease

## 2014-12-02 NOTE — Telephone Encounter (Signed)
Received records from [facility] (Dr Edrick Oh) for appointment with Dr Sallyanne Kuster on 01/06/15  Records given to [facility] (Medical Records) for Dr Croitoru's schedule on 01/06/15. lp

## 2014-12-03 ENCOUNTER — Encounter (HOSPITAL_COMMUNITY): Payer: Self-pay | Admitting: Surgery

## 2014-12-07 ENCOUNTER — Encounter: Payer: Self-pay | Admitting: Vascular Surgery

## 2014-12-08 ENCOUNTER — Encounter: Payer: Self-pay | Admitting: Vascular Surgery

## 2014-12-08 ENCOUNTER — Other Ambulatory Visit: Payer: Managed Care, Other (non HMO)

## 2014-12-08 ENCOUNTER — Ambulatory Visit
Admission: RE | Admit: 2014-12-08 | Discharge: 2014-12-08 | Disposition: A | Discharge status: Home / Self Care | Payer: Managed Care, Other (non HMO) | Source: Ambulatory Visit | Attending: Vascular Surgery | Admitting: Vascular Surgery

## 2014-12-08 ENCOUNTER — Ambulatory Visit (INDEPENDENT_AMBULATORY_CARE_PROVIDER_SITE_OTHER): Payer: Medicare Other | Admitting: Vascular Surgery

## 2014-12-08 VITALS — BP 148/99 | HR 73 | Ht 72.0 in | Wt 376.7 lb

## 2014-12-08 DIAGNOSIS — I714 Abdominal aortic aneurysm, without rupture, unspecified: Secondary | ICD-10-CM

## 2014-12-08 DIAGNOSIS — Z48812 Encounter for surgical aftercare following surgery on the circulatory system: Secondary | ICD-10-CM

## 2014-12-08 NOTE — Addendum Note (Signed)
Addended by: Dorthula Rue L on: 12/08/2014 12:33 PM   Modules accepted: Orders

## 2014-12-08 NOTE — Progress Notes (Signed)
Subjective:     Patient ID: [name], male   DOB: 08-16-1971, 43 y.o.   MRN: [id number]  HPI this 43 year old male returns for continued follow-up regarding his abdominal aortic stent graft placed for an abdominal aortic aneurysm. There was an aortic dissection extending into this aortic aneurysm into the left common iliac artery. He does have chronic renal insufficiency with current creatinine in the 2.2 range. We do not give him contrast for this reason. His aneurysm sac size has contracted down nicely. He denies any new symptoms. He has no claudication. His biggest problem has been blood pressure control.  Past Medical History  Diagnosis Date  . Chronic kidney disease   . History of pulmonary embolus (PE) 2010  . 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 Left ventricular hypertrophy   . Abdominal aortic aneurysm   . Stroke 2009    pt says no stroke  . Hypertension     dr [name]  . Anxiety   . DVT (deep venous thrombosis)   . Obesity   . Chronic renal disease     advanced  . Aortic regurgitation 12/10/08    Echo: mild to mod. AOV regurg,mild AO root dilatation, LA mildly dilated, EF 65%, LV wall thickness markedly increased, small PE    History  Substance Use Topics  . Smoking status: Never Smoker   . Smokeless tobacco: Never Used  . Alcohol Use: No    Family History  Problem Relation Age of Onset  . Hypertension Mother   . Hypertension Father     No Known Allergies  Current outpatient prescriptions: amLODipine (NORVASC) 10 MG tablet, Take 1 tablet (10 mg total) by mouth daily., Disp: 90 tablet, Rfl: 0;  apixaban (ELIQUIS) 2.5 MG TABS tablet, Take 1 tablet (2.5 mg total) by mouth 2 (two) times daily., Disp: 180 tablet, Rfl: 0;  atorvastatin (LIPITOR) 10 MG tablet, TAKE 1 TABLET (10 MG TOTAL) BY MOUTH DAILY., Disp: 90 tablet, Rfl: 3 calcitRIOL (ROCALTROL) 0.25 MCG capsule, Take 1 capsule (0.25 mcg total) by mouth every Monday,  Wednesday, and Friday., Disp: 63 capsule, Rfl: 0;  furosemide (LASIX) 40 MG tablet, TAKE 1 TABLET (40 MG TOTAL) BY MOUTH DAILY., Disp: 90 tablet, Rfl: 0;  labetalol (NORMODYNE) 200 MG tablet, Take 1 tablet (200 mg total) by mouth 2 (two) times daily. (Patient taking differently: Take 200 mg by mouth 3 (three) times daily. ), Disp: 180 tablet, Rfl: 0 lisinopril (PRINIVIL,ZESTRIL) 20 MG tablet, Take 1 tablet (20 mg total) by mouth daily., Disp: 90 tablet, Rfl: 0;  HYDROcodone-homatropine (HYCODAN) 5-1.5 MG/5ML syrup, Take 5 mLs by mouth every 8 (eight) hours as needed for cough (No driving while taking as it will inhibit your ability to operate a motor vehicle.). (Patient not taking: Reported on 12/08/2014), Disp: 120 mL, Rfl: 0  BP 148/99 mmHg  Pulse 73  Ht 6' (1.829 m)  Wt 376 lb 11.2 oz (170.87 kg)  BMI 51.08 kg/m2  SpO2 95%  Body mass index is 51.08 kg/(m^2).           Review of Systems complains of lower extremity edema, poorly controlled hypertension at times, leg discomfort, ongoing morbid obesity, dyspnea on exertion. Continues with chronic renal insufficiency which is mild to moderate in severity. Other systems negative and complete review of systems     Objective:   Physical Exam BP 148/99 mmHg  Pulse 73  Ht 6' (1.829 m)  Wt 376 lb 11.2 oz (  170.87 kg)  BMI 51.08 kg/m2  SpO2 95%  Gen.-alert and oriented x3 in no apparent distress-morbidly obese HEENT normal for age Lungs no rhonchi or wheezing Cardiovascular regular rhythm no murmurs carotid pulses 3+ palpable no bruits audible Abdomen soft nontender no palpable masses-morbidly obese  Musculoskeletal free of  major deformities Skin clear -no rashes Neurologic normal Lower extremities 3+ femoral and dorsalis pedis pulses palpable bilaterally with bilateral 1+  Today I ordered CT scan of the abdomen without contrast which I reviewed and interpreted by computer. The accident diameter of the aneurysm sac appears to be  about 33 mm now compared to 51 mm preoperatively. Unable to evaluate for Endo leaks with no contrast utilized. No migration or fracture of the stent graft.      Assessment:     Doing well post aortic stent graft for infrarenal abdominal aortic aneurysm which contained dissection in the left common iliac artery Chronic renal insufficiency mild to moderate     Plan:     Return in one year with repeat CT scan of abdomen and pelvis with no contrast

## 2014-12-22 ENCOUNTER — Telehealth: Payer: Self-pay | Admitting: Internal Medicine

## 2014-12-22 NOTE — Telephone Encounter (Signed)
ok 

## 2014-12-22 NOTE — Telephone Encounter (Signed)
Pls advise.  

## 2014-12-22 NOTE — Telephone Encounter (Signed)
Pt and wife angela needs cpx before end of feb 2016. Can I create slots?

## 2014-12-22 NOTE — Telephone Encounter (Signed)
Pt and his wife has been sch

## 2015-01-06 ENCOUNTER — Encounter: Payer: Self-pay | Admitting: Cardiovascular Disease

## 2015-01-06 ENCOUNTER — Ambulatory Visit (INDEPENDENT_AMBULATORY_CARE_PROVIDER_SITE_OTHER): Payer: BLUE CROSS/BLUE SHIELD | Admitting: Cardiovascular Disease

## 2015-01-06 VITALS — BP 136/96 | HR 73 | Resp 16 | Ht 72.0 in | Wt 374.8 lb

## 2015-01-06 DIAGNOSIS — I714 Abdominal aortic aneurysm, without rupture, unspecified: Secondary | ICD-10-CM

## 2015-01-06 DIAGNOSIS — I10 Essential (primary) hypertension: Secondary | ICD-10-CM | POA: Diagnosis not present

## 2015-01-06 DIAGNOSIS — I8290 Acute embolism and thrombosis of unspecified vein: Secondary | ICD-10-CM

## 2015-01-06 DIAGNOSIS — G4719 Other hypersomnia: Secondary | ICD-10-CM | POA: Diagnosis not present

## 2015-01-06 DIAGNOSIS — R0681 Apnea, not elsewhere classified: Secondary | ICD-10-CM | POA: Diagnosis not present

## 2015-01-06 DIAGNOSIS — I509 Heart failure, unspecified: Secondary | ICD-10-CM

## 2015-01-06 DIAGNOSIS — I5030 Unspecified diastolic (congestive) heart failure: Secondary | ICD-10-CM

## 2015-01-06 DIAGNOSIS — R0683 Snoring: Secondary | ICD-10-CM

## 2015-01-06 MED ORDER — CARVEDILOL 25 MG PO TABS: 25.0000 mg | ORAL_TABLET | Freq: Two times a day (BID) | ORAL | Status: DC

## 2015-01-06 NOTE — Patient Instructions (Addendum)
Your physician has recommended you make the following change in your medication..  1. STOP labetalol  2. START carvedilol 25mg  twice daily   Your physician has recommended that you have a sleep study. This test records several body functions during sleep, including: brain activity, eye movement, oxygen and carbon dioxide blood levels, heart rate and rhythm, breathing rate and rhythm, the flow of air through your mouth and nose, snoring, body muscle movements, and chest and belly movement. >> this is done at [place] physician recommends that you schedule a follow-up appointment in: 21 days with Erasmo Downer for a BP check   Your physician recommends that you schedule a follow-up appointment in: 8-10 weeks with Dr. Sallyanne Kuster

## 2015-01-07 ENCOUNTER — Encounter: Payer: Self-pay | Admitting: Cardiovascular Disease

## 2015-01-07 NOTE — Progress Notes (Signed)
Patient ID: [name], male   DOB: Sep 12, 1971, 43 y.o.   MRN: [id number]      Reason for office visit HTN, diastolic heart failure, S/P AAA repair, history of DVT   Several of [name]'s meds have been adjusted, partly in response to deteriorating renal function and secondary hyperparathyroidism.  He now has headaches and persistently elevated diastolic BP. He had been well controlled on: carvedilol 25 mg bid, Tribenzor 40/10/25 and furosemide 20 prn. He is now receiving the same dose of amlodipine, but carvedilol has been replaced with labetalol 200 mg BID and the olmesartan with lisinopril 20 mg daily, which I think are both weaker alternatives. His diuretic therapy now is only furosemide 40 mg daily.  He has a 4.9 cm ascending aortic aneurysm, fairly recent DVT/PE, moderate CKD (creatinine most recently 2.1, stable), a stent graft repair for abdominal aortic aneurysm 2 years ago and super-morbid obesity.   No Known Allergies  Current Outpatient Prescriptions  Medication Sig Dispense Refill  . amLODipine (NORVASC) 10 MG tablet Take 1 tablet (10 mg total) by mouth daily. 90 tablet 0  . apixaban (ELIQUIS) 2.5 MG TABS tablet Take 1 tablet (2.5 mg total) by mouth 2 (two) times daily. 180 tablet 0  . atorvastatin (LIPITOR) 10 MG tablet TAKE 1 TABLET (10 MG TOTAL) BY MOUTH DAILY. 90 tablet 3  . calcitRIOL (ROCALTROL) 0.25 MCG capsule Take 1 capsule (0.25 mcg total) by mouth every Monday, Wednesday, and Friday. 63 capsule 0  . furosemide (LASIX) 40 MG tablet TAKE 1 TABLET (40 MG TOTAL) BY MOUTH DAILY. 90 tablet 0  . lisinopril (PRINIVIL,ZESTRIL) 20 MG tablet Take 1 tablet (20 mg total) by mouth daily. 90 tablet 0  . carvedilol (COREG) 25 MG tablet Take 1 tablet (25 mg total) by mouth 2 (two) times daily. 180 tablet 3   No current facility-administered medications for this visit.    Past Medical History  Diagnosis Date  . Chronic kidney disease   . History of pulmonary  embolus (PE) 2010  . 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 Left ventricular hypertrophy   . Abdominal aortic aneurysm   . Stroke 2009    pt says no stroke  . Hypertension     dr [name]  . Anxiety   . DVT (deep venous thrombosis)   . Obesity   . Chronic renal disease     advanced  . Aortic regurgitation 12/10/08    Echo: mild to mod. AOV regurg,mild AO root dilatation, LA mildly dilated, EF 65%, LV wall thickness markedly increased, small PE    Past Surgical History  Procedure Laterality Date  . Cardiac catheterization  02/11/10    false + Nuc  . Corneal transplant    . Intravascular ultrasound  09/25/2012    Procedure: INTRAVASCULAR ULTRASOUND;  Surgeon: Mal [name], MD;  Location: [facility] OR;  Service: Vascular;  Laterality: N/A;  . Abdominal aortic aneurysm repair  09/25/2012    EVAR  . Abdominal aortagram N/A 02/06/2012    Procedure: ABDOMINAL Maxcine Ham;  Surgeon: [name], MD;  Location: [facility] CATH LAB;  Service: Cardiovascular;  Laterality: N/A;    Family History  Problem Relation Age of Onset  . Hypertension Mother   . Hypertension Father     History   Social History  . Marital Status: Married    Spouse Name: N/A    Number of Children: N/A  . Years of Education: N/A  Occupational History  . Not on file.   Social History Main Topics  . Smoking status: Never Smoker   . Smokeless tobacco: Never Used  . Alcohol Use: No  . Drug Use: No  . Sexual Activity:    Partners: Female   Other Topics Concern  . Not on file   Social History Narrative    Review of systems: Headaches The patient specifically denies any chest pain at rest or with exertion, dyspnea at rest or with exertion, orthopnea, paroxysmal nocturnal dyspnea, syncope, palpitations, focal neurological deficits, intermittent claudication, lower extremity edema, unexplained weight gain, cough, hemoptysis or wheezing.  The patient also denies abdominal pain, nausea,  vomiting, dysphagia, diarrhea, constipation, polyuria, polydipsia, dysuria, hematuria, frequency, urgency, abnormal bleeding or bruising, fever, chills, unexpected weight changes, mood swings, change in skin or hair texture, change in voice quality, auditory or visual problems, allergic reactions or rashes, new musculoskeletal complaints other than usual "aches and pains".   PHYSICAL EXAM BP 136/96 mmHg  Pulse 73  Resp 16  Ht 6' (1.829 m)  Wt 374 lb 12.8 oz (170.008 kg)  BMI 50.82 kg/m2 Morbid obesity limits a detailed exam  General: Alert, oriented x3, no distress Head: no evidence of trauma, PERRL, EOMI, no exophtalmos or lid lag, no myxedema, no xanthelasma; normal ears, nose and crowded oropharynx Neck: normal jugular venous pulsations and no hepatojugular reflux; brisk carotid pulses without delay and no carotid bruits Chest: clear to auscultation, no signs of consolidation by percussion or palpation, normal fremitus, symmetrical and full respiratory excursions Cardiovascular: Cannot define the apical impulse, regular rhythm, normal first and second heart sounds, no murmurs, rubs or gallops Abdomen: no tenderness or distention, no masses by palpation, no abnormal pulsatility or arterial bruits, normal bowel sounds, no hepatosplenomegaly Extremities: no clubbing, cyanosis; 1+ ankle edema symmetrically; 2+ radial, ulnar and brachial pulses bilaterally; 2+ right femoral, posterior tibial and dorsalis pedis pulses; 2+ left femoral, posterior tibial and dorsalis pedis pulses; no subclavian or femoral bruits Neurological: grossly nonfocal  EKG: NSR, LVH, QTC 478 ms  Lipid Panel     Component Value Date/Time   CHOL 140 02/29/2012 0829   TRIG 72 02/29/2012 0829   HDL 42 02/29/2012 0829   CHOLHDL 3.3 02/29/2012 0829   VLDL 14 02/29/2012 0829   LDLCALC 84 02/29/2012 0829    BMET    Component Value Date/Time   NA 139 08/20/2014 0804   K 4.2 08/20/2014 0804   CL 107 08/20/2014 0804    CO2 26 08/20/2014 0804   GLUCOSE 109* 08/20/2014 0804   BUN 22 08/20/2014 0804   CREATININE 2.11* 08/20/2014 0804   CREATININE 2.23* 09/01/2013 0455   CALCIUM 9.3 08/20/2014 0804   GFRNONAA 34* 09/01/2013 0455   GFRAA 39* 09/01/2013 0455     ASSESSMENT AND PLAN  Will first restart carvedilol instead of labetalol and if necessary and OK with Dr. [name], plan to increase lisinopril to 40 mg daily I think still not as potent an antihypertensive as olmesartan 40 mg). Switching back to olmesartan or adding hydralazine/nitrates may be needed after that.  I think carvedilol will also be more advantageous than labetalol for his aortic disease.  Orders Placed This Encounter  Procedures  . EKG 12-Lead  . Split night study   Meds ordered this encounter  Medications  . carvedilol (COREG) 25 MG tablet    Sig: Take 1 tablet (25 mg total) by mouth 2 (two) times daily.    Dispense:  180 tablet  Refill:  Kerrick Asiah Befort, MD, [facility] [phone] office [phone] pager

## 2015-01-14 ENCOUNTER — Telehealth: Payer: Self-pay | Admitting: *Deleted

## 2015-01-14 NOTE — Telephone Encounter (Signed)
Medication clarification sent from [facility].  Their records show Dr. Burnice Logan prescribed Labetalol and Dr. Loletha Grayer prescribed Carvedilol - duplicate therapy - should patient be on both drugs?  Response back was:   Labetalol stopped and carvedilol started 01/06/2015 - patient instructed at [place] 01/06/2015.    Faxed this info to [facility].

## 2015-01-21 ENCOUNTER — Other Ambulatory Visit: Payer: Self-pay | Admitting: Cardiovascular Disease

## 2015-02-04 ENCOUNTER — Telehealth: Payer: Self-pay | Admitting: Internal Medicine

## 2015-02-04 NOTE — Telephone Encounter (Signed)
Spoke to pt, told him still need to keep lab appointment will draw other labs needed for physical will not repeat the ones already done. Pt verbalized understanding.

## 2015-02-04 NOTE — Telephone Encounter (Signed)
Patient is scheduled for CPX on 02/18/15 and he wants to know if Dr. Raliegh Ip can use the labs drawn by his kidney doctor for his CPX labs? He would like to try and minimize appointments if possible.

## 2015-02-05 ENCOUNTER — Ambulatory Visit: Payer: Medicare Other | Admitting: Pharmacist Clinician (PhC)/ Clinical Pharmacy Specialist

## 2015-02-11 ENCOUNTER — Other Ambulatory Visit: Payer: Medicare Other

## 2015-02-12 ENCOUNTER — Ambulatory Visit (INDEPENDENT_AMBULATORY_CARE_PROVIDER_SITE_OTHER): Payer: Medicare Other | Admitting: Pharmacist Clinician (PhC)/ Clinical Pharmacy Specialist

## 2015-02-12 VITALS — BP 148/100 | HR 72 | Ht 72.0 in | Wt 374.0 lb

## 2015-02-12 DIAGNOSIS — I10 Essential (primary) hypertension: Secondary | ICD-10-CM

## 2015-02-12 NOTE — Patient Instructions (Signed)
Return for a a follow up appointment in 1 month  Your blood pressure today is 148/100  Go to the lab in about 10-14 days for blood draw to check kidney function  Take your BP meds as follows: stop amlodipine 10 mg and lisinopril 20 mg.  Start Tribenzor 40/5/25.[name]    Exercise as you're able, try to walk approximately 30 minutes per day.  Keep salt intake to a minimum, especially watch canned and prepared boxed foods.  Eat more fresh fruits and vegetables and fewer canned items.  Avoid eating in fast food restaurants.

## 2015-02-14 ENCOUNTER — Encounter: Payer: Self-pay | Admitting: Pharmacist Clinician (PhC)/ Clinical Pharmacy Specialist

## 2015-02-14 MED ORDER — OLMESARTAN-AMLODIPINE-HCTZ 40-5-25 MG PO TABS: 1.0000 | ORAL_TABLET | Freq: Every day | ORAL | Status: DC

## 2015-02-14 NOTE — Progress Notes (Signed)
     02/14/2015 [name] 1971-09-04 [id number]   HPI:  [name] is a 44 y.o. male patient of Dr Sallyanne Kuster, with a PMH below who presents today for hypertension clinic evaluation.  He states having been hypertensive but well controlled for some time on Tribenzor 40/10/25, but then developed some swelling in his ankles and another MD switched him to lisinopril 20 and amlodipine 10, stopping the HCTZ; as well as switching his carvedilol to labetolol.  Dr. Sallyanne Kuster switched him back to carvedilol.    Cardiac Hx: aortic aneurysm, moderate CKD, previous stent graft repair of abdominal aortic aneurysm 2 years ago  Family Hx: both parents are hypertensive, no family history of stroke or MI, with the exception of possibly a paternal aunt  Social Hx: no tobacco, no alcohol, little to no caffeine  Current antihypertensive medications: lisinopril 20, amlodipine 10, carvedilol 25 bid   Current Outpatient Prescriptions  Medication Sig Dispense Refill  . amLODipine (NORVASC) 10 MG tablet Take 1 tablet (10 mg total) by mouth daily. 90 tablet 0  . apixaban (ELIQUIS) 2.5 MG TABS tablet Take 1 tablet (2.5 mg total) by mouth 2 (two) times daily. 180 tablet 0  . atorvastatin (LIPITOR) 10 MG tablet TAKE 1 TABLET (10 MG TOTAL) BY MOUTH DAILY. 90 tablet 3  . calcitRIOL (ROCALTROL) 0.25 MCG capsule Take 1 capsule (0.25 mcg total) by mouth every Monday, Wednesday, and Friday. 63 capsule 0  . carvedilol (COREG) 25 MG tablet Take 1 tablet (25 mg total) by mouth 2 (two) times daily. 180 tablet 3  . ELIQUIS 2.5 MG TABS tablet TAKE 1 TABLET (2.5 MG TOTAL) BY MOUTH 2 (TWO) TIMES DAILY. 60 tablet 1  . furosemide (LASIX) 40 MG tablet TAKE 1 TABLET (40 MG TOTAL) BY MOUTH DAILY. 90 tablet 0  . lisinopril (PRINIVIL,ZESTRIL) 20 MG tablet Take 1 tablet (20 mg total) by mouth daily. 90 tablet 0   No current facility-administered medications for this visit.    No Known Allergies  Past Medical History   Diagnosis Date  . Chronic kidney disease   . History of pulmonary embolus (PE) 2010  . 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 Left ventricular hypertrophy   . Abdominal aortic aneurysm   . Stroke 2009    pt says no stroke  . Hypertension     dr [name]  . Anxiety   . DVT (deep venous thrombosis)   . Obesity   . Chronic renal disease     advanced  . Aortic regurgitation 12/10/08    Echo: mild to mod. AOV regurg,mild AO root dilatation, LA mildly dilated, EF 65%, LV wall thickness markedly increased, small PE    Blood pressure 148/100, pulse 72, height 6' (1.829 m), weight 374 lb (169.645 kg).    Tommy Medal PharmD CPP [facility]

## 2015-02-14 NOTE — Assessment & Plan Note (Signed)
Today his BP remains elevated at 148/100.  Pt is discouraged by lack of BP control since stopping Tribenzor.  He still has some moderate swelling in his legs, although with a recent DVT, I can't be sure amlodipine is the cause.  However since he still has some edema, I will put him back on Tribenzor at 40/5/25 and see him back in a month for follow up.  We talked about his need to lose some weight, however his wife won't let him go to the gym and exercise because he is on disability and she is afraid of losing that.  I suggested that he start back with some basic movement, including treadmill, because his excessive weight may be the bigger problem.  I assured him I would review this with Dr. Sallyanne Kuster, as to what kind of exercise he is allowed to do.

## 2015-02-16 ENCOUNTER — Telehealth: Payer: Self-pay | Admitting: Pharmacist Clinician (PhC)/ Clinical Pharmacy Specialist

## 2015-02-16 NOTE — Telephone Encounter (Signed)
-----   Message from Sanda Klein, MD sent at 02/14/2015  5:42 PM EST ----- No restriction to light exercise, walking, treadmill ----- Message -----    From: [name], RPH-CPP    Sent: 02/14/2015  10:47 AM      To: Sanda Klein, MD  Saw [name] in office Friday.  Talked about need to lose weight, are there any exercise restrictions for him with the aneurysm?  Says he wants to go to gym, but wife is afraid he'll lose his disability if he walks in the door!  I encouraged walking to start.  [name]

## 2015-02-16 NOTE — Telephone Encounter (Signed)
Spoke with patient, relayed information about exercise.

## 2015-02-18 ENCOUNTER — Encounter: Payer: Medicare Other | Admitting: Internal Medicine

## 2015-02-24 DIAGNOSIS — N183 Chronic kidney disease, stage 3 (moderate): Secondary | ICD-10-CM | POA: Diagnosis not present

## 2015-02-24 DIAGNOSIS — E785 Hyperlipidemia, unspecified: Secondary | ICD-10-CM | POA: Diagnosis not present

## 2015-02-24 DIAGNOSIS — N189 Chronic kidney disease, unspecified: Secondary | ICD-10-CM | POA: Diagnosis not present

## 2015-02-24 DIAGNOSIS — D631 Anemia in chronic kidney disease: Secondary | ICD-10-CM | POA: Diagnosis not present

## 2015-02-24 DIAGNOSIS — N2581 Secondary hyperparathyroidism of renal origin: Secondary | ICD-10-CM | POA: Diagnosis not present

## 2015-02-26 ENCOUNTER — Telehealth: Payer: Self-pay | Admitting: Pharmacist Clinician (PhC)/ Clinical Pharmacy Specialist

## 2015-02-26 NOTE — Telephone Encounter (Signed)
Pt called because BP still not to goal.  Systolic running close to [id number], but diastolic still closer to [id number] most days.  Per patient still having some ankle edema, but not daily.  Also more stress because wife was in hospital with MS incident.   Will give him 2 weeks samples of Tribenzor 40/10/25, doubling the amlodipine dose.  Advised him that it may bring his BP down, but if the ankle swelling worsens, we will have to look at other options.  Pt voiced understanding.

## 2015-03-05 ENCOUNTER — Other Ambulatory Visit: Payer: Self-pay | Admitting: *Deleted

## 2015-03-05 MED ORDER — ATORVASTATIN CALCIUM 10 MG PO TABS: ORAL_TABLET | ORAL | Status: DC

## 2015-03-08 DIAGNOSIS — N183 Chronic kidney disease, stage 3 (moderate): Secondary | ICD-10-CM | POA: Diagnosis not present

## 2015-03-12 ENCOUNTER — Ambulatory Visit (INDEPENDENT_AMBULATORY_CARE_PROVIDER_SITE_OTHER): Payer: BLUE CROSS/BLUE SHIELD | Admitting: Pharmacist Clinician (PhC)/ Clinical Pharmacy Specialist

## 2015-03-12 VITALS — BP 130/84 | HR 72 | Ht 72.0 in | Wt 367.4 lb

## 2015-03-12 DIAGNOSIS — I10 Essential (primary) hypertension: Secondary | ICD-10-CM

## 2015-03-12 MED ORDER — OLMESARTAN-AMLODIPINE-HCTZ 40-10-12.5 MG PO TABS: 1.0000 | ORAL_TABLET | Freq: Every day | ORAL | Status: DC

## 2015-03-12 NOTE — Patient Instructions (Signed)
Return for a a follow up appointment with Dr. Loletha Grayer on March 28 at 3:15pm  Your blood pressure today is 130/84   Check your blood pressure at home daily (if able) and keep record of the readings.  Take your BP meds as follows: switch Tribenzor to 40/10/12.5mg    Bring all of your meds, your BP cuff and your record of home blood pressures to your next appointment.  Exercise as you're able, try to walk approximately 30 minutes per day.  Keep salt intake to a minimum, especially watch canned and prepared boxed foods.  Eat more fresh fruits and vegetables and fewer canned items.  Avoid eating in fast food restaurants.    HOW TO TAKE YOUR BLOOD PRESSURE: . Rest 5 minutes before taking your blood pressure. .  Don't smoke or drink caffeinated beverages for at least 30 minutes before. . Take your blood pressure before (not after) you eat. . Sit comfortably with your back supported and both feet on the floor (don't cross your legs). . Elevate your arm to heart level on a table or a desk. . Use the proper sized cuff. It should fit smoothly and snugly around your bare upper arm. There should be enough room to slip a fingertip under the cuff. The bottom edge of the cuff should be 1 inch above the crease of the elbow. . Ideally, take 3 measurements at one sitting and record the average.

## 2015-03-14 ENCOUNTER — Encounter: Payer: Self-pay | Admitting: Pharmacist Clinician (PhC)/ Clinical Pharmacy Specialist

## 2015-03-14 NOTE — Assessment & Plan Note (Signed)
Today his BP is well controlled with the Tribenzor, however he also has a significant rise in his creatinine.  I have asked him to cut his furosemide back to once daily, with the provision that if edema develops he can still take the second dose.  I also will cut back on the HCTZ in his Tribenzor to 12.5mg .  I praised the changes to his diet and lifestyle since I saw him last and encouraged him to continue with these positive steps.  He will repeat BMET this next week with his nephrologist and I have asked that he have a copy sent to [place] as well.  Hopefully his creatinine will drop some, as I would hate to have to stop the Tribenzor with his BP doing so well today.  He is due to see Dr. Sallyanne Kuster at the end of the month, and I can see him again in April, should we need to make any further adjustments to his BP.

## 2015-03-14 NOTE — Progress Notes (Signed)
     03/14/2015 [name] 15-Jul-1971 [id number]   HPI:  [name] is a 44 y.o. male patient of Dr Sallyanne Kuster, with a PMH below who presents today for hypertension clinic evaluation.  He states having been hypertensive but well controlled for some time on Tribenzor 40/10/25, but then developed some swelling in his ankles and another MD switched him to lisinopril 20 and amlodipine 10, stopping the HCTZ; as well as switching his carvedilol to labetolol.  Dr. Sallyanne Kuster switched him back to carvedilol.  At his last visit with me, 1 month ago, we restarted the Tribenzor with a decreased dose of amlodipine (5 mg) because of his continued swelling.  That decreased, however his BP remained elevated, so after about 2 weeks he went back to the full 40/10/25.  He comes in today down 7 pounds with no edema.  He does not that his nephrologist recently did blood work and was concerned about a rise in his creatinine.  Copies of those labs show an increase to 2.75, up almost 30% from the 2.17 we have recorded from December.  He states that he is scheduled for more blood work next week.   He also notes he's been taking furosemide twice daily for several weeks.  Cardiac Hx: aortic aneurysm, moderate CKD, previous stent graft repair of abdominal aortic aneurysm 2 years ago  Family Hx: both parents are hypertensive, no family history of stroke or MI, with the exception of possibly a paternal aunt  Social Hx: no tobacco, no alcohol, little to no caffeine  Current antihypertensive medications: Tribenzor 40/10/25, carvedilol 25 mg bid   Current Outpatient Prescriptions  Medication Sig Dispense Refill  . apixaban (ELIQUIS) 2.5 MG TABS tablet Take 1 tablet (2.5 mg total) by mouth 2 (two) times daily. 180 tablet 0  . atorvastatin (LIPITOR) 10 MG tablet TAKE 1 TABLET (10 MG TOTAL) BY MOUTH DAILY. 90 tablet 1  . calcitRIOL (ROCALTROL) 0.25 MCG capsule Take 1 capsule (0.25 mcg total) by mouth every  Monday, Wednesday, and Friday. 63 capsule 0  . carvedilol (COREG) 25 MG tablet Take 1 tablet (25 mg total) by mouth 2 (two) times daily. 180 tablet 3  . ELIQUIS 2.5 MG TABS tablet TAKE 1 TABLET (2.5 MG TOTAL) BY MOUTH 2 (TWO) TIMES DAILY. 60 tablet 1  . furosemide (LASIX) 40 MG tablet TAKE 1 TABLET (40 MG TOTAL) BY MOUTH DAILY. 90 tablet 0  . Olmesartan-Amlodipine-HCTZ 40-10-12.5 MG TABS Take 1 tablet by mouth daily. 30 tablet 5   No current facility-administered medications for this visit.    No Known Allergies  Past Medical History  Diagnosis Date  . Chronic kidney disease   . History of pulmonary embolus (PE) 2010  . 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 Left ventricular hypertrophy   . Abdominal aortic aneurysm   . Stroke 2009    pt says no stroke  . Hypertension     dr [name]  . Anxiety   . DVT (deep venous thrombosis)   . Obesity   . Chronic renal disease     advanced  . Aortic regurgitation 12/10/08    Echo: mild to mod. AOV regurg,mild AO root dilatation, LA mildly dilated, EF 65%, LV wall thickness markedly increased, small PE    Blood pressure 130/84, pulse 72, height 6' (1.829 m), weight 367 lb 6.4 oz (166.652 kg).    Tommy Medal PharmD CPP [facility]

## 2015-03-15 DIAGNOSIS — N183 Chronic kidney disease, stage 3 (moderate): Secondary | ICD-10-CM | POA: Diagnosis not present

## 2015-03-17 ENCOUNTER — Encounter (HOSPITAL_BASED_OUTPATIENT_CLINIC_OR_DEPARTMENT_OTHER): Payer: Medicare Other

## 2015-03-22 ENCOUNTER — Other Ambulatory Visit: Payer: Self-pay | Admitting: Cardiovascular Disease

## 2015-03-22 ENCOUNTER — Ambulatory Visit (INDEPENDENT_AMBULATORY_CARE_PROVIDER_SITE_OTHER): Payer: BLUE CROSS/BLUE SHIELD | Admitting: Cardiovascular Disease

## 2015-03-22 ENCOUNTER — Encounter: Payer: Self-pay | Admitting: Cardiovascular Disease

## 2015-03-22 VITALS — BP 112/78 | HR 64 | Resp 16 | Ht 72.0 in | Wt 366.2 lb

## 2015-03-22 DIAGNOSIS — N184 Chronic kidney disease, stage 4 (severe): Secondary | ICD-10-CM

## 2015-03-22 DIAGNOSIS — I10 Essential (primary) hypertension: Secondary | ICD-10-CM | POA: Diagnosis not present

## 2015-03-22 DIAGNOSIS — I509 Heart failure, unspecified: Secondary | ICD-10-CM | POA: Diagnosis not present

## 2015-03-22 DIAGNOSIS — I712 Thoracic aortic aneurysm, without rupture: Secondary | ICD-10-CM

## 2015-03-22 DIAGNOSIS — I7121 Aneurysm of the ascending aorta, without rupture: Secondary | ICD-10-CM

## 2015-03-22 DIAGNOSIS — I5030 Unspecified diastolic (congestive) heart failure: Secondary | ICD-10-CM

## 2015-03-22 NOTE — Telephone Encounter (Signed)
Rx(s) sent to pharmacy electronically.  

## 2015-03-22 NOTE — Patient Instructions (Signed)
Dr. Croitoru recommends that you schedule a follow-up appointment in: 6 months    

## 2015-03-22 NOTE — Progress Notes (Signed)
Patient ID: [name], male   DOB: 16-Dec-1971, 44 y.o.   MRN: [id number]      Cardiology Office Note   Date:  03/22/2015   ID:  LADANIAN GUZOWSKI, DOB 07/31/1971, MRN [id number]  PCP:  Nyoka Cowden, MD  Cardiologist:   Sanda Klein, MD   Chief Complaint  Patient presents with  . Follow-up    10 week:  No complaints of chest pain, SOB or edema.  Dizziness if he bends over and stands up too quickly.      History of Present Illness: [name] is a 44 y.o. male who presents for severe systemic hypertension, aneurysm of the ascending aorta with mild to moderate aortic insufficiency, history of infrarenal abdominal aortic dissection, history of pulmonary embolism secondary to lower extremity DVT and chronic kidney disease followed by Dr. [name].  Multiple changes have recently made in his antihypertensive medications. After several iterations we have again found a regimen that seems to work for his blood pressure and is very similar 20 taken the past. He is now on tribenzor 40-10-12.5 mg daily and carvedilol 25 mg twice a day.   He feels much better now that his blood pressure is well controlled. He denies chest pain, shortness of breath, edema. He has mild dizziness if he bends over and stands up too quickly.  March 2, his creatinine was 2.75, slightly up from his previous baseline of 2.1-2.2. After we received that assay we decreased the dose of his furosemide and his hydrochlorothiazide. I suspect his creatinine has improved since then. Blood pressure control remains excellent.  He specifically requested advice regarding nutrition and diet in view of his renal disease and severe hypertension. He would also like to lose weight.  Past Medical History  Diagnosis Date  . Chronic kidney disease   . History of pulmonary embolus (PE) 2010  . 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 Left ventricular hypertrophy   . Abdominal  aortic aneurysm   . Stroke 2009    pt says no stroke  . Hypertension     dr [name]  . Anxiety   . DVT (deep venous thrombosis)   . Obesity   . Chronic renal disease     advanced  . Aortic regurgitation 12/10/08    Echo: mild to mod. AOV regurg,mild AO root dilatation, LA mildly dilated, EF 65%, LV wall thickness markedly increased, small PE    Past Surgical History  Procedure Laterality Date  . Cardiac catheterization  02/11/10    false + Nuc  . Corneal transplant    . Intravascular ultrasound  09/25/2012    Procedure: INTRAVASCULAR ULTRASOUND;  Surgeon: Mal [name], MD;  Location: [facility] OR;  Service: Vascular;  Laterality: N/A;  . Abdominal aortic aneurysm repair  09/25/2012    EVAR  . Abdominal aortagram N/A 02/06/2012    Procedure: ABDOMINAL Maxcine Ham;  Surgeon: [name], MD;  Location: [facility] CATH LAB;  Service: Cardiovascular;  Laterality: N/A;     Current Outpatient Prescriptions  Medication Sig Dispense Refill  . apixaban (ELIQUIS) 2.5 MG TABS tablet Take 1 tablet (2.5 mg total) by mouth 2 (two) times daily. 180 tablet 0  . atorvastatin (LIPITOR) 10 MG tablet TAKE 1 TABLET (10 MG TOTAL) BY MOUTH DAILY. 90 tablet 1  . calcitRIOL (ROCALTROL) 0.25 MCG capsule Take 1 capsule (0.25 mcg total) by mouth every Monday, Wednesday, and Friday. 63 capsule 0  . carvedilol (COREG) 25 MG tablet  Take 1 tablet (25 mg total) by mouth 2 (two) times daily. 180 tablet 3  . ELIQUIS 2.5 MG TABS tablet TAKE 1 TABLET (2.5 MG TOTAL) BY MOUTH 2 (TWO) TIMES DAILY. 60 tablet 3  . furosemide (LASIX) 40 MG tablet TAKE 1 TABLET (40 MG TOTAL) BY MOUTH DAILY. 90 tablet 0  . Olmesartan-Amlodipine-HCTZ 40-10-12.5 MG TABS Take 1 tablet by mouth daily. 30 tablet 5   No current facility-administered medications for this visit.    Allergies:   Review of patient's allergies indicates no known allergies.    Social History:  The patient  reports that he has never smoked. He has never used smokeless tobacco. He  reports that he does not drink alcohol or use illicit drugs.   Family History:  The patient's family history includes Hypertension in his father and mother.    ROS:  Please see the history of present illness.    Otherwise, review of systems positive for none.   All other systems are reviewed and negative.    PHYSICAL EXAM: VS:  BP 112/78 mmHg  Pulse 64  Resp 16  Ht 6' (1.829 m)  Wt 366 lb 3.2 oz (166.107 kg)  BMI 49.65 kg/m2 , BMI Body mass index is 49.65 kg/(m^2).  General: Alert, oriented x3, no distress Head: no evidence of trauma, PERRL, EOMI, no exophtalmos or lid lag, no myxedema, no xanthelasma; normal ears, nose and oropharynx Neck: normal jugular venous pulsations and no hepatojugular reflux; brisk carotid pulses without delay and no carotid bruits Chest: clear to auscultation, no signs of consolidation by percussion or palpation, normal fremitus, symmetrical and full respiratory excursions Cardiovascular: normal position and quality of the apical impulse, regular rhythm, normal first and second heart sounds, no murmurs, rubs or gallops Abdomen: no tenderness or distention, no masses by palpation, no abnormal pulsatility or arterial bruits, normal bowel sounds, no hepatosplenomegaly Extremities: no clubbing, cyanosis or edema; 2+ radial, ulnar and brachial pulses bilaterally; 2+ right femoral, posterior tibial and dorsalis pedis pulses; 2+ left femoral, posterior tibial and dorsalis pedis pulses; no subclavian or femoral bruits Neurological: grossly nonfocal Psych: euthymic mood, full affect   EKG:  EKG is not ordered today.   Recent Labs: 08/20/2014: BUN 22; Creatinine 2.11*; Potassium 4.2; Sodium 139    Lipid Panel    Component Value Date/Time   CHOL 140 02/29/2012 0829   TRIG 72 02/29/2012 0829   HDL 42 02/29/2012 0829   CHOLHDL 3.3 02/29/2012 0829   VLDL 14 02/29/2012 0829   LDLCALC 84 02/29/2012 0829      Wt Readings from Last 3 Encounters:  03/22/15 366  lb 3.2 oz (166.107 kg)  03/14/15 367 lb 6.4 oz (166.652 kg)  02/14/15 374 lb (169.645 kg)      Other studies Reviewed: Additional studies/ records that were reviewed today include: Labs from Dr. Edrick Oh, March 2.   ASSESSMENT AND PLAN:  Mr. Muscato has severe/malignant systemic hypertension requiring 4 different agents for control. We have tried to use the minimum amount of diuretic in view of his chronic kidney disease. He is on a high dose of angiotensin receptor blocker and a tiny dose of thiazide diuretic. There was slight deterioration in renal function compared with his baseline, but I think that this will have improved after the most recent changes of maintenance medications. Further titration of medications may be necessary as his renal function deteriorates.  He has no symptoms related to his ascending aortic aneurysm and no signs of active  venous thromboembolic disease. He is on therapeutic anticoagulation with a renally adjusted dose of apixaban.  Think he will definitely benefit from consultation with dietitian. Dietary restrictions will become more more difficult as his renal disease progresses. He has secondary hyperparathyroidism, requires sodium restriction and a hypocaloric diet to assist with weight loss   Current medicines are reviewed at length with the patient today.  The patient does not have concerns regarding medicines.  The following changes have been made:  no change  Labs/ tests ordered today include:  No orders of the defined types were placed in this encounter.    Mikael Spray, MD  03/22/2015 5:22 PM    Sanda Klein, MD, [facility] [phone] office [phone] pager

## 2015-03-23 NOTE — Addendum Note (Signed)
Addended byChauncy Lean. on: 03/23/2015 11:33 AM   Modules accepted: Orders

## 2015-03-26 ENCOUNTER — Other Ambulatory Visit: Payer: Self-pay

## 2015-03-26 DIAGNOSIS — N184 Chronic kidney disease, stage 4 (severe): Secondary | ICD-10-CM

## 2015-03-26 DIAGNOSIS — Z01810 Encounter for preprocedural cardiovascular examination: Secondary | ICD-10-CM

## 2015-04-17 DIAGNOSIS — M25561 Pain in right knee: Secondary | ICD-10-CM | POA: Diagnosis not present

## 2015-05-03 ENCOUNTER — Other Ambulatory Visit: Payer: Self-pay | Admitting: Internal Medicine

## 2015-05-03 ENCOUNTER — Telehealth: Payer: Self-pay

## 2015-05-03 MED ORDER — CALCITRIOL 0.25 MCG PO CAPS: 0.2500 ug | ORAL_CAPSULE | ORAL | Status: DC

## 2015-05-03 NOTE — Telephone Encounter (Signed)
Rx sent 

## 2015-05-03 NOTE — Telephone Encounter (Signed)
[facility] request for calcitRIOL (ROCALTROL) 0.25 MCG capsule

## 2015-05-07 ENCOUNTER — Other Ambulatory Visit: Payer: Self-pay | Admitting: *Deleted

## 2015-05-07 ENCOUNTER — Encounter: Payer: Self-pay | Admitting: Vascular Surgery

## 2015-05-07 MED ORDER — OLMESARTAN-AMLODIPINE-HCTZ 40-10-12.5 MG PO TABS: 1.0000 | ORAL_TABLET | Freq: Every day | ORAL | Status: DC

## 2015-05-07 NOTE — Telephone Encounter (Signed)
Rx refill sent to patient pharmacy   

## 2015-05-10 ENCOUNTER — Telehealth: Payer: Self-pay | Admitting: Cardiovascular Disease

## 2015-05-10 NOTE — Telephone Encounter (Signed)
Pt had question about historical labwork - question addressed - pt voiced understanding.

## 2015-05-10 NOTE — Telephone Encounter (Signed)
Pt had a question about his blood test and he would like to speak with a nurse about it. Please call  Thanks

## 2015-05-11 ENCOUNTER — Ambulatory Visit (INDEPENDENT_AMBULATORY_CARE_PROVIDER_SITE_OTHER): Payer: BLUE CROSS/BLUE SHIELD | Admitting: Vascular Surgery

## 2015-05-11 ENCOUNTER — Other Ambulatory Visit: Payer: Self-pay | Admitting: Internal Medicine

## 2015-05-11 ENCOUNTER — Telehealth: Payer: Self-pay | Admitting: Internal Medicine

## 2015-05-11 ENCOUNTER — Encounter: Payer: Self-pay | Admitting: Vascular Surgery

## 2015-05-11 ENCOUNTER — Ambulatory Visit (INDEPENDENT_AMBULATORY_CARE_PROVIDER_SITE_OTHER)
Admission: RE | Admit: 2015-05-11 | Discharge: 2015-05-11 | Disposition: A | Discharge status: Home / Self Care | Payer: BLUE CROSS/BLUE SHIELD | Source: Ambulatory Visit | Attending: Vascular Surgery | Admitting: Vascular Surgery

## 2015-05-11 ENCOUNTER — Ambulatory Visit (HOSPITAL_COMMUNITY)
Admission: RE | Admit: 2015-05-11 | Discharge: 2015-05-11 | Disposition: A | Discharge status: Home / Self Care | Payer: BLUE CROSS/BLUE SHIELD | Source: Ambulatory Visit | Attending: Vascular Surgery | Admitting: Vascular Surgery

## 2015-05-11 VITALS — BP 125/83 | HR 73 | Ht 72.0 in | Wt 368.4 lb

## 2015-05-11 DIAGNOSIS — N184 Chronic kidney disease, stage 4 (severe): Secondary | ICD-10-CM | POA: Diagnosis not present

## 2015-05-11 DIAGNOSIS — Z01810 Encounter for preprocedural cardiovascular examination: Secondary | ICD-10-CM | POA: Diagnosis not present

## 2015-05-11 NOTE — Progress Notes (Signed)
Subjective:     Patient ID: [name], male   DOB: 1971/07/31, 44 y.o.   MRN: [id number]  HPI This 44 year old male is well known to me having previously inserted an aortic stent graft for abdominal aortic aneurysm. He has chronic renal insufficiency followed by Dr. Edrick Oh. He was referred for access placement. The patient is right handed. He has never been on hemodialysis. Creatinine currently is 2.75 with a BUN of 36.   Past Medical History  Diagnosis Date  . Chronic kidney disease   . History of pulmonary embolus (PE) 2010  . 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 Left ventricular hypertrophy   . Abdominal aortic aneurysm   . Stroke 2009    pt says no stroke  . Hypertension     dr [name]  . Anxiety   . DVT (deep venous thrombosis)   . Obesity   . Chronic renal disease     advanced  . Aortic regurgitation 12/10/08    Echo: mild to mod. AOV regurg,mild AO root dilatation, LA mildly dilated, EF 65%, LV wall thickness markedly increased, small PE    History  Substance Use Topics  . Smoking status: Never Smoker   . Smokeless tobacco: Never Used  . Alcohol Use: No    Family History  Problem Relation Age of Onset  . Hypertension Mother   . Hypertension Father     No Known Allergies   Current outpatient prescriptions:  .  atorvastatin (LIPITOR) 10 MG tablet, TAKE 1 TABLET (10 MG TOTAL) BY MOUTH DAILY., Disp: 90 tablet, Rfl: 1 .  calcitRIOL (ROCALTROL) 0.25 MCG capsule, Take 1 capsule (0.25 mcg total) by mouth every Monday, Wednesday, and Friday., Disp: 63 capsule, Rfl: 1 .  carvedilol (COREG) 25 MG tablet, Take 1 tablet (25 mg total) by mouth 2 (two) times daily., Disp: 180 tablet, Rfl: 3 .  ELIQUIS 2.5 MG TABS tablet, TAKE 1 TABLET (2.5 MG TOTAL) BY MOUTH 2 (TWO) TIMES DAILY., Disp: 60 tablet, Rfl: 3 .  furosemide (LASIX) 40 MG tablet, Take 1 tablet by mouth  daily, Disp: 90 tablet, Rfl: 0 .  Olmesartan-Amlodipine-HCTZ  40-10-12.5 MG TABS, Take 1 tablet by mouth daily., Disp: 90 tablet, Rfl: 2 .  amLODipine (NORVASC) 10 MG tablet, Take 1 tablet by mouth  daily (Patient not taking: Reported on 05/11/2015), Disp: 90 tablet, Rfl: 0 .  apixaban (ELIQUIS) 2.5 MG TABS tablet, Take 1 tablet (2.5 mg total) by mouth 2 (two) times daily. (Patient not taking: Reported on 05/11/2015), Disp: 180 tablet, Rfl: 0 .  lisinopril (PRINIVIL,ZESTRIL) 20 MG tablet, Take 1 tablet by mouth  daily (Patient not taking: Reported on 05/11/2015), Disp: 90 tablet, Rfl: 0  Filed Vitals:   05/11/15 1444  BP: 125/83  Pulse: 73  Height: 6' (1.829 m)  Weight: 368 lb 6.4 oz (167.105 kg)  SpO2: 95%    Body mass index is 49.95 kg/(m^2).           Review of Systems Denies chest pain. Does have history of DVT in lower extremities and PE. Currently on Eliquis  Twice a day. History of aortic dissection. Also remote history of stroke. Patient has morbid obesity. Other systems negative and complete review of systems     Objective:   Physical Exam BP 125/83 mmHg  Pulse 73  Ht 6' (1.829 m)  Wt 368 lb 6.4 oz (167.105 kg)  BMI 49.95 kg/m2  SpO2 95%  Gen.-alert and  oriented x3 in no apparent distress -morbidly obese HEENT normal for age Lungs no rhonchi or wheezing Cardiovascular regular rhythm no murmurs carotid pulses 3+ palpable no bruits audible Abdomen soft nontender no palpable masses Musculoskeletal free of  major deformities Skin clear -no rashes Neurologic normal Lower extremities 3+ femoral and dorsalis pedis pulses palpable bilaterally with no edema    Both upper extremities with 2+ radial pulse palpable. Left cephalic vein imaged with the SonoSite ultrasound at the bedside. X line also ordered vein mapping which I reviewed and interpreted. The arterial vessels in the upper extremities are normal. The caliber of the cephalic veins in the forearm appears adequate for fistula creation as it does in the upper arms.        Assessment:      chronic renal disease stage IV needs vascular access and right-handed male  patient on chronic anticoagulation for history of PE and DVT  Status post infrarenal aortic stent graft with proximal aortic dissection     Plan:      we'll schedule for left radial-cephalic AV fistula on Wednesday, May 25 as outpatient  patient will take no anticoagulants on Tuesday May 24 and resume 1 day postop   discussed with patient and he understands and agrees and would like to proceed

## 2015-05-11 NOTE — Telephone Encounter (Signed)
Pt would like donna to return his call concerning medication

## 2015-05-11 NOTE — Telephone Encounter (Signed)
Left message on voicemail to call office.  

## 2015-05-12 NOTE — Telephone Encounter (Signed)
Spoke to pt, he said need to take two medications off his med list he is no longer taking them. His Cardiologist discontinued them and he received refills in the mail for them. Told pt sorry was not aware that you were not taking them. I will take medications off right now as we speak. Pt verbalized understanding. Medications taken off.

## 2015-05-14 ENCOUNTER — Other Ambulatory Visit: Payer: Self-pay

## 2015-05-18 ENCOUNTER — Encounter (HOSPITAL_COMMUNITY): Payer: Self-pay | Admitting: *Deleted

## 2015-05-18 MED ORDER — DEXTROSE 5 % IV SOLN: 1.5000 g | INTRAVENOUS | Status: AC

## 2015-05-18 NOTE — Progress Notes (Signed)
Pt denies SOB and chest pain. Pt is under the care of Dr. Sallyanne Kuster, cardiology. Spoke with Ebony Hail, [place] ( anesthesia) regarding pt history; no new orders given. Pt stated last dose of Eliquis was 05/17/15. Pt verbalized understanding of all pre-op instructions.

## 2015-05-18 NOTE — Progress Notes (Signed)
Anesthesia Chart Review:  SAME DAY WORK-UP.  Patient is a 44 year old male scheduled for left radiocephalic AVF tomorrow by Dr. Kellie Simmering.  History includes CKD (not yet on HD), infrarenal AAA and bilateral CIA aneurysms associated with dissection extending from the aorta to the left CIA aortic dissection '10 s/p insertion of aorta-bi-CIA stent graft 09/25/12, ascending aorta aneurysm (4.2 cm by 09/17/13 CT), LVH, aortic regurgitation (trival 07/2013 echo), DVT/PE '10, CVA '09, non-smoker, anxiety, corneal transplant. BMI is consistent with morbid obesity.  PCP is Dr. Burnice Logan.  Nephrologist is Dr. [name]. Cardiologist is Dr. Sallyanne Kuster, last visit 03/22/15.  Meds include Lipitor, Coreg, Lasix, olmesartan-amlodipine-HCTZ, calcitriol. He has been on Eliquis (not currently taking; VVS instructed to hold after 05/17/15).  01/06/15 EKG: NSR, minimal voltage criteria for LVH, may be normal variant, T wave abnormality, consider lateral ischemia, prolonged QT.  EKG already reviewed by Dr. Lorenza Cambridge. He had high lateral T wave abnormality dating back to at least 01/2012.  08/12/13 Echo: Study Conclusions - Procedure narrative: Transthoracic echocardiography. Image quality was suboptimal. The study was technically difficult, as a result of poor sound wave transmission and body habitus. - Left ventricle: The cavity size was normal. There was severe concentric hypertrophy. Systolic function was vigorous. The estimated ejection fraction was in the range of 65% to 70%. Doppler parameters are consistent with abnormal left ventricular relaxation (grade 1 diastolic dysfunction). The E/e' ratio is >10, suggesting elevated LV filling pressure. - Aortic valve: Structurally normal valve. Trileaflet. Trivial regurgitation. - Ascending aorta: The ascending aorta was moderately dilated, measuring up to 4.9 cm. - Left atrium: LA Volume/BSA 29.1 ml/m2. The atrium was mildly dilated. - Inferior vena  cava: The vessel was normal in size; the respirophasic diameter changes were in the normal range (= 50%); findings are consistent with normal central venous pressure.  02/11/10 Cardiac cath: Normal coronary arteries with normal LV function by 2-D echo. False positive Myoview was subsequently related to his body habitus.  12/08/14 CT ABD/PELVIS:  IMPRESSION: 1. Stable aorto-bi-iliac stent graft with stable caliber abdominal aorta and bilateral common iliac arteries. The abdominal aorta measures 3.2 cm in greatest AP diameter. The common iliac arteries at the inferior margin of the stent graft measure 2.9 cm bilaterally. 2. Cholelithiasis.  His is for ISTAT and anesthesiologist evaluation tomorrow.  If labs acceptable and no acute changes then I anticipate that he can proceed as planned.  [name] [facility] Short Stay Center/Anesthesiology Phone [phone] 05/18/2015 4:27 PM

## 2015-05-18 NOTE — Progress Notes (Signed)
   05/18/15 1634  OBSTRUCTIVE SLEEP APNEA  Have you ever been diagnosed with sleep apnea through a sleep study? No  Do you snore loudly (loud enough to be heard through closed doors)?  1  Do you often feel tired, fatigued, or sleepy during the daytime? 0  Has anyone observed you stop breathing during your sleep? 1  Do you have, or are you being treated for high blood pressure? 1  BMI more than 35 kg/m2? 1  Age over 46 years old? 0  Gender: 1

## 2015-05-19 ENCOUNTER — Ambulatory Visit (HOSPITAL_COMMUNITY)
Admission: RE | Admit: 2015-05-19 | Discharge: 2015-05-19 | Disposition: A | Discharge status: Home / Self Care | Payer: BLUE CROSS/BLUE SHIELD | Source: Ambulatory Visit | Attending: Vascular Surgery | Admitting: Vascular Surgery

## 2015-05-19 ENCOUNTER — Encounter (HOSPITAL_COMMUNITY)
Admission: RE | Disposition: A | Discharge status: Home / Self Care | Payer: Self-pay | Source: Ambulatory Visit | Attending: Vascular Surgery

## 2015-05-19 ENCOUNTER — Ambulatory Visit (HOSPITAL_COMMUNITY): Payer: BLUE CROSS/BLUE SHIELD | Admitting: Vascular Surgery

## 2015-05-19 ENCOUNTER — Other Ambulatory Visit: Payer: Self-pay | Admitting: *Deleted

## 2015-05-19 ENCOUNTER — Encounter (HOSPITAL_COMMUNITY): Payer: Self-pay | Admitting: *Deleted

## 2015-05-19 DIAGNOSIS — I129 Hypertensive chronic kidney disease with stage 1 through stage 4 chronic kidney disease, or unspecified chronic kidney disease: Secondary | ICD-10-CM | POA: Insufficient documentation

## 2015-05-19 DIAGNOSIS — Z86711 Personal history of pulmonary embolism: Secondary | ICD-10-CM | POA: Insufficient documentation

## 2015-05-19 DIAGNOSIS — Z7901 Long term (current) use of anticoagulants: Secondary | ICD-10-CM | POA: Insufficient documentation

## 2015-05-19 DIAGNOSIS — F419 Anxiety disorder, unspecified: Secondary | ICD-10-CM | POA: Diagnosis not present

## 2015-05-19 DIAGNOSIS — Z4931 Encounter for adequacy testing for hemodialysis: Secondary | ICD-10-CM

## 2015-05-19 DIAGNOSIS — E669 Obesity, unspecified: Secondary | ICD-10-CM | POA: Diagnosis not present

## 2015-05-19 DIAGNOSIS — Z6841 Body Mass Index (BMI) 40.0 and over, adult: Secondary | ICD-10-CM | POA: Diagnosis not present

## 2015-05-19 DIAGNOSIS — Z86718 Personal history of other venous thrombosis and embolism: Secondary | ICD-10-CM | POA: Diagnosis not present

## 2015-05-19 DIAGNOSIS — Z8673 Personal history of transient ischemic attack (TIA), and cerebral infarction without residual deficits: Secondary | ICD-10-CM | POA: Diagnosis not present

## 2015-05-19 DIAGNOSIS — N184 Chronic kidney disease, stage 4 (severe): Secondary | ICD-10-CM | POA: Insufficient documentation

## 2015-05-19 DIAGNOSIS — D649 Anemia, unspecified: Secondary | ICD-10-CM | POA: Diagnosis not present

## 2015-05-19 DIAGNOSIS — N186 End stage renal disease: Secondary | ICD-10-CM

## 2015-05-19 HISTORY — DX: Anemia, unspecified: D64.9

## 2015-05-19 HISTORY — PX: AV FISTULA PLACEMENT: SHX1204

## 2015-05-19 LAB — PROTIME-INR
INR: 1.21 (ref 0.00–1.49)
Prothrombin Time: 15.5 seconds — ABNORMAL HIGH (ref 11.6–15.2)

## 2015-05-19 LAB — POCT I-STAT 4, (NA,K, GLUC, HGB,HCT)
GLUCOSE: 101 mg/dL — AB (ref 65–99)
HCT: 37 % — ABNORMAL LOW (ref 39.0–52.0)
HEMOGLOBIN: 12.6 g/dL — AB (ref 13.0–17.0)
Potassium: 3.9 mmol/L (ref 3.5–5.1)
Sodium: 142 mmol/L (ref 135–145)

## 2015-05-19 SURGERY — ARTERIOVENOUS (AV) FISTULA CREATION
Anesthesia: Monitor Anesthesia Care | Site: Arm Lower | Laterality: Left

## 2015-05-19 MED ORDER — OXYCODONE HCL 5 MG PO TABS: 5.0000 mg | ORAL_TABLET | Freq: Four times a day (QID) | ORAL | Status: DC | PRN

## 2015-05-19 MED ORDER — SODIUM CHLORIDE 0.9 % IV SOLN
INTRAVENOUS | Status: DC
  Administered 2015-05-19 (×2): via INTRAVENOUS

## 2015-05-19 MED ORDER — NEOSTIGMINE METHYLSULFATE 10 MG/10ML IV SOLN
INTRAVENOUS | Status: AC
  Filled 2015-05-19: qty 3

## 2015-05-19 MED ORDER — 0.9 % SODIUM CHLORIDE (POUR BTL) OPTIME
TOPICAL | Status: DC | PRN
  Administered 2015-05-19: 1000 mL

## 2015-05-19 MED ORDER — PROPOFOL INFUSION 10 MG/ML OPTIME
INTRAVENOUS | Status: DC | PRN
  Administered 2015-05-19: 50 ug/kg/min via INTRAVENOUS

## 2015-05-19 MED ORDER — ONDANSETRON HCL 4 MG/2ML IJ SOLN
INTRAMUSCULAR | Status: AC
  Filled 2015-05-19: qty 2

## 2015-05-19 MED ORDER — DEXTROSE 5 % IV SOLN
INTRAVENOUS | Status: AC
  Administered 2015-05-19: 1.5 g via INTRAVENOUS
  Filled 2015-05-19: qty 1.5

## 2015-05-19 MED ORDER — MIDAZOLAM HCL 5 MG/5ML IJ SOLN
INTRAMUSCULAR | Status: DC | PRN
  Administered 2015-05-19: 2 mg via INTRAVENOUS

## 2015-05-19 MED ORDER — OXYCODONE HCL 5 MG PO TABS: 5.0000 mg | ORAL_TABLET | Freq: Once | ORAL | Status: DC | PRN

## 2015-05-19 MED ORDER — ONDANSETRON HCL 4 MG/2ML IJ SOLN: 4.0000 mg | Freq: Four times a day (QID) | INTRAMUSCULAR | Status: DC | PRN

## 2015-05-19 MED ORDER — SODIUM CHLORIDE 0.9 % IR SOLN
Status: DC | PRN
  Administered 2015-05-19: 15:00:00

## 2015-05-19 MED ORDER — MIDAZOLAM HCL 2 MG/2ML IJ SOLN
INTRAMUSCULAR | Status: AC
  Filled 2015-05-19: qty 2

## 2015-05-19 MED ORDER — LIDOCAINE HCL (CARDIAC) 20 MG/ML IV SOLN
INTRAVENOUS | Status: DC | PRN
  Administered 2015-05-19: 40 mg via INTRAVENOUS

## 2015-05-19 MED ORDER — FENTANYL CITRATE (PF) 100 MCG/2ML IJ SOLN
INTRAMUSCULAR | Status: DC | PRN
  Administered 2015-05-19: 100 ug via INTRAVENOUS

## 2015-05-19 MED ORDER — FENTANYL CITRATE (PF) 100 MCG/2ML IJ SOLN: 25.0000 ug | INTRAMUSCULAR | Status: DC | PRN

## 2015-05-19 MED ORDER — OXYCODONE HCL 5 MG/5ML PO SOLN: 5.0000 mg | Freq: Once | ORAL | Status: DC | PRN

## 2015-05-19 MED ORDER — LIDOCAINE-EPINEPHRINE (PF) 1 %-1:200000 IJ SOLN
INTRAMUSCULAR | Status: DC | PRN
  Administered 2015-05-19: 15 mL

## 2015-05-19 MED ORDER — FENTANYL CITRATE (PF) 250 MCG/5ML IJ SOLN
INTRAMUSCULAR | Status: AC
  Filled 2015-05-19: qty 5

## 2015-05-19 MED ORDER — LIDOCAINE-EPINEPHRINE (PF) 1 %-1:200000 IJ SOLN
INTRAMUSCULAR | Status: AC
  Filled 2015-05-19: qty 10

## 2015-05-19 MED ORDER — CHLORHEXIDINE GLUCONATE CLOTH 2 % EX PADS: 6.0000 | MEDICATED_PAD | Freq: Once | CUTANEOUS | Status: DC

## 2015-05-19 SURGICAL SUPPLY — 24 items
ARMBAND PINK RESTRICT EXTREMIT (MISCELLANEOUS) ×2 IMPLANT
CANISTER SUCTION 2500CC (MISCELLANEOUS) ×2 IMPLANT
CLIP TI MEDIUM 6 (CLIP) ×2 IMPLANT
CLIP TI WIDE RED SMALL 6 (CLIP) ×2 IMPLANT
COVER PROBE W GEL 5X96 (DRAPES) IMPLANT
DRAIN PENROSE 1/4X12 LTX STRL (WOUND CARE) ×2 IMPLANT
ELECT REM PT RETURN 9FT ADLT (ELECTROSURGICAL) ×2
ELECTRODE REM PT RTRN 9FT ADLT (ELECTROSURGICAL) ×1 IMPLANT
GEL ULTRASOUND 20GR AQUASONIC (MISCELLANEOUS) IMPLANT
GLOVE SS BIOGEL STRL SZ 7 (GLOVE) ×1 IMPLANT
GLOVE SUPERSENSE BIOGEL SZ 7 (GLOVE) ×1
GOWN STRL REUS W/ TWL LRG LVL3 (GOWN DISPOSABLE) ×3 IMPLANT
GOWN STRL REUS W/TWL LRG LVL3 (GOWN DISPOSABLE) ×6
KIT BASIN OR (CUSTOM PROCEDURE TRAY) ×2 IMPLANT
KIT ROOM TURNOVER OR (KITS) ×2 IMPLANT
LIQUID BAND (GAUZE/BANDAGES/DRESSINGS) ×2 IMPLANT
NS IRRIG 1000ML POUR BTL (IV SOLUTION) ×2 IMPLANT
PACK CV ACCESS (CUSTOM PROCEDURE TRAY) ×2 IMPLANT
PAD ARMBOARD 7.5X6 YLW CONV (MISCELLANEOUS) ×4 IMPLANT
SUT PROLENE 6 0 BV (SUTURE) ×2 IMPLANT
SUT VIC AB 3-0 SH 27 (SUTURE) ×2
SUT VIC AB 3-0 SH 27X BRD (SUTURE) ×1 IMPLANT
UNDERPAD 30X30 INCONTINENT (UNDERPADS AND DIAPERS) ×2 IMPLANT
WATER STERILE IRR 1000ML POUR (IV SOLUTION) ×2 IMPLANT

## 2015-05-19 NOTE — Interval H&P Note (Signed)
History and Physical Interval Note:  05/19/2015 1:48 PM  [name]  has presented today for surgery, with the diagnosis of End Stage Renal Disease N18.6  The various methods of treatment have been discussed with the patient and family. After consideration of risks, benefits and other options for treatment, the patient has consented to  Procedure(s): ARTERIOVENOUS (AV) FISTULA CREATION-LEFT RADIOCEPHALIC (Left) as a surgical intervention .  The patient's history has been reviewed, patient examined, no change in status, stable for surgery.  I have reviewed the patient's chart and labs.  Questions were answered to the patient's satisfaction.     [name]

## 2015-05-19 NOTE — H&P (View-Only) (Signed)
Subjective:     Patient ID: [name], male   DOB: January 13, 1971, 44 y.o.   MRN: [id number]  HPI This 44 year old male is well known to me having previously inserted an aortic stent graft for abdominal aortic aneurysm. He has chronic renal insufficiency followed by Dr. Edrick Oh. He was referred for access placement. The patient is right handed. He has never been on hemodialysis. Creatinine currently is 2.75 with a BUN of 36.   Past Medical History  Diagnosis Date  . Chronic kidney disease   . History of pulmonary embolus (PE) 2010  . 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 Left ventricular hypertrophy   . Abdominal aortic aneurysm   . Stroke 2009    pt says no stroke  . Hypertension     dr [name]  . Anxiety   . DVT (deep venous thrombosis)   . Obesity   . Chronic renal disease     advanced  . Aortic regurgitation 12/10/08    Echo: mild to mod. AOV regurg,mild AO root dilatation, LA mildly dilated, EF 65%, LV wall thickness markedly increased, small PE    History  Substance Use Topics  . Smoking status: Never Smoker   . Smokeless tobacco: Never Used  . Alcohol Use: No    Family History  Problem Relation Age of Onset  . Hypertension Mother   . Hypertension Father     No Known Allergies   Current outpatient prescriptions:  .  atorvastatin (LIPITOR) 10 MG tablet, TAKE 1 TABLET (10 MG TOTAL) BY MOUTH DAILY., Disp: 90 tablet, Rfl: 1 .  calcitRIOL (ROCALTROL) 0.25 MCG capsule, Take 1 capsule (0.25 mcg total) by mouth every Monday, Wednesday, and Friday., Disp: 63 capsule, Rfl: 1 .  carvedilol (COREG) 25 MG tablet, Take 1 tablet (25 mg total) by mouth 2 (two) times daily., Disp: 180 tablet, Rfl: 3 .  ELIQUIS 2.5 MG TABS tablet, TAKE 1 TABLET (2.5 MG TOTAL) BY MOUTH 2 (TWO) TIMES DAILY., Disp: 60 tablet, Rfl: 3 .  furosemide (LASIX) 40 MG tablet, Take 1 tablet by mouth  daily, Disp: 90 tablet, Rfl: 0 .  Olmesartan-Amlodipine-HCTZ  40-10-12.5 MG TABS, Take 1 tablet by mouth daily., Disp: 90 tablet, Rfl: 2 .  amLODipine (NORVASC) 10 MG tablet, Take 1 tablet by mouth  daily (Patient not taking: Reported on 05/11/2015), Disp: 90 tablet, Rfl: 0 .  apixaban (ELIQUIS) 2.5 MG TABS tablet, Take 1 tablet (2.5 mg total) by mouth 2 (two) times daily. (Patient not taking: Reported on 05/11/2015), Disp: 180 tablet, Rfl: 0 .  lisinopril (PRINIVIL,ZESTRIL) 20 MG tablet, Take 1 tablet by mouth  daily (Patient not taking: Reported on 05/11/2015), Disp: 90 tablet, Rfl: 0  Filed Vitals:   05/11/15 1444  BP: 125/83  Pulse: 73  Height: 6' (1.829 m)  Weight: 368 lb 6.4 oz (167.105 kg)  SpO2: 95%    Body mass index is 49.95 kg/(m^2).           Review of Systems Denies chest pain. Does have history of DVT in lower extremities and PE. Currently on Eliquis  Twice a day. History of aortic dissection. Also remote history of stroke. Patient has morbid obesity. Other systems negative and complete review of systems     Objective:   Physical Exam BP 125/83 mmHg  Pulse 73  Ht 6' (1.829 m)  Wt 368 lb 6.4 oz (167.105 kg)  BMI 49.95 kg/m2  SpO2 95%  Gen.-alert and  oriented x3 in no apparent distress -morbidly obese HEENT normal for age Lungs no rhonchi or wheezing Cardiovascular regular rhythm no murmurs carotid pulses 3+ palpable no bruits audible Abdomen soft nontender no palpable masses Musculoskeletal free of  major deformities Skin clear -no rashes Neurologic normal Lower extremities 3+ femoral and dorsalis pedis pulses palpable bilaterally with no edema    Both upper extremities with 2+ radial pulse palpable. Left cephalic vein imaged with the SonoSite ultrasound at the bedside. X line also ordered vein mapping which I reviewed and interpreted. The arterial vessels in the upper extremities are normal. The caliber of the cephalic veins in the forearm appears adequate for fistula creation as it does in the upper arms.        Assessment:      chronic renal disease stage IV needs vascular access and right-handed male  patient on chronic anticoagulation for history of PE and DVT  Status post infrarenal aortic stent graft with proximal aortic dissection     Plan:      we'll schedule for left radial-cephalic AV fistula on Wednesday, May 25 as outpatient  patient will take no anticoagulants on Tuesday May 24 and resume 1 day postop   discussed with patient and he understands and agrees and would like to proceed

## 2015-05-19 NOTE — Discharge Instructions (Signed)
Restart your Eliquis Thursday, May 20, 2015.       05/19/2015 [name] [id number] 10/13/1971  Surgeon(s): Mal [name], MD  Procedure(s): ARTERIOVENOUS (AV) FISTULA CREATION-LEFT RADIOCEPHALIC  x Do not stick graft for 12 weeks

## 2015-05-19 NOTE — Transfer of Care (Signed)
Immediate Anesthesia Transfer of Care Note  Patient: [name]  Procedure(s) Performed: Procedure(s): ARTERIOVENOUS (AV) FISTULA CREATION-LEFT RADIOCEPHALIC (Left)  Patient Location: PACU  Anesthesia Type:MAC  Level of Consciousness: awake, alert  and oriented  Airway & Oxygen Therapy: Patient Spontanous Breathing and Patient connected to nasal cannula oxygen  Post-op Assessment: Report given to RN, Post -op Vital signs reviewed and stable and Patient moving all extremities X 4  Post vital signs: Reviewed and stable  Last Vitals:  Filed Vitals:   05/19/15 1615  BP: 149/88  Pulse: 57  Temp: 36.7 C  Resp: 13    Complications: No apparent anesthesia complications

## 2015-05-19 NOTE — Anesthesia Preprocedure Evaluation (Signed)
Anesthesia Evaluation  Patient identified by MRN, date of birth, ID band Patient awake    Reviewed: Allergy & Precautions, NPO status , Patient's Chart, lab work & pertinent test results  Airway Mallampati: III   Neck ROM: full    Dental   Pulmonary shortness of breath,  breath sounds clear to auscultation        Cardiovascular hypertension, + Peripheral Vascular Disease Rhythm:regular Rate:Normal     Neuro/Psych Anxiety CVA    GI/Hepatic   Endo/Other  Morbid obesity  Renal/GU ESRFRenal disease     Musculoskeletal   Abdominal   Peds  Hematology   Anesthesia Other Findings   Reproductive/Obstetrics                             Anesthesia Physical Anesthesia Plan  ASA: III  Anesthesia Plan: MAC   Post-op Pain Management:    Induction: Intravenous  Airway Management Planned: Simple Face Mask  Additional Equipment:   Intra-op Plan:   Post-operative Plan:   Informed Consent: I have reviewed the patients History and Physical, chart, labs and discussed the procedure including the risks, benefits and alternatives for the proposed anesthesia with the patient or authorized representative who has indicated his/her understanding and acceptance.     Plan Discussed with: CRNA, Anesthesiologist and Surgeon  Anesthesia Plan Comments:         Anesthesia Quick Evaluation

## 2015-05-19 NOTE — Op Note (Signed)
OPERATIVE REPORT  Date of Surgery: 05/19/2015  Surgeon: [name], MD  Assistant: Nurse  Pre-op Diagnosis: Chronic Kidney Disease Stage Four  Post-op Diagnosis: Chronic Kidney Disease Stage Four  Procedure: Procedure(s): ARTERIOVENOUS (AV) FISTULA CREATION-LEFT RADIOCEPHALIC  Anesthesia: Mac  EBL: Minimal  Complications: None  Procedure Details: Patient was taken the operating room placed in supine position at which time the left upper extremity was prepped with Betadine scrub and solution draped in routine sterile manner. The cephalic vein had been marked in the forearm by visualizing it with the size site ultrasound prior to prepping and draping. After infiltration of 1% Xylocaine with epinephrine short longitudinal incision was made between the radial artery and cephalic vein just proximal to the wrist. Cephalic vein was dissected free so branches ligated with 4-0 silk ties and divided. It was ligated distally and transected gently dilated with heparinized saline it was a 2-1/2-3 mm vein of good quality. Radial artery was exposed beneath fascia was encircled with Vesseloops. It was a 3 mm artery with an excellent pulse. It was occluded proximally and distally with vascular bulldog clamps opened 15 blade extended with Potts scissors had excellent inflow and backbleeding. The vein was carefully measured spatulated anastomosis inside with 60 proline. Vesseloops and clamps were then released there was good pulse and thrill in fistula with excellent Doppler flow up to the antecubital area. No heparin or protamine was given wounds irrigated with saline and closed in layers with 5: A subcuticular fashion sterile dressing applied patient taken to recovery room in satisfactory condition   [name], MD 05/19/2015 3:35 PM

## 2015-05-20 ENCOUNTER — Encounter (HOSPITAL_COMMUNITY): Payer: Self-pay | Admitting: Vascular Surgery

## 2015-05-20 NOTE — Anesthesia Postprocedure Evaluation (Signed)
Anesthesia Post Note  Patient: [name]  Procedure(s) Performed: Procedure(s) (LRB): ARTERIOVENOUS (AV) FISTULA CREATION-LEFT RADIOCEPHALIC (Left)  Anesthesia type: MAC  Patient location: PACU  Post pain: Pain level controlled and Adequate analgesia  Post assessment: Post-op Vital signs reviewed, Patient's Cardiovascular Status Stable and Respiratory Function Stable  Last Vitals:  Filed Vitals:   05/19/15 1615  BP: 149/88  Pulse: 57  Temp: 36.7 C  Resp: 13    Post vital signs: Reviewed and stable  Level of consciousness: awake, alert  and oriented  Complications: No apparent anesthesia complications

## 2015-05-21 ENCOUNTER — Telehealth: Payer: Self-pay | Admitting: Vascular Surgery

## 2015-05-21 NOTE — Telephone Encounter (Signed)
LM for pt re appt, dpm °

## 2015-05-21 NOTE — Telephone Encounter (Signed)
-----   Message from Mena Goes, RN sent at 05/19/2015  4:41 PM EDT ----- Regarding: Schedule   ----- Message -----    From: Gabriel Earing, PA-C    Sent: 05/19/2015   3:38 PM      To: [facility] Charge Pool  S/p left radiocephalic AVF [id number].  F/u with Dr. Kellie Simmering in 6-8 weeks with duplex.   Thanks, Aldona Bar

## 2015-05-28 ENCOUNTER — Ambulatory Visit (INDEPENDENT_AMBULATORY_CARE_PROVIDER_SITE_OTHER): Payer: Medicare Other | Admitting: Family Medicine

## 2015-05-28 ENCOUNTER — Encounter: Payer: Self-pay | Admitting: Family Medicine

## 2015-05-28 VITALS — BP 118/88 | HR 85 | Temp 98.9°F

## 2015-05-28 DIAGNOSIS — M1000 Idiopathic gout, unspecified site: Secondary | ICD-10-CM | POA: Diagnosis not present

## 2015-05-28 LAB — URIC ACID: Uric Acid, Serum: 11.1 mg/dL — ABNORMAL HIGH (ref 4.0–7.8)

## 2015-05-28 MED ORDER — PREDNISONE 10 MG PO TABS: ORAL_TABLET | ORAL | Status: DC

## 2015-05-28 MED ORDER — TRAMADOL HCL 50 MG PO TABS: 100.0000 mg | ORAL_TABLET | ORAL | Status: DC | PRN

## 2015-05-28 NOTE — Progress Notes (Signed)
   Subjective:    Patient ID: [name], male    DOB: 11/25/1971, 44 y.o.   MRN: [id number]  HPI Here for 4 days of swelling, warmth, and pain in the right foot. No recent trauma. He says this happens every 3-4 months and it usually lasts 3-5 days before it settles back down. He is elevating the foot and soaking it in Epsom salts. He has had Xrays of the foot which were normal. He has been told by several doctors that this is not gout, but no specific diagnosis has been made.    Review of Systems  Constitutional: Negative.   Musculoskeletal: Positive for joint swelling and arthralgias.       Objective:   Physical Exam  Constitutional:  Morbidly obese, seated in a wheelchair. In pain   Musculoskeletal:  The entire right foot is warm, swollen, pink, and very tender. Most of the tenderness is just anterior to the lateral malleolus.           Assessment & Plan:  This is classic gout based on the presentation. Treat with a predisone taper and Tramadol. Get a uric acid level today.

## 2015-05-28 NOTE — Progress Notes (Signed)
Pre visit review using our clinic review tool, if applicable. No additional management support is needed unless otherwise documented below in the visit note.   Could not get weight today due to pt's swollen foot.

## 2015-05-31 ENCOUNTER — Other Ambulatory Visit: Payer: Self-pay

## 2015-05-31 MED ORDER — ALLOPURINOL 300 MG PO TABS: 300.0000 mg | ORAL_TABLET | Freq: Every day | ORAL | Status: DC

## 2015-06-01 ENCOUNTER — Ambulatory Visit (INDEPENDENT_AMBULATORY_CARE_PROVIDER_SITE_OTHER): Payer: Medicare Other | Admitting: Internal Medicine

## 2015-06-01 ENCOUNTER — Encounter: Payer: Self-pay | Admitting: Internal Medicine

## 2015-06-01 DIAGNOSIS — I10 Essential (primary) hypertension: Secondary | ICD-10-CM | POA: Diagnosis not present

## 2015-06-01 DIAGNOSIS — N184 Chronic kidney disease, stage 4 (severe): Secondary | ICD-10-CM

## 2015-06-01 DIAGNOSIS — M1000 Idiopathic gout, unspecified site: Secondary | ICD-10-CM

## 2015-06-01 DIAGNOSIS — M109 Gout, unspecified: Secondary | ICD-10-CM | POA: Insufficient documentation

## 2015-06-01 MED ORDER — ALLOPURINOL 300 MG PO TABS: 150.0000 mg | ORAL_TABLET | Freq: Every day | ORAL | Status: DC

## 2015-06-01 NOTE — Patient Instructions (Addendum)
Start allopurinol.  2 weeks after you are pain-free  (150 mg daily  1/2 of 300 mg tablet)  After your gout has resolved, take prednisone 10 mg for 3 days then discontinue  Gout Gout is an inflammatory arthritis caused by a buildup of uric acid crystals in the joints. Uric acid is a chemical that is normally present in the blood. When the level of uric acid in the blood is too high it can form crystals that deposit in your joints and tissues. This causes joint redness, soreness, and swelling (inflammation). Repeat attacks are common. Over time, uric acid crystals can form into masses (tophi) near a joint, destroying bone and causing disfigurement. Gout is treatable and often preventable. CAUSES  The disease begins with elevated levels of uric acid in the blood. Uric acid is produced by your body when it breaks down a naturally found substance called purines. Certain foods you eat, such as meats and fish, contain high amounts of purines. Causes of an elevated uric acid level include:  Being passed down from parent to child (heredity).  Diseases that cause increased uric acid production (such as obesity, psoriasis, and certain cancers).  Excessive alcohol use.  Diet, especially diets rich in meat and seafood.  Medicines, including certain cancer-fighting medicines (chemotherapy), water pills (diuretics), and aspirin.  Chronic kidney disease. The kidneys are no longer able to remove uric acid well.  Problems with metabolism. Conditions strongly associated with gout include:  Obesity.  High blood pressure.  High cholesterol.  Diabetes. Not everyone with elevated uric acid levels gets gout. It is not understood why some people get gout and others do not. Surgery, joint injury, and eating too much of certain foods are some of the factors that can lead to gout attacks. SYMPTOMS   An attack of gout comes on quickly. It causes intense pain with redness, swelling, and warmth in a  joint.  Fever can occur.  Often, only one joint is involved. Certain joints are more commonly involved:  Base of the big toe.  Knee.  Ankle.  Wrist.  Finger. Without treatment, an attack usually goes away in a few days to weeks. Between attacks, you usually will not have symptoms, which is different from many other forms of arthritis. DIAGNOSIS  Your caregiver will suspect gout based on your symptoms and exam. In some cases, tests may be recommended. The tests may include:  Blood tests.  Urine tests.  X-rays.  Joint fluid exam. This exam requires a needle to remove fluid from the joint (arthrocentesis). Using a microscope, gout is confirmed when uric acid crystals are seen in the joint fluid. TREATMENT  There are two phases to gout treatment: treating the sudden onset (acute) attack and preventing attacks (prophylaxis).  Treatment of an Acute Attack.  Medicines are used. These include anti-inflammatory medicines or steroid medicines.  An injection of steroid medicine into the affected joint is sometimes necessary.  The painful joint is rested. Movement can worsen the arthritis.  You may use warm or cold treatments on painful joints, depending which works best for you.  Treatment to Prevent Attacks.  If you suffer from frequent gout attacks, your caregiver may advise preventive medicine. These medicines are started after the acute attack subsides. These medicines either help your kidneys eliminate uric acid from your body or decrease your uric acid production. You may need to stay on these medicines for a very long time.  The early phase of treatment with preventive medicine can be associated  with an increase in acute gout attacks. For this reason, during the first few months of treatment, your caregiver may also advise you to take medicines usually used for acute gout treatment. Be sure you understand your caregiver's directions. Your caregiver may make several adjustments  to your medicine dose before these medicines are effective.  Discuss dietary treatment with your caregiver or dietitian. Alcohol and drinks high in sugar and fructose and foods such as meat, poultry, and seafood can increase uric acid levels. Your caregiver or dietitian can advise you on drinks and foods that should be limited. HOME CARE INSTRUCTIONS   Do not take aspirin to relieve pain. This raises uric acid levels.  Only take over-the-counter or prescription medicines for pain, discomfort, or fever as directed by your caregiver.  Rest the joint as much as possible. When in bed, keep sheets and blankets off painful areas.  Keep the affected joint raised (elevated).  Apply warm or cold treatments to painful joints. Use of warm or cold treatments depends on which works best for you.  Use crutches if the painful joint is in your leg.  Drink enough fluids to keep your urine clear or pale yellow. This helps your body get rid of uric acid. Limit alcohol, sugary drinks, and fructose drinks.  Follow your dietary instructions. Pay careful attention to the amount of protein you eat. Your daily diet should emphasize fruits, vegetables, whole grains, and fat-free or low-fat milk products. Discuss the use of coffee, vitamin C, and cherries with your caregiver or dietitian. These may be helpful in lowering uric acid levels.  Maintain a healthy body weight. SEEK MEDICAL CARE IF:   You develop diarrhea, vomiting, or any side effects from medicines.  You do not feel better in 24 hours, or you are getting worse. SEEK IMMEDIATE MEDICAL CARE IF:   Your joint becomes suddenly more tender, and you have chills or a fever. MAKE SURE YOU:   Understand these instructions.  Will watch your condition.  Will get help right away if you are not doing well or get worse. Document Released: 12/08/2000 Document Revised: 04/27/2014 Document Reviewed: 07/24/2012 [facility] Patient Information 2015 [place], [place].  This information is not intended to replace advice given to you by your health care provider. Make sure you discuss any questions you have with your health care provider. Low-Purine Diet Purines are compounds that affect the level of uric acid in your body. A low-purine diet is a diet that is low in purines. Eating a low-purine diet can prevent the level of uric acid in your body from getting too high and causing gout or kidney stones or both. WHAT DO I NEED TO KNOW ABOUT THIS DIET?  Choose low-purine foods. Examples of low-purine foods are listed in the next section.  Drink plenty of fluids, especially water. Fluids can help remove uric acid from your body. Try to drink 8-16 cups (1.9-3.8 L) a day.  Limit foods high in fat, especially saturated fat, as fat makes it harder for the body to get rid of uric acid. Foods high in saturated fat include pizza, cheese, ice cream, whole milk, fried foods, and gravies. Choose foods that are lower in fat and lean sources of protein. Use olive oil when cooking as it contains healthy fats that are not high in saturated fat.  Limit alcohol. Alcohol interferes with the elimination of uric acid from your body. If you are having a gout attack, avoid all alcohol.  Keep in mind that different  people's bodies react differently to different foods. You will probably learn over time which foods do or do not affect you. If you discover that a food tends to cause your gout to flare up, avoid eating that food. You can more freely enjoy foods that do not cause problems. If you have any questions about a food item, talk to your dietitian or health care provider. WHICH FOODS ARE LOW, MODERATE, AND HIGH IN PURINES? The following is a list of foods that are low, moderate, and high in purines. You can eat any amount of the foods that are low in purines. You may be able to have small amounts of foods that are moderate in purines. Ask your health care provider how much of a food  moderate in purines you can have. Avoid foods high in purines. Grains  Foods low in purines: Enriched white bread, pasta, rice, cake, cornbread, popcorn.  Foods moderate in purines: Whole-grain breads and cereals, wheat germ, bran, oatmeal. Uncooked oatmeal. Dry wheat bran or wheat germ.  Foods high in purines: Pancakes, [place] toast, biscuits, muffins. Vegetables  Foods low in purines: All vegetables, except those that are moderate in purines.  Foods moderate in purines: Asparagus, cauliflower, spinach, mushrooms, green peas. Fruits  All fruits are low in purines. Meats and other Protein Foods  Foods low in purines: Eggs, nuts, peanut butter.  Foods moderate in purines: 80-90% lean beef, lamb, veal, pork, poultry, fish, eggs, peanut butter, nuts. Crab, lobster, oysters, and shrimp. Cooked dried beans, peas, and lentils.  Foods high in purines: Anchovies, sardines, herring, mussels, tuna, codfish, scallops, trout, and haddock. Berniece Salines. Organ meats (such as liver or kidney). Tripe. Game meat. Goose. Sweetbreads. Dairy  All dairy foods are low in purines. Low-fat and fat-free dairy products are best because they are low in saturated fat. Beverages  Drinks low in purines: Water, carbonated beverages, tea, coffee, cocoa.  Drinks moderate in purines: Soft drinks and other drinks sweetened with high-fructose corn syrup. Juices. To find whether a food or drink is sweetened with high-fructose corn syrup, look at the ingredients list.  Drinks high in purines: Alcoholic beverages (such as beer). Condiments  Foods low in purines: Salt, herbs, olives, pickles, relishes, vinegar.  Foods moderate in purines: Butter, margarine, oils, mayonnaise. Fats and Oils  Foods low in purines: All types, except gravies and sauces made with meat.  Foods high in purines: Gravies and sauces made with meat. Other Foods  Foods low in purines: Sugars, sweets, gelatin. Cake. Soups made without  meat.  Foods moderate in purines: Meat-based or fish-based soups, broths, or bouillons. Foods and drinks sweetened with high-fructose corn syrup.  Foods high in purines: High-fat desserts (such as ice cream, cookies, cakes, pies, doughnuts, and chocolate). Contact your dietitian for more information on foods that are not listed here. Document Released: 04/07/2011 Document Revised: 12/16/2013 Document Reviewed: 11/17/2013 [facility] Patient Information 2015 [place], [place]. This information is not intended to replace advice given to you by your health care provider. Make sure you discuss any questions you have with your health care provider.

## 2015-06-01 NOTE — Progress Notes (Signed)
Pre visit review using our clinic review tool, if applicable. No additional management support is needed unless otherwise documented below in the visit note. 

## 2015-06-01 NOTE — Progress Notes (Signed)
Subjective:    Patient ID: [name], male    DOB: 1971-09-19, 44 y.o.   MRN: [id number]  HPI  44 year old patient who was seen last week with pain and swelling involving the right foot;  pain was quite severe to the point where he was nonambulatory.  He was placed on a prednisone taper due to suspected gout assessment uric acid level was quite high.  He has significant chronic kidney disease.  No prior history of gout.  Pain is considerably better, but not resolved  Past Medical History  Diagnosis Date  . Chronic kidney disease   . History of pulmonary embolus (PE) 2010  . 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 Left ventricular hypertrophy   . Abdominal aortic aneurysm   . Stroke 2009    pt says no stroke  . Hypertension     dr [name]  . Anxiety   . DVT (deep venous thrombosis)   . Obesity   . Chronic renal disease     advanced  . Aortic regurgitation 12/10/08    Echo: mild to mod. AOV regurg,mild AO root dilatation, LA mildly dilated, EF 65%, LV wall thickness markedly increased, small PE  . Anemia     History   Social History  . Marital Status: Married    Spouse Name: N/A  . Number of Children: N/A  . Years of Education: N/A   Occupational History  . Not on file.   Social History Main Topics  . Smoking status: Never Smoker   . Smokeless tobacco: Never Used  . Alcohol Use: No  . Drug Use: No  . Sexual Activity:    Partners: Female   Other Topics Concern  . Not on file   Social History Narrative    Past Surgical History  Procedure Laterality Date  . Cardiac catheterization  02/11/10    false + Nuc  . Corneal transplant    . Intravascular ultrasound  09/25/2012    Procedure: INTRAVASCULAR ULTRASOUND;  Surgeon: Mal [name], MD;  Location: [facility] OR;  Service: Vascular;  Laterality: N/A;  . Abdominal aortic aneurysm repair  09/25/2012    EVAR  . Abdominal aortagram N/A 02/06/2012    Procedure: ABDOMINAL Maxcine Ham;   Surgeon: [name], MD;  Location: [facility] CATH LAB;  Service: Cardiovascular;  Laterality: N/A;  . Av fistula placement Left 05/19/2015    Procedure: ARTERIOVENOUS (AV) FISTULA CREATION-LEFT RADIOCEPHALIC;  Surgeon: Mal [name], MD;  Location: [facility] OR;  Service: Vascular;  Laterality: Left;    Family History  Problem Relation Age of Onset  . Hypertension Mother   . Hypertension Father     Allergies  Allergen Reactions  . Roxicodone [Oxycodone Hcl] Itching    Hives and itching all over    Current Outpatient Prescriptions on File Prior to Visit  Medication Sig Dispense Refill  . allopurinol (ZYLOPRIM) 300 MG tablet Take 1 tablet (300 mg total) by mouth daily. 90 tablet 0  . apixaban (ELIQUIS) 2.5 MG TABS tablet Take 1 tablet (2.5 mg total) by mouth 2 (two) times daily. 180 tablet 0  . atorvastatin (LIPITOR) 10 MG tablet TAKE 1 TABLET (10 MG TOTAL) BY MOUTH DAILY. 90 tablet 1  . calcitRIOL (ROCALTROL) 0.25 MCG capsule Take 1 capsule (0.25 mcg total) by mouth every Monday, Wednesday, and Friday. 63 capsule 1  . carvedilol (COREG) 25 MG tablet Take 1 tablet (25 mg total) by mouth 2 (two) times daily.  180 tablet 3  . furosemide (LASIX) 40 MG tablet Take 1 tablet by mouth  daily 90 tablet 0  . Olmesartan-Amlodipine-HCTZ 40-10-12.5 MG TABS Take 1 tablet by mouth daily. 90 tablet 2  . predniSONE (DELTASONE) 10 MG tablet Take 4 pills a day for 4 days, then 3 a day for 4 days, then 2 a day for 4 days, then 1 a day for 4 days, then stop 60 tablet 0  . traMADol (ULTRAM) 50 MG tablet Take 2 tablets (100 mg total) by mouth every 4 (four) hours as needed for moderate pain. 60 tablet 0   No current facility-administered medications on file prior to visit.    BP 112/70 mmHg  Pulse 68  Temp(Src) 97.7 F (36.5 C) (Oral)  Resp 20  Ht 6' (1.829 m)  Wt 362 lb 8 oz (164.429 kg)  BMI 49.15 kg/m2  SpO2 96%    Review of Systems  Musculoskeletal: Positive for joint swelling and arthralgias.        Objective:   Physical Exam  Constitutional:  Morbidly obese Blood pressure low normal  Musculoskeletal:  Minimal pain, soft tissue swelling right foot          Assessment & Plan:   Probable resolving gouty arthritis.  We'll rapidly taper prednisone when pain free. Start allopurinol 2-3 weeks after patient is pain-free Patient given information concerning gout and a low purine diet  Weight loss encouraged

## 2015-06-22 ENCOUNTER — Encounter: Payer: Self-pay | Admitting: Vascular Surgery

## 2015-06-22 ENCOUNTER — Telehealth: Payer: Self-pay

## 2015-06-22 ENCOUNTER — Ambulatory Visit (INDEPENDENT_AMBULATORY_CARE_PROVIDER_SITE_OTHER): Payer: Self-pay | Admitting: Vascular Surgery

## 2015-06-22 VITALS — BP 131/88 | HR 72 | Resp 16 | Ht 72.0 in | Wt 356.0 lb

## 2015-06-22 DIAGNOSIS — N186 End stage renal disease: Secondary | ICD-10-CM

## 2015-06-22 NOTE — Telephone Encounter (Signed)
Phone call from pt.  Reported "red blisters" around the top and bottom of the incision; reported this occurred about 1 week ago.  Denied any adhesive used in area of blistering.  Stated the incision is intact; no drainage.  Denied fever/ chills.  Reported he uses Vaseline on his skin after he showers.  Stated he began applying Neosporin 2 days ago, to the blistered area, with no improvement noted.  Appt. given today at 2:00 PM for incision check.  Agrees with plan.

## 2015-06-22 NOTE — Progress Notes (Signed)
Subjective:     Patient ID: [name], male   DOB: March 23, 1971, 44 y.o.   MRN: [id number]  HPI this 44 year old male had left radial-cephalic AV fistula created by me on 05/19/2015. He has developed some blistering around the left forearm incision and is seen today in evaluation. He denies any other skin rash on the rest of his body. It does have a slight itch.   Review of Systems     Objective:   Physical Exam BP 131/88 mmHg  Pulse 72  Resp 16  Ht 6' (1.829 m)  Wt 356 lb (161.481 kg)  BMI 48.27 kg/m2  General well-developed obese male in no apparent distress alert and oriented 3 Left radial cephalic AV fistula is patent with excellent pulse and palpable thrill. Incision is essentially healed with slight separation in the midportion about 2 mm. Maculopapular rash surrounding this with no pustules or blistering noted.     Assessment:     Skin rash surrounding incision left radial cephalic AV fistula-etiology unknown    Plan:     I've recommended Cortaid ointment topically applied twice daily. Return July 19 as scheduled for duplex scan of fistula If rash progresses patient may need to see dermatologist

## 2015-07-09 ENCOUNTER — Encounter: Payer: Self-pay | Admitting: Vascular Surgery

## 2015-07-13 ENCOUNTER — Ambulatory Visit (INDEPENDENT_AMBULATORY_CARE_PROVIDER_SITE_OTHER): Payer: Self-pay | Admitting: Vascular Surgery

## 2015-07-13 ENCOUNTER — Other Ambulatory Visit: Payer: Self-pay

## 2015-07-13 ENCOUNTER — Ambulatory Visit (HOSPITAL_COMMUNITY)
Admission: RE | Admit: 2015-07-13 | Discharge: 2015-07-13 | Disposition: A | Discharge status: Home / Self Care | Payer: BLUE CROSS/BLUE SHIELD | Source: Ambulatory Visit | Attending: Vascular Surgery | Admitting: Vascular Surgery

## 2015-07-13 ENCOUNTER — Encounter: Payer: Self-pay | Admitting: Vascular Surgery

## 2015-07-13 VITALS — BP 125/82 | HR 67 | Ht 72.0 in | Wt 362.9 lb

## 2015-07-13 DIAGNOSIS — N186 End stage renal disease: Secondary | ICD-10-CM | POA: Insufficient documentation

## 2015-07-13 DIAGNOSIS — Z4931 Encounter for adequacy testing for hemodialysis: Secondary | ICD-10-CM | POA: Diagnosis not present

## 2015-07-13 DIAGNOSIS — N184 Chronic kidney disease, stage 4 (severe): Secondary | ICD-10-CM

## 2015-07-13 NOTE — Progress Notes (Signed)
Subjective:     Patient ID: [name], male   DOB: July 16, 1971, 44 y.o.   MRN: [id number]  HPI This 44 year old male returns for continued follow-up regarding his left radial-cephalic AV fistula which recreated 05/19/2015. He has stage IV chronic kidney disease and is not yet on hemodialysis. He will be seeing Dr. [name] in September. He denies any pain or numbness in the left hand. The rash in the left arm has resolved.   Past Medical History  Diagnosis Date  . Chronic kidney disease   . History of pulmonary embolus (PE) 2010  . 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 Left ventricular hypertrophy   . Abdominal aortic aneurysm   . Stroke 2009    pt says no stroke  . Hypertension     dr [name]  . Anxiety   . DVT (deep venous thrombosis)   . Obesity   . Chronic renal disease     advanced  . Aortic regurgitation 12/10/08    Echo: mild to mod. AOV regurg,mild AO root dilatation, LA mildly dilated, EF 65%, LV wall thickness markedly increased, small PE  . Anemia     History  Substance Use Topics  . Smoking status: Never Smoker   . Smokeless tobacco: Never Used  . Alcohol Use: No    Family History  Problem Relation Age of Onset  . Hypertension Mother   . Hypertension Father     Allergies  Allergen Reactions  . Roxicodone [Oxycodone Hcl] Itching    Hives and itching all over     Current outpatient prescriptions:  .  allopurinol (ZYLOPRIM) 300 MG tablet, Take 0.5 tablets (150 mg total) by mouth daily., Disp: 90 tablet, Rfl: 0 .  apixaban (ELIQUIS) 2.5 MG TABS tablet, Take 1 tablet (2.5 mg total) by mouth 2 (two) times daily., Disp: 180 tablet, Rfl: 0 .  atorvastatin (LIPITOR) 10 MG tablet, TAKE 1 TABLET (10 MG TOTAL) BY MOUTH DAILY., Disp: 90 tablet, Rfl: 1 .  calcitRIOL (ROCALTROL) 0.25 MCG capsule, Take 1 capsule (0.25 mcg total) by mouth every Monday, Wednesday, and Friday., Disp: 63 capsule, Rfl: 1 .  carvedilol (COREG) 25 MG tablet,  Take 1 tablet (25 mg total) by mouth 2 (two) times daily., Disp: 180 tablet, Rfl: 3 .  furosemide (LASIX) 40 MG tablet, Take 1 tablet by mouth  daily, Disp: 90 tablet, Rfl: 0 .  Olmesartan-Amlodipine-HCTZ 40-10-12.5 MG TABS, Take 1 tablet by mouth daily., Disp: 90 tablet, Rfl: 2 .  predniSONE (DELTASONE) 10 MG tablet, Take 4 pills a day for 4 days, then 3 a day for 4 days, then 2 a day for 4 days, then 1 a day for 4 days, then stop (Patient not taking: Reported on 06/22/2015), Disp: 60 tablet, Rfl: 0 .  traMADol (ULTRAM) 50 MG tablet, Take 2 tablets (100 mg total) by mouth every 4 (four) hours as needed for moderate pain. (Patient not taking: Reported on 06/22/2015), Disp: 60 tablet, Rfl: 0  Filed Vitals:   07/13/15 1030  BP: 125/82  Pulse: 67  Height: 6' (1.829 m)  Weight: 362 lb 14.4 oz (164.61 kg)  SpO2: 97%    Body mass index is 49.21 kg/(m^2).           Review of Systems Denies active chest pain, dyspnea on exertion, PND, orthopnea. Does have chronic obesity. Has history of aortic dissection an infrarenal abdominal aortic aneurysm.     Objective:   Physical Exam BP  125/82 mmHg  Pulse 67  Ht 6' (1.829 m)  Wt 362 lb 14.4 oz (164.61 kg)  BMI 49.21 kg/m2  SpO2 97%  . General obese male in no apparent distress alert and oriented 3  lungs no rhonchi or wheezing Cardiovascular regular rhythm no murmurs Abdomen obese  left upper extremity with radial -cephalic AV fistula with good pulse and palpable thrill. Vein is somewhat deep in the arm. Good healing of distal incision.    I ordered a duplex scan of the fistula which reveals excellent flow through the fistula. The vein is somewhat deep around 0.77 cm in its deepest location  And there are 3 competing branches in the forearm.     Assessment:      stage IV chronic kidney disease with slowly developing left radial cephalic AV fistula which is located deep in the arm and has 3 competing branches     Plan:      plan  ligation of competing branches and superficial was a fistula on Thursday of this week- July 21  Hopefully this will allow satisfactory development and maturation of this fistula

## 2015-07-14 ENCOUNTER — Telehealth: Payer: Self-pay

## 2015-07-14 ENCOUNTER — Encounter (HOSPITAL_COMMUNITY): Payer: Self-pay | Admitting: *Deleted

## 2015-07-14 NOTE — Progress Notes (Signed)
Pt denies SOB and chest pain. Pt is under the care of Dr. Sallyanne Kuster, cardiology.  Pt stated last dose of Eliquis was 07/13/15 as instructed. Pt made aware to stop NSAID's , otc vitamins, and herbal medications. Pt verbalized understanding of all pre-op instructions.

## 2015-07-14 NOTE — Telephone Encounter (Signed)
[name] called at 12:03 pm today wanting to reschedule surgery for 07/15/15. After speaking with patient, the decision was made to reschedule to Wednesday, 08/04/15. At 1:25 pm, [name] called back to cancel surgery on 08/04/15. Patient states he does not want to reschedule at this time due to the uncertainty of his wife's schedule. Patient states he will call our office back to reschedule.

## 2015-07-14 NOTE — Progress Notes (Signed)
   07/14/15 1126  OBSTRUCTIVE SLEEP APNEA  Have you ever been diagnosed with sleep apnea through a sleep study? No  Do you snore loudly (loud enough to be heard through closed doors)?  1  Do you often feel tired, fatigued, or sleepy during the daytime? 1  Has anyone observed you stop breathing during your sleep? 1  Do you have, or are you being treated for high blood pressure? 1  BMI more than 35 kg/m2? 1  Age over 46 years old? 0  Gender: 1  Obstructive Sleep Apnea Score 6

## 2015-07-16 ENCOUNTER — Other Ambulatory Visit: Payer: Self-pay

## 2015-07-29 ENCOUNTER — Other Ambulatory Visit: Payer: Self-pay | Admitting: Cardiovascular Disease

## 2015-08-03 ENCOUNTER — Encounter (HOSPITAL_COMMUNITY): Payer: Self-pay | Admitting: *Deleted

## 2015-08-03 NOTE — Progress Notes (Signed)
Pt denies any recent chest pain or sob. No changes in medications. Pt states last dose of Eliquis was 07/31/15.

## 2015-08-04 ENCOUNTER — Encounter (HOSPITAL_COMMUNITY)
Admission: RE | Disposition: A | Discharge status: Home / Self Care | Payer: Self-pay | Source: Ambulatory Visit | Attending: Vascular Surgery

## 2015-08-04 ENCOUNTER — Ambulatory Visit (HOSPITAL_COMMUNITY): Payer: BLUE CROSS/BLUE SHIELD | Admitting: Certified Registered Nurse Anesthetist

## 2015-08-04 ENCOUNTER — Other Ambulatory Visit: Payer: Medicare Other | Admitting: *Deleted

## 2015-08-04 ENCOUNTER — Ambulatory Visit (HOSPITAL_COMMUNITY)
Admission: RE | Admit: 2015-08-04 | Discharge: 2015-08-04 | Disposition: A | Discharge status: Home / Self Care | Payer: BLUE CROSS/BLUE SHIELD | Source: Ambulatory Visit | Attending: Vascular Surgery | Admitting: Vascular Surgery

## 2015-08-04 ENCOUNTER — Encounter (HOSPITAL_COMMUNITY): Payer: Self-pay | Admitting: General Practice

## 2015-08-04 DIAGNOSIS — M184 Other bilateral secondary osteoarthritis of first carpometacarpal joints: Secondary | ICD-10-CM | POA: Diagnosis not present

## 2015-08-04 DIAGNOSIS — I129 Hypertensive chronic kidney disease with stage 1 through stage 4 chronic kidney disease, or unspecified chronic kidney disease: Secondary | ICD-10-CM | POA: Insufficient documentation

## 2015-08-04 DIAGNOSIS — Z86711 Personal history of pulmonary embolism: Secondary | ICD-10-CM | POA: Insufficient documentation

## 2015-08-04 DIAGNOSIS — F419 Anxiety disorder, unspecified: Secondary | ICD-10-CM | POA: Insufficient documentation

## 2015-08-04 DIAGNOSIS — Z4931 Encounter for adequacy testing for hemodialysis: Secondary | ICD-10-CM

## 2015-08-04 DIAGNOSIS — T82898A Other specified complication of vascular prosthetic devices, implants and grafts, initial encounter: Secondary | ICD-10-CM | POA: Diagnosis not present

## 2015-08-04 DIAGNOSIS — N186 End stage renal disease: Secondary | ICD-10-CM

## 2015-08-04 DIAGNOSIS — N184 Chronic kidney disease, stage 4 (severe): Secondary | ICD-10-CM | POA: Diagnosis not present

## 2015-08-04 HISTORY — PX: FISTULA SUPERFICIALIZATION: SHX6341

## 2015-08-04 HISTORY — PX: LIGATION OF COMPETING BRANCHES OF ARTERIOVENOUS FISTULA: SHX5949

## 2015-08-04 LAB — POCT I-STAT 4, (NA,K, GLUC, HGB,HCT)
GLUCOSE: 111 mg/dL — AB (ref 65–99)
HEMATOCRIT: 36 % — AB (ref 39.0–52.0)
Hemoglobin: 12.2 g/dL — ABNORMAL LOW (ref 13.0–17.0)
Potassium: 3.6 mmol/L (ref 3.5–5.1)
Sodium: 140 mmol/L (ref 135–145)

## 2015-08-04 LAB — PROTIME-INR
INR: 1.09 (ref 0.00–1.49)
Prothrombin Time: 14.3 seconds (ref 11.6–15.2)

## 2015-08-04 SURGERY — FISTULA SUPERFICIALIZATION
Anesthesia: Monitor Anesthesia Care | Site: Arm Lower | Laterality: Left

## 2015-08-04 SURGERY — FISTULA SUPERFICIALIZATION
Anesthesia: Choice | Laterality: Left

## 2015-08-04 MED ORDER — ONDANSETRON HCL 4 MG/2ML IJ SOLN
INTRAMUSCULAR | Status: AC
  Filled 2015-08-04: qty 2

## 2015-08-04 MED ORDER — LIDOCAINE HCL (PF) 1 % IJ SOLN
INTRAMUSCULAR | Status: AC
  Filled 2015-08-04: qty 60

## 2015-08-04 MED ORDER — 0.9 % SODIUM CHLORIDE (POUR BTL) OPTIME
TOPICAL | Status: DC | PRN
  Administered 2015-08-04: 1000 mL

## 2015-08-04 MED ORDER — MIDAZOLAM HCL 2 MG/2ML IJ SOLN
INTRAMUSCULAR | Status: AC
  Filled 2015-08-04: qty 4

## 2015-08-04 MED ORDER — FENTANYL CITRATE (PF) 100 MCG/2ML IJ SOLN: 25.0000 ug | INTRAMUSCULAR | Status: DC | PRN

## 2015-08-04 MED ORDER — CEFUROXIME SODIUM 1.5 G IJ SOLR
1.5000 g | INTRAMUSCULAR | Status: DC
  Filled 2015-08-04: qty 1.5

## 2015-08-04 MED ORDER — PROPOFOL INFUSION 10 MG/ML OPTIME
INTRAVENOUS | Status: DC | PRN
  Administered 2015-08-04: 65 ug/kg/min via INTRAVENOUS

## 2015-08-04 MED ORDER — ONDANSETRON HCL 4 MG/2ML IJ SOLN: 4.0000 mg | Freq: Once | INTRAMUSCULAR | Status: DC | PRN

## 2015-08-04 MED ORDER — LIDOCAINE-EPINEPHRINE (PF) 1 %-1:200000 IJ SOLN
INTRAMUSCULAR | Status: DC | PRN
  Administered 2015-08-04: 28 mL

## 2015-08-04 MED ORDER — CHLORHEXIDINE GLUCONATE CLOTH 2 % EX PADS: 6.0000 | MEDICATED_PAD | Freq: Once | CUTANEOUS | Status: DC

## 2015-08-04 MED ORDER — SODIUM CHLORIDE 0.9 % IV SOLN
INTRAVENOUS | Status: DC | PRN
  Administered 2015-08-04: 11:00:00 via INTRAVENOUS

## 2015-08-04 MED ORDER — SODIUM CHLORIDE 0.9 % IV SOLN
INTRAVENOUS | Status: DC
  Administered 2015-08-04: 09:00:00 via INTRAVENOUS

## 2015-08-04 MED ORDER — TRAMADOL HCL 50 MG PO TABS: 100.0000 mg | ORAL_TABLET | ORAL | Status: DC | PRN

## 2015-08-04 MED ORDER — FENTANYL CITRATE (PF) 100 MCG/2ML IJ SOLN
INTRAMUSCULAR | Status: DC | PRN
  Administered 2015-08-04: 150 ug via INTRAVENOUS

## 2015-08-04 MED ORDER — MIDAZOLAM HCL 5 MG/5ML IJ SOLN
INTRAMUSCULAR | Status: DC | PRN
  Administered 2015-08-04: 4 mg via INTRAVENOUS

## 2015-08-04 MED ORDER — FENTANYL CITRATE (PF) 250 MCG/5ML IJ SOLN
INTRAMUSCULAR | Status: AC
  Filled 2015-08-04: qty 5

## 2015-08-04 MED ORDER — PROPOFOL 10 MG/ML IV BOLUS
INTRAVENOUS | Status: AC
  Filled 2015-08-04: qty 20

## 2015-08-04 MED ORDER — LIDOCAINE HCL (CARDIAC) 20 MG/ML IV SOLN
INTRAVENOUS | Status: AC
  Filled 2015-08-04: qty 5

## 2015-08-04 SURGICAL SUPPLY — 34 items
CANISTER SUCTION 2500CC (MISCELLANEOUS) ×2 IMPLANT
CLIP TI MEDIUM 6 (CLIP) ×2 IMPLANT
CLIP TI WIDE RED SMALL 6 (CLIP) ×3 IMPLANT
COVER PROBE W GEL 5X96 (DRAPES) IMPLANT
ELECT REM PT RETURN 9FT ADLT (ELECTROSURGICAL) ×2
ELECTRODE REM PT RTRN 9FT ADLT (ELECTROSURGICAL) ×1 IMPLANT
GAUZE SPONGE 4X4 12PLY STRL (GAUZE/BANDAGES/DRESSINGS) ×2 IMPLANT
GEL ULTRASOUND 20GR AQUASONIC (MISCELLANEOUS) IMPLANT
GLOVE BIO SURGEON STRL SZ 6.5 (GLOVE) ×2 IMPLANT
GLOVE BIO SURGEON STRL SZ7.5 (GLOVE) ×1 IMPLANT
GLOVE BIOGEL PI IND STRL 6.5 (GLOVE) IMPLANT
GLOVE BIOGEL PI IND STRL 8 (GLOVE) IMPLANT
GLOVE BIOGEL PI INDICATOR 6.5 (GLOVE) ×1
GLOVE BIOGEL PI INDICATOR 8 (GLOVE) ×1
GLOVE ECLIPSE 6.5 STRL STRAW (GLOVE) ×2 IMPLANT
GLOVE SS BIOGEL STRL SZ 7 (GLOVE) ×1 IMPLANT
GLOVE SUPERSENSE BIOGEL SZ 7 (GLOVE) ×1
GLOVE SURG SS PI 6.5 STRL IVOR (GLOVE) ×1 IMPLANT
GOWN STRL REUS W/ TWL LRG LVL3 (GOWN DISPOSABLE) ×3 IMPLANT
GOWN STRL REUS W/TWL LRG LVL3 (GOWN DISPOSABLE) ×6
KIT BASIN OR (CUSTOM PROCEDURE TRAY) ×2 IMPLANT
KIT ROOM TURNOVER OR (KITS) ×2 IMPLANT
LIQUID BAND (GAUZE/BANDAGES/DRESSINGS) ×2 IMPLANT
NS IRRIG 1000ML POUR BTL (IV SOLUTION) ×2 IMPLANT
PACK CV ACCESS (CUSTOM PROCEDURE TRAY) ×2 IMPLANT
PAD ARMBOARD 7.5X6 YLW CONV (MISCELLANEOUS) ×4 IMPLANT
SUT PROLENE 6 0 BV (SUTURE) ×2 IMPLANT
SUT SILK 0 TIES 10X30 (SUTURE) ×2 IMPLANT
SUT VIC AB 3-0 SH 27 (SUTURE) ×10
SUT VIC AB 3-0 SH 27X BRD (SUTURE) ×1 IMPLANT
SWAB COLLECTION DEVICE MRSA (MISCELLANEOUS) IMPLANT
TUBE ANAEROBIC SPECIMEN COL (MISCELLANEOUS) IMPLANT
UNDERPAD 30X30 INCONTINENT (UNDERPADS AND DIAPERS) ×2 IMPLANT
WATER STERILE IRR 1000ML POUR (IV SOLUTION) ×2 IMPLANT

## 2015-08-04 NOTE — Anesthesia Procedure Notes (Addendum)
Procedure Name: MAC Date/Time: 08/04/2015 11:00 AM Performed by: Trixie Deis A Pre-anesthesia Checklist: Patient identified, Timeout performed, Emergency Drugs available, Suction available and Patient being monitored Patient Re-evaluated:Patient Re-evaluated prior to inductionOxygen Delivery Method: Simple face mask Ventilation: Nasal airway inserted- appropriate to patient size Placement Confirmation: positive ETCO2 Dental Injury: Teeth and Oropharynx as per pre-operative assessment  Comments: Size 8.5 nasal trumpet used in right nare

## 2015-08-04 NOTE — Interval H&P Note (Signed)
History and Physical Interval Note:  08/04/2015 8:40 AM  [name]  has presented today for surgery, with the diagnosis of Stage IV Chronic Kidney Disease N18.4  The various methods of treatment have been discussed with the patient and family. After consideration of risks, benefits and other options for treatment, the patient has consented to  Procedure(s): FISTULA SUPERFICIALIZATION (Left) LIGATION OF COMPETING BRANCHES OF ARTERIOVENOUS FISTULA (Left) as a surgical intervention .  The patient's history has been reviewed, patient examined, no change in status, stable for surgery.  I have reviewed the patient's chart and labs.  Questions were answered to the patient's satisfaction.     [name]

## 2015-08-04 NOTE — H&P (View-Only) (Signed)
Subjective:     Patient ID: [name], male   DOB: Feb 19, 1971, 44 y.o.   MRN: [id number]  HPI This 44 year old male returns for continued follow-up regarding his left radial-cephalic AV fistula which recreated 05/19/2015. He has stage IV chronic kidney disease and is not yet on hemodialysis. He will be seeing Dr. [name] in September. He denies any pain or numbness in the left hand. The rash in the left arm has resolved.   Past Medical History  Diagnosis Date  . Chronic kidney disease   . History of pulmonary embolus (PE) 2010  . 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 Left ventricular hypertrophy   . Abdominal aortic aneurysm   . Stroke 2009    pt says no stroke  . Hypertension     dr [name]  . Anxiety   . DVT (deep venous thrombosis)   . Obesity   . Chronic renal disease     advanced  . Aortic regurgitation 12/10/08    Echo: mild to mod. AOV regurg,mild AO root dilatation, LA mildly dilated, EF 65%, LV wall thickness markedly increased, small PE  . Anemia     History  Substance Use Topics  . Smoking status: Never Smoker   . Smokeless tobacco: Never Used  . Alcohol Use: No    Family History  Problem Relation Age of Onset  . Hypertension Mother   . Hypertension Father     Allergies  Allergen Reactions  . Roxicodone [Oxycodone Hcl] Itching    Hives and itching all over     Current outpatient prescriptions:  .  allopurinol (ZYLOPRIM) 300 MG tablet, Take 0.5 tablets (150 mg total) by mouth daily., Disp: 90 tablet, Rfl: 0 .  apixaban (ELIQUIS) 2.5 MG TABS tablet, Take 1 tablet (2.5 mg total) by mouth 2 (two) times daily., Disp: 180 tablet, Rfl: 0 .  atorvastatin (LIPITOR) 10 MG tablet, TAKE 1 TABLET (10 MG TOTAL) BY MOUTH DAILY., Disp: 90 tablet, Rfl: 1 .  calcitRIOL (ROCALTROL) 0.25 MCG capsule, Take 1 capsule (0.25 mcg total) by mouth every Monday, Wednesday, and Friday., Disp: 63 capsule, Rfl: 1 .  carvedilol (COREG) 25 MG tablet,  Take 1 tablet (25 mg total) by mouth 2 (two) times daily., Disp: 180 tablet, Rfl: 3 .  furosemide (LASIX) 40 MG tablet, Take 1 tablet by mouth  daily, Disp: 90 tablet, Rfl: 0 .  Olmesartan-Amlodipine-HCTZ 40-10-12.5 MG TABS, Take 1 tablet by mouth daily., Disp: 90 tablet, Rfl: 2 .  predniSONE (DELTASONE) 10 MG tablet, Take 4 pills a day for 4 days, then 3 a day for 4 days, then 2 a day for 4 days, then 1 a day for 4 days, then stop (Patient not taking: Reported on 06/22/2015), Disp: 60 tablet, Rfl: 0 .  traMADol (ULTRAM) 50 MG tablet, Take 2 tablets (100 mg total) by mouth every 4 (four) hours as needed for moderate pain. (Patient not taking: Reported on 06/22/2015), Disp: 60 tablet, Rfl: 0  Filed Vitals:   07/13/15 1030  BP: 125/82  Pulse: 67  Height: 6' (1.829 m)  Weight: 362 lb 14.4 oz (164.61 kg)  SpO2: 97%    Body mass index is 49.21 kg/(m^2).           Review of Systems Denies active chest pain, dyspnea on exertion, PND, orthopnea. Does have chronic obesity. Has history of aortic dissection an infrarenal abdominal aortic aneurysm.     Objective:   Physical Exam BP  125/82 mmHg  Pulse 67  Ht 6' (1.829 m)  Wt 362 lb 14.4 oz (164.61 kg)  BMI 49.21 kg/m2  SpO2 97%  . General obese male in no apparent distress alert and oriented 3  lungs no rhonchi or wheezing Cardiovascular regular rhythm no murmurs Abdomen obese  left upper extremity with radial -cephalic AV fistula with good pulse and palpable thrill. Vein is somewhat deep in the arm. Good healing of distal incision.    I ordered a duplex scan of the fistula which reveals excellent flow through the fistula. The vein is somewhat deep around 0.77 cm in its deepest location  And there are 3 competing branches in the forearm.     Assessment:      stage IV chronic kidney disease with slowly developing left radial cephalic AV fistula which is located deep in the arm and has 3 competing branches     Plan:      plan  ligation of competing branches and superficial was a fistula on Thursday of this week- July 21  Hopefully this will allow satisfactory development and maturation of this fistula

## 2015-08-04 NOTE — Discharge Instructions (Signed)
° ° °  08/04/2015 ELIN ZAKOWSKI [id number] 08/23/1971  Surgeon(s): Mal [name], MD  Procedure(s): FISTULA SUPERFICIALIZATION LEFT RADIAL CEPHALIC LIGATION OF MULTIPLE COMPETING BRANCHES OF ARTERIOVENOUS FISTULA  x Do not stick graft for 6 weeks

## 2015-08-04 NOTE — Op Note (Signed)
OPERATIVE REPORT  Date of Surgery: 08/04/2015  Surgeon: [name], MD  Assistant: Dionicio Stall  Pre-op Diagnosis:  Stage IV Chronic Kidney Disease N18.4  Post-op Diagnosis: Stage IV Chronic Kidney Disease N18.4  Procedure: Procedure(s): FISTULA SUPERFICIALIZATION LEFT RADIAL CEPHALIC LIGATION OF MULTIPLE COMPETING BRANCHES OF ARTERIOVENOUS FISTULA  Anesthesia: Mac  EBL: Minimal  Complications: None  Procedure Details: The patient was taken to the operating and placed in supine position at which time the left upper extremity was prepped with Betadine scrub and solution draped in routine sterile manner. After infiltration with 1% Xylocaine with out epinephrine 2 longitudinal incisions were made over the radial cephalic fistula beginning in the distal forearm extending up to near the antecubital area. There was a skip area between these incisions about 5 cm in length. Vein was carefully dissected free distally there was a large branch extending laterally was ligated with 3-0 silk ties. 2 other significant branches were ligated with 3-0 silk ties and divided. The size of the vein was quite adequate. After mobilizing it circumflex and freshly from the wrist to the antecubital area the subcutaneous tissue deep was approximated with continuous 30 Vicryls to superficialize the fistula. After completion of this the skin was closed in a subcuticular fashion with 30 Vicryls over the fistula with the fistula being immediately beneath the closure. There was a good pulse and excellent thrill in the fistula with completion of the procedure. Dermabond was used after the subcuticular closure and sterile dressing applied patient taken to the recovery room in satisfactory condition   [name], MD 08/04/2015 12:14 PM

## 2015-08-04 NOTE — Anesthesia Postprocedure Evaluation (Signed)
  Anesthesia Post-op Note  Patient: [name]  Procedure(s) Performed: Procedure(s) (LRB): FISTULA SUPERFICIALIZATION LEFT RADIAL CEPHALIC (Left) LIGATION OF MULTIPLE COMPETING BRANCHES OF ARTERIOVENOUS FISTULA (Left)  Patient Location: PACU  Anesthesia Type: MAC  Level of Consciousness: awake and alert   Airway and Oxygen Therapy: Patient Spontanous Breathing  Post-op Pain: mild  Post-op Assessment: Post-op Vital signs reviewed, Patient's Cardiovascular Status Stable, Respiratory Function Stable, Patent Airway and No signs of Nausea or vomiting  Last Vitals:  Filed Vitals:   08/04/15 1215  BP: 105/68  Pulse:   Temp:   Resp: 20    Post-op Vital Signs: stable   Complications: No apparent anesthesia complications

## 2015-08-04 NOTE — Anesthesia Preprocedure Evaluation (Addendum)
Anesthesia Evaluation  Patient identified by MRN, date of birth, ID band Patient awake    Reviewed: Allergy & Precautions, NPO status , Patient's Chart, lab work & pertinent test results  Airway Mallampati: III   Neck ROM: full    Dental  (+) Teeth Intact, Dental Advisory Given   Pulmonary shortness of breath,  breath sounds clear to auscultation        Cardiovascular hypertension, + Peripheral Vascular Disease Rhythm:regular Rate:Normal     Neuro/Psych Anxiety CVA    GI/Hepatic   Endo/Other  Morbid obesity  Renal/GU ESRFRenal disease     Musculoskeletal   Abdominal   Peds  Hematology   Anesthesia Other Findings   Reproductive/Obstetrics                            Anesthesia Physical  Anesthesia Plan  ASA: III  Anesthesia Plan: MAC   Post-op Pain Management:    Induction: Intravenous  Airway Management Planned: Simple Face Mask  Additional Equipment:   Intra-op Plan:   Post-operative Plan:   Informed Consent: I have reviewed the patients History and Physical, chart, labs and discussed the procedure including the risks, benefits and alternatives for the proposed anesthesia with the patient or authorized representative who has indicated his/her understanding and acceptance.     Plan Discussed with: CRNA, Anesthesiologist and Surgeon  Anesthesia Plan Comments:         Anesthesia Quick Evaluation

## 2015-08-04 NOTE — Transfer of Care (Signed)
Immediate Anesthesia Transfer of Care Note  Patient: [name]  Procedure(s) Performed: Procedure(s): FISTULA SUPERFICIALIZATION LEFT RADIAL CEPHALIC (Left) LIGATION OF MULTIPLE COMPETING BRANCHES OF ARTERIOVENOUS FISTULA (Left)  Patient Location: PACU  Anesthesia Type:MAC  Level of Consciousness: awake, alert  and oriented  Airway & Oxygen Therapy: Patient Spontanous Breathing  Post-op Assessment: Report given to RN, Post -op Vital signs reviewed and stable and Patient moving all extremities X 4  Post vital signs: Reviewed and stable  Last Vitals:  Filed Vitals:   08/04/15 0750  BP: 132/77  Pulse: 71  Temp: 36.6 C  Resp: 18    Complications: No apparent anesthesia complications

## 2015-08-05 ENCOUNTER — Encounter (HOSPITAL_COMMUNITY): Payer: Self-pay | Admitting: Vascular Surgery

## 2015-08-05 ENCOUNTER — Telehealth: Payer: Self-pay | Admitting: Vascular Surgery

## 2015-08-05 NOTE — Telephone Encounter (Signed)
-----   Message from Mena Goes, RN sent at 08/04/2015 12:43 PM EDT ----- Regarding: schedule   ----- Message -----    From: Gabriel Earing, PA-C    Sent: 08/04/2015  11:56 AM      To: [facility] Charge Pool  S/p superficialization of left RC AVF 08/04/15.  F/u with Dr. Kellie Simmering in 6 weeks.  Thanks, Aldona Bar

## 2015-08-05 NOTE — Telephone Encounter (Signed)
Unable to reach pt at ph #'s listed. Unable to leave voicemail. Mailed appointment information to patients home address.

## 2015-08-12 ENCOUNTER — Other Ambulatory Visit: Payer: Self-pay | Admitting: Cardiovascular Disease

## 2015-08-12 NOTE — Telephone Encounter (Signed)
Patient calling the office for samples of medication:   1.  What medication and dosage are you requesting samples for?Tribenzor  2.  Are you currently out of this medication? 2 left  3. Are you requesting samples to get you through until a mail order prescription arrives?no

## 2015-08-12 NOTE — Telephone Encounter (Signed)
We currently do not have samples in the strength patient takes. Call has been placed to a representative to request samples of the particular strength. [name], PharmD, left message for return call.  Spoke patient to notify him. Asked if he had a prescription, he stated that he did indeed have one. Told him I would call him again if I heard anything.

## 2015-08-19 ENCOUNTER — Other Ambulatory Visit: Payer: Self-pay | Admitting: Internal Medicine

## 2015-08-26 ENCOUNTER — Other Ambulatory Visit: Payer: Self-pay | Admitting: Family Medicine

## 2015-09-07 ENCOUNTER — Ambulatory Visit (HOSPITAL_COMMUNITY)
Admission: RE | Admit: 2015-09-07 | Discharge: 2015-09-07 | Disposition: A | Discharge status: Home / Self Care | Payer: BLUE CROSS/BLUE SHIELD | Source: Ambulatory Visit | Attending: Vascular Surgery | Admitting: Vascular Surgery

## 2015-09-07 DIAGNOSIS — N186 End stage renal disease: Secondary | ICD-10-CM | POA: Insufficient documentation

## 2015-09-07 DIAGNOSIS — Z4931 Encounter for adequacy testing for hemodialysis: Secondary | ICD-10-CM | POA: Diagnosis present

## 2015-09-13 ENCOUNTER — Encounter: Payer: Self-pay | Admitting: Vascular Surgery

## 2015-09-14 ENCOUNTER — Ambulatory Visit (INDEPENDENT_AMBULATORY_CARE_PROVIDER_SITE_OTHER): Payer: BLUE CROSS/BLUE SHIELD | Admitting: Vascular Surgery

## 2015-09-14 ENCOUNTER — Encounter: Payer: Self-pay | Admitting: Vascular Surgery

## 2015-09-14 VITALS — BP 122/85 | HR 71 | Temp 97.6°F | Resp 16 | Ht 72.0 in | Wt 353.0 lb

## 2015-09-14 DIAGNOSIS — N183 Chronic kidney disease, stage 3 unspecified: Secondary | ICD-10-CM | POA: Insufficient documentation

## 2015-09-14 NOTE — Progress Notes (Signed)
Subjective:     Patient ID: [name], male   DOB: [birth year], 44 y.o.   MRN: [id number]  HPI this 44 year old male with chronic renal insufficiency returns for follow-up regarding his left radial-cephalic AV fistula. He underwent a superficiaization procedure 6 weeks ago. He denies any pain or numbness in the left hand. He states that the kidney doctors have informed him that he is now improved to stage III chronic kidney disease. He has never had hemodialysis.   Review of Systems     Objective:   Physical Exam BP 122/85 mmHg  Pulse 71  Temp(Src) 97.6 F (36.4 C)  Resp 16  Ht 6' (1.829 m)  Wt 353 lb (160.12 kg)  BMI 47.87 kg/m2  SpO2 99%  General well-developed obese male no apparent distress alert and oriented 3 Left upper extremity with well-healed incisions. Excellent pulse and palpable thrill and radial-cephalic AV fistula which was easily palpable beneath the skin. No ischemia of left hand noted.  Patient had a duplex scan of the fistula performed on September 13 in our laboratory which I reviewed and interpreted. Patient does have excellent flow throughout the fistula. There is some dilatation near the arterial anastomosis but not a true aneurysm. No branches were noted.     Assessment:     Nicely functioning left radial-cephalic AV fistula which could be utilized at any time if needed Patient's renal function has actually improved and is now chronic kidney disease stage III    Plan:     Patient will return in December as previously scheduled for follow-up of his aortic aneurysmal disease

## 2015-12-08 ENCOUNTER — Encounter: Payer: Self-pay | Admitting: Vascular Surgery

## 2015-12-14 ENCOUNTER — Ambulatory Visit
Admission: RE | Admit: 2015-12-14 | Discharge: 2015-12-14 | Disposition: A | Discharge status: Home / Self Care | Payer: BLUE CROSS/BLUE SHIELD | Source: Ambulatory Visit | Attending: Vascular Surgery | Admitting: Vascular Surgery

## 2015-12-14 ENCOUNTER — Ambulatory Visit (INDEPENDENT_AMBULATORY_CARE_PROVIDER_SITE_OTHER): Payer: BLUE CROSS/BLUE SHIELD | Admitting: Vascular Surgery

## 2015-12-14 ENCOUNTER — Encounter: Payer: Self-pay | Admitting: Vascular Surgery

## 2015-12-14 VITALS — BP 130/87 | HR 68 | Temp 97.6°F | Resp 14 | Ht 72.0 in | Wt 347.0 lb

## 2015-12-14 DIAGNOSIS — N186 End stage renal disease: Secondary | ICD-10-CM

## 2015-12-14 DIAGNOSIS — I714 Abdominal aortic aneurysm, without rupture, unspecified: Secondary | ICD-10-CM

## 2015-12-14 DIAGNOSIS — Z48812 Encounter for surgical aftercare following surgery on the circulatory system: Secondary | ICD-10-CM

## 2015-12-14 NOTE — Progress Notes (Signed)
Subjective:     Patient ID: [name], male   DOB: Jan 08, 1971, 44 y.o.   MRN: [id number]  HPI This 44 -year-old male returns for continued follow-up regarding his endovascular repair of an aortic aneurysm performed a few years ago. Patient also has chronic kidney disease stage IV and has a fistula in the left forearm which I created. He has lost 30 pounds in the last 6 months. He continues to have slight improvement in his kidney function and has not yet been on hemodialysis. He is followed by Dr. [name].  Past Medical History  Diagnosis Date  . Chronic kidney disease   . History of pulmonary embolus (PE) 2010  . 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 Left ventricular hypertrophy   . Abdominal aortic aneurysm ([place])   . Stroke [facility]) 2009    pt says no stroke  . Hypertension     dr [name]  . Anxiety   . DVT (deep venous thrombosis) ([place])   . Obesity   . Chronic renal disease     advanced  . Aortic regurgitation 12/10/08    Echo: mild to mod. AOV regurg,mild AO root dilatation, LA mildly dilated, EF 65%, LV wall thickness markedly increased, small PE  . Anemia     Social History  Substance Use Topics  . Smoking status: Never Smoker   . Smokeless tobacco: Never Used  . Alcohol Use: No    Family History  Problem Relation Age of Onset  . Hypertension Mother   . Hypertension Father     Allergies  Allergen Reactions  . Roxicodone [Oxycodone Hcl] Itching and Other (See Comments)    Hives and itching all over     Current outpatient prescriptions:  .  allopurinol (ZYLOPRIM) 300 MG tablet, Take 0.5 tablets (150 mg total) by mouth daily., Disp: 90 tablet, Rfl: 0 .  atorvastatin (LIPITOR) 10 MG tablet, Take 1 tablet by mouth  daily, Disp: 90 tablet, Rfl: 1 .  calcitRIOL (ROCALTROL) 0.25 MCG capsule, Take 1 capsule (0.25 mcg total) by mouth every Monday, Wednesday, and Friday., Disp: 63 capsule, Rfl: 1 .  carvedilol (COREG) 25 MG tablet, Take  1 tablet (25 mg total) by mouth 2 (two) times daily., Disp: 180 tablet, Rfl: 3 .  ELIQUIS 2.5 MG TABS tablet, TAKE 1 TABLET (2.5 MG TOTAL) BY MOUTH 2 (TWO) TIMES DAILY., Disp: 60 tablet, Rfl: 5 .  furosemide (LASIX) 40 MG tablet, Take 1 tablet by mouth  daily, Disp: 90 tablet, Rfl: 1 .  Olmesartan-Amlodipine-HCTZ 40-10-12.5 MG TABS, Take 1 tablet by mouth daily., Disp: 90 tablet, Rfl: 2 .  acetaminophen (TYLENOL) 500 MG tablet, Take 1,000 mg by mouth every 6 (six) hours as needed for moderate pain or headache. Reported on 12/14/2015, Disp: , Rfl:  .  allopurinol (ZYLOPRIM) 300 MG tablet, TAKE 1 TABLET BY MOUTH DAILY (Patient not taking: Reported on 12/14/2015), Disp: 90 tablet, Rfl: 0 .  traMADol (ULTRAM) 50 MG tablet, Take 2 tablets (100 mg total) by mouth every 4 (four) hours as needed for moderate pain. (Patient not taking: Reported on 12/14/2015), Disp: 20 tablet, Rfl: 0  Filed Vitals:   12/14/15 1301  BP: 130/87  Pulse: 68  Temp: 97.6 F (36.4 C)  Resp: 14  Height: 6' (1.829 m)  Weight: 347 lb (157.398 kg)  SpO2: 98%    Body mass index is 47.05 kg/(m^2).           Review of  Systems  Patient has history of pulmonary  Embolus. Denies chest pain or shortness of breath. He has no hemoptysis. Denies claudication. Continues divide obesity with recent weight loss. Chronic renal insufficiency renal function improving.     Objective:   Physical Exam BP 130/87 mmHg  Pulse 68  Temp(Src) 97.6 F (36.4 C)  Resp 14  Ht 6' (1.829 m)  Wt 347 lb (157.398 kg)  BMI 47.05 kg/m2  SpO2 98%  Gen.-alert and oriented x3 in no apparent distress  Morbidly obese HEENT normal for age Lungs no rhonchi or wheezing Cardiovascular regular rhythm no murmurs carotid pulses 3+ palpable no bruits audible Abdomen soft nontender no palpable masses -obese Musculoskeletal free of  major deformities Skin clear -no rashes Neurologic normal Lower extremities 3+ femoral and dorsalis pedis pulses palpable  bilaterally with no edema  left upper extremity with radial-cephalic AV fistula with good pulse and palpable thrill. Vein is beginning to dilate up to 2-1/2 cm in diameter near the wrist.   I ordered a CT scan of the abdomen and pelvis without contrast because of patient's renal insufficiency. The diameter of the aneurysm sac is about 3.6 cm around the endograft which has not migrated. It is in excellent position. Unable to assess flow through the graft.  Patient does have a probable cyst in the apex of his right kidney.       Assessment:       #1 status post endovascular repair of abdominal aortic aneurysm with good contraction of aneurysm sac around the graft #2  Chronic kidney disease stage IV with functioning left radial-cephalic AV fistula-never used #3 morbid obesity #4 probable cyst apex right kidney -I discussed this with patient and he will further discuss this with Dr. [name]    Plan:      return in 1 year with CT scan of chest and abdomen with no contrast and will see nurse practitioner. I advised patient that I will be retiring in July and that he will be assigned to another physician following next year's visit

## 2015-12-15 NOTE — Addendum Note (Signed)
Addended by: Dorthula Rue L on: 12/15/2015 02:14 PM   Modules accepted: Orders

## 2015-12-28 ENCOUNTER — Other Ambulatory Visit: Payer: Self-pay | Admitting: Nephrology

## 2015-12-28 DIAGNOSIS — N185 Chronic kidney disease, stage 5: Secondary | ICD-10-CM

## 2015-12-31 ENCOUNTER — Other Ambulatory Visit: Payer: Self-pay | Admitting: Cardiovascular Disease

## 2015-12-31 ENCOUNTER — Other Ambulatory Visit: Payer: Self-pay | Admitting: Internal Medicine

## 2015-12-31 NOTE — Telephone Encounter (Signed)
Rx(s) sent to pharmacy electronically.  

## 2016-01-03 ENCOUNTER — Other Ambulatory Visit: Payer: BLUE CROSS/BLUE SHIELD

## 2016-01-11 ENCOUNTER — Ambulatory Visit
Admission: RE | Admit: 2016-01-11 | Discharge: 2016-01-11 | Disposition: A | Discharge status: Home / Self Care | Payer: BLUE CROSS/BLUE SHIELD | Source: Ambulatory Visit | Attending: Nephrology | Admitting: Nephrology

## 2016-01-11 DIAGNOSIS — N185 Chronic kidney disease, stage 5: Secondary | ICD-10-CM

## 2016-01-13 ENCOUNTER — Telehealth: Payer: Self-pay | Admitting: Pharmacist Clinician (PhC)/ Clinical Pharmacy Specialist

## 2016-01-13 MED ORDER — OLMESARTAN-AMLODIPINE-HCTZ 40-10-25 MG PO TABS: 1.0000 | ORAL_TABLET | Freq: Every day | ORAL | Status: DC

## 2016-01-13 NOTE — Telephone Encounter (Signed)
Patient concerned, cannot afford Tribenzor 40/10/12.5 at this time. We have samples of the 40/10/25 in the office.  Reviewed with Dr. Loletha Grayer, will have patient decrease his furosemide from 40 mg to 20 mg (pt states he doesn't take every day), and then use the samples we have in office.  Advised him that if he notices any dizziness/lightheadedness or other problems to call the office.  Explained in detail the differences between the dosing.  Patient voiced understanding

## 2016-01-30 ENCOUNTER — Other Ambulatory Visit: Payer: Self-pay | Admitting: Cardiovascular Disease

## 2016-01-31 NOTE — Telephone Encounter (Signed)
Rx(s) sent to pharmacy electronically.  

## 2016-02-20 ENCOUNTER — Other Ambulatory Visit: Payer: Self-pay | Admitting: Internal Medicine

## 2016-02-25 ENCOUNTER — Other Ambulatory Visit: Payer: Self-pay | Admitting: Internal Medicine

## 2016-02-25 MED ORDER — CALCITRIOL 0.25 MCG PO CAPS: ORAL_CAPSULE | ORAL | Status: DC

## 2016-02-25 NOTE — Telephone Encounter (Signed)
Pt request refill of the following: calcitRIOL (ROCALTROL) 0.25 MCG capsule  Pt said [facility] said they did not received the above rx     Phamacy:

## 2016-02-25 NOTE — Telephone Encounter (Signed)
Rx resent.

## 2016-03-31 ENCOUNTER — Other Ambulatory Visit: Payer: Self-pay | Admitting: Cardiovascular Disease

## 2016-07-10 ENCOUNTER — Other Ambulatory Visit: Payer: Self-pay | Admitting: Cardiovascular Disease

## 2016-07-18 ENCOUNTER — Other Ambulatory Visit: Payer: Self-pay | Admitting: Cardiovascular Disease

## 2016-07-21 ENCOUNTER — Other Ambulatory Visit: Payer: Self-pay | Admitting: Cardiovascular Disease

## 2016-07-21 ENCOUNTER — Other Ambulatory Visit: Payer: Self-pay | Admitting: Internal Medicine

## 2016-07-31 ENCOUNTER — Other Ambulatory Visit: Payer: Self-pay | Admitting: Internal Medicine

## 2016-07-31 ENCOUNTER — Ambulatory Visit (INDEPENDENT_AMBULATORY_CARE_PROVIDER_SITE_OTHER): Payer: BLUE CROSS/BLUE SHIELD | Admitting: Internal Medicine

## 2016-07-31 ENCOUNTER — Other Ambulatory Visit: Payer: Self-pay | Admitting: Cardiovascular Disease

## 2016-07-31 ENCOUNTER — Encounter: Payer: Self-pay | Admitting: Internal Medicine

## 2016-07-31 VITALS — BP 130/90 | HR 67 | Temp 98.1°F | Ht 72.0 in | Wt 357.0 lb

## 2016-07-31 DIAGNOSIS — I10 Essential (primary) hypertension: Secondary | ICD-10-CM | POA: Diagnosis not present

## 2016-07-31 DIAGNOSIS — N184 Chronic kidney disease, stage 4 (severe): Secondary | ICD-10-CM

## 2016-07-31 DIAGNOSIS — G4733 Obstructive sleep apnea (adult) (pediatric): Secondary | ICD-10-CM

## 2016-07-31 NOTE — Progress Notes (Signed)
Subjective:    Patient ID: [name], male    DOB: 08-23-1971, 45 y.o.   MRN: [id number]  HPI 45 year old patient who has a history of essential hypertension as well as end-stage renal disease. The past several days he has had some head congestion, hoarseness and mild nonproductive cough.  Earlier in the week he had some mild fever that has resolved. He seems to be improving.  He has been encouraged to have screening for OSA in the past.  His wife does describe loud snoring and periods of brief apnea.  He does describe some mild daytime sleepiness    Past Medical History:  Diagnosis Date  . Abdominal aortic aneurysm ([place])   . Anemia   . Anxiety   . Aortic regurgitation 12/10/08   Echo: mild to mod. AOV regurg,mild AO root dilatation, LA mildly dilated, EF 65%, LV wall thickness markedly increased, small PE  . Chronic kidney disease   . Chronic renal disease    advanced  . DVT (deep venous thrombosis) ([place])   . H/O aortic dissection 2009   infarenal abdominal aortic dissection  . History of pulmonary embolus (PE) 2010  . Hypertension    dr [name]  . Left ventricular hypertrophy   . Obesity   . Stroke [facility]) 2009   pt says no stroke     Social History   Social History  . Marital status: Married    Spouse name: N/A  . Number of children: N/A  . Years of education: N/A   Occupational History  . Not on file.   Social History Main Topics  . Smoking status: Never Smoker  . Smokeless tobacco: Never Used  . Alcohol use No  . Drug use: No  . Sexual activity: Not Currently    Partners: Female   Other Topics Concern  . Not on file   Social History Narrative  . No narrative on file    Past Surgical History:  Procedure Laterality Date  . ABDOMINAL AORTAGRAM N/A 02/06/2012   Procedure: ABDOMINAL Maxcine Ham;  Surgeon: [name], MD;  Location: [facility] CATH LAB;  Service: Cardiovascular;  Laterality: N/A;  . ABDOMINAL AORTIC ANEURYSM REPAIR  09/25/2012   EVAR   . AV FISTULA PLACEMENT Left 05/19/2015   Procedure: ARTERIOVENOUS (AV) FISTULA CREATION-LEFT RADIOCEPHALIC;  Surgeon: Mal [name], MD;  Location: [place];  Service: Vascular;  Laterality: Left;  . CARDIAC CATHETERIZATION  02/11/10   false + Nuc  . CORNEAL TRANSPLANT    . FISTULA SUPERFICIALIZATION Left 08/04/2015   Procedure: FISTULA SUPERFICIALIZATION LEFT RADIAL CEPHALIC;  Surgeon: Mal [name], MD;  Location: [place];  Service: Vascular;  Laterality: Left;  . INTRAVASCULAR ULTRASOUND  09/25/2012   Procedure: INTRAVASCULAR ULTRASOUND;  Surgeon: Mal [name], MD;  Location: [place];  Service: Vascular;  Laterality: N/A;  . LIGATION OF COMPETING BRANCHES OF ARTERIOVENOUS FISTULA Left 08/04/2015   Procedure: LIGATION OF MULTIPLE COMPETING BRANCHES OF ARTERIOVENOUS FISTULA;  Surgeon: Mal [name], MD;  Location[facility];  Service: Vascular;  Laterality: Left;    Family History  Problem Relation Age of Onset  . Hypertension Mother   . Hypertension Father     Allergies  Allergen Reactions  . Roxicodone [Oxycodone Hcl] Itching and Other (See Comments)    Hives and itching all over    Current Outpatient Prescriptions on File Prior to Visit  Medication Sig Dispense Refill  . acetaminophen (TYLENOL) 500 MG tablet Take 1,000 mg by mouth every 6 (  six) hours as needed for moderate pain or headache. Reported on 12/14/2015    . atorvastatin (LIPITOR) 10 MG tablet Take 1 tablet by mouth  daily 90 tablet 2  . calcitRIOL (ROCALTROL) 0.25 MCG capsule TAKE 1 CAPSULE BY MOUTH  EVERY MONDAY, WEDNESDAY,  AND FRIDAY. 9 capsule 6  . carvedilol (COREG) 25 MG tablet Take 1 tablet by mouth two  times daily 3 tablet 0  . ELIQUIS 2.5 MG TABS tablet TAKE 1 TABLET BY MOUTH TWICE A DAY 60 tablet 5  . furosemide (LASIX) 40 MG tablet Take 1 tablet by mouth  daily 90 tablet 0  . Olmesartan-Amlodipine-HCTZ 40-10-25 MG TABS Take 1 tablet by mouth daily. 56 tablet 0  . traMADol (ULTRAM) 50 MG tablet Take 2 tablets (100  mg total) by mouth every 4 (four) hours as needed for moderate pain. 20 tablet 0  . allopurinol (ZYLOPRIM) 300 MG tablet TAKE 1 TABLET BY MOUTH DAILY (Patient not taking: Reported on 12/14/2015) 90 tablet 0   No current facility-administered medications on file prior to visit.     BP 130/90 (BP Location: Right Arm, Patient Position: Sitting, Cuff Size: Large)   Pulse 67   Temp 98.1 F (36.7 C) (Oral)   Ht 6' (1.829 m)   Wt (!) 357 lb (161.9 kg)   SpO2 98%   BMI 48.42 kg/m      Review of Systems     Objective:   Physical Exam  Constitutional: He is oriented to person, place, and time. He appears well-developed.  Obese Repeat blood pressure 122/84   HENT:  Head: Normocephalic.  Right Ear: External ear normal.  Left Ear: External ear normal.  Pharyngeal crowding  Eyes: Conjunctivae and EOM are normal.  Neck: Normal range of motion.  Cardiovascular: Normal rate and normal heart sounds.   Pulmonary/Chest: Breath sounds normal.  Abdominal: Bowel sounds are normal.  Musculoskeletal: Normal range of motion. He exhibits no edema or tenderness.  Neurological: He is alert and oriented to person, place, and time.  Skin:  AV fistula left lower arm  Psychiatric: He has a normal mood and affect. His behavior is normal.          Assessment & Plan:   Resolving viral URI.  Will continue symptomatic treatment Essential hypertension Obesity/OSA suspect.  Will set up for sleep study Follow-up nephrology  Return in 6 months for follow-up

## 2016-07-31 NOTE — Progress Notes (Signed)
Hypertension follow up

## 2016-08-01 MED ORDER — FUROSEMIDE 40 MG PO TABS: 40.0000 mg | ORAL_TABLET | Freq: Every day | ORAL | 0 refills | Status: DC

## 2016-08-01 MED ORDER — CARVEDILOL 25 MG PO TABS: 25.0000 mg | ORAL_TABLET | Freq: Two times a day (BID) | ORAL | 0 refills | Status: DC

## 2016-08-01 MED ORDER — OLMESARTAN-AMLODIPINE-HCTZ 40-10-25 MG PO TABS: 1.0000 | ORAL_TABLET | Freq: Every day | ORAL | 0 refills | Status: DC

## 2016-08-01 MED ORDER — ALLOPURINOL 300 MG PO TABS: 300.0000 mg | ORAL_TABLET | Freq: Every day | ORAL | 0 refills | Status: DC

## 2016-08-02 ENCOUNTER — Other Ambulatory Visit: Payer: Self-pay | Admitting: Internal Medicine

## 2016-08-08 ENCOUNTER — Other Ambulatory Visit: Payer: Self-pay | Admitting: Cardiovascular Disease

## 2016-08-17 ENCOUNTER — Ambulatory Visit (INDEPENDENT_AMBULATORY_CARE_PROVIDER_SITE_OTHER): Payer: BLUE CROSS/BLUE SHIELD | Admitting: Cardiovascular Disease

## 2016-08-17 ENCOUNTER — Encounter: Payer: Self-pay | Admitting: Cardiovascular Disease

## 2016-08-17 VITALS — BP 118/88 | HR 68 | Ht 72.0 in | Wt 357.2 lb

## 2016-08-17 DIAGNOSIS — I8290 Acute embolism and thrombosis of unspecified vein: Secondary | ICD-10-CM

## 2016-08-17 DIAGNOSIS — I714 Abdominal aortic aneurysm, without rupture, unspecified: Secondary | ICD-10-CM

## 2016-08-17 DIAGNOSIS — I351 Nonrheumatic aortic (valve) insufficiency: Secondary | ICD-10-CM | POA: Diagnosis not present

## 2016-08-17 DIAGNOSIS — I723 Aneurysm of iliac artery: Secondary | ICD-10-CM

## 2016-08-17 DIAGNOSIS — I7121 Aneurysm of the ascending aorta, without rupture: Secondary | ICD-10-CM

## 2016-08-17 DIAGNOSIS — I712 Thoracic aortic aneurysm, without rupture: Secondary | ICD-10-CM

## 2016-08-17 DIAGNOSIS — I10 Essential (primary) hypertension: Secondary | ICD-10-CM

## 2016-08-17 DIAGNOSIS — I5030 Unspecified diastolic (congestive) heart failure: Secondary | ICD-10-CM

## 2016-08-17 DIAGNOSIS — I509 Heart failure, unspecified: Secondary | ICD-10-CM | POA: Diagnosis not present

## 2016-08-17 DIAGNOSIS — Z7901 Long term (current) use of anticoagulants: Secondary | ICD-10-CM

## 2016-08-17 DIAGNOSIS — N184 Chronic kidney disease, stage 4 (severe): Secondary | ICD-10-CM

## 2016-08-17 MED ORDER — FUROSEMIDE 40 MG PO TABS: 40.0000 mg | ORAL_TABLET | Freq: Every day | ORAL | 3 refills | Status: DC

## 2016-08-17 MED ORDER — OLMESARTAN-AMLODIPINE-HCTZ 40-10-12.5 MG PO TABS: 1.0000 | ORAL_TABLET | Freq: Every day | ORAL | Status: DC

## 2016-08-17 MED ORDER — CARVEDILOL 25 MG PO TABS: 25.0000 mg | ORAL_TABLET | Freq: Two times a day (BID) | ORAL | 3 refills | Status: DC

## 2016-08-17 MED ORDER — APIXABAN 2.5 MG PO TABS: 2.5000 mg | ORAL_TABLET | Freq: Two times a day (BID) | ORAL | 3 refills | Status: DC

## 2016-08-17 NOTE — Progress Notes (Signed)
Cardiology Office Note    Date:  08/17/2016   ID:  [name], DOB 1971/09/21, MRN [id number]  PCP:  Nyoka Cowden, MD  Cardiologist:   Sanda Klein, MD   Chief Complaint  Patient presents with  . Follow-up    pt c/o swelling in feet     History of Present Illness:  [name] is a 45 y.o. male with obesity, severe systemic hypertension, moderate aneurysm of the ascending aorta, history of stent graft repair for abdominal aortic aneurysm and dissection (Dr. Kellie Simmering), bilateral iliac artery aneurysms, hypertensive heart disease with severe left ventricular hypertrophy and well compensated diastolic heart failure, mild to moderate aortic insufficiency, history of DVT and pulmonary embolism on chronic anticoagulation, chronic kidney disease stage IV (Dr. [name])  We have suspected sleep apnea but due to medical problems since the study has not yet been performed. This is now being scheduled at the request of his primary care provider. He is still not sure when he will have it done since his wife will require aggressive inpatient treatment for progressive multiple sclerosis at [facility].  He generally feels well and denies any problems with shortness of breath, chest discomfort, abdominal pain, overt bleeding, focal neurological complaints cough, hemoptysis, palpitations or syncope. He does have off and on problems with ankle edema and takes furosemide as needed, somewhere between weekly and monthly.  I don't have his most recent labs, but typically his creatinine has hovered in the high 2s, GFR approximately 30.  Past Medical History:  Diagnosis Date  . Abdominal aortic aneurysm ([place])   . Anemia   . Anxiety   . Aortic regurgitation 12/10/08   Echo: mild to mod. AOV regurg,mild AO root dilatation, LA mildly dilated, EF 65%, LV wall thickness markedly increased, small PE  . Chronic kidney disease   . Chronic renal disease    advanced  . DVT (deep venous  thrombosis) ([place])   . H/O aortic dissection 2009   infarenal abdominal aortic dissection  . History of pulmonary embolus (PE) 2010  . Hypertension    dr [name]  . Left ventricular hypertrophy   . Obesity   . Stroke [facility]) 2009   pt says no stroke    Past Surgical History:  Procedure Laterality Date  . ABDOMINAL AORTAGRAM N/A 02/06/2012   Procedure: ABDOMINAL Maxcine Ham;  Surgeon: [name], MD;  Location: [facility] CATH LAB;  Service: Cardiovascular;  Laterality: N/A;  . ABDOMINAL AORTIC ANEURYSM REPAIR  09/25/2012   EVAR  . AV FISTULA PLACEMENT Left 05/19/2015   Procedure: ARTERIOVENOUS (AV) FISTULA CREATION-LEFT RADIOCEPHALIC;  Surgeon: Mal [name], MD;  Location: [place];  Service: Vascular;  Laterality: Left;  . CARDIAC CATHETERIZATION  02/11/10   false + Nuc  . CORNEAL TRANSPLANT    . FISTULA SUPERFICIALIZATION Left 08/04/2015   Procedure: FISTULA SUPERFICIALIZATION LEFT RADIAL CEPHALIC;  Surgeon: Mal [name], MD;  Location: [place];  Service: Vascular;  Laterality: Left;  . INTRAVASCULAR ULTRASOUND  09/25/2012   Procedure: INTRAVASCULAR ULTRASOUND;  Surgeon: Mal [name], MD;  Location: [place];  Service: Vascular;  Laterality: N/A;  . LIGATION OF COMPETING BRANCHES OF ARTERIOVENOUS FISTULA Left 08/04/2015   Procedure: LIGATION OF MULTIPLE COMPETING BRANCHES OF ARTERIOVENOUS FISTULA;  Surgeon: Mal [name], MD;  Location: [place];  Service: Vascular;  Laterality: Left;    Current Medications: Outpatient Medications Prior to Visit  Medication Sig Dispense Refill  . acetaminophen (TYLENOL) 500 MG tablet Take 1,000 mg by mouth  every 6 (six) hours as needed for moderate pain or headache. Reported on 12/14/2015    . allopurinol (ZYLOPRIM) 300 MG tablet Take 1 tablet (300 mg total) by mouth daily. 90 tablet 0  . atorvastatin (LIPITOR) 10 MG tablet Take 1 tablet by mouth  daily 90 tablet 2  . calcitRIOL (ROCALTROL) 0.25 MCG capsule TAKE 1 CAPSULE BY MOUTH  EVERY MONDAY, WEDNESDAY,  AND  FRIDAY. 24 capsule 4  . carvedilol (COREG) 25 MG tablet Take 1 tablet (25 mg total) by mouth 2 (two) times daily. 60 tablet 0  . ELIQUIS 2.5 MG TABS tablet TAKE 1 TABLET BY MOUTH TWICE A DAY 60 tablet 5  . furosemide (LASIX) 40 MG tablet Take 1 tablet (40 mg total) by mouth daily. 90 tablet 0  . Olmesartan-Amlodipine-HCTZ 40-10-25 MG TABS Take 1 tablet by mouth daily. Please keep upcoming appointment for additional refills 30 tablet 0  . traMADol (ULTRAM) 50 MG tablet Take 2 tablets (100 mg total) by mouth every 4 (four) hours as needed for moderate pain. (Patient not taking: Reported on 08/17/2016) 20 tablet 0   No facility-administered medications prior to visit.      Allergies:   Roxicodone [oxycodone hcl]   Social History   Social History  . Marital status: Married    Spouse name: N/A  . Number of children: N/A  . Years of education: N/A   Social History Main Topics  . Smoking status: Never Smoker  . Smokeless tobacco: Never Used  . Alcohol use No  . Drug use: No  . Sexual activity: Not Currently    Partners: Female   Other Topics Concern  . None   Social History Narrative  . None     Family History:  The patient's family history includes Hypertension in his father and mother.   ROS:   Please see the history of present illness.    ROS All other systems reviewed and are negative.   PHYSICAL EXAM:   VS:  BP 118/88 (BP Location: Right Arm, Patient Position: Sitting, Cuff Size: Large)   Pulse 68   Ht 6' (1.829 m)   Wt (!) 357 lb 3.2 oz (162 kg)   SpO2 95%   BMI 48.45 kg/m    GEN: Morbidly obese, well developed, in no acute distress  HEENT: normal  Neck: no JVD, carotid bruits, or masses Cardiac: RRR; no murmurs, rubs, or gallops, 2+ ankle symmetrical edema  Respiratory:  clear to auscultation bilaterally, normal work of breathing GI: soft, nontender, nondistended, + BS MS: no deformity or atrophy  Skin: warm and dry, no rash Neuro:  Alert and Oriented x 3,  Strength and sensation are intact Psych: euthymic mood, full affect  Wt Readings from Last 3 Encounters:  08/17/16 (!) 357 lb 3.2 oz (162 kg)  07/31/16 (!) 357 lb (161.9 kg)  12/14/15 (!) 347 lb (157.4 kg)      Studies/Labs Reviewed:   EKG:  EKG is ordered today.  The ekg ordered today demonstrates Sinus rhythm, inverted T waves in leads 1, aVL, V5-V6, most likely due to left ventricular hypertrophy with voltage masked by obesity.  Recent Labs: No results found for requested labs within last 8760 hours.   Lipid Panel    Component Value Date/Time   CHOL 140 02/29/2012 0829   TRIG 72 02/29/2012 0829   HDL 42 02/29/2012 0829   CHOLHDL 3.3 02/29/2012 0829   VLDL 14 02/29/2012 0829   LDLCALC 84 02/29/2012 0829  ASSESSMENT:    1. Heart failure with preserved left ventricular function (HFpEF) ([place])   2. Essential hypertension, malignant   3. Ascending aortic aneurysm ([place])   4. Aortic insufficiency   5. AAA (abdominal aortic aneurysm) without rupture ([place])   6. Iliac aneurysm ([place])   7. Chronic kidney disease, stage IV (severe) ([facility])   8. h/o VTE (venous thromboembolism)   9. Long term (current) use of anticoagulants   10. Obesity, morbid, BMI 50 or higher ([place])      PLAN:  In order of problems listed above:  [name] She has not had syncope or palpitations or dizziness. 1. CHF: NYHA class I-II, euvolemic (The physical exam is a challenge due to his obesity) 2. HTN: well controlled. 3. Asc Ao aneurysm: 4.9 cm from previous imaging. Time to reevaluate it. We'll check an echocardiogram as well as a CT without contrast. We'll try to time it to coincide with his imaging study for the abdominal aneurysm, due in December. 4. 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 Time to reevaluate by echo. 5. AAA: Scheduled for repeat CT without contrast in December 6. Iliac aneurysm: as above 7. CKD: Trying to avoid any contrast based procedures, but recent creatinine actually show some improvement,  may be due to better blood pressure control over the last year or 2 8. DVT was complicated by pulmonary embolism. I think plan is for long-term anticoagulation. 9. On Eliquis, no bleeding problems 10. Super-obesity: As always, weight reduction should be a major long-term priority. At this point he is having difficulty focusing on his own health due to his wife's progressive multiple sclerosis. He needs to schedule his sleep study.    Medication Adjustments/Labs and Tests Ordered: Current medicines are reviewed at length with the patient today.  Concerns regarding medicines are outlined above.  Medication changes, Labs and Tests ordered today are listed in the Patient Instructions below. Patient Instructions  Medication Instructions: Dr Sallyanne Kuster recommends that you continue on your current medications as directed. Please refer to the Current Medication list given to you today.  Labwork: NONE ORDERED  Testing/Procedures: 1. Echocardiogram - Your physician has requested that you have an echocardiogram. Echocardiography is a painless test that uses sound waves to create images of your heart. It provides your doctor with information about the size and shape of your heart and how well your heart's chambers and valves are working. This procedure takes approximately one hour. There are no restrictions for this procedure. This will be performed at our [facility] location - [address], [address].  2. CT of Chest/Abdomen/Pelvis - Non-Cardiac CT scanning, (CAT scanning), is a noninvasive, special x-ray that produces cross-sectional images of the body using x-rays and a computer. CT scans help physicians diagnose and treat medical conditions. CT scans provide greater clarity and reveal more details than regular x-ray exams. This has been ordered to be performed at [place], [address]   Follow-up: Dr Sallyanne Kuster recommends that you schedule a follow-up appointment in 12  months. You will receive a reminder letter in the mail two months in advance. If you don't receive a letter, please call our office to schedule the follow-up appointment.  If you need a refill on your cardiac medications before your next appointment, please call your pharmacy.    Signed, Sanda Klein, MD  08/17/2016 9:13 AM    [facility] [place] [address], [place], [place]  [address] Phone: [phone]; Fax: [phone]   

## 2016-08-17 NOTE — Patient Instructions (Addendum)
Medication Instructions: Dr Sallyanne Kuster recommends that you continue on your current medications as directed. Please refer to the Current Medication list given to you today.  Labwork: NONE ORDERED  Testing/Procedures: 1. Echocardiogram - Your physician has requested that you have an echocardiogram. Echocardiography is a painless test that uses sound waves to create images of your heart. It provides your doctor with information about the size and shape of your heart and how well your heart's chambers and valves are working. This procedure takes approximately one hour. There are no restrictions for this procedure. This will be performed at our [place], [address].  2. Chest CT - We will add this on to your CT already scheduled for Decemeber 22nd.  Follow-up: Dr Sallyanne Kuster recommends that you schedule a follow-up appointment in 12 months. You will receive a reminder letter in the mail two months in advance. If you don't receive a letter, please call our office to schedule the follow-up appointment.  If you need a refill on your cardiac medications before your next appointment, please call your pharmacy.

## 2016-08-19 DIAGNOSIS — Z7901 Long term (current) use of anticoagulants: Secondary | ICD-10-CM | POA: Insufficient documentation

## 2016-08-24 ENCOUNTER — Telehealth: Payer: Self-pay | Admitting: Cardiovascular Disease

## 2016-08-24 NOTE — Telephone Encounter (Signed)
[facility] regarding new order from Dr. Lady Gary.  Arena informed me that they are similar tests and so she is trying to contact Dr. Evelena Leyden office and she will call me back.

## 2016-08-29 ENCOUNTER — Other Ambulatory Visit: Payer: Self-pay

## 2016-08-29 ENCOUNTER — Ambulatory Visit (HOSPITAL_COMMUNITY): Payer: BLUE CROSS/BLUE SHIELD | Attending: Cardiovascular Disease

## 2016-08-29 DIAGNOSIS — I351 Nonrheumatic aortic (valve) insufficiency: Secondary | ICD-10-CM | POA: Insufficient documentation

## 2016-08-29 DIAGNOSIS — Z6841 Body Mass Index (BMI) 40.0 and over, adult: Secondary | ICD-10-CM | POA: Diagnosis not present

## 2016-08-29 DIAGNOSIS — I359 Nonrheumatic aortic valve disorder, unspecified: Secondary | ICD-10-CM | POA: Diagnosis present

## 2016-08-29 DIAGNOSIS — I119 Hypertensive heart disease without heart failure: Secondary | ICD-10-CM | POA: Insufficient documentation

## 2016-08-30 ENCOUNTER — Telehealth: Payer: Self-pay | Admitting: Cardiovascular Disease

## 2016-08-30 NOTE — Telephone Encounter (Signed)
Arena called from [facility], they will not be able to do the angio as the patient cannot take IV dye due to chronic kidney failure.  Any questions call Arena at [phone].

## 2016-09-19 ENCOUNTER — Other Ambulatory Visit: Payer: Self-pay | Admitting: Internal Medicine

## 2016-10-08 MED ORDER — OLMESARTAN-AMLODIPINE-HCTZ 40-10-25 MG PO TABS: 1.0000 | ORAL_TABLET | Freq: Every day | ORAL | 0 refills | Status: DC

## 2016-10-24 ENCOUNTER — Other Ambulatory Visit: Payer: Self-pay | Admitting: Internal Medicine

## 2016-10-24 ENCOUNTER — Other Ambulatory Visit: Payer: Self-pay | Admitting: Cardiovascular Disease

## 2016-10-25 ENCOUNTER — Other Ambulatory Visit: Payer: Self-pay

## 2016-10-25 MED ORDER — OLMESARTAN-AMLODIPINE-HCTZ 40-10-25 MG PO TABS: 1.0000 | ORAL_TABLET | Freq: Every day | ORAL | 3 refills | Status: DC

## 2016-10-25 NOTE — Telephone Encounter (Signed)
Erroneous encounter

## 2016-11-14 ENCOUNTER — Other Ambulatory Visit: Payer: Self-pay | Admitting: Cardiovascular Disease

## 2016-11-14 ENCOUNTER — Other Ambulatory Visit: Payer: Self-pay

## 2016-11-14 DIAGNOSIS — I714 Abdominal aortic aneurysm, without rupture, unspecified: Secondary | ICD-10-CM

## 2016-11-20 ENCOUNTER — Other Ambulatory Visit: Payer: Self-pay | Admitting: Cardiovascular Disease

## 2016-11-20 NOTE — Telephone Encounter (Signed)
Rx request sent to pharmacy.  

## 2016-12-07 ENCOUNTER — Encounter: Payer: Self-pay | Admitting: Family

## 2016-12-08 ENCOUNTER — Telehealth: Payer: Self-pay | Admitting: Cardiovascular Disease

## 2016-12-08 DIAGNOSIS — I714 Abdominal aortic aneurysm, without rupture, unspecified: Secondary | ICD-10-CM

## 2016-12-08 DIAGNOSIS — I723 Aneurysm of iliac artery: Secondary | ICD-10-CM

## 2016-12-08 NOTE — Telephone Encounter (Signed)
Called, was not put through to anyone after extended hold, will need caller to call back. PLEASE provide an extension so that the caller may be contacted directly.

## 2016-12-08 NOTE — Telephone Encounter (Signed)
New message  [facility] needs an order added to existing order  CT chest w/o

## 2016-12-13 ENCOUNTER — Other Ambulatory Visit: Payer: Self-pay | Admitting: *Deleted

## 2016-12-13 DIAGNOSIS — I714 Abdominal aortic aneurysm, without rupture, unspecified: Secondary | ICD-10-CM

## 2016-12-15 ENCOUNTER — Ambulatory Visit
Admission: RE | Admit: 2016-12-15 | Discharge: 2016-12-15 | Disposition: A | Discharge status: Home / Self Care | Payer: Medicare Other | Source: Ambulatory Visit | Attending: Vascular Surgery | Admitting: Vascular Surgery

## 2016-12-15 ENCOUNTER — Ambulatory Visit
Admission: RE | Admit: 2016-12-15 | Discharge: 2016-12-15 | Disposition: A | Discharge status: Home / Self Care | Payer: Medicare Other | Source: Ambulatory Visit | Attending: Cardiovascular Disease | Admitting: Cardiovascular Disease

## 2016-12-15 ENCOUNTER — Ambulatory Visit (INDEPENDENT_AMBULATORY_CARE_PROVIDER_SITE_OTHER): Payer: 59 | Admitting: Family

## 2016-12-15 ENCOUNTER — Encounter: Payer: Self-pay | Admitting: Family

## 2016-12-15 VITALS — BP 132/89 | HR 77 | Temp 97.9°F | Resp 20 | Ht 72.0 in | Wt 370.0 lb

## 2016-12-15 DIAGNOSIS — I714 Abdominal aortic aneurysm, without rupture, unspecified: Secondary | ICD-10-CM

## 2016-12-15 DIAGNOSIS — N184 Chronic kidney disease, stage 4 (severe): Secondary | ICD-10-CM | POA: Diagnosis not present

## 2016-12-15 DIAGNOSIS — Z95828 Presence of other vascular implants and grafts: Secondary | ICD-10-CM | POA: Diagnosis not present

## 2016-12-15 DIAGNOSIS — I712 Thoracic aortic aneurysm, without rupture, unspecified: Secondary | ICD-10-CM

## 2016-12-15 DIAGNOSIS — N186 End stage renal disease: Secondary | ICD-10-CM

## 2016-12-15 NOTE — Patient Instructions (Signed)
Thoracic Aortic Aneurysm An aneurysm is a bulge in an artery. It happens when blood pushes up against a weakened or damaged artery wall. A thoracic aortic aneurysm is an aneurysm that occurs in the first part of the aorta, between the heart and the diaphragm. The aorta is the main artery of the body. It supplies blood from the heart to the rest of the body. Some aneurysms may not cause symptoms or problems. However, the major concern with a thoracic aortic aneurysm is that it can enlarge and burst (rupture), or blood can flow between the layers of the wall of the aorta through a tear (aorticdissection). Both of these conditions can cause bleeding inside the body and can be life-threatening if they are not diagnosed and treated right away. What are the causes? The exact cause of this condition is not known. What increases the risk? The following factors may make you more likely to develop this condition:  Being age 20 or older.  Having a hardening of the arteries caused by the buildup of fat and other substances in the lining of a blood vessel (arteriosclerosis).  Having inflammation of the walls of an artery (arteritis).  Having a genetic disease that weakens the body's connective tissue, such as Marfan syndrome.  Having an injury or trauma to the aorta.  Having an infection that is caused by bacteria, such as syphilis or staphylococcus, in the wall of the aorta (infectious aortitis).  Having high blood pressure (hypertension).  Being male.  Being white (Caucasian).  Having high cholesterol.  Having a family history of aneurysms.  Using tobacco.  Having chronic obstructive pulmonary disease (COPD). What are the signs or symptoms? Symptoms of this condition vary depending on the size and rate of growth of the aneurysm. Most grow slowly and do not cause any symptoms. When symptoms do occur, they may include:  Pain in the chest, back, sides, or abdomen. The pain may vary in  intensity. A sudden onset of severe pain may indicate that the aneurysm has ruptured.  Hoarseness.  Cough.  Shortness of breath.  Swallowing problems.  Swelling in the face, arms, or legs.  Fever.  Unexplained weight loss. How is this diagnosed? This condition may be diagnosed with:  An ultrasound.  X-rays.  A CT scan.  An MRI.  Tests to check the arteries for damage or blockages (angiogram). Most unruptured thoracic aortic aneurysms cause no symptoms, so they are often found during exams for other conditions. How is this treated? Treatment for this condition depends on:  The size of the aneurysm.  How fast the aneurysm is growing.  Your age.  Risk factors for rupture. Aneurysms that are smaller than 2.2 inches (5.5 cm) may be managed by using medicines to control blood pressure, manage pain, or fight infection. You may need regular monitoring to see if the aneurysm is getting bigger. Your health care provider may recommend that you have an ultrasound every year or every 6 months. How often you need to have an ultrasound depends on the size of the aneurysm, how fast it is growing, and whether you have a family history of aneurysms. Surgical repair may be needed if your aneurysm is larger than 2.2 inches or if it is growing quickly. Follow these instructions at home: Eating and drinking  Eat a healthy diet. Your health care provider may recommend that you:  Lower your salt (sodium) intake. In some people, too much salt can raise blood pressure and increase the risk of thoracic  aortic aneurysm.  Avoid foods that are high in saturated fat and cholesterol, such as red meat and dairy.  Eat a diet that is low in sugar.  Increase your fiber intake by including whole grains, vegetables, and fruits in your diet. Eating these foods may help to lower blood pressure.  Limit or avoid alcohol as recommended by your health care provider. Lifestyle  Follow instructions from  your health care provider about healthy lifestyle habits. Your health care provider may recommend that you:  Do not use any products that contain nicotine or tobacco, such as cigarettes and e-cigarettes. If you need help quitting, ask your health care provider.  Keep your blood pressure within normal limits. The target limit for most people is below 120/80. Check your blood pressure regularly. If it is high, ask your health care provider about ways that you can control it.  Keep your blood sugar (glucose) level and cholesterol levels within normal limits. Target limits for most people are:  Blood glucose level: Less than 100 mg/dL.  Total cholesterol level: Less than 200 mg/dL.  Maintain a healthy weight. Activity  Stay physically active and exercise regularly. Talk with your health care provider about how often you should exercise and ask which types of exercise are safe for you.  Avoid heavy lifting and activities that take a lot of effort (are strenuous). Ask your health care provider what activities are safe for you. General instructions  Keep all follow-up visits as told by your health care provider. This is important.  Talk with your health care provider about regular screenings to see if the aneurysm is getting bigger.  Take over-the-counter and prescription medicines only as told by your health care provider. Contact a health care provider if:  You have discomfort in your upper back, neck, or abdomen.  You have trouble swallowing.  You have a cough or hoarseness.  You have a family history of aneurysms.  You have unexplained weight loss. Get help right away if:  You have sudden, severe pain in your upper back and abdomen. This pain may move into your chest and arms.  You have shortness of breath.  You have a fever. This information is not intended to replace advice given to you by your health care provider. Make sure you discuss any questions you have with your  health care provider. Document Released: 12/11/2005 Document Revised: 09/22/2016 Document Reviewed: 09/22/2016 Elsevier Interactive Patient Education  2017 [employer].     Abdominal Aortic Aneurysm Blood pumps away from the heart through tubes (blood vessels) called arteries. Aneurysms are weak or damaged places in the wall of an artery. It bulges out like a balloon. An abdominal aortic aneurysm happens in the main artery of the body (aorta). It can burst or tear, causing bleeding inside the body. This is an emergency. It needs treatment right away. What are the causes? The exact cause is unknown. Things that could cause this problem include:  Fat and other substances building up in the lining of a tube.  Swelling of the walls of a blood vessel.  Certain tissue diseases.  Belly (abdominal) trauma.  An infection in the main artery of the body. What increases the risk? There are things that make it more likely for you to have an aneurysm. These include:  Being over the age of 47 years old.  Having high blood pressure (hypertension).  Being a male.  Being white.  Being very overweight (obese).  Having a family history of aneurysm.  Using tobacco products. What are the signs or symptoms? Symptoms depend on the size of the aneurysm and how fast it grows. There may not be symptoms. If symptoms occur, they can include:  Pain (belly, side, lower back, or groin).  Feeling full after eating a small amount of food.  Feeling sick to your stomach (nauseous), throwing up (vomiting), or both.  Feeling a lump in your belly that feels like it is beating (pulsating).  Feeling like you will pass out (faint). How is this treated?  Medicine to control blood pressure and pain.  Imaging tests to see if the aneurysm gets bigger.  Surgery. How is this prevented? To lessen your chance of getting this condition:  Stop smoking. Stop chewing tobacco.  Limit or avoid  alcohol.  Keep your blood pressure, blood sugar, and cholesterol within normal limits.  Eat less salt.  Eat foods low in saturated fats and cholesterol. These are found in animal and whole dairy products.  Eat more fiber. Fiber is found in whole grains, vegetables, and fruits.  Keep a healthy weight.  Stay active and exercise often. This information is not intended to replace advice given to you by your health care provider. Make sure you discuss any questions you have with your health care provider. Document Released: 04/07/2013 Document Revised: 05/18/2016 Document Reviewed: 01/10/2013 Elsevier Interactive Patient Education  2017 [employer].

## 2016-12-15 NOTE — Progress Notes (Signed)
[facility] [place]  CC: Follow up s/p EVAR  History of Present Illness  [name] is a 45 y.o. (May 19, 1971) male who is s/p endovascular repair of an aortic aneurysm performed in 2013 by Kellie Simmering and Dr. Trula Slade. Patient also has chronic kidney disease stage IV and has a fistula in the left forearm which Dr. Kellie Simmering created in August 2016.    He continues to be stable with his renal kidney function and has not yet been on hemodialysis. He is followed by Dr. [name]. He denies steal symptoms in his left hand.  Pt returns today for discussion of CT abd/pelvis/chest results performed today.  He denies chest pain, denies abdominal or back pain.   He denies any history of stroke or TIA, denies claudication symptoms with walking.   Pt weighed 347 # a year ago, 370# today, states he gained weight since September this year at which time his wife was hospitalized for intensive treatment of her MS.   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   Past Medical History:  Diagnosis Date  . Abdominal aortic aneurysm ([place])   . Anemia   . Anxiety   . Aortic regurgitation 12/10/08   Echo: mild to mod. AOV regurg,mild AO root dilatation, LA mildly dilated, EF 65%, LV wall thickness markedly increased, small PE  . Chronic kidney disease   . Chronic renal disease    advanced  . DVT (deep venous thrombosis) ([place])   . H/O aortic dissection 2009   infarenal abdominal aortic dissection  . History of pulmonary embolus (PE) 2010  . Hypertension    dr [name]  . Left ventricular hypertrophy   . Obesity   . Stroke [facility]) 2009   pt says no stroke   Past Surgical History:  Procedure Laterality Date  . ABDOMINAL AORTAGRAM N/A 02/06/2012   Procedure: ABDOMINAL Maxcine Ham;  Surgeon: [name], MD;  Location: [facility] C[facility];  Service: Cardiovascular;  Laterality: N/A;  . ABDOMINAL AORTIC ANEURYSM REPAIR  09/25/2012   EVAR  . AV FISTULA  PLACEMENT Left 05/19/2015   Procedure: ARTERIOVENOUS (AV) FISTULA CREATION-LEFT RADIOCEPHALIC;  Surgeon: Mal [name], MD;  Location: [facility];  Service: Vascular;  Laterality: Left;  . CARDIAC CATHETERIZATION  02/11/10   false + Nuc  . CORNEAL TRANSPLANT    . FISTULA SUPERFICIALIZATION Left 08/04/2015   Procedure: FISTULA SUPERFICIALIZATION LEFT RADIAL CEPHALIC;  Surgeon: Mal [name], MD;  Location: [place];  Service: Vascular;  Laterality: Left;  . INTRAVASCULAR ULTRASOUND  09/25/2012   Procedure: INTRAVASCULAR ULTRASOUND;  Surgeon: Mal [name], MD;  Location: [place];  Service: Vascular;  Laterality: N/A;  . LIGATION OF COMPETING BRANCHES OF ARTERIOVENOUS FISTULA Left 08/04/2015   Procedure: LIGATION OF MULTIPLE COMPETING BRANCHES OF ARTERIOVENOUS FISTULA;  Surgeon: Mal [name], MD;  Location: [place];  Service: Vascular;  Laterality: Left;   Social History Social History  Substance Use Topics  . Smoking status: Never Smoker  . Smokeless tobacco: Never Used  . Alcohol use No   Family History Family History  Problem Relation Age of Onset  . Hypertension Mother   . Hypertension Father    Current Outpatient Prescriptions on File Prior to Visit  Medication Sig Dispense Refill  . acetaminophen (TYLENOL) 500 MG tablet Take 1,000 mg by mouth every 6 (six) hours as needed for moderate pain or headache. Reported on 12/14/2015    . allopurinol (ZYLOPRIM) 300  MG tablet TAKE 1 TABLET BY MOUTH  DAILY (Patient taking differently: TAKE 1/2 TABLET BY MOUTH  DAILY) 90 tablet 1  . atorvastatin (LIPITOR) 10 MG tablet Take 1 tablet by mouth  daily 90 tablet 2  . calcitRIOL (ROCALTROL) 0.25 MCG capsule TAKE 1 CAPSULE BY MOUTH  EVERY MONDAY, WEDNESDAY,  AND FRIDAY. 24 capsule 4  . carvedilol (COREG) 25 MG tablet Take 1 tablet (25 mg total) by mouth 2 (two) times daily. 180 tablet 3  . ELIQUIS 2.5 MG TABS tablet TAKE 1 TABLET BY MOUTH TWICE A DAY 60 tablet 6  . furosemide (LASIX) 40 MG tablet Take 1 tablet  (40 mg total) by mouth daily. 90 tablet 3  . Olmesartan-Amlodipine-HCTZ 40-10-12.5 MG TABS Take 1 tablet by mouth daily. 30 tablet    No current facility-administered medications on file prior to visit.    Allergies  Allergen Reactions  . Roxicodone [Oxycodone Hcl] Hives and Itching    Hives and itching all over     ROS: See HPI for pertinent positives and negatives.  Physical Examination  Vitals:   12/15/16 1433  BP: 132/89  Pulse: 77  Resp: 20  Temp: 97.9 F (36.6 C)  TempSrc: Oral  SpO2: 97%  Weight: (!) 370 lb (167.8 kg)  Height: 6' (1.829 m)   Body mass index is 50.18 kg/m.  General: A&O x 3, WD, morbidly obese male.  Pulmonary: Sym exp, respirations are non labored, distant breath sounds, CTAB, no rales, rhonchi, or wheezing.  Cardiac: RRR, no murmur appreciated  Vascular: Vessel Right Left  Radial 2+Palpable 2+Palpable  Carotid  without bruit  without bruit  Aorta Not palpable N/A  Femoral 1+Palpable Not Palpable  Popliteal Not palpable Not palpable  PT Not Palpable Not Palpable  DP 2+Palpable 2+Palpable   Gastrointestinal: soft, NTND, -G/R, - HSM, - palpable masses, - CVAT B, large panus.  Musculoskeletal: M/S 5/5 throughout, extremities without ischemic changes.  Neurologic: Pain and light touch intact in extremities, Motor exam as listed above.    CT Abd/Pelvis/chest w/o contrast (Date: 12-15-16): Vascular/Lymphatic: Bifurcated infrarenal aortic stent graft. Native sac diameter 4.6 x 3.9 cm, previously 4.5 x 3.2 cm by my measurement. Ectatic bilateral common iliac arteries stable.  IMPRESSION: 1. Continued enlargement of the ascending aortic aneurysm and aortic root, up to 5.9 cm diameter. 2. Slow continued enlargement of the abdominal aortic native aneurysm sac despite bifurcated aortic stent graft placement suggesting endoleak.   Medical Decision Making  [name] is a 45 y.o. male who presents s/p EVAR (Date: 09-25-12).   Pt is asymptomatic with an increase in sac size.  Native sac diameter 4.6 x 3.9 cm, previously 4.5 x 3.2 cm on non contrast CT today. Continued enlargement of the ascending aortic aneurysm and aortic root, up to 5.9 cm diameter.   The patient will follow up with Dr. Trula Slade at his next available office appointment to discuss abdominal EVAR endoleak and enlarging thoracic aortic aneurysm.   I emphasized the importance of maximal medical management including strict control of blood pressure, blood glucose, and lipid levels, antiplatelet agents, obtaining regular exercise, and cessation of smoking.   Thank you for allowing [place] to participate in this patient's care.  Clemon Chambers, RN, MSN, FNP-C [facility] [place] Office: [phone]  Clinic Physician: Scot Dock on call  12/15/2016, 2:49 PM

## 2016-12-19 ENCOUNTER — Ambulatory Visit: Payer: BLUE CROSS/BLUE SHIELD | Admitting: Family

## 2016-12-21 ENCOUNTER — Other Ambulatory Visit: Payer: Self-pay | Admitting: *Deleted

## 2016-12-21 DIAGNOSIS — I77810 Thoracic aortic ectasia: Secondary | ICD-10-CM

## 2016-12-21 NOTE — Progress Notes (Signed)
Thanks. He will be seeing Dr. Servando Snare next week to discuss the thoracic aneurysm. MCr

## 2016-12-27 ENCOUNTER — Ambulatory Visit: Payer: BLUE CROSS/BLUE SHIELD | Admitting: Family

## 2016-12-28 ENCOUNTER — Institutional Professional Consult (permissible substitution) (INDEPENDENT_AMBULATORY_CARE_PROVIDER_SITE_OTHER): Payer: Medicare Other | Admitting: Cardiothoracic Surgery

## 2016-12-28 ENCOUNTER — Encounter: Payer: Self-pay | Admitting: Cardiothoracic Surgery

## 2016-12-28 VITALS — BP 141/90 | HR 77 | Resp 16 | Ht 71.0 in | Wt 370.0 lb

## 2016-12-28 DIAGNOSIS — I712 Thoracic aortic aneurysm, without rupture, unspecified: Secondary | ICD-10-CM

## 2016-12-28 DIAGNOSIS — Z86718 Personal history of other venous thrombosis and embolism: Secondary | ICD-10-CM | POA: Diagnosis not present

## 2016-12-28 DIAGNOSIS — Z86711 Personal history of pulmonary embolism: Secondary | ICD-10-CM | POA: Diagnosis not present

## 2016-12-28 NOTE — Progress Notes (Signed)
[address]       [place] [address]             [phone]                    [name] [place] Medical Record #[id number] Date of Birth: 08-13-1971  Referring: Sanda Klein, MD Primary Care: Nyoka Cowden, MD  Chief Complaint:    Chief Complaint  Patient presents with  . TAA    CT CHEST 12/15/16    History of Present Illness:    [name] 46 y.o. male is seen in the office  today for enlargement of the ascending aortic aneurysm and aortic root. He has had known dilated root since ct of chest 2011. By my measurement : Year              Mid ascending     Root: Coronal     Sagittal  2011  42   49  49        With contrast 2013  43   50  50        No contrast 2014  45   48  48 No contrast 2017   45   49  59 (?) No contrast  Patient large body size makes non contrasted ct measurement difficulty    Patient has a history of  obesity, severe systemic hypertension,  stent graft repair for abdominal aortic aneurysm and dissection (Dr. 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 [name]) with left arm fistula in place for future dialysis when needed.  Patient is on chronic Eliquis was for history of multiple episodes of DVT and pulmonary emboli  Current Activity/ Functional Status:  Patient is independent with mobility/ambulation, transfers, ADL's, IADL's.   Zubrod Score: At the time of surgery this patient's most appropriate activity status/level should be described as: []     0    Normal activity, no symptoms []     1    Restricted in physical strenuous activity but ambulatory, able to do out light work [x]     2     Ambulatory and capable of self care, unable to do work activities, up and about               >50 % of waking hours                              []     3    Only limited self care, in bed greater than 50% of waking hours []     4    Completely disabled, no self care, confined to bed or chair []     5    Moribund   Past Medical History:  Diagnosis Date  . Abdominal aortic aneurysm ([name])   . Anemia   . Anxiety   . Aortic regurgitation 12/10/08   Echo: mild to mod. AOV regurg,mild AO root dilatation, LA mildly dilated, EF 65%, LV wall thickness markedly increased, small PE  . Chronic kidney disease   . Chronic renal disease    advanced  . DVT (deep venous thrombosis) ([name])   . H/O aortic dissection 2009  infarenal abdominal aortic dissection  . History of pulmonary embolus (PE) 2010  . Hypertension    dr [name]  . Left ventricular hypertrophy   . Obesity   . Stroke [facility]) 2009   pt says no stroke    Past Surgical History:  Procedure Laterality Date  . ABDOMINAL AORTAGRAM N/A 02/06/2012   Procedure: ABDOMINAL Maxcine Ham;  Surgeon: [name], MD;  Location: [facility] CATH LAB;  Service: Cardiovascular;  Laterality: N/A;  . ABDOMINAL AORTIC ANEURYSM REPAIR  09/25/2012   EVAR  . AV FISTULA PLACEMENT Left 05/19/2015   Procedure: ARTERIOVENOUS (AV) FISTULA CREATION-LEFT RADIOCEPHALIC;  Surgeon: Mal [name], MD;  Location: [facility];  Service: Vascular;  Laterality: Left;  . CARDIAC CATHETERIZATION  02/11/10   false + Nuc  . CORNEAL TRANSPLANT    . FISTULA SUPERFICIALIZATION Left 08/04/2015   Procedure: FISTULA SUPERFICIALIZATION LEFT RADIAL CEPHALIC;  Surgeon: Mal [name], MD;  Location: [facility];  Service: Vascular;  Laterality: Left;  . INTRAVASCULAR ULTRASOUND  09/25/2012   Procedure: INTRAVASCULAR ULTRASOUND;  Surgeon: Mal [name], MD;  Location: [facility];  Service: Vascular;  Laterality: N/A;  . LIGATION OF COMPETING BRANCHES OF ARTERIOVENOUS FISTULA Left 08/04/2015   Procedure: LIGATION OF  MULTIPLE COMPETING BRANCHES OF ARTERIOVENOUS FISTULA;  Surgeon: Mal [name], MD;  Location: [facility] OR;  Service: Vascular;  Laterality: Left;    Family History  Problem Relation Age of Onset  . Hypertension Mother   . Hypertension Father     Social History   Social History  . Marital status: Married    Spouse name: N/A  . Number of children: N/A  . Years of education: N/A   Occupational History  . Not on file.   Social History Main Topics  . Smoking status: Never Smoker  . Smokeless tobacco: Never Used  . Alcohol use No  . Drug use: No  . Sexual activity: Not Currently    Partners: Female     History  Smoking Status  . Never Smoker  Smokeless Tobacco  . Never Used    History  Alcohol Use No     Allergies  Allergen Reactions  . Roxicodone [Oxycodone Hcl] Hives and Itching    Hives and itching all over    Current Outpatient Prescriptions  Medication Sig Dispense Refill  . acetaminophen (TYLENOL) 500 MG tablet Take 1,000 mg by mouth every 6 (six) hours as needed for moderate pain or headache. Reported on 12/14/2015    . allopurinol (ZYLOPRIM) 300 MG tablet TAKE 1 TABLET BY MOUTH  DAILY (Patient taking differently: TAKE 1/2 TABLET BY MOUTH  DAILY) 90 tablet 1  . atorvastatin (LIPITOR) 10 MG tablet Take 1 tablet by mouth  daily 90 tablet 2  . calcitRIOL (ROCALTROL) 0.25 MCG capsule TAKE 1 CAPSULE BY MOUTH  EVERY MONDAY, WEDNESDAY,  AND FRIDAY. 24 capsule 4  . carvedilol (COREG) 25 MG tablet Take 1 tablet (25 mg total) by mouth 2 (two) times daily. 180 tablet 3  . ELIQUIS 2.5 MG TABS tablet TAKE 1 TABLET BY MOUTH TWICE A DAY 60 tablet 6  . furosemide (LASIX) 40 MG tablet Take 1 tablet (40 mg total) by mouth daily. (Patient taking differently: Take 40 mg by mouth daily as needed. ) 90 tablet 3  . Olmesartan-Amlodipine-HCTZ 40-10-12.5 MG TABS Take 1 tablet by mouth daily. 30 tablet    No current facility-administered medications for this visit.       Review of  Systems:  Cardiac Review of Systems: Y or N  Chest Pain [   n ]  Resting SOB Blue.Reese   ] Exertional SOB  Blue.Reese  ]  Orthopnea Blue.Reese  ]   Pedal Edema [  y ]    Palpitations Florencio.Farrier  ] Syncope  [  n]   Presyncope [ n  ]  General Review of Systems: [Y] = yes [  ]=no Constitional: recent weight change [ n ];  Wt loss over the last 3 months [   ] anorexia [  ]; fatigue Blue.Reese  ]; nausea [  ]; night sweats [  ]; fever [  ]; or chills [  ];          Dental: poor dentition[ n ]; Last Dentist visit:   Eye : blurred vision [  ]; diplopia [   ]; vision changes [  ];  Amaurosis fugax[  ]; Resp: cough [  ];  wheezing[ n ];  hemoptysis[ n ]; shortness of breath[  y]; paroxysmal nocturnal dyspnea[ n ]; dyspnea on exertion[ y ]; or orthopnea[  ];  GI:  gallstones[  ], vomiting[  ];  dysphagia[  ]; melena[  ];  hematochezia [  ]; heartburn[  ];   Hx of  Colonoscopy[  ]; GU: kidney stones [  ]; hematuria[  ];   dysuria [  ];  nocturia[  ];  history of     obstruction [  ]; urinary frequency [  ]             Skin: rash, swelling[  ];, hair loss[  ];  peripheral edema[  ];  or itching[  ]; Musculosketetal: myalgias[  ];  joint swelling[ y ];  joint erythema[ y ];  joint pain[  ];  back pain[ y ];  Heme/Lymph: bruising[  ];  bleeding[  ];  anemia[  ];  Neuro: TIA[n  ];  headaches[  ];  stroke[  ];  vertigo[  ];  seizures[  n];   paresthesias[  ];  difficulty walking[ y;  Psych:depression[  ]; anxiety[  ];  Endocrine: diabetes[  ];  thyroid dysfunction[  ];  Immunizations: Flu up to date [ y ]; Pneumococcal up to date [ y ];  Other:  Physical Exam: BP (!) 141/90 (BP Location: Right Arm, Patient Position: Sitting, Cuff Size: Large)   Pulse 77   Resp 16   Ht 6' (1.829 m)   Wt (!) 370 lb (167.8 kg)   SpO2 96% Comment: ON RA  BMI 50.18 kg/m   PHYSICAL EXAMINATION: General appearance: alert, cooperative, appears older than stated age and morbidly obese Head: Normocephalic, without obvious abnormality, atraumatic Neck: no  adenopathy, no carotid bruit, no JVD, supple, symmetrical, trachea midline and thyroid not enlarged, symmetric, no tenderness/mass/nodules Lymph nodes: Cervical, supraclavicular, and axillary nodes normal. Resp: diminished breath sounds bilaterally Back: symmetric, no curvature. ROM normal. No CVA tenderness. Cardio: regular rate and rhythm, S1, S2 normal, no murmur, click, rub or gallop GI: soft, non-tender; bowel sounds normal; no masses,  no organomegaly Extremities: extremities normal, atraumatic, no cyanosis or edema and Homans sign is negative, no sign of DVT Neurologic: Grossly normal  Diagnostic Studies & Laboratory data:     Recent Radiology Findings:     Ct Chest Wo Contrast  Result Date: 12/15/2016 CLINICAL DATA:  Pt with end stage renal disease, ordered w/o contrast. Eval for TAA. Post Evar 10/13. No pt complaints. Nonsmoker over 20 yrs. No other abd  surg and no prior chest surg. Prev CT in pacs. EXAM: CT CHEST, ABDOMEN AND PELVIS WITHOUT CONTRAST TECHNIQUE: Multidetector CT imaging of the chest, abdomen and pelvis was performed following the standard protocol without IV contrast. COMPARISON:  12/22/2015 FINDINGS: CT CHEST FINDINGS Cardiovascular: Scattered coronary calcifications. Dilated aortic root measures up to 5.9 cm diameter, previously 5.7 cm. Proximal ascending aorta 4.6 cm, tapering to 3.7 cm at the proximal arch. Descending thoracic is normal in caliber. Mediastinum/Nodes: No pericardial effusion. No mediastinal hematoma. No adenopathy localized. Lungs/Pleura: No pleural effusion.  No pneumothorax.  Lungs clear. Musculoskeletal: Minimal spurring in the lower thoracic spine. No fracture or worrisome bone lesion. CT ABDOMEN PELVIS FINDINGS Hepatobiliary: Partially calcified stone in the nondilated gallbladder. Unremarkable uninfused evaluation of liver. Pancreas: Unremarkable. No pancreatic ductal dilatation or surrounding inflammatory changes. Spleen: Normal in size without  focal abnormality. Adrenals/Urinary Tract: Normal adrenal glands. Stable 2.3 cm exophytic cystic lesion, upper pole right kidney. No nephrolithiasis or hydronephrosis. Stomach/Bowel: Stomach and small bowel nondilated. Appendix not identified. The colon is nondilated. Vascular/Lymphatic: Bifurcated infrarenal aortic stent graft. Native sac diameter 4.6 x 3.9 cm, previously 4.5 x 3.2 cm by my measurement. Ectatic bilateral common iliac arteries stable. Reproductive:  Unremarkable Other: No ascites.  No free air. Musculoskeletal: No acute or significant osseous findings. IMPRESSION: 1. Continued enlargement of the ascending aortic aneurysm and aortic root, up to 5.9 cm diameter. 2. Slow continued enlargement of the abdominal aortic native aneurysm sac despite bifurcated aortic stent graft placement suggesting endoleak. 3. Cholelithiasis Electronically Signed   By: Lucrezia Europe M.D.   On: 12/15/2016 13:09     I have independently reviewed the above radiologic studies.  Recent Lab Findings: Lab Results  Component Value Date   WBC 4.2 09/15/2013   HGB 12.2 (L) 08/04/2015   HCT 36.0 (L) 08/04/2015   PLT 245 09/15/2013   GLUCOSE 111 (H) 08/04/2015   CHOL 140 02/29/2012   TRIG 72 02/29/2012   HDL 42 02/29/2012   LDLCALC 84 02/29/2012   ALT 12 09/15/2013   AST 15 09/15/2013   NA 140 08/04/2015   K 3.6 08/04/2015   CL 107 08/20/2014   CREATININE 2.11 (H) 08/20/2014   BUN 22 08/20/2014   CO2 26 08/20/2014   TSH 2.424 Test methodology is 3rd generation TSH 12/13/2008   INR 1.09 08/04/2015   ECHO: Echocardiography  Patient:    Erasto, Sleight MR #:       [id number] Study Date: 08/29/2016 Gender:     M Age:        72 Height:     182.9 cm Weight:     162 kg BSA:        2.95 m^2 Pt. Status: Room:   ATTENDING    Mertie Moores, M.D.  SONOGRAPHER  [place], Will  [place] Croitoru, MD  REFERRING    Sanda Klein, MD  PERFORMING   [facility],  Outpatient  cc:  ------------------------------------------------------------------- LV EF: 60% -   65%  ------------------------------------------------------------------- Indications:      (I35.1).  ------------------------------------------------------------------- History:   PMH:  LVH. H/o AAA repair. Dilated aortic root. Acquired from the patient and from the patient&'s chart.  Aortic regurgitation.  Risk factors:  Hypertension. Morbidly obese.  ------------------------------------------------------------------- Study Conclusions  - Left ventricle: The cavity size was normal. Wall thickness was   increased in a pattern of moderate LVH. Systolic function was   normal. The estimated ejection fraction was in the range of  60%   to 65%. Features are consistent with a pseudonormal left   ventricular filling pattern, with concomitant abnormal relaxation   and increased filling pressure (grade 2 diastolic dysfunction). - Aortic valve: Mildly calcified annulus. There was moderate   regurgitation. - Right atrium: The atrium was mildly dilated.  ------------------------------------------------------------------- Study data:  Comparison was made to the study of 08/12/2013.  Study status:  Routine.  Procedure:  The patient reported no pain pre or post test. Transthoracic echocardiography for left ventricular function evaluation. Image quality was adequate. The study was technically difficult, as a result of body habitus.  Study completion:  There were no complications. Echocardiography.  M-mode, complete 2D, spectral Doppler, and color Doppler.  Birthdate:  Patient birthdate: 1971/09/22.  Age:  Patient is 46 yr old.  Sex:  Gender: male.    BMI: 48.4 kg/m^2.  Blood pressure:     118/88  Patient status:  Outpatient.  Study date: Study date: 08/29/2016. Study time: 08:48 AM.  Location:  [facility]  3  -------------------------------------------------------------------  ------------------------------------------------------------------- Left ventricle:  The cavity size was normal. Wall thickness was increased in a pattern of moderate LVH. Systolic function was normal. The estimated ejection fraction was in the range of 60% to 65%. Features are consistent with a pseudonormal left ventricular filling pattern, with concomitant abnormal relaxation and increased filling pressure (grade 2 diastolic dysfunction).  ------------------------------------------------------------------- Aortic valve:   Mildly calcified annulus.  Doppler:  There was moderate regurgitation.  ------------------------------------------------------------------- Aorta:  The aorta was moderately dilated. Ascending aorta: The ascending aorta was mildly dilated.  ------------------------------------------------------------------- Left atrium:  The atrium was normal in size.  ------------------------------------------------------------------- Right ventricle:  The cavity size was normal. Systolic function was normal.  ------------------------------------------------------------------- Pulmonic valve:    The valve appears to be grossly normal. Doppler:  There was no significant regurgitation.  ------------------------------------------------------------------- Tricuspid valve:   Structurally normal valve.   Leaflet separation was normal.  Doppler:  Transvalvular velocity was within the normal range. There was mild regurgitation.  ------------------------------------------------------------------- Pulmonary artery:   Systolic pressure was within the normal range.   ------------------------------------------------------------------- Right atrium:  The atrium was mildly dilated.  ------------------------------------------------------------------- Pericardium:  There was no pericardial effusion.    ------------------------------------------------------------------- Post procedure conclusions Ascending Aorta:  - The aorta was moderately dilated.  ------------------------------------------------------------------- Measurements   Left ventricle                           Value        Reference  LV ID, ED, PLAX chordal                  51.8  mm     43 - 52  LV ID, ES, PLAX chordal                  32.5  mm     23 - 38  LV fx shortening, PLAX chordal           37    %      >=29  LV PW thickness, ED                      13    mm     ---------  IVS/LV PW ratio, ED                      1.06         <=  1.3  Stroke volume, 2D                        89    ml     ---------  Stroke volume/bsa, 2D                    30    ml/m^2 ---------  LV ejection fraction, 1-p A4C            63    %      ---------  LV end-diastolic volume, 2-p             120   ml     ---------  LV end-systolic volume, 2-p              45    ml     ---------  LV ejection fraction, 2-p                63    %      ---------  Stroke volume, 2-p                       75    ml     ---------  LV end-diastolic volume/bsa, 2-p         41    ml/m^2 ---------  LV end-systolic volume/bsa, 2-p          15    ml/m^2 ---------  Stroke volume/bsa, 2-p                   25.4  ml/m^2 ---------  LV e&', lateral                           4.87  cm/s   ---------  LV E/e&', lateral                         11.36        ---------  LV e&', medial                            5.46  cm/s   ---------  LV E/e&', medial                          10.13        ---------  LV e&', average                           5.17  cm/s   ---------  LV E/e&', average                         10.71        ---------    Ventricular septum                       Value        Reference  IVS thickness, ED                        13.8  mm     ---------    LVOT  Value        Reference  LVOT ID, S                               22    mm      ---------  LVOT area                                3.8   cm^2   ---------  LVOT ID                                  22    mm     ---------  LVOT peak velocity, S                    102   cm/s   ---------  LVOT mean velocity, S                    71.2  cm/s   ---------  LVOT VTI, S                              23.3  cm     ---------  LVOT peak gradient, S                    4     mm Hg  ---------  Stroke volume (SV), LVOT DP              88.6  ml     ---------  Stroke index (SV/bsa), LVOT DP           30    ml/m^2 ---------    Aortic valve                             Value        Reference  Aortic regurg pressure half-time         562   ms     ---------    Aorta                                    Value        Reference  Aortic root ID, ED                       56    mm     ---------  Ascending aorta ID, A-P, S               40    mm     ---------    Left atrium                              Value        Reference  LA ID, A-P, ES                           45    mm     ---------  LA ID/bsa, A-P  1.52  cm/m^2 <=2.2  LA volume, S                             93    ml     ---------  LA volume/bsa, S                         31.5  ml/m^2 ---------  LA volume, ES, 1-p A4C                   84    ml     ---------  LA volume/bsa, ES, 1-p A4C               28.5  ml/m^2 ---------  LA volume, ES, 1-p A2C                   84    ml     ---------  LA volume/bsa, ES, 1-p A2C               28.5  ml/m^2 ---------    Mitral valve                             Value        Reference  Mitral E-wave peak velocity              55.3  cm/s   ---------  Mitral A-wave peak velocity              63.2  cm/s   ---------  Mitral deceleration time                 208   ms     150 - 230  Mitral E/A ratio, peak                   0.9          ---------    Pulmonary arteries                       Value        Reference  PA pressure, S, DP                       26    mm Hg  <=30    Tricuspid  valve                          Value        Reference  Tricuspid regurg peak velocity           239   cm/s   ---------  Tricuspid peak RV-RA gradient            23    mm Hg  ---------    Systemic veins                           Value        Reference  Estimated CVP                            3     mm Hg  ---------    Right ventricle  Value        Reference  RV pressure, S, DP                       26    mm Hg  <=30  RV s&', lateral, S                        18.5  cm/s   ---------  Legend: (L)  and  (H)  mark values outside specified reference range.  ------------------------------------------------------------------- Prepared and Electronically Authenticated by  Mertie Moores, M.D. 2017-09-05T10:28:33  Aortic Size Index=   5.9       /Body surface area is 2.9 meters squared. = 2.03  < 2.75 cm/m2      4% risk per year 2.75 to 4.25          8% risk per year > 4.25 cm/m2    20% risk per year  Chronic Kidney Disease   Stage I     GFR >90  Stage II    GFR 60-89  Stage IIIA GFR 45-59  Stage IIIB GFR 30-44  Stage IV   GFR 15-29  Stage V    GFR  <15  Lab Results  Component Value Date   CREATININE 2.11 (H) 08/20/2014   CrCl cannot be calculated (Patient's most recent lab result is older than the maximum 21 days allowed.).  Assessment / Plan:   1/ Enlargement of the ascending aortic aneurysm and aortic root, up to 5.9 cm diameter, however with the patient's obesity and noncontrasted CT the exact measurement and change in measurement overtime is difficult. The CT scans have been reviewed with radiology, we'll plan to repeat/or obtain current GFR and consider decreased dose MRA of the chest  2 / Moderate aortic insufficiency, with preserved LV function and without significant dilation of the ventricle. The aortic insufficiency at this point does not appear severe enough to warrant surgical intervention by itself hour but the size of the patient's aorta may be  the precipitating marker to proceed root replacement.- Obviously the measurements of size for intervention are based on usual surgical risk patients,, obviously this patient with his morbid obesity and renal insufficiency and history of multiple DVTs and PEs in the past is at high-risk for any operative intervention. He would need cardiac catheterization before proceeding with any surgical repair of his aortic root.   3/ Slow continued enlargement of the abdominal aortic native aneurysm sac despite bifurcated aortic stent graft placement suggesting endoleak.- Patient discussed with Dr. Trula Slade who is to see the patient in several weeks. The current aneurysm sac is smaller than prior to the placement of the stent. This does not need any acute intervention at this time.  4/ Chronic anticoagulation on Eliquis for recurrent DVT and pulmonary emboli  5/ morbid obesity with weight of 370 pounds BMI of 50   I  spent 60 minutes counseling the patient face to face and 50% or more the  time was spent in counseling and coordination of care. The total time spent in the appointment was 80 minutes.  Grace Isaac MD      [address] [place], [address] Office [phone]   Beeper [phone]  12/28/2016 4:30 PM

## 2016-12-28 NOTE — Patient Instructions (Signed)
It's best to avoid activities that cause grunting or straining (medically referred to as a "valsalva maneuver"). This happens when a person bears down against a closed throat to increase the strength of arm or abdominal muscles. There's often a tendency to do this when lifting heavy weights, doing sit-ups, push-ups or chin-ups, etc., but it may be harmful.    Thoracic Aortic Aneurysm An aneurysm is a bulge in an artery. It happens when blood pushes up against a weakened or damaged artery wall. A thoracic aortic aneurysm is an aneurysm that occurs in the first part of the aorta, between the heart and the diaphragm. The aorta is the main artery of the body. It supplies blood from the heart to the rest of the body. Some aneurysms may not cause symptoms or problems. However, the major concern with a thoracic aortic aneurysm is that it can enlarge and burst (rupture), or blood can flow between the layers of the wall of the aorta through a tear (aorticdissection). Both of these conditions can cause bleeding inside the body and can be life-threatening if they are not diagnosed and treated right away. What are the causes? The exact cause of this condition is not known. What increases the risk? The following factors may make you more likely to develop this condition:  Being age 17 or older.  Having a hardening of the arteries caused by the buildup of fat and other substances in the lining of a blood vessel (arteriosclerosis).  Having inflammation of the walls of an artery (arteritis).  Having a genetic disease that weakens the body's connective tissue, such as Marfan syndrome.  Having an injury or trauma to the aorta.  Having an infection that is caused by bacteria, such as syphilis or staphylococcus, in the wall of the aorta (infectious aortitis).  Having high blood pressure (hypertension).  Being male.  Being white (Caucasian).  Having high cholesterol.  Having a family history of  aneurysms.  Using tobacco.  Having chronic obstructive pulmonary disease (COPD). What are the signs or symptoms? Symptoms of this condition vary depending on the size and rate of growth of the aneurysm. Most grow slowly and do not cause any symptoms. When symptoms do occur, they may include:  Pain in the chest, back, sides, or abdomen. The pain may vary in intensity. A sudden onset of severe pain may indicate that the aneurysm has ruptured.  Hoarseness.  Cough.  Shortness of breath.  Swallowing problems.  Swelling in the face, arms, or legs.  Fever.  Unexplained weight loss. How is this diagnosed? This condition may be diagnosed with:  An ultrasound.  X-rays.  A CT scan.  An MRI.  Tests to check the arteries for damage or blockages (angiogram). Most unruptured thoracic aortic aneurysms cause no symptoms, so they are often found during exams for other conditions. How is this treated? Treatment for this condition depends on:  The size of the aneurysm.  How fast the aneurysm is growing.  Your age.  Risk factors for rupture. Aneurysms that are smaller than 2.2 inches (5.5 cm) may be managed by using medicines to control blood pressure, manage pain, or fight infection. You may need regular monitoring to see if the aneurysm is getting bigger. Your health care provider may recommend that you have an ultrasound every year or every 6 months. How often you need to have an ultrasound depends on the size of the aneurysm, how fast it is growing, and whether you have a family history of aneurysms.  Surgical repair may be needed if your aneurysm is larger than 2.2 inches or if it is growing quickly. Follow these instructions at home: Eating and drinking  Eat a healthy diet. Your health care provider may recommend that you:  Lower your salt (sodium) intake. In some people, too much salt can raise blood pressure and increase the risk of thoracic aortic aneurysm.  Avoid foods that  are high in saturated fat and cholesterol, such as red meat and dairy.  Eat a diet that is low in sugar.  Increase your fiber intake by including whole grains, vegetables, and fruits in your diet. Eating these foods may help to lower blood pressure.  Limit or avoid alcohol as recommended by your health care provider. Lifestyle  Follow instructions from your health care provider about healthy lifestyle habits. Your health care provider may recommend that you:  Do not use any products that contain nicotine or tobacco, such as cigarettes and e-cigarettes. If you need help quitting, ask your health care provider.  Keep your blood pressure within normal limits. The target limit for most people is below 120/80. Check your blood pressure regularly. If it is high, ask your health care provider about ways that you can control it.  Keep your blood sugar (glucose) level and cholesterol levels within normal limits. Target limits for most people are:  Blood glucose level: Less than 100 mg/dL.  Total cholesterol level: Less than 200 mg/dL.  Maintain a healthy weight. Activity  Stay physically active and exercise regularly. Talk with your health care provider about how often you should exercise and ask which types of exercise are safe for you.  Avoid heavy lifting and activities that take a lot of effort (are strenuous). Ask your health care provider what activities are safe for you. General instructions  Keep all follow-up visits as told by your health care provider. This is important.  Talk with your health care provider about regular screenings to see if the aneurysm is getting bigger.  Take over-the-counter and prescription medicines only as told by your health care provider. Contact a health care provider if:  You have discomfort in your upper back, neck, or abdomen.  You have trouble swallowing.  You have a cough or hoarseness.  You have a family history of aneurysms.  You have  unexplained weight loss. Get help right away if:  You have sudden, severe pain in your upper back and abdomen. This pain may move into your chest and arms.  You have shortness of breath.  You have a fever. This information is not intended to replace advice given to you by your health care provider. Make sure you discuss any questions you have with your health care provider. Document Released: 12/11/2005 Document Revised: 09/22/2016 Document Reviewed: 09/22/2016 Elsevier Interactive Patient Education  2017 [employer].  Aortic Dissection An aortic dissection is a tear in your aorta. The aorta is the main blood vessel that carries blood out of your heart to supply the rest of your body. It comes out of your heart and curves around, then goes down through your chest (thoracic aorta) and into your belly (abdominal aorta). The wall of the aorta has inner and outer layers. Aortic dissection occurs most often in the thoracic aorta. This is more likely to happen if the inner layer of the aorta has a weak spot or gets injured. As the dissection widens and blood flows through it, the aorta becomes "double-barreled." This means that one part of the aorta continues  to carry blood. However, the inner wall begins to separate from the rest of the aorta as blood flows through the tear. The torn part of the aorta fills with blood. It swells up like a balloon. This can reduce blood flow through the part of the aorta that is still working. Aortic dissection is a medical emergency. CAUSES Aortic dissection happens when there is a tear in the inner wall of the aorta. An injury or weakness can cause this tear. Sometimes the exact cause of the tear is not known. RISK FACTORS You may be at greater risk for aortic dissection if you:  Have certain medical conditions, such as uncontrolled high blood pressure or atherosclerosis.  Have a blunt injury to your chest.  Have a genetic disorder that affects the connective  tissue, such as Marfan syndrome, Turner syndrome, and Ehlers-Danlos syndrome.  Are born with a problem that affects either your aorta or your heart valve.  Have a condition that causes inflammation of blood vessels, such as giant cell arteritis.  Are male.  Are older than 48 years of age.  Use cocaine.  Smoke.  Lift very heavy weights or do other types of high-intensity resistance training. SIGNS AND SYMPTOMS  Signs and symptoms of aortic dissection may start suddenly. Changes in position may make symptoms worse. The most common symptoms are:  Severe chest pain that may feel like a tearing, stabbing, or sharp pain.  Pain that shifts to the shoulder, arm, neck, jaw, abdomen, or hips. Other symptoms may include:  Severe abdominal pain.  Trouble breathing.  Dizziness or fainting.  Nausea or vomiting.  Trouble swallowing.  Sweating a lot.  Feeling confused, dazed, anxious, or fearful. DIAGNOSIS Your health care provider may suspect aortic dissection based on your signs and symptoms and will perform a physical exam. During the physical exam, your health care provider may listen for abnormal blood flow sounds (murmurs) in your chest or your belly. You may also have your blood pressure checked to see whether it is low or whether there is a difference between the measurements in your arms and your legs. You may also have tests such as:  Electrocardiogram (ECG). This is a test that measures the electrical activity in your heart.  Chest X-ray.  CT scan or MRI.  Aortic angiogram. This test uses the injection of a dye to make it easier to see your blood vessels clearly.  Echocardiogram to study your heart using sound waves. TREATMENT It is important to treat an aortic dissection as quickly as possible. Your treatment may start as soon as your health care provider suspects aortic dissection. Treatment will depend on how severe your dissection is, where it is located, and your  overall health. Treatment options include:  Medicines to lower your blood pressure.  Surgery to remove the dissected part of your aorta and replace it with a graft.  Medical procedures to thread long, thin tubes (catheters) into the aorta (endovascular procedures). This may be done to place a graft or a balloon in the blood vessel to improve blood flow or prevent further dissection. HOME CARE INSTRUCTIONS  Work with your health care provider to keep your blood pressure under control.  Avoid activities that could cause an injury to your chest or your abdomen.  Do not smoke. If you need help quitting, ask your health care provider.  Do not participate in sports or exercises that involve lifting weights.  Keep all follow-up visits as directed by your health care provider. This  is important. SEEK MEDICAL CARE IF:  You develop any new symptoms of aortic dissection after treatment. SEEK IMMEDIATE MEDICAL CARE IF:  You have severe pain in your chest or your abdomen.  You have trouble breathing. These symptoms may represent a serious problem that is an emergency. Do not wait to see if the symptoms will go away. Get medical help right away. Call your local emergency services (911 in the [place] Do not drive yourself to the hospital.  This information is not intended to replace advice given to you by your health care provider. Make sure you discuss any questions you have with your health care provider. Document Released: 03/19/2008 Document Revised: 01/01/2015 Document Reviewed: 07/22/2014 Elsevier Interactive Patient Education  2017 [employer].

## 2016-12-29 NOTE — Progress Notes (Signed)
Thank you, Ed. He will definitely be a challenge. Gleason Ardoin

## 2017-01-04 ENCOUNTER — Other Ambulatory Visit: Payer: Self-pay | Admitting: *Deleted

## 2017-01-04 MED ORDER — OLMESARTAN-AMLODIPINE-HCTZ 40-10-12.5 MG PO TABS: 1.0000 | ORAL_TABLET | Freq: Every day | ORAL | 8 refills | Status: DC

## 2017-01-08 ENCOUNTER — Telehealth: Payer: Self-pay | Admitting: Cardiovascular Disease

## 2017-01-08 MED ORDER — OLMESARTAN-AMLODIPINE-HCTZ 40-10-12.5 MG PO TABS: 1.0000 | ORAL_TABLET | Freq: Every day | ORAL | 2 refills | Status: DC

## 2017-01-08 NOTE — Telephone Encounter (Signed)
Duplicate message. 

## 2017-01-08 NOTE — Telephone Encounter (Signed)
lmtcb

## 2017-01-08 NOTE — Telephone Encounter (Signed)
New message ° ° ° ° ° ° °Talk to the nurse regarding his medications °

## 2017-01-08 NOTE — Telephone Encounter (Signed)
New 90 day rx sent to [facility] mail [facility]  Pt notified-thankful for new rx

## 2017-01-08 NOTE — Telephone Encounter (Signed)
New Message     Needs to speak with Dr Sallyanne Kuster nurse , his medication was sent to the wrong pharmacy again.  [facility] 90 day supply, would not give prescriptions name.

## 2017-01-09 ENCOUNTER — Encounter: Payer: Self-pay | Admitting: Surgery

## 2017-01-15 ENCOUNTER — Ambulatory Visit (INDEPENDENT_AMBULATORY_CARE_PROVIDER_SITE_OTHER): Payer: Medicare Other | Admitting: Surgery

## 2017-01-15 ENCOUNTER — Encounter: Payer: Self-pay | Admitting: Surgery

## 2017-01-15 VITALS — BP 131/89 | HR 72 | Temp 97.4°F | Resp 18 | Ht 71.0 in | Wt 376.2 lb

## 2017-01-15 DIAGNOSIS — I714 Abdominal aortic aneurysm, without rupture, unspecified: Secondary | ICD-10-CM

## 2017-01-15 NOTE — Progress Notes (Signed)
[facility] [place]  Patient name: [name] MRN: [id number] DOB: 1971/04/15 Sex: male   REASON FOR VISIT:    Follow up  [name]:    [name] is a 46 y.o. male who is a former patient of Dr. Kellie Evans.  He is status post percutaneous endovascular aneurysm repair of a 5.6 centimeter infrarenal abdominal aortic aneurysm secondary to a chronic dissection on 09/25/2012.  He also has stage IV chronic renal insufficiency, status post left radiocephalic fistula creation.  He is not yet on dialysis.  His most recent noncontrast CT scan has showed a slight increase in the size of his aneurysm.  He also is being evaluated for a 5.9 cm ascending aortic aneurysm  He is on a statin for hypercholesterolemia.  He takes an ARB for hypertension.  He is a nonsmoker   PAST MEDICAL HISTORY:   Past Medical History:  Diagnosis Date  . Abdominal aortic aneurysm ([name])   . Anemia   . Anxiety   . Aortic regurgitation 12/10/08   Echo: mild to mod. AOV regurg,mild AO root dilatation, LA mildly dilated, EF 65%, LV wall thickness markedly increased, small PE  . Chronic kidney disease   . Chronic renal disease    advanced  . DVT (deep venous thrombosis) ([name])   . H/O aortic dissection 2009   infarenal abdominal aortic dissection  . History of pulmonary embolus (PE) 2010  . Hypertension    dr [name]  . Left ventricular hypertrophy   . Obesity   . Stroke [name]) 2009   pt says no stroke     FAMILY HISTORY:   Family History  Problem Relation Age of Onset  . Hypertension Mother   . Hypertension Father     SOCIAL HISTORY:   Social History  Substance Use Topics  . Smoking status: Never Smoker  . Smokeless tobacco: Never Used  . Alcohol use No     ALLERGIES:   Allergies  Allergen Reactions  . Roxicodone [Oxycodone Hcl] Hives and Itching    Hives and itching all over     CURRENT MEDICATIONS:     Current Outpatient Prescriptions  Medication Sig Dispense Refill  . acetaminophen (TYLENOL) 500 MG tablet Take 1,000 mg by mouth every 6 (six) hours as needed for moderate pain or headache. Reported on 12/14/2015    . allopurinol (ZYLOPRIM) 300 MG tablet TAKE 1 TABLET BY MOUTH  DAILY (Patient taking differently: TAKE 1/2 TABLET BY MOUTH  DAILY) 90 tablet 1  . atorvastatin (LIPITOR) 10 MG tablet Take 1 tablet by mouth  daily 90 tablet 2  . calcitRIOL (ROCALTROL) 0.25 MCG capsule TAKE 1 CAPSULE BY MOUTH  EVERY MONDAY, WEDNESDAY,  AND FRIDAY. 24 capsule 4  . carvedilol (COREG) 25 MG tablet Take 1 tablet (25 mg total) by mouth 2 (two) times daily. 180 tablet 3  . ELIQUIS 2.5 MG TABS tablet TAKE 1 TABLET BY MOUTH TWICE A DAY 60 tablet 6  . furosemide (LASIX) 40 MG tablet Take 1 tablet (40 mg total) by mouth daily. (Patient taking differently: Take 40 mg by mouth daily as needed. ) 90 tablet 3  . Olmesartan-Amlodipine-HCTZ 40-10-12.5 MG TABS Take 1 tablet by mouth daily. 90 tablet 2   No current facility-administered medications for this visit.     REVIEW OF SYSTEMS:   [X]  denotes positive finding, [ ]  denotes negative finding Cardiac  Comments:  Chest pain or chest pressure:    Shortness of  breath upon exertion:    Short of breath when lying flat:    Irregular heart rhythm:        Vascular    Pain in calf, thigh, or hip brought on by ambulation:    Pain in feet at night that wakes you up from your sleep:     Blood clot in your veins:    Leg swelling:         Pulmonary    Oxygen at home:    Productive cough:     Wheezing:         Neurologic    Sudden weakness in arms or legs:     Sudden numbness in arms or legs:     Sudden onset of difficulty speaking or slurred speech:    Temporary loss of vision in one eye:     Problems with dizziness:         Gastrointestinal    Blood in stool:     Vomited blood:         Genitourinary    Burning when urinating:     Blood in urine:         Psychiatric    Major depression:         Hematologic    Bleeding problems:    Problems with blood clotting too easily:        Skin    Rashes or ulcers:        Constitutional    Fever or chills:      PHYSICAL EXAM:   Vitals:   01/15/17 1138  BP: 131/89  Pulse: 72  Resp: 18  Temp: 97.4 F (36.3 C)  TempSrc: Oral  SpO2: 97%  Weight: (!) 376 lb 3.2 oz (170.6 kg)  Height: 5\' 11"  (1.803 m)    GENERAL: The patient is a well-nourished male, in no acute distress. The vital signs are documented above. CARDIAC: There is a regular rate and rhythm.  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 NEUROLOGIC: No focal weakness or paresthesias are detected. SKIN: There are no ulcers or rashes noted. PSYCHIATRIC: The patient has a normal affect.  STUDIES:   I have reviewed her CT scan with the following findings: 1. Continued enlargement of the ascending aortic aneurysm and aortic root, up to 5.9 cm diameter. 2. Slow continued enlargement of the abdominal aortic native aneurysm sac despite bifurcated aortic stent graft placement suggesting endoleak. 3. Cholelithiasis  MEDICAL ISSUES:   AAA:  Because of the patient's renal function, we are unable to get contrasted CT scans.  Therefore it is difficult to determine the etiology of the increase in the size of the patient's aneurysm.  Regardless, this has increased slowly over time and only measures 4.6 cm.  Therefore I think we have time to continue to manage this.  If the patient does go on to renal failure, a contrasted CT scan can help determine the etiology of the increase in size.  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  If he does, however at on a MRI of the abdomen, however I think it is likely this  will not help determine the source of the sac enlargement.  I have scheduled the patient for follow-up with a repeat noncontrasted CT scan in 6 months  Aneurysmal fistula: The patient does not have any skin  breakdown or ulcers.  We will continue to follow this.  The fistula can be used if the needs to start dialysis    Annamarie Major, MD [facility] [phone] Pager [phone]

## 2017-01-18 ENCOUNTER — Ambulatory Visit (INDEPENDENT_AMBULATORY_CARE_PROVIDER_SITE_OTHER): Payer: Medicare Other | Admitting: Cardiothoracic Surgery

## 2017-01-18 VITALS — BP 139/89 | HR 74 | Resp 16 | Ht 71.0 in | Wt 376.0 lb

## 2017-01-18 DIAGNOSIS — I714 Abdominal aortic aneurysm, without rupture, unspecified: Secondary | ICD-10-CM

## 2017-01-18 DIAGNOSIS — I712 Thoracic aortic aneurysm, without rupture, unspecified: Secondary | ICD-10-CM

## 2017-01-18 NOTE — Progress Notes (Signed)
[address]       [place] [address]             [phone]                    [name] [facility] Medical Record #[id number] Date of Birth: 1971/03/29  Referring: Jack Mitchell, MD Primary Care: Nyoka Cowden, MD  Chief Complaint:    Chief Complaint  Patient presents with  . Follow-up    after seeing Dr. Trula Slade    History of Present Illness:    [name] 46 y.o. male is seen in the office  today for enlargement of the ascending aortic aneurysm and aortic root. He has had known dilated root since ct of chest 2011. By my measurement : Year              Mid ascending     Root: Coronal     Sagittal  2011  42   49  49        With contrast 2013  43   50  50        No contrast 2014  45   48  48 No contrast 2017   45   49  59 (?) No contrast  Patient large body size makes non contrasted ct measurement difficulty    Patient has a history of  obesity, severe systemic hypertension,  stent graft repair for abdominal aortic aneurysm and dissection (Dr. 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 Justin Evans) with left arm fistula in place for future dialysis when needed.  Patient is on chronic Eliquis was for history of multiple episodes of DVT and pulmonary emboli  Current Activity/ Functional Status:  Patient is independent with mobility/ambulation, transfers, ADL's, IADL's.   Zubrod Score: At the time of surgery this patient's most appropriate activity status/level should be described as: []     0    Normal activity, no symptoms []     1    Restricted in physical strenuous activity but ambulatory, able to do out light work [x]      2    Ambulatory and capable of self care, unable to do work activities, up and about               >50 % of waking hours                              []     3    Only limited self care, in bed greater than 50% of waking hours []     4    Completely disabled, no self care, confined to bed or chair []     5    Moribund   Past Medical History:  Diagnosis Date  . Abdominal aortic aneurysm ([place])   . Anemia   . Anxiety   . Aortic regurgitation 12/10/08   Echo: mild to mod. AOV regurg,mild AO root dilatation, LA mildly dilated, EF 65%, LV wall thickness markedly increased, small PE  . Chronic kidney disease   . Chronic renal disease    advanced  . DVT (deep venous thrombosis) ([place])   . H/O aortic dissection  2009   infarenal abdominal aortic dissection  . History of pulmonary embolus (PE) 2010  . Hypertension    dr [name]  . Left ventricular hypertrophy   . Obesity   . Stroke [facility]) 2009   pt says no stroke    Past Surgical History:  Procedure Laterality Date  . ABDOMINAL AORTAGRAM N/A 02/06/2012   Procedure: ABDOMINAL Maxcine Ham;  Surgeon: Jack Mitchell, MD;  Location: [facility] CATH LAB;  Service: Cardiovascular;  Laterality: N/A;  . ABDOMINAL AORTIC ANEURYSM REPAIR  09/25/2012   EVAR  . AV FISTULA PLACEMENT Left 05/19/2015   Procedure: ARTERIOVENOUS (AV) FISTULA CREATION-LEFT RADIOCEPHALIC;  Surgeon: Mal [name], MD;  Location: [place];  Service: Vascular;  Laterality: Left;  . CARDIAC CATHETERIZATION  02/11/10   false + Nuc  . CORNEAL TRANSPLANT    . FISTULA SUPERFICIALIZATION Left 08/04/2015   Procedure: FISTULA SUPERFICIALIZATION LEFT RADIAL CEPHALIC;  Surgeon: Mal [name], MD;  Location: [place];  Service: Vascular;  Laterality: Left;  . INTRAVASCULAR ULTRASOUND  09/25/2012   Procedure: INTRAVASCULAR ULTRASOUND;  Surgeon: Mal [name], MD;  Location: [place];  Service: Vascular;  Laterality: N/A;  . LIGATION OF COMPETING BRANCHES OF ARTERIOVENOUS FISTULA Left 08/04/2015   Procedure:  LIGATION OF MULTIPLE COMPETING BRANCHES OF ARTERIOVENOUS FISTULA;  Surgeon: Mal [name], MD;  Location: [facility] OR;  Service: Vascular;  Laterality: Left;    Family History  Problem Relation Age of Onset  . Hypertension Mother   . Hypertension Father     Social History   Social History  . Marital status: Married    Spouse name: N/A  . Number of children: N/A  . Years of education: N/A   Occupational History  . Not on file.   Social History Main Topics  . Smoking status: Never Smoker  . Smokeless tobacco: Never Used  . Alcohol use No  . Drug use: No  . Sexual activity: Not Currently    Partners: Female     History  Smoking Status  . Never Smoker  Smokeless Tobacco  . Never Used    History  Alcohol Use No     Allergies  Allergen Reactions  . Roxicodone [Oxycodone Hcl] Hives and Itching    Hives and itching all over    Current Outpatient Prescriptions  Medication Sig Dispense Refill  . acetaminophen (TYLENOL) 500 MG tablet Take 1,000 mg by mouth every 6 (six) hours as needed for moderate pain or headache. Reported on 12/14/2015    . allopurinol (ZYLOPRIM) 300 MG tablet TAKE 1 TABLET BY MOUTH  DAILY (Patient taking differently: TAKE 1/2 TABLET BY MOUTH  DAILY) 90 tablet 1  . atorvastatin (LIPITOR) 10 MG tablet Take 1 tablet by mouth  daily 90 tablet 2  . calcitRIOL (ROCALTROL) 0.25 MCG capsule TAKE 1 CAPSULE BY MOUTH  EVERY MONDAY, WEDNESDAY,  AND FRIDAY. 24 capsule 4  . carvedilol (COREG) 25 MG tablet Take 1 tablet (25 mg total) by mouth 2 (two) times daily. 180 tablet 3  . ELIQUIS 2.5 MG TABS tablet TAKE 1 TABLET BY MOUTH TWICE A DAY 60 tablet 6  . furosemide (LASIX) 40 MG tablet Take 1 tablet (40 mg total) by mouth daily. (Patient taking differently: Take 40 mg by mouth daily as needed. ) 90 tablet 3  . Olmesartan-Amlodipine-HCTZ 40-10-12.5 MG TABS Take 1 tablet by mouth daily. 90 tablet 2   No current facility-administered medications for this visit.        Review of Systems:  Cardiac Review of Systems: Y or N  Chest Pain [   n ]  Resting SOB Blue.Reese   ] Exertional SOB  Blue.Reese  ]  Orthopnea Blue.Reese  ]   Pedal Edema [  y ]    Palpitations Florencio.Farrier  ] Syncope  [  n]   Presyncope [ n  ]  General Review of Systems: [Y] = yes [  ]=no Constitional: recent weight change [ n ];  Wt loss over the last 3 months [   ] anorexia [  ]; fatigue Blue.Reese  ]; nausea [  ]; night sweats [  ]; fever [  ]; or chills [  ];          Dental: poor dentition[ n ]; Last Dentist visit:   Eye : blurred vision [  ]; diplopia [   ]; vision changes [  ];  Amaurosis fugax[  ]; Resp: cough [  ];  wheezing[ n ];  hemoptysis[ n ]; shortness of breath[  y]; paroxysmal nocturnal dyspnea[ n ]; dyspnea on exertion[ y ]; or orthopnea[  ];  GI:  gallstones[  ], vomiting[  ];  dysphagia[  ]; melena[  ];  hematochezia [  ]; heartburn[  ];   Hx of  Colonoscopy[  ]; GU: kidney stones [  ]; hematuria[  ];   dysuria [  ];  nocturia[  ];  history of     obstruction [  ]; urinary frequency [  ]             Skin: rash, swelling[  ];, hair loss[  ];  peripheral edema[  ];  or itching[  ]; Musculosketetal: myalgias[  ];  joint swelling[ y ];  joint erythema[ y ];  joint pain[  ];  back pain[ y ];  Heme/Lymph: bruising[  ];  bleeding[  ];  anemia[  ];  Neuro: TIA[n  ];  headaches[  ];  stroke[  ];  vertigo[  ];  seizures[  n];   paresthesias[  ];  difficulty walking[ y;  Psych:depression[  ]; anxiety[  ];  Endocrine: diabetes[  ];  thyroid dysfunction[  ];  Immunizations: Flu up to date [ y ]; Pneumococcal up to date [ y ];  Other:  Physical Exam: BP 139/89 (BP Location: Right Arm, Patient Position: Sitting, Cuff Size: Large)   Pulse 74   Resp 16   Ht 5\' 11"  (1.803 m)   Wt (!) 376 lb (170.6 kg)   SpO2 97% Comment: ON RA  BMI 52.44 kg/m   PHYSICAL EXAMINATION: General appearance: alert, cooperative, appears older than stated age and morbidly obese Head: Normocephalic, without obvious abnormality,  atraumatic Neck: no adenopathy, no carotid bruit, no JVD, supple, symmetrical, trachea midline and thyroid not enlarged, symmetric, no tenderness/mass/nodules Lymph nodes: Cervical, supraclavicular, and axillary nodes normal. Resp: diminished breath sounds bilaterally Back: symmetric, no curvature. ROM normal. No CVA tenderness. Cardio: regular rate and rhythm, S1, S2 normal, no murmur, click, rub or gallop GI: soft, non-tender; bowel sounds normal; no masses,  no organomegaly Extremities: extremities normal, atraumatic, no cyanosis or edema and Homans sign is negative, no sign of DVT Neurologic: Grossly normal  Diagnostic Studies & Laboratory data:     Recent Radiology Findings:     Ct Chest Wo Contrast  Result Date: 12/15/2016 CLINICAL DATA:  Pt with end stage renal disease, ordered w/o contrast. Eval for TAA. Post Evar 10/13. No pt complaints. Nonsmoker over 20 yrs. No other abd  surg and no prior chest surg. Prev CT in pacs. EXAM: CT CHEST, ABDOMEN AND PELVIS WITHOUT CONTRAST TECHNIQUE: Multidetector CT imaging of the chest, abdomen and pelvis was performed following the standard protocol without IV contrast. COMPARISON:  12/22/2015 FINDINGS: CT CHEST FINDINGS Cardiovascular: Scattered coronary calcifications. Dilated aortic root measures up to 5.9 cm diameter, previously 5.7 cm. Proximal ascending aorta 4.6 cm, tapering to 3.7 cm at the proximal arch. Descending thoracic is normal in caliber. Mediastinum/Nodes: No pericardial effusion. No mediastinal hematoma. No adenopathy localized. Lungs/Pleura: No pleural effusion.  No pneumothorax.  Lungs clear. Musculoskeletal: Minimal spurring in the lower thoracic spine. No fracture or worrisome bone lesion. CT ABDOMEN PELVIS FINDINGS Hepatobiliary: Partially calcified stone in the nondilated gallbladder. Unremarkable uninfused evaluation of liver. Pancreas: Unremarkable. No pancreatic ductal dilatation or surrounding inflammatory changes. Spleen:  Normal in size without focal abnormality. Adrenals/Urinary Tract: Normal adrenal glands. Stable 2.3 cm exophytic cystic lesion, upper pole right kidney. No nephrolithiasis or hydronephrosis. Stomach/Bowel: Stomach and small bowel nondilated. Appendix not identified. The colon is nondilated. Vascular/Lymphatic: Bifurcated infrarenal aortic stent graft. Native sac diameter 4.6 x 3.9 cm, previously 4.5 x 3.2 cm by my measurement. Ectatic bilateral common iliac arteries stable. Reproductive:  Unremarkable Other: No ascites.  No free air. Musculoskeletal: No acute or significant osseous findings. IMPRESSION: 1. Continued enlargement of the ascending aortic aneurysm and aortic root, up to 5.9 cm diameter. 2. Slow continued enlargement of the abdominal aortic native aneurysm sac despite bifurcated aortic stent graft placement suggesting endoleak. 3. Cholelithiasis Electronically Signed   By: Lucrezia Europe M.D.   On: 12/15/2016 13:09     I have independently reviewed the above radiologic studies.  Recent Lab Findings: Lab Results  Component Value Date   WBC 4.2 09/15/2013   HGB 12.2 (L) 08/04/2015   HCT 36.0 (L) 08/04/2015   PLT 245 09/15/2013   GLUCOSE 111 (H) 08/04/2015   CHOL 140 02/29/2012   TRIG 72 02/29/2012   HDL 42 02/29/2012   LDLCALC 84 02/29/2012   ALT 12 09/15/2013   AST 15 09/15/2013   NA 140 08/04/2015   K 3.6 08/04/2015   CL 107 08/20/2014   CREATININE 2.11 (H) 08/20/2014   BUN 22 08/20/2014   CO2 26 08/20/2014   TSH 2.424 Test methodology is 3rd generation TSH 12/13/2008   INR 1.09 08/04/2015   ECHO: Echocardiography  Patient:    Ja, Ohman MR #:       [id number] Study Date: 08/29/2016 Gender:     M Age:        27 Height:     182.9 cm Weight:     162 kg BSA:        2.95 m^2 Pt. Status: Room:   ATTENDING    Mertie Moores, M.D.  SONOGRAPHER  [place], [place]  Trinway Croitoru, MD  REFERRING    Sanda Klein, MD  PERFORMING   [facility],  Outpatient  cc:  ------------------------------------------------------------------- LV EF: 60% -   65%  ------------------------------------------------------------------- Indications:      (I35.1).  ------------------------------------------------------------------- History:   PMH:  LVH. H/o AAA repair. Dilated aortic root. Acquired from the patient and from the patient&'s chart.  Aortic regurgitation.  Risk factors:  Hypertension. Morbidly obese.  ------------------------------------------------------------------- Study Conclusions  - Left ventricle: The cavity size was normal. Wall thickness was   increased in a pattern of moderate LVH. Systolic function was   normal. The estimated ejection fraction was in the range of  60%   to 65%. Features are consistent with a pseudonormal left   ventricular filling pattern, with concomitant abnormal relaxation   and increased filling pressure (grade 2 diastolic dysfunction). - Aortic valve: Mildly calcified annulus. There was moderate   regurgitation. - Right atrium: The atrium was mildly dilated.  ------------------------------------------------------------------- Study data:  Comparison was made to the study of 08/12/2013.  Study status:  Routine.  Procedure:  The patient reported no pain pre or post test. Transthoracic echocardiography for left ventricular function evaluation. Image quality was adequate. The study was technically difficult, as a result of body habitus.  Study completion:  There were no complications. Echocardiography.  M-mode, complete 2D, spectral Doppler, and color Doppler.  Birthdate:  Patient birthdate: [birth year].  Age:  Patient is 46 yr old.  Sex:  Gender: male.    BMI: 48.4 kg/m^2.  Blood pressure:     118/88  Patient status:  Outpatient.  Study date: Study date: 08/29/2016. Study time: 08:48 AM.  Location:  [facility]  3  -------------------------------------------------------------------  ------------------------------------------------------------------- Left ventricle:  The cavity size was normal. Wall thickness was increased in a pattern of moderate LVH. Systolic function was normal. The estimated ejection fraction was in the range of 60% to 65%. Features are consistent with a pseudonormal left ventricular filling pattern, with concomitant abnormal relaxation and increased filling pressure (grade 2 diastolic dysfunction).  ------------------------------------------------------------------- Aortic valve:   Mildly calcified annulus.  Doppler:  There was moderate regurgitation.  ------------------------------------------------------------------- Aorta:  The aorta was moderately dilated. Ascending aorta: The ascending aorta was mildly dilated.  ------------------------------------------------------------------- Left atrium:  The atrium was normal in size.  ------------------------------------------------------------------- Right ventricle:  The cavity size was normal. Systolic function was normal.  ------------------------------------------------------------------- Pulmonic valve:    The valve appears to be grossly normal. Doppler:  There was no significant regurgitation.  ------------------------------------------------------------------- Tricuspid valve:   Structurally normal valve.   Leaflet separation was normal.  Doppler:  Transvalvular velocity was within the normal range. There was mild regurgitation.  ------------------------------------------------------------------- Pulmonary artery:   Systolic pressure was within the normal range.   ------------------------------------------------------------------- Right atrium:  The atrium was mildly dilated.  ------------------------------------------------------------------- Pericardium:  There was no pericardial effusion.    ------------------------------------------------------------------- Post procedure conclusions Ascending Aorta:  - The aorta was moderately dilated.  ------------------------------------------------------------------- Measurements   Left ventricle                           Value        Reference  LV ID, ED, PLAX chordal                  51.8  mm     43 - 52  LV ID, ES, PLAX chordal                  32.5  mm     23 - 38  LV fx shortening, PLAX chordal           37    %      >=29  LV PW thickness, ED                      13    mm     ---------  IVS/LV PW ratio, ED                      1.06         <=  1.3  Stroke volume, 2D                        89    ml     ---------  Stroke volume/bsa, 2D                    30    ml/m^2 ---------  LV ejection fraction, 1-p A4C            63    %      ---------  LV end-diastolic volume, 2-p             120   ml     ---------  LV end-systolic volume, 2-p              45    ml     ---------  LV ejection fraction, 2-p                63    %      ---------  Stroke volume, 2-p                       75    ml     ---------  LV end-diastolic volume/bsa, 2-p         41    ml/m^2 ---------  LV end-systolic volume/bsa, 2-p          15    ml/m^2 ---------  Stroke volume/bsa, 2-p                   25.4  ml/m^2 ---------  LV e&', lateral                           4.87  cm/s   ---------  LV E/e&', lateral                         11.36        ---------  LV e&', medial                            5.46  cm/s   ---------  LV E/e&', medial                          10.13        ---------  LV e&', average                           5.17  cm/s   ---------  LV E/e&', average                         10.71        ---------    Ventricular septum                       Value        Reference  IVS thickness, ED                        13.8  mm     ---------    LVOT  Value        Reference  LVOT ID, S                               22    mm      ---------  LVOT area                                3.8   cm^2   ---------  LVOT ID                                  22    mm     ---------  LVOT peak velocity, S                    102   cm/s   ---------  LVOT mean velocity, S                    71.2  cm/s   ---------  LVOT VTI, S                              23.3  cm     ---------  LVOT peak gradient, S                    4     mm Hg  ---------  Stroke volume (SV), LVOT DP              88.6  ml     ---------  Stroke index (SV/bsa), LVOT DP           30    ml/m^2 ---------    Aortic valve                             Value        Reference  Aortic regurg pressure half-time         562   ms     ---------    Aorta                                    Value        Reference  Aortic root ID, ED                       56    mm     ---------  Ascending aorta ID, A-P, S               40    mm     ---------    Left atrium                              Value        Reference  LA ID, A-P, ES                           45    mm     ---------  LA ID/bsa, A-P  1.52  cm/m^2 <=2.2  LA volume, S                             93    ml     ---------  LA volume/bsa, S                         31.5  ml/m^2 ---------  LA volume, ES, 1-p A4C                   84    ml     ---------  LA volume/bsa, ES, 1-p A4C               28.5  ml/m^2 ---------  LA volume, ES, 1-p A2C                   84    ml     ---------  LA volume/bsa, ES, 1-p A2C               28.5  ml/m^2 ---------    Mitral valve                             Value        Reference  Mitral E-wave peak velocity              55.3  cm/s   ---------  Mitral A-wave peak velocity              63.2  cm/s   ---------  Mitral deceleration time                 208   ms     150 - 230  Mitral E/A ratio, peak                   0.9          ---------    Pulmonary arteries                       Value        Reference  PA pressure, S, DP                       26    mm Hg  <=30    Tricuspid  valve                          Value        Reference  Tricuspid regurg peak velocity           239   cm/s   ---------  Tricuspid peak RV-RA gradient            23    mm Hg  ---------    Systemic veins                           Value        Reference  Estimated CVP                            3     mm Hg  ---------    Right ventricle  Value        Reference  RV pressure, S, DP                       26    mm Hg  <=30  RV s&', lateral, S                        18.5  cm/s   ---------  Legend: (L)  and  (H)  mark values outside specified reference range.  ------------------------------------------------------------------- Prepared and Electronically Authenticated by  Mertie Moores, M.D. 2017-09-05T10:28:33  Aortic Size Index=   5.9       /Body surface area is 2.92 meters squared. = 2.03  < 2.75 cm/m2      4% risk per year 2.75 to 4.25          8% risk per year > 4.25 cm/m2    20% risk per year  Chronic Kidney Disease   Stage I     GFR >90  Stage II    GFR 60-89  Stage IIIA GFR 45-59  Stage IIIB GFR 30-44  Stage IV   GFR 15-29  Stage V    GFR  <15  Lab Results  Component Value Date   CREATININE 2.11 (H) 08/20/2014   CrCl cannot be calculated (Patient's most recent lab result is older than the maximum 21 days allowed.).  Assessment / Plan:   1/ Enlargement of the ascending aortic aneurysm and aortic root, up to 5.9 cm diameter, however with the patient's obesity and noncontrasted CT the exact measurement and change in measurement overtime is difficult. The CT scans have been reviewed with radiology, Scans discussed with radiology. Recommended MRA of the chest with reduced dose contrast protocol. This is been ordered we'll see the patient following scan  2 / Moderate aortic insufficiency, with preserved LV function and without significant dilation of the ventricle. The aortic insufficiency at this point does not appear severe enough to warrant surgical  intervention by itself hour but the size of the patient's aorta may be the precipitating marker to proceed root replacement.- Obviously the measurements of size for intervention are based on usual surgical risk patients,, obviously this patient with his morbid obesity and renal insufficiency and history of multiple DVTs and PEs in the past is at high-risk for any operative intervention. He would need cardiac catheterization before proceeding with any surgical repair of his aortic root.   3/ Slow continued enlargement of the abdominal aortic native aneurysm sac despite bifurcated aortic stent graft placement suggesting endoleak.- Patient discussed with Dr. Trula Slade who has seen the patient  This does not need any acute intervention at this time.  4/ Chronic anticoagulation on Eliquis for recurrent DVT and pulmonary emboli  5/ morbid obesity with weight of 370 pounds BMI of 50       [address] [place] [address] Office [phone]   Beeper [phone]  01/18/2017 4:34 PM

## 2017-01-18 NOTE — Addendum Note (Signed)
Addended by: Lianne Cure A on: 01/18/2017 04:39 PM   Modules accepted: Orders

## 2017-01-19 ENCOUNTER — Other Ambulatory Visit: Payer: Self-pay | Admitting: *Deleted

## 2017-01-19 DIAGNOSIS — I712 Thoracic aortic aneurysm, without rupture: Secondary | ICD-10-CM

## 2017-01-19 DIAGNOSIS — I7121 Aneurysm of the ascending aorta, without rupture: Secondary | ICD-10-CM

## 2017-01-22 ENCOUNTER — Other Ambulatory Visit: Payer: Self-pay | Admitting: *Deleted

## 2017-01-22 ENCOUNTER — Ambulatory Visit
Admission: RE | Admit: 2017-01-22 | Discharge: 2017-01-22 | Disposition: A | Discharge status: Home / Self Care | Payer: Medicare Other | Source: Ambulatory Visit | Attending: Cardiothoracic Surgery | Admitting: Cardiothoracic Surgery

## 2017-01-22 DIAGNOSIS — I7121 Aneurysm of the ascending aorta, without rupture: Secondary | ICD-10-CM

## 2017-01-22 DIAGNOSIS — I712 Thoracic aortic aneurysm, without rupture: Secondary | ICD-10-CM

## 2017-01-25 ENCOUNTER — Encounter: Payer: Self-pay | Admitting: Cardiovascular Disease

## 2017-01-30 ENCOUNTER — Ambulatory Visit (HOSPITAL_COMMUNITY)
Admission: RE | Admit: 2017-01-30 | Discharge: 2017-01-30 | Disposition: A | Discharge status: Home / Self Care | Payer: Medicare Other | Source: Ambulatory Visit | Attending: Cardiothoracic Surgery | Admitting: Cardiothoracic Surgery

## 2017-01-30 ENCOUNTER — Other Ambulatory Visit: Payer: Self-pay | Admitting: Cardiothoracic Surgery

## 2017-01-30 ENCOUNTER — Encounter (HOSPITAL_COMMUNITY): Payer: Self-pay

## 2017-01-30 ENCOUNTER — Other Ambulatory Visit (HOSPITAL_COMMUNITY): Payer: Self-pay | Admitting: *Deleted

## 2017-01-30 DIAGNOSIS — I7121 Aneurysm of the ascending aorta, without rupture: Secondary | ICD-10-CM

## 2017-01-30 DIAGNOSIS — I712 Thoracic aortic aneurysm, without rupture: Secondary | ICD-10-CM | POA: Diagnosis not present

## 2017-01-30 DIAGNOSIS — Z01812 Encounter for preprocedural laboratory examination: Secondary | ICD-10-CM | POA: Diagnosis not present

## 2017-01-30 LAB — CREATININE, SERUM
Creatinine, Ser: 2.91 mg/dL — ABNORMAL HIGH (ref 0.61–1.24)
GFR calc Af Amer: 28 mL/min — ABNORMAL LOW (ref >60)
GFR calc non Af Amer: 24 mL/min — ABNORMAL LOW (ref >60)

## 2017-01-31 ENCOUNTER — Encounter: Payer: Self-pay | Admitting: Cardiothoracic Surgery

## 2017-01-31 ENCOUNTER — Ambulatory Visit (INDEPENDENT_AMBULATORY_CARE_PROVIDER_SITE_OTHER): Payer: Medicare Other | Admitting: Cardiothoracic Surgery

## 2017-01-31 VITALS — BP 134/88 | HR 84 | Resp 20 | Ht 71.0 in | Wt 376.0 lb

## 2017-01-31 DIAGNOSIS — I712 Thoracic aortic aneurysm, without rupture, unspecified: Secondary | ICD-10-CM

## 2017-01-31 DIAGNOSIS — I714 Abdominal aortic aneurysm, without rupture, unspecified: Secondary | ICD-10-CM

## 2017-01-31 NOTE — Progress Notes (Signed)
[address]       [name],[name]             [phone]                    [name]  Medical Record #[id number] Date of Birth: 1971-06-29  Referring: [name], MD Primary Care: Nyoka Cowden, MD  Chief Complaint:    Chief Complaint  Patient presents with  . Thoracic Aortic Aneurysm    further discuss enlargement of ascending aortic aneurysm and aortic root, MRA Chest 01/30/2017     History of Present Illness:    [name] 46 y.o. male is seen in the office  today for enlargement of the ascending aortic aneurysm and aortic root. He has had known dilated root since ct of chest 2011. By my measurement : Year              Mid ascending     Root: Coronal     Sagittal  2011  42   49  49        With contrast 2013  43   50  50        No contrast 2014  45   48  48 No contrast 2017   45   49  59 (?) No contrast  Patient large body size makes non contrasted ct measurement difficulty    Patient has a history of  obesity, severe systemic hypertension,  stent graft repair for abdominal aortic aneurysm and dissection (Dr. 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 [name]) with left arm fistula in place for future dialysis when needed.  Patient is on chronic Eliquis was for history of multiple episodes of DVT and pulmonary emboli  Current Activity/ Functional Status:  Patient is independent with mobility/ambulation, transfers, ADL's, IADL's.   Zubrod Score: At the time of surgery this patient's most appropriate activity status/level should be described as: []     0    Normal activity, no symptoms [x]     1     Restricted in physical strenuous activity but ambulatory, able to do out light work []     2    Ambulatory and capable of self care, unable to do work activities, up and about               >50 % of waking hours                              []     3    Only limited self care, in bed greater than 50% of waking hours []     4    Completely disabled, no self care, confined to bed or chair []     5    Moribund   Past Medical History:  Diagnosis Date  . Abdominal aortic aneurysm ([place])   . Anemia   . Anxiety   . Aortic regurgitation 12/10/08   Echo: mild to mod. AOV regurg,mild AO root dilatation, LA mildly dilated, EF 65%, LV wall thickness markedly increased, small PE  . Chronic kidney disease   . Chronic renal disease    advanced  .  DVT (deep venous thrombosis) ([place])   . H/O aortic dissection 2009   infarenal abdominal aortic dissection  . History of pulmonary embolus (PE) 2010  . Hypertension    dr [name]  . Left ventricular hypertrophy   . Obesity   . Stroke [facility] 2009   pt says no stroke    Past Surgical History:  Procedure Laterality Date  . ABDOMINAL AORTAGRAM N/A 02/06/2012   Procedure: ABDOMINAL Maxcine Ham;  Surgeon: [name], MD;  Location: [facility] CATH LAB;  Service: Cardiovascular;  Laterality: N/A;  . ABDOMINAL AORTIC ANEURYSM REPAIR  09/25/2012   EVAR  . AV FISTULA PLACEMENT Left 05/19/2015   Procedure: ARTERIOVENOUS (AV) FISTULA CREATION-LEFT RADIOCEPHALIC;  Surgeon: Mal [name], MD;  Location: [place];  Service: Vascular;  Laterality: Left;  . CARDIAC CATHETERIZATION  02/11/10   false + Nuc  . CORNEAL TRANSPLANT    . FISTULA SUPERFICIALIZATION Left 08/04/2015   Procedure: FISTULA SUPERFICIALIZATION LEFT RADIAL CEPHALIC;  Surgeon: Mal [name], MD;  Location: [place];  Service: Vascular;  Laterality: Left;  . INTRAVASCULAR ULTRASOUND  09/25/2012   Procedure: INTRAVASCULAR ULTRASOUND;  Surgeon: Mal [name], MD;  Location: [place];  Service: Vascular;  Laterality: N/A;  .  LIGATION OF COMPETING BRANCHES OF ARTERIOVENOUS FISTULA Left 08/04/2015   Procedure: LIGATION OF MULTIPLE COMPETING BRANCHES OF ARTERIOVENOUS FISTULA;  Surgeon: Mal [name], MD;  Location: [facility] OR;  Service: Vascular;  Laterality: Left;    Family History  Problem Relation Age of Onset  . Hypertension Mother   . Hypertension Father     Social History   Social History  . Marital status: Married    Spouse name: N/A  . Number of children: N/A  . Years of education: N/A   Occupational History  . Not on file.   Social History Main Topics  . Smoking status: Never Smoker  . Smokeless tobacco: Never Used  . Alcohol use No  . Drug use: No  . Sexual activity: Not Currently    Partners: Female     History  Smoking Status  . Never Smoker  Smokeless Tobacco  . Never Used    History  Alcohol Use No     Allergies  Allergen Reactions  . Roxicodone [Oxycodone Hcl] Hives and Itching    Hives and itching all over    Current Outpatient Prescriptions  Medication Sig Dispense Refill  . acetaminophen (TYLENOL) 500 MG tablet Take 1,000 mg by mouth every 6 (six) hours as needed for moderate pain or headache. Reported on 12/14/2015    . allopurinol (ZYLOPRIM) 300 MG tablet TAKE 1 TABLET BY MOUTH  DAILY (Patient taking differently: TAKE 1/2 TABLET BY MOUTH  DAILY) 90 tablet 1  . atorvastatin (LIPITOR) 10 MG tablet Take 1 tablet by mouth  daily 90 tablet 2  . calcitRIOL (ROCALTROL) 0.25 MCG capsule TAKE 1 CAPSULE BY MOUTH  EVERY MONDAY, WEDNESDAY,  AND FRIDAY. 24 capsule 4  . carvedilol (COREG) 25 MG tablet Take 1 tablet (25 mg total) by mouth 2 (two) times daily. 180 tablet 3  . ELIQUIS 2.5 MG TABS tablet TAKE 1 TABLET BY MOUTH TWICE A DAY 60 tablet 6  . furosemide (LASIX) 40 MG tablet Take 1 tablet (40 mg total) by mouth daily. (Patient taking differently: Take 40 mg by mouth daily as needed. ) 90 tablet 3  . Olmesartan-Amlodipine-HCTZ 40-10-12.5 MG TABS Take 1 tablet by mouth daily. 90  tablet 2   No current facility-administered medications for this  visit.       Review of Systems:     Cardiac Review of Systems: Y or N  Chest Pain [   n ]  Resting SOB Blue.Reese   ] Exertional SOB  Blue.Reese  ]  Orthopnea Blue.Reese  ]   Pedal Edema [  y ]    Palpitations Florencio.Farrier  ] Syncope  [  n]   Presyncope [ n  ]  General Review of Systems: [Y] = yes [  ]=no Constitional: recent weight change [ n ];  Wt loss over the last 3 months [   ] anorexia [  ]; fatigue Blue.Reese  ]; nausea [  ]; night sweats [  ]; fever [  ]; or chills [  ];          Dental: poor dentition[ n ]; Last Dentist visit:   Eye : blurred vision [  ]; diplopia [   ]; vision changes [  ];  Amaurosis fugax[  ]; Resp: cough [  ];  wheezing[ n ];  hemoptysis[ n ]; shortness of breath[  y]; paroxysmal nocturnal dyspnea[ n ]; dyspnea on exertion[ y ]; or orthopnea[  ];  GI:  gallstones[  ], vomiting[  ];  dysphagia[  ]; melena[  ];  hematochezia [  ]; heartburn[  ];   Hx of  Colonoscopy[  ]; GU: kidney stones [  ]; hematuria[  ];   dysuria [  ];  nocturia[  ];  history of     obstruction [  ]; urinary frequency [  ]             Skin: rash, swelling[  ];, hair loss[  ];  peripheral edema[  ];  or itching[  ]; Musculosketetal: myalgias[  ];  joint swelling[ y ];  joint erythema[ y ];  joint pain[  ];  back pain[ y ];  Heme/Lymph: bruising[  ];  bleeding[  ];  anemia[  ];  Neuro: TIA[n  ];  headaches[  ];  stroke[  ];  vertigo[  ];  seizures[  n];   paresthesias[  ];  difficulty walking[ y;  Psych:depression[  ]; anxiety[  ];  Endocrine: diabetes[  ];  thyroid dysfunction[  ];  Immunizations: Flu up to date [ y ]; Pneumococcal up to date [ y ];  Other:  Physical Exam: BP 134/88   Pulse 84   Resp 20   Ht 5\' 11"  (1.803 m)   Wt (!) 376 lb (170.6 kg)   SpO2 95% Comment: RA  BMI 52.44 kg/m   PHYSICAL EXAMINATION: General appearance: alert, cooperative, appears older than stated age and morbidly obese Head: Normocephalic, without obvious abnormality,  atraumatic Neck: no adenopathy, no carotid bruit, no JVD, supple, symmetrical, trachea midline and thyroid not enlarged, symmetric, no tenderness/mass/nodules Lymph nodes: Cervical, supraclavicular, and axillary nodes normal. Resp: diminished breath sounds bilaterally Back: symmetric, no curvature. ROM normal. No CVA tenderness. Cardio: regular rate and rhythm, S1, S2 normal, no murmur, click, rub or gallop GI: soft, non-tender; bowel sounds normal; no masses,  no organomegaly Extremities: extremities normal, atraumatic, no cyanosis or edema and Homans sign is negative, no sign of DVT Neurologic: Grossly normal  Diagnostic Studies & Laboratory data:     Recent Radiology Findings:   Mr Jodene Nam Chest Wo Contrast  Result Date: 01/31/2017 CLINICAL DATA:  46 year old with ascending thoracic aortic aneurysm. EXAM: MRA CHEST WITHOUT CONTRAST TECHNIQUE: Angiographic images of the chest were obtained using MRA technique without  intravenous contrast. CONTRAST:  None COMPARISON:  Chest CT 12/15/2016.  Echo report from 08/29/2016 FINDINGS: VASCULAR Aorta: The ascending aorta is not well demonstrated on this examination due to the lack of contrast and motion artifact. However, there is marked dilatation of the aortic root. Aortic root measures roughly up to 6.1 cm on sequence 9, image 13. Mid ascending thoracic aorta measures roughly 4.2 cm. Mid descending thoracic aorta measures 3.2 cm. Heart: Heart size is normal.  No significant pericardial fluid. Pulmonary Arteries:  Limited evaluation. NON-VASCULAR Again noted is a cyst in the right kidney upper pole. There are gallstones. No large pleural effusions. IMPRESSION: Aneurysmal dilatation of the ascending thoracic aorta. The aortic root measures at least 6.1 cm. Mid ascending thoracic aorta measures roughly 4.2 cm. Electronically Signed   By: Markus Daft M.D.   On: 01/31/2017 10:57   Ct Chest Wo Contrast  Result Date: 12/15/2016 CLINICAL DATA:  Pt with end stage  renal disease, ordered w/o contrast. Eval for TAA. Post Evar 10/13. No pt complaints. Nonsmoker over 20 yrs. No other abd surg and no prior chest surg. Prev CT in pacs. EXAM: CT CHEST, ABDOMEN AND PELVIS WITHOUT CONTRAST TECHNIQUE: Multidetector CT imaging of the chest, abdomen and pelvis was performed following the standard protocol without IV contrast. COMPARISON:  12/22/2015 FINDINGS: CT CHEST FINDINGS Cardiovascular: Scattered coronary calcifications. Dilated aortic root measures up to 5.9 cm diameter, previously 5.7 cm. Proximal ascending aorta 4.6 cm, tapering to 3.7 cm at the proximal arch. Descending thoracic is normal in caliber. Mediastinum/Nodes: No pericardial effusion. No mediastinal hematoma. No adenopathy localized. Lungs/Pleura: No pleural effusion.  No pneumothorax.  Lungs clear. Musculoskeletal: Minimal spurring in the lower thoracic spine. No fracture or worrisome bone lesion. CT ABDOMEN PELVIS FINDINGS Hepatobiliary: Partially calcified stone in the nondilated gallbladder. Unremarkable uninfused evaluation of liver. Pancreas: Unremarkable. No pancreatic ductal dilatation or surrounding inflammatory changes. Spleen: Normal in size without focal abnormality. Adrenals/Urinary Tract: Normal adrenal glands. Stable 2.3 cm exophytic cystic lesion, upper pole right kidney. No nephrolithiasis or hydronephrosis. Stomach/Bowel: Stomach and small bowel nondilated. Appendix not identified. The colon is nondilated. Vascular/Lymphatic: Bifurcated infrarenal aortic stent graft. Native sac diameter 4.6 x 3.9 cm, previously 4.5 x 3.2 cm by my measurement. Ectatic bilateral common iliac arteries stable. Reproductive:  Unremarkable Other: No ascites.  No free air. Musculoskeletal: No acute or significant osseous findings. IMPRESSION: 1. Continued enlargement of the ascending aortic aneurysm and aortic root, up to 5.9 cm diameter. 2. Slow continued enlargement of the abdominal aortic native aneurysm sac despite  bifurcated aortic stent graft placement suggesting endoleak. 3. Cholelithiasis Electronically Signed   By: Lucrezia Europe M.D.   On: 12/15/2016 13:09      I have independently reviewed the above  cath films and reviewed the findings with the  patient .   Recent Lab Findings: Lab Results  Component Value Date   WBC 4.2 09/15/2013   HGB 12.2 (L) 08/04/2015   HCT 36.0 (L) 08/04/2015   PLT 245 09/15/2013   GLUCOSE 111 (H) 08/04/2015   CHOL 140 02/29/2012   TRIG 72 02/29/2012   HDL 42 02/29/2012   LDLCALC 84 02/29/2012   ALT 12 09/15/2013   AST 15 09/15/2013   NA 140 08/04/2015   K 3.6 08/04/2015   CL 107 08/20/2014   CREATININE 2.11 (H) 08/20/2014   BUN 22 08/20/2014   CO2 26 08/20/2014   TSH 2.424 Test methodology is 3rd generation TSH 12/13/2008   INR 1.09 08/04/2015  ECHO: Echocardiography  Patient:    [name], [name] MR #:       [id number] Study Date: 08/29/2016 Gender:     M Age:        63 Height:     182.9 cm Weight:     162 kg BSA:        2.95 m^2 Pt. Status: Room:   ATTENDING    Mertie Moores, M.D.  SONOGRAPHER  [place], Will  [place] Croitoru, MD  REFERRING    Sanda Klein, MD  PERFORMING   [facility], Outpatient  cc:  ------------------------------------------------------------------- LV EF: 60% -   65%  ------------------------------------------------------------------- Indications:      (I35.1).  ------------------------------------------------------------------- History:   PMH:  LVH. H/o AAA repair. Dilated aortic root. Acquired from the patient and from the patient&'s chart.  Aortic regurgitation.  Risk factors:  Hypertension. Morbidly obese.  ------------------------------------------------------------------- Study Conclusions  - Left ventricle: The cavity size was normal. Wall thickness was   increased in a pattern of moderate LVH. Systolic function was   normal. The estimated ejection fraction was in the range of 60%    to 65%. Features are consistent with a pseudonormal left   ventricular filling pattern, with concomitant abnormal relaxation   and increased filling pressure (grade 2 diastolic dysfunction). - Aortic valve: Mildly calcified annulus. There was moderate   regurgitation. - Right atrium: The atrium was mildly dilated.  ------------------------------------------------------------------- Study data:  Comparison was made to the study of 08/12/2013.  Study status:  Routine.  Procedure:  The patient reported no pain pre or post test. Transthoracic echocardiography for left ventricular function evaluation. Image quality was adequate. The study was technically difficult, as a result of body habitus.  Study completion:  There were no complications. Echocardiography.  M-mode, complete 2D, spectral Doppler, and color Doppler.  Birthdate:  Patient birthdate: Apr 18, 1971.  Age:  Patient is 46 yr old.  Sex:  Gender: male.    BMI: 48.4 kg/m^2.  Blood pressure:     118/88  Patient status:  Outpatient.  Study date: Study date: 08/29/2016. Study time: 08:48 AM.  Location:  [facility]  -------------------------------------------------------------------  ------------------------------------------------------------------- Left ventricle:  The cavity size was normal. Wall thickness was increased in a pattern of moderate LVH. Systolic function was normal. The estimated ejection fraction was in the range of 60% to 65%. Features are consistent with a pseudonormal left ventricular filling pattern, with concomitant abnormal relaxation and increased filling pressure (grade 2 diastolic dysfunction).  ------------------------------------------------------------------- Aortic valve:   Mildly calcified annulus.  Doppler:  There was moderate regurgitation.  ------------------------------------------------------------------- Aorta:  The aorta was moderately dilated. Ascending aorta: The ascending aorta  was mildly dilated.  ------------------------------------------------------------------- Left atrium:  The atrium was normal in size.  ------------------------------------------------------------------- Right ventricle:  The cavity size was normal. Systolic function was normal.  ------------------------------------------------------------------- Pulmonic valve:    The valve appears to be grossly normal. Doppler:  There was no significant regurgitation.  ------------------------------------------------------------------- Tricuspid valve:   Structurally normal valve.   Leaflet separation was normal.  Doppler:  Transvalvular velocity was within the normal range. There was mild regurgitation.  ------------------------------------------------------------------- Pulmonary artery:   Systolic pressure was within the normal range.   ------------------------------------------------------------------- Right atrium:  The atrium was mildly dilated.  ------------------------------------------------------------------- Pericardium:  There was no pericardial effusion.   ------------------------------------------------------------------- Post procedure conclusions Ascending Aorta:  - The aorta was moderately dilated.  ------------------------------------------------------------------- Measurements   Left ventricle  Value        Reference  LV ID, ED, PLAX chordal                  51.8  mm     43 - 52  LV ID, ES, PLAX chordal                  32.5  mm     23 - 38  LV fx shortening, PLAX chordal           37    %      >=29  LV PW thickness, ED                      13    mm     ---------  IVS/LV PW ratio, ED                      1.06         <=1.3  Stroke volume, 2D                        89    ml     ---------  Stroke volume/bsa, 2D                    30    ml/m^2 ---------  LV ejection fraction, 1-p A4C            63    %      ---------  LV end-diastolic  volume, 2-p             120   ml     ---------  LV end-systolic volume, 2-p              45    ml     ---------  LV ejection fraction, 2-p                63    %      ---------  Stroke volume, 2-p                       75    ml     ---------  LV end-diastolic volume/bsa, 2-p         41    ml/m^2 ---------  LV end-systolic volume/bsa, 2-p          15    ml/m^2 ---------  Stroke volume/bsa, 2-p                   25.4  ml/m^2 ---------  LV e&', lateral                           4.87  cm/s   ---------  LV E/e&', lateral                         11.36        ---------  LV e&', medial                            5.46  cm/s   ---------  LV E/e&', medial                          10.13        ---------  LV e&', average                           5.17  cm/s   ---------  LV E/e&', average                         10.71        ---------    Ventricular septum                       Value        Reference  IVS thickness, ED                        13.8  mm     ---------    LVOT                                     Value        Reference  LVOT ID, S                               22    mm     ---------  LVOT area                                3.8   cm^2   ---------  LVOT ID                                  22    mm     ---------  LVOT peak velocity, S                    102   cm/s   ---------  LVOT mean velocity, S                    71.2  cm/s   ---------  LVOT VTI, S                              23.3  cm     ---------  LVOT peak gradient, S                    4     mm Hg  ---------  Stroke volume (SV), LVOT DP              88.6  ml     ---------  Stroke index (SV/bsa), LVOT DP           30    ml/m^2 ---------    Aortic valve                             Value        Reference  Aortic regurg pressure half-time         562   ms     ---------    Aorta  Value        Reference  Aortic root ID, ED                       56    mm     ---------  Ascending aorta ID, A-P, S                40    mm     ---------    Left atrium                              Value        Reference  LA ID, A-P, ES                           45    mm     ---------  LA ID/bsa, A-P                           1.52  cm/m^2 <=2.2  LA volume, S                             93    ml     ---------  LA volume/bsa, S                         31.5  ml/m^2 ---------  LA volume, ES, 1-p A4C                   84    ml     ---------  LA volume/bsa, ES, 1-p A4C               28.5  ml/m^2 ---------  LA volume, ES, 1-p A2C                   84    ml     ---------  LA volume/bsa, ES, 1-p A2C               28.5  ml/m^2 ---------    Mitral valve                             Value        Reference  Mitral E-wave peak velocity              55.3  cm/s   ---------  Mitral A-wave peak velocity              63.2  cm/s   ---------  Mitral deceleration time                 208   ms     150 - 230  Mitral E/A ratio, peak                   0.9          ---------    Pulmonary arteries                       Value        Reference  PA pressure, S, DP  26    mm Hg  <=30    Tricuspid valve                          Value        Reference  Tricuspid regurg peak velocity           239   cm/s   ---------  Tricuspid peak RV-RA gradient            23    mm Hg  ---------    Systemic veins                           Value        Reference  Estimated CVP                            3     mm Hg  ---------    Right ventricle                          Value        Reference  RV pressure, S, DP                       26    mm Hg  <=30  RV s&', lateral, S                        18.5  cm/s   ---------  Legend: (L)  and  (H)  mark values outside specified reference range.  ------------------------------------------------------------------- Prepared and Electronically Authenticated by  Mertie Moores, M.D. 2017-09-05T10:28:33      Aortic Size Index=   5.9       /Body surface area is 2.92 meters squared. =  2.03  < 2.75 cm/m2      4% risk per year 2.75 to 4.25          8% risk per year > 4.25 cm/m2    20% risk per year  Chronic Kidney Disease   Stage I     GFR >90  Stage II    GFR 60-89  Stage IIIA GFR 45-59  Stage IIIB GFR 30-44  Stage IV   GFR 15-29  Stage V    GFR  <15  Lab Results  Component Value Date   CREATININE 2.91 (H) 01/30/2017   Estimated Creatinine Clearance: 49.8 mL/min (by C-G formula based on SCr of 2.91 mg/dL (H)).  Assessment / Plan:   1/ Enlargement of the ascending aortic aneurysm and aortic root, up to 5.9 cm diameter, however with the patient's obesity and noncontrasted CT the exact measurement and change in measurement overtime is difficult.   2 / Moderate aortic insufficiency, with preserved LV function and without significant dilation of the ventricle. The aortic insufficiency at this point does not appear severe enough to warrant surgical intervention by itself, the size of the patient's aorta may be the precipitating factor to proceed root replacement.-  obviously this patient with his morbid obesity and renal insufficiency and history of multiple DVTs and PEs in the past is at increased for any operative intervention. He would need cardiac catheterization before proceeding with any surgical repair of his aortic root.   3/ Slow continued enlargement of the abdominal aortic native aneurysm sac despite bifurcated aortic stent graft  placement suggesting endoleak.- Patient discussed with Dr. Trula Slade who has seen the patient  This does not need any acute intervention at this time.  4/ Chronic anticoagulation on Eliquis for recurrent DVT and pulmonary emboli  5/ morbid obesity with weight of 370 pounds BMI of 50  I discussed with the patient is wife and mother in detail the risks of aortic root replacement, and the risk of aortic dissection and/or progressive aortic insufficiency without surgery. With his increased risk associated with surgical intervention including  requiring dialysis,  risk of repeat pulmonary emboli , and limited mobility due to his obesity makes the timing for elective intervention less clear. I've recommended to the patient that an aggressive weight loss increase his odds of successful surgery. At the urging of his mother he would like to obtain a second opinion. His wife has her multiple sclerosis care at [facility] and would prefer to go there. We'll make arrangements to send his previous CT scans echocardiogram and current MRA to Dr. Ysidro Evert for second opinion. I'll plan to see the patient back in one month and further discuss timing of surgery and timing of cardiac catheterization.           [address] [place] [address] Office [phone]   Beeper [phone]  01/31/2017 5:38 PM

## 2017-02-05 ENCOUNTER — Telehealth: Payer: Self-pay | Admitting: Pharmacist

## 2017-02-05 NOTE — Telephone Encounter (Signed)
Samples of Eliquis 2.5mg  were given to the patient, quantity 14 tabs, Lot Number [id number]; EXP SEP/2018  Patient also given Eliquis patient assistance paperwork to complete.

## 2017-02-05 NOTE — Telephone Encounter (Signed)
-----   Message from Sanda Klein, MD sent at 01/26/2017  1:56 PM EST ----- Other than warfarin (which involves at least monthly blood tests, numerous drug interactions and food interactions) there is no alternative drug that will be any cheaper. With your kidney function, Eliquis is actually the only one of the newer drugs that is appropriate.  It may be that the co-pay is only this high in January, because of the beginning of the year deductible. I suspect that once you reach the deductible the co-pay will go back to the typical $40 or so. Please check with your pharmacist and see if that's the case. Also, make sure you are using a co-pay card (you can use this unless you're using Medicare part D or Medicaid). Our office can give you one if you don't already have one. Sincerely, Dr. Loletha Grayer ===View-only below this line===   ----- Message -----    From: Jenne Pane. Peasley    Sent: 01/25/2017 10:28 PM EST      To: Sanda Klein, MD Subject: Non-Urgent Medical Question  Is there another medication i can take besides eliquis? The copay is $ 215.00 a month and i can't afford it.

## 2017-02-07 ENCOUNTER — Other Ambulatory Visit: Payer: Self-pay | Admitting: Pharmacist

## 2017-02-07 MED ORDER — APIXABAN 2.5 MG PO TABS: 2.5000 mg | ORAL_TABLET | Freq: Two times a day (BID) | ORAL | 11 refills | Status: DC

## 2017-02-16 ENCOUNTER — Telehealth: Payer: Self-pay | Admitting: Cardiovascular Disease

## 2017-02-16 NOTE — Telephone Encounter (Signed)
Patient states his Rx is $215 for eliquis He states his application should take 72 hours before he gets a response.  He has 5 days left of medication  Contacted Mindy @ [employer] [employer] office and they have eliquis 2.5mg  samples they can provide to patient.  Patient aware.

## 2017-02-16 NOTE — Telephone Encounter (Signed)
New message    Patient calling the office for samples of medication:   1.  What medication and dosage are you requesting samples for? Eliquis 2.5   2.  Are you currently out of this medication?  Not yet    Waiting on approval of his application , would like enough samples to get him through til application is approved

## 2017-02-26 DIAGNOSIS — I719 Aortic aneurysm of unspecified site, without rupture: Secondary | ICD-10-CM | POA: Diagnosis not present

## 2017-03-01 ENCOUNTER — Encounter: Payer: Medicare Other | Admitting: Cardiothoracic Surgery

## 2017-03-07 ENCOUNTER — Other Ambulatory Visit: Payer: Self-pay | Admitting: Internal Medicine

## 2017-03-12 DIAGNOSIS — I714 Abdominal aortic aneurysm, without rupture: Secondary | ICD-10-CM | POA: Diagnosis not present

## 2017-03-12 DIAGNOSIS — I351 Nonrheumatic aortic (valve) insufficiency: Secondary | ICD-10-CM | POA: Diagnosis not present

## 2017-03-12 DIAGNOSIS — E785 Hyperlipidemia, unspecified: Secondary | ICD-10-CM | POA: Diagnosis not present

## 2017-03-12 DIAGNOSIS — Z7901 Long term (current) use of anticoagulants: Secondary | ICD-10-CM | POA: Diagnosis not present

## 2017-03-12 DIAGNOSIS — R931 Abnormal findings on diagnostic imaging of heart and coronary circulation: Secondary | ICD-10-CM | POA: Diagnosis not present

## 2017-03-12 DIAGNOSIS — I77810 Thoracic aortic ectasia: Secondary | ICD-10-CM | POA: Diagnosis not present

## 2017-03-12 DIAGNOSIS — M109 Gout, unspecified: Secondary | ICD-10-CM | POA: Diagnosis not present

## 2017-03-12 DIAGNOSIS — N184 Chronic kidney disease, stage 4 (severe): Secondary | ICD-10-CM | POA: Diagnosis not present

## 2017-03-12 DIAGNOSIS — I129 Hypertensive chronic kidney disease with stage 1 through stage 4 chronic kidney disease, or unspecified chronic kidney disease: Secondary | ICD-10-CM | POA: Diagnosis not present

## 2017-03-12 DIAGNOSIS — I719 Aortic aneurysm of unspecified site, without rupture: Secondary | ICD-10-CM | POA: Diagnosis not present

## 2017-03-12 DIAGNOSIS — Z79899 Other long term (current) drug therapy: Secondary | ICD-10-CM | POA: Diagnosis not present

## 2017-03-12 DIAGNOSIS — I712 Thoracic aortic aneurysm, without rupture: Secondary | ICD-10-CM | POA: Diagnosis not present

## 2017-03-12 DIAGNOSIS — Z87891 Personal history of nicotine dependence: Secondary | ICD-10-CM | POA: Diagnosis not present

## 2017-03-12 DIAGNOSIS — N250 Renal osteodystrophy: Secondary | ICD-10-CM | POA: Diagnosis not present

## 2017-03-12 DIAGNOSIS — K219 Gastro-esophageal reflux disease without esophagitis: Secondary | ICD-10-CM | POA: Diagnosis not present

## 2017-03-12 DIAGNOSIS — K8020 Calculus of gallbladder without cholecystitis without obstruction: Secondary | ICD-10-CM | POA: Diagnosis not present

## 2017-03-12 DIAGNOSIS — Z8679 Personal history of other diseases of the circulatory system: Secondary | ICD-10-CM | POA: Diagnosis not present

## 2017-03-12 DIAGNOSIS — Z86718 Personal history of other venous thrombosis and embolism: Secondary | ICD-10-CM | POA: Diagnosis not present

## 2017-03-12 DIAGNOSIS — I517 Cardiomegaly: Secondary | ICD-10-CM | POA: Diagnosis not present

## 2017-03-12 DIAGNOSIS — Z86711 Personal history of pulmonary embolism: Secondary | ICD-10-CM | POA: Diagnosis not present

## 2017-04-26 ENCOUNTER — Ambulatory Visit (INDEPENDENT_AMBULATORY_CARE_PROVIDER_SITE_OTHER): Payer: Medicare Other | Admitting: Family Medicine

## 2017-04-26 ENCOUNTER — Encounter: Payer: Self-pay | Admitting: Family Medicine

## 2017-04-26 VITALS — BP 140/82 | HR 79 | Temp 98.5°F | Ht 71.0 in | Wt 377.0 lb

## 2017-04-26 DIAGNOSIS — J209 Acute bronchitis, unspecified: Secondary | ICD-10-CM

## 2017-04-26 MED ORDER — AZITHROMYCIN 250 MG PO TABS: ORAL_TABLET | ORAL | 0 refills | Status: DC

## 2017-04-26 NOTE — Progress Notes (Signed)
   Subjective:    Patient ID: [name], male    DOB: 06-08-1971, 46 y.o.   MRN: [id number]  HPI Here for 3 days of chest congestion and a dry cough. No fever.    Review of Systems  Constitutional: Negative.   HENT: Negative.   Eyes: Negative.   Respiratory: Positive for cough and chest tightness. Negative for shortness of breath and wheezing.        Objective:   Physical Exam  Constitutional: He appears well-developed and well-nourished.  HENT:  Right Ear: External ear normal.  Left Ear: External ear normal.  Nose: Nose normal.  Mouth/Throat: Oropharynx is clear and moist.  Eyes: Conjunctivae are normal.  Neck: No thyromegaly present.  Pulmonary/Chest: Effort normal. No respiratory distress. He has no wheezes. He has no rales.  Scattered rhonchi   Lymphadenopathy:    He has no cervical adenopathy.          Assessment & Plan:  Bronchitis, treat with a Zpack.  Alysia Penna, MD

## 2017-05-06 DIAGNOSIS — E785 Hyperlipidemia, unspecified: Secondary | ICD-10-CM | POA: Diagnosis not present

## 2017-05-06 DIAGNOSIS — I711 Thoracic aortic aneurysm, ruptured: Secondary | ICD-10-CM | POA: Diagnosis not present

## 2017-05-06 DIAGNOSIS — Z87891 Personal history of nicotine dependence: Secondary | ICD-10-CM | POA: Diagnosis not present

## 2017-05-06 DIAGNOSIS — J90 Pleural effusion, not elsewhere classified: Secondary | ICD-10-CM | POA: Diagnosis not present

## 2017-05-06 DIAGNOSIS — M109 Gout, unspecified: Secondary | ICD-10-CM | POA: Diagnosis not present

## 2017-05-06 DIAGNOSIS — Z86711 Personal history of pulmonary embolism: Secondary | ICD-10-CM | POA: Diagnosis not present

## 2017-05-06 DIAGNOSIS — I719 Aortic aneurysm of unspecified site, without rupture: Secondary | ICD-10-CM | POA: Diagnosis not present

## 2017-05-06 DIAGNOSIS — K219 Gastro-esophageal reflux disease without esophagitis: Secondary | ICD-10-CM | POA: Diagnosis not present

## 2017-05-06 DIAGNOSIS — D72829 Elevated white blood cell count, unspecified: Secondary | ICD-10-CM | POA: Diagnosis not present

## 2017-05-06 DIAGNOSIS — I712 Thoracic aortic aneurysm, without rupture: Secondary | ICD-10-CM | POA: Diagnosis not present

## 2017-05-06 DIAGNOSIS — Z86718 Personal history of other venous thrombosis and embolism: Secondary | ICD-10-CM | POA: Diagnosis not present

## 2017-05-06 DIAGNOSIS — Z7901 Long term (current) use of anticoagulants: Secondary | ICD-10-CM | POA: Diagnosis not present

## 2017-05-06 DIAGNOSIS — Z4682 Encounter for fitting and adjustment of non-vascular catheter: Secondary | ICD-10-CM | POA: Diagnosis not present

## 2017-05-06 DIAGNOSIS — J939 Pneumothorax, unspecified: Secondary | ICD-10-CM | POA: Diagnosis not present

## 2017-05-06 DIAGNOSIS — N184 Chronic kidney disease, stage 4 (severe): Secondary | ICD-10-CM | POA: Diagnosis not present

## 2017-05-06 DIAGNOSIS — R918 Other nonspecific abnormal finding of lung field: Secondary | ICD-10-CM | POA: Diagnosis not present

## 2017-05-06 DIAGNOSIS — I714 Abdominal aortic aneurysm, without rupture: Secondary | ICD-10-CM | POA: Diagnosis not present

## 2017-05-06 DIAGNOSIS — Z9889 Other specified postprocedural states: Secondary | ICD-10-CM | POA: Diagnosis not present

## 2017-05-06 DIAGNOSIS — I351 Nonrheumatic aortic (valve) insufficiency: Secondary | ICD-10-CM | POA: Diagnosis not present

## 2017-05-06 DIAGNOSIS — Z452 Encounter for adjustment and management of vascular access device: Secondary | ICD-10-CM | POA: Diagnosis not present

## 2017-05-06 DIAGNOSIS — I129 Hypertensive chronic kidney disease with stage 1 through stage 4 chronic kidney disease, or unspecified chronic kidney disease: Secondary | ICD-10-CM | POA: Diagnosis not present

## 2017-05-06 DIAGNOSIS — I7101 Dissection of thoracic aorta: Secondary | ICD-10-CM | POA: Diagnosis not present

## 2017-05-06 DIAGNOSIS — J9811 Atelectasis: Secondary | ICD-10-CM | POA: Diagnosis not present

## 2017-05-06 DIAGNOSIS — J9383 Other pneumothorax: Secondary | ICD-10-CM | POA: Diagnosis not present

## 2017-05-06 DIAGNOSIS — I517 Cardiomegaly: Secondary | ICD-10-CM | POA: Diagnosis not present

## 2017-05-07 DIAGNOSIS — I351 Nonrheumatic aortic (valve) insufficiency: Secondary | ICD-10-CM | POA: Diagnosis not present

## 2017-05-07 DIAGNOSIS — I719 Aortic aneurysm of unspecified site, without rupture: Secondary | ICD-10-CM | POA: Diagnosis not present

## 2017-05-07 HISTORY — PX: AORTIC ROOT REPLACEMENT: SHX1178

## 2017-05-09 ENCOUNTER — Other Ambulatory Visit: Payer: Self-pay | Admitting: Internal Medicine

## 2017-05-11 ENCOUNTER — Telehealth (HOSPITAL_COMMUNITY): Payer: Self-pay

## 2017-05-11 NOTE — Telephone Encounter (Signed)
Paper referral received from [facility]. Patient insurance is active and insurance benefits verified. Patient has [place] - $20.00 co-payment, no deductible, out of pocket $6700/$1445 has been met, no co-insurance, no pre-authorization and no limit on visits. Passport/reference [phone].

## 2017-05-14 DIAGNOSIS — I129 Hypertensive chronic kidney disease with stage 1 through stage 4 chronic kidney disease, or unspecified chronic kidney disease: Secondary | ICD-10-CM | POA: Diagnosis not present

## 2017-05-14 DIAGNOSIS — I10 Essential (primary) hypertension: Secondary | ICD-10-CM | POA: Diagnosis not present

## 2017-05-14 DIAGNOSIS — R269 Unspecified abnormalities of gait and mobility: Secondary | ICD-10-CM | POA: Diagnosis not present

## 2017-05-14 DIAGNOSIS — Z9889 Other specified postprocedural states: Secondary | ICD-10-CM | POA: Diagnosis not present

## 2017-05-15 ENCOUNTER — Other Ambulatory Visit: Payer: Self-pay | Admitting: Internal Medicine

## 2017-05-15 MED ORDER — CALCITRIOL 0.25 MCG PO CAPS: ORAL_CAPSULE | ORAL | 4 refills | Status: DC

## 2017-05-16 DIAGNOSIS — Z9889 Other specified postprocedural states: Secondary | ICD-10-CM | POA: Diagnosis not present

## 2017-05-16 DIAGNOSIS — I10 Essential (primary) hypertension: Secondary | ICD-10-CM | POA: Diagnosis not present

## 2017-05-16 DIAGNOSIS — R269 Unspecified abnormalities of gait and mobility: Secondary | ICD-10-CM | POA: Diagnosis not present

## 2017-05-16 DIAGNOSIS — I129 Hypertensive chronic kidney disease with stage 1 through stage 4 chronic kidney disease, or unspecified chronic kidney disease: Secondary | ICD-10-CM | POA: Diagnosis not present

## 2017-05-17 DIAGNOSIS — R269 Unspecified abnormalities of gait and mobility: Secondary | ICD-10-CM | POA: Diagnosis not present

## 2017-05-17 DIAGNOSIS — Z9889 Other specified postprocedural states: Secondary | ICD-10-CM | POA: Diagnosis not present

## 2017-05-17 DIAGNOSIS — I719 Aortic aneurysm of unspecified site, without rupture: Secondary | ICD-10-CM | POA: Diagnosis not present

## 2017-05-17 DIAGNOSIS — I351 Nonrheumatic aortic (valve) insufficiency: Secondary | ICD-10-CM | POA: Diagnosis not present

## 2017-05-17 DIAGNOSIS — I10 Essential (primary) hypertension: Secondary | ICD-10-CM | POA: Diagnosis not present

## 2017-05-17 DIAGNOSIS — I129 Hypertensive chronic kidney disease with stage 1 through stage 4 chronic kidney disease, or unspecified chronic kidney disease: Secondary | ICD-10-CM | POA: Diagnosis not present

## 2017-05-18 DIAGNOSIS — I129 Hypertensive chronic kidney disease with stage 1 through stage 4 chronic kidney disease, or unspecified chronic kidney disease: Secondary | ICD-10-CM | POA: Diagnosis not present

## 2017-05-18 DIAGNOSIS — R269 Unspecified abnormalities of gait and mobility: Secondary | ICD-10-CM | POA: Diagnosis not present

## 2017-05-18 DIAGNOSIS — I10 Essential (primary) hypertension: Secondary | ICD-10-CM | POA: Diagnosis not present

## 2017-05-18 DIAGNOSIS — Z9889 Other specified postprocedural states: Secondary | ICD-10-CM | POA: Diagnosis not present

## 2017-05-22 DIAGNOSIS — R269 Unspecified abnormalities of gait and mobility: Secondary | ICD-10-CM | POA: Diagnosis not present

## 2017-05-22 DIAGNOSIS — Z9889 Other specified postprocedural states: Secondary | ICD-10-CM | POA: Diagnosis not present

## 2017-05-22 DIAGNOSIS — I10 Essential (primary) hypertension: Secondary | ICD-10-CM | POA: Diagnosis not present

## 2017-05-22 DIAGNOSIS — I129 Hypertensive chronic kidney disease with stage 1 through stage 4 chronic kidney disease, or unspecified chronic kidney disease: Secondary | ICD-10-CM | POA: Diagnosis not present

## 2017-05-24 ENCOUNTER — Telehealth: Payer: Self-pay | Admitting: Cardiovascular Disease

## 2017-05-24 ENCOUNTER — Telehealth: Payer: Self-pay | Admitting: Internal Medicine

## 2017-05-24 DIAGNOSIS — I10 Essential (primary) hypertension: Secondary | ICD-10-CM | POA: Diagnosis not present

## 2017-05-24 DIAGNOSIS — I129 Hypertensive chronic kidney disease with stage 1 through stage 4 chronic kidney disease, or unspecified chronic kidney disease: Secondary | ICD-10-CM | POA: Diagnosis not present

## 2017-05-24 DIAGNOSIS — R269 Unspecified abnormalities of gait and mobility: Secondary | ICD-10-CM | POA: Diagnosis not present

## 2017-05-24 DIAGNOSIS — Z9889 Other specified postprocedural states: Secondary | ICD-10-CM | POA: Diagnosis not present

## 2017-05-24 NOTE — Telephone Encounter (Signed)
Spoke with pt and informed pt that we do not have any Eliquis samples. We have a "free 30-day trial" card which pt will have someone stop by to pick it up on his behave.

## 2017-05-24 NOTE — Telephone Encounter (Signed)
Pt calling to see if you have any samples of ELIQUIS 2.5.

## 2017-05-24 NOTE — Telephone Encounter (Signed)
New message   Patient calling the office for samples of medication:   1.  What medication and dosage are you requesting samples for? apixaban (ELIQUIS) 2.5 MG TABS tablet  2.  Are you currently out of this medication? No

## 2017-05-25 DIAGNOSIS — I10 Essential (primary) hypertension: Secondary | ICD-10-CM | POA: Diagnosis not present

## 2017-05-25 DIAGNOSIS — R269 Unspecified abnormalities of gait and mobility: Secondary | ICD-10-CM | POA: Diagnosis not present

## 2017-05-25 DIAGNOSIS — I129 Hypertensive chronic kidney disease with stage 1 through stage 4 chronic kidney disease, or unspecified chronic kidney disease: Secondary | ICD-10-CM | POA: Diagnosis not present

## 2017-05-25 DIAGNOSIS — Z9889 Other specified postprocedural states: Secondary | ICD-10-CM | POA: Diagnosis not present

## 2017-05-25 MED ORDER — APIXABAN 2.5 MG PO TABS: 2.5000 mg | ORAL_TABLET | Freq: Two times a day (BID) | ORAL | 0 refills | Status: DC

## 2017-05-25 NOTE — Telephone Encounter (Signed)
Patient aware samples are at the front desk for pick up  

## 2017-05-28 DIAGNOSIS — I440 Atrioventricular block, first degree: Secondary | ICD-10-CM | POA: Diagnosis not present

## 2017-05-28 DIAGNOSIS — Z7982 Long term (current) use of aspirin: Secondary | ICD-10-CM | POA: Diagnosis not present

## 2017-05-28 DIAGNOSIS — Z48812 Encounter for surgical aftercare following surgery on the circulatory system: Secondary | ICD-10-CM | POA: Diagnosis not present

## 2017-05-28 DIAGNOSIS — Z9889 Other specified postprocedural states: Secondary | ICD-10-CM | POA: Diagnosis not present

## 2017-05-28 DIAGNOSIS — Z8679 Personal history of other diseases of the circulatory system: Secondary | ICD-10-CM | POA: Diagnosis not present

## 2017-05-28 DIAGNOSIS — I719 Aortic aneurysm of unspecified site, without rupture: Secondary | ICD-10-CM | POA: Diagnosis not present

## 2017-05-28 DIAGNOSIS — I4581 Long QT syndrome: Secondary | ICD-10-CM | POA: Diagnosis not present

## 2017-05-28 DIAGNOSIS — R918 Other nonspecific abnormal finding of lung field: Secondary | ICD-10-CM | POA: Diagnosis not present

## 2017-05-28 DIAGNOSIS — I517 Cardiomegaly: Secondary | ICD-10-CM | POA: Diagnosis not present

## 2017-05-29 DIAGNOSIS — R269 Unspecified abnormalities of gait and mobility: Secondary | ICD-10-CM | POA: Diagnosis not present

## 2017-05-29 DIAGNOSIS — I129 Hypertensive chronic kidney disease with stage 1 through stage 4 chronic kidney disease, or unspecified chronic kidney disease: Secondary | ICD-10-CM | POA: Diagnosis not present

## 2017-05-29 DIAGNOSIS — I10 Essential (primary) hypertension: Secondary | ICD-10-CM | POA: Diagnosis not present

## 2017-05-29 DIAGNOSIS — Z9889 Other specified postprocedural states: Secondary | ICD-10-CM | POA: Diagnosis not present

## 2017-05-31 DIAGNOSIS — I10 Essential (primary) hypertension: Secondary | ICD-10-CM | POA: Diagnosis not present

## 2017-05-31 DIAGNOSIS — I129 Hypertensive chronic kidney disease with stage 1 through stage 4 chronic kidney disease, or unspecified chronic kidney disease: Secondary | ICD-10-CM | POA: Diagnosis not present

## 2017-05-31 DIAGNOSIS — Z9889 Other specified postprocedural states: Secondary | ICD-10-CM | POA: Diagnosis not present

## 2017-05-31 DIAGNOSIS — R269 Unspecified abnormalities of gait and mobility: Secondary | ICD-10-CM | POA: Diagnosis not present

## 2017-06-01 ENCOUNTER — Telehealth: Payer: Self-pay | Admitting: Cardiology

## 2017-06-01 ENCOUNTER — Telehealth (HOSPITAL_COMMUNITY): Payer: Self-pay

## 2017-06-01 DIAGNOSIS — R269 Unspecified abnormalities of gait and mobility: Secondary | ICD-10-CM | POA: Diagnosis not present

## 2017-06-01 DIAGNOSIS — I129 Hypertensive chronic kidney disease with stage 1 through stage 4 chronic kidney disease, or unspecified chronic kidney disease: Secondary | ICD-10-CM | POA: Diagnosis not present

## 2017-06-01 DIAGNOSIS — Z9889 Other specified postprocedural states: Secondary | ICD-10-CM | POA: Diagnosis not present

## 2017-06-01 DIAGNOSIS — I10 Essential (primary) hypertension: Secondary | ICD-10-CM | POA: Diagnosis not present

## 2017-06-01 NOTE — Telephone Encounter (Signed)
06/01/2017 Received faxed referral from [facility] for upcoming appointment with Dr. Sallyanne Kuster on 06/25/2017.  Records given to [facility].  cbr

## 2017-06-01 NOTE — Telephone Encounter (Signed)
I called and spoke with patient about scheduling for cardiac rehab. Patient is currently receiving PT services until 06/20/17. Patient has an appointment to see cardiologist here in [place] on 06/25/17 and would like for me to contact him after that visit to schedule.

## 2017-06-05 DIAGNOSIS — R269 Unspecified abnormalities of gait and mobility: Secondary | ICD-10-CM | POA: Diagnosis not present

## 2017-06-05 DIAGNOSIS — Z9889 Other specified postprocedural states: Secondary | ICD-10-CM | POA: Diagnosis not present

## 2017-06-05 DIAGNOSIS — I129 Hypertensive chronic kidney disease with stage 1 through stage 4 chronic kidney disease, or unspecified chronic kidney disease: Secondary | ICD-10-CM | POA: Diagnosis not present

## 2017-06-05 DIAGNOSIS — I10 Essential (primary) hypertension: Secondary | ICD-10-CM | POA: Diagnosis not present

## 2017-06-06 DIAGNOSIS — Z9889 Other specified postprocedural states: Secondary | ICD-10-CM | POA: Diagnosis not present

## 2017-06-06 DIAGNOSIS — R269 Unspecified abnormalities of gait and mobility: Secondary | ICD-10-CM | POA: Diagnosis not present

## 2017-06-06 DIAGNOSIS — I129 Hypertensive chronic kidney disease with stage 1 through stage 4 chronic kidney disease, or unspecified chronic kidney disease: Secondary | ICD-10-CM | POA: Diagnosis not present

## 2017-06-06 DIAGNOSIS — I10 Essential (primary) hypertension: Secondary | ICD-10-CM | POA: Diagnosis not present

## 2017-06-12 DIAGNOSIS — I10 Essential (primary) hypertension: Secondary | ICD-10-CM | POA: Diagnosis not present

## 2017-06-12 DIAGNOSIS — Z9889 Other specified postprocedural states: Secondary | ICD-10-CM | POA: Diagnosis not present

## 2017-06-12 DIAGNOSIS — I129 Hypertensive chronic kidney disease with stage 1 through stage 4 chronic kidney disease, or unspecified chronic kidney disease: Secondary | ICD-10-CM | POA: Diagnosis not present

## 2017-06-12 DIAGNOSIS — R269 Unspecified abnormalities of gait and mobility: Secondary | ICD-10-CM | POA: Diagnosis not present

## 2017-06-18 ENCOUNTER — Telehealth: Payer: Self-pay | Admitting: Cardiovascular Disease

## 2017-06-18 NOTE — Telephone Encounter (Signed)
New message   Patient calling the office for samples of medication:   1.  What medication and dosage are you requesting samples for? apixaban (ELIQUIS) 2.5 MG TABS tablet  2.  Are you currently out of this medication? yes

## 2017-06-19 MED ORDER — APIXABAN 2.5 MG PO TABS: 2.5000 mg | ORAL_TABLET | Freq: Two times a day (BID) | ORAL | 0 refills | Status: DC

## 2017-06-19 NOTE — Telephone Encounter (Signed)
Patient aware samples are at the front desk for pick up  

## 2017-06-20 DIAGNOSIS — I129 Hypertensive chronic kidney disease with stage 1 through stage 4 chronic kidney disease, or unspecified chronic kidney disease: Secondary | ICD-10-CM | POA: Diagnosis not present

## 2017-06-20 DIAGNOSIS — R269 Unspecified abnormalities of gait and mobility: Secondary | ICD-10-CM | POA: Diagnosis not present

## 2017-06-20 DIAGNOSIS — I10 Essential (primary) hypertension: Secondary | ICD-10-CM | POA: Diagnosis not present

## 2017-06-20 DIAGNOSIS — Z9889 Other specified postprocedural states: Secondary | ICD-10-CM | POA: Diagnosis not present

## 2017-06-25 ENCOUNTER — Ambulatory Visit (INDEPENDENT_AMBULATORY_CARE_PROVIDER_SITE_OTHER): Payer: Medicare Other | Admitting: Cardiology

## 2017-06-25 ENCOUNTER — Encounter: Payer: Self-pay | Admitting: Cardiology

## 2017-06-25 VITALS — BP 122/94 | HR 82 | Ht 71.0 in | Wt 346.0 lb

## 2017-06-25 DIAGNOSIS — I10 Essential (primary) hypertension: Secondary | ICD-10-CM

## 2017-06-25 DIAGNOSIS — N184 Chronic kidney disease, stage 4 (severe): Secondary | ICD-10-CM

## 2017-06-25 DIAGNOSIS — I7121 Aneurysm of the ascending aorta, without rupture: Secondary | ICD-10-CM

## 2017-06-25 DIAGNOSIS — I714 Abdominal aortic aneurysm, without rupture, unspecified: Secondary | ICD-10-CM

## 2017-06-25 DIAGNOSIS — I712 Thoracic aortic aneurysm, without rupture: Secondary | ICD-10-CM | POA: Diagnosis not present

## 2017-06-25 DIAGNOSIS — Z0389 Encounter for observation for other suspected diseases and conditions ruled out: Secondary | ICD-10-CM | POA: Diagnosis not present

## 2017-06-25 DIAGNOSIS — Z7901 Long term (current) use of anticoagulants: Secondary | ICD-10-CM

## 2017-06-25 DIAGNOSIS — IMO0001 Reserved for inherently not codable concepts without codable children: Secondary | ICD-10-CM | POA: Insufficient documentation

## 2017-06-25 NOTE — Assessment & Plan Note (Signed)
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

## 2017-06-25 NOTE — Assessment & Plan Note (Signed)
2011 and May 2018

## 2017-06-25 NOTE — Progress Notes (Signed)
06/25/2017 [name]   1971/02/15  [id number]  Primary Physician Marletta Lor, MD Primary Cardiologist: Dr Sallyanne Kuster  HPI:  Pleasant 46 y/o morbidly obese AA male with a history of prior aortic aneurysm stent grafting in 2013 and s/p recent ascending aortic grafting at [facility]. Other problems include CRI-4, morbid obesity with BMI > 50 and suspected sleep apnea, and HTN. He is here for the first visit with [place] after his surgery at [facility]. He says this went well, he was home on post op day 6. His renal function remained stable. His coronaries were normal (per his report) at his pre surgery coronary angiogram. Dr Ysidro Evert did mention the degree of valve degeneration and the pt's age suggested possibly a connective tissue disorder and correlation with clinical and genetic findings would be required. Additionally, surveillance for vascular changes associated with dialysis fistulas should be considered.       Current Outpatient Prescriptions  Medication Sig Dispense Refill  . acetaminophen (TYLENOL) 500 MG tablet Take 1,000 mg by mouth every 6 (six) hours as needed for moderate pain or headache. Reported on 12/14/2015    . allopurinol (ZYLOPRIM) 300 MG tablet TAKE 1 TABLET BY MOUTH  DAILY 90 tablet 1  . apixaban (ELIQUIS) 2.5 MG TABS tablet Take 1 tablet (2.5 mg total) by mouth 2 (two) times daily. 28 tablet 0  . aspirin 81 MG chewable tablet Chew 81 mg by mouth daily.    [name] atorvastatin (LIPITOR) 10 MG tablet TAKE 1 TABLET BY MOUTH  DAILY 90 tablet 2  . calcitRIOL (ROCALTROL) 0.25 MCG capsule TAKE 1 CAPSULE BY MOUTH  EVERY MONDAY, WEDNESDAY,  AND FRIDAY. 24 capsule 4  . carvedilol (COREG) 25 MG tablet Take 1 tablet (25 mg total) by mouth 2 (two) times daily. 180 tablet 3  . cetirizine (ZYRTEC) 10 MG tablet Take 10 mg by mouth daily.    . furosemide (LASIX) 40 MG tablet Take 1 tablet (40 mg total) by mouth daily. (Patient taking differently: Take 40 mg by mouth daily as needed. ) 90  tablet 3  . Olmesartan-Amlodipine-HCTZ 40-10-12.5 MG TABS Take 1 tablet by mouth daily. 90 tablet 2  . sennosides-docusate sodium (SENOKOT-S) 8.6-50 MG tablet Take 1 tablet by mouth 2 (two) times daily.     No current facility-administered medications for this visit.     Allergies  Allergen Reactions  . Roxicodone [Oxycodone Hcl] Hives and Itching    Hives and itching all over    Past Medical History:  Diagnosis Date  . Abdominal aortic aneurysm ([place])   . Anemia   . Anxiety   . Aortic regurgitation 12/10/08   Echo: mild to mod. AOV regurg,mild AO root dilatation, LA mildly dilated, EF 65%, LV wall thickness markedly increased, small PE  . Chronic kidney disease   . Chronic renal disease    advanced  . DVT (deep venous thrombosis) ([place])   . H/O aortic dissection 2009   infarenal abdominal aortic dissection  . History of pulmonary embolus (PE) 2010  . Hypertension    dr [name]  . Left ventricular hypertrophy   . Obesity   . Stroke [facility]) 2009   pt says no stroke    Social History   Social History  . Marital status: Married    Spouse name: N/A  . Number of children: N/A  . Years of education: N/A   Occupational History  . Not on file.   Social History Main Topics  . Smoking status:  Never Smoker  . Smokeless tobacco: Never Used  . Alcohol use No  . Drug use: No  . Sexual activity: Not Currently    Partners: Female   Other Topics Concern  . Not on file   Social History Narrative  . No narrative on file     Family History  Problem Relation Age of Onset  . Hypertension Mother   . Hypertension Father      Review of Systems: General: negative for chills, fever, night sweats or weight changes.  Cardiovascular: negative for chest pain, dyspnea on exertion, edema, orthopnea, palpitations, paroxysmal nocturnal dyspnea or shortness of breath Dermatological: negative for rash Respiratory: negative for cough or wheezing Urologic: negative for  hematuria Abdominal: negative for nausea, vomiting, diarrhea, bright red blood per rectum, melena, or hematemesis Neurologic: negative for visual changes, syncope, or dizziness All other systems reviewed and are otherwise negative except as noted above.    Blood pressure (!) 122/94, pulse 82, height 5\' 11"  (1.803 m), weight (!) 346 lb (156.9 kg).  General appearance: alert, cooperative, no distress and morbidly obese Lungs: clear to auscultation bilaterally and mid line scar intact Heart: regular rate and rhythm and short systolic murmur LSB Extremities: no edema Skin: Skin color, texture, turgor normal. No rashes or lesions Neurologic: Grossly normal  EKG NSR with TWI 1,L,V5-V6- attributed to LVH- similar to past EKGs  ASSESSMENT AND PLAN:   Ascending aortic aneurysm ([place]) S/P Yacoub valve sparing root replacement with coronary reconstruction along with reapir of aortic valve with a #71mm HAART 300 aortic ring annuloplasty on 05/07/2017  By Dr Ysidro Evert at [facility] hypertension, malignant Controlled- no medications yet this am  Normal coronary arteries 2011 and May 2018  h/o AAA (abdominal aortic aneurysm) S/P stent graft repair secondary to a chronic dissection on 09/25/2012 with evidence of slow continued enlargement of the abdominal aortic native aneurysm sac despite bifurcated aortic stent graft placement suggesting endoleak. Followed by Dr Trula Slade.  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 He does not think he can do C-pap. He will "sleep on it" and let us know. F/U Dr Sallyanne Kuster in 6 months.  Kerin Ransom PA-C 06/25/2017 9:59 AM

## 2017-06-25 NOTE — Patient Instructions (Signed)
Medication Instructions: No changes   Follow-Up: Your physician wants you to follow-up in: 6 months with Dr. Sallyanne Kuster. You will receive a reminder letter in the mail two months in advance. If you don't receive a letter, please call our office to schedule the follow-up appointment.    If you need a refill on your cardiac medications before your next appointment, please call your pharmacy.

## 2017-06-25 NOTE — Assessment & Plan Note (Signed)
He has an AVF in place, followed by Dr [name]

## 2017-06-25 NOTE — Assessment & Plan Note (Signed)
Suspected sleep apnea by history, he has been ambivalent about getting a sleep study

## 2017-06-25 NOTE — Assessment & Plan Note (Signed)
S/P Yacoub valve sparing root replacement with coronary reconstruction along with reapir of aortic valve with a #51mm HAART 300 aortic ring annuloplasty on 05/07/2017  By Dr Ysidro Evert at [facility]

## 2017-06-25 NOTE — Assessment & Plan Note (Signed)
Controlled- no medications yet this am

## 2017-06-25 NOTE — Assessment & Plan Note (Signed)
S/P stent graft repair secondary to a chronic dissection on 09/25/2012 with evidence of slow continued enlargement of the abdominal aortic native aneurysm sac despite bifurcated aortic stent graft placement suggesting endoleak. Followed by Dr Trula Slade.

## 2017-07-04 ENCOUNTER — Telehealth (HOSPITAL_COMMUNITY): Payer: Self-pay

## 2017-07-04 NOTE — Telephone Encounter (Signed)
*  Updated insurance * [employer] Medicare - $20.00 co-payment, no deductible, out of pocket $6700/$1501.87 has been met, no co-insurance, no pre-authorization and no limit on visit. Passport/reference [phone].

## 2017-07-05 ENCOUNTER — Encounter (HOSPITAL_COMMUNITY)
Admission: RE | Admit: 2017-07-05 | Discharge: 2017-07-05 | Disposition: A | Discharge status: Home / Self Care | Payer: Medicare Other | Source: Ambulatory Visit | Attending: Cardiovascular Disease | Admitting: Cardiovascular Disease

## 2017-07-05 ENCOUNTER — Encounter (HOSPITAL_COMMUNITY): Payer: Self-pay

## 2017-07-05 VITALS — BP 124/64 | HR 99 | Ht 72.0 in | Wt 364.2 lb

## 2017-07-05 DIAGNOSIS — Z9889 Other specified postprocedural states: Secondary | ICD-10-CM

## 2017-07-05 DIAGNOSIS — N189 Chronic kidney disease, unspecified: Secondary | ICD-10-CM | POA: Insufficient documentation

## 2017-07-05 DIAGNOSIS — I719 Aortic aneurysm of unspecified site, without rupture: Secondary | ICD-10-CM | POA: Diagnosis not present

## 2017-07-05 DIAGNOSIS — I129 Hypertensive chronic kidney disease with stage 1 through stage 4 chronic kidney disease, or unspecified chronic kidney disease: Secondary | ICD-10-CM | POA: Insufficient documentation

## 2017-07-05 DIAGNOSIS — Z8673 Personal history of transient ischemic attack (TIA), and cerebral infarction without residual deficits: Secondary | ICD-10-CM | POA: Diagnosis not present

## 2017-07-05 DIAGNOSIS — Z952 Presence of prosthetic heart valve: Secondary | ICD-10-CM | POA: Insufficient documentation

## 2017-07-05 DIAGNOSIS — Z79899 Other long term (current) drug therapy: Secondary | ICD-10-CM | POA: Insufficient documentation

## 2017-07-05 DIAGNOSIS — Z8679 Personal history of other diseases of the circulatory system: Secondary | ICD-10-CM | POA: Insufficient documentation

## 2017-07-05 NOTE — Progress Notes (Signed)
[name] 46 y.o. male       Nutrition Screen & Note  1. S/P aortic valve repair    Past Medical History:  Diagnosis Date  . Abdominal aortic aneurysm ([place])   . Anemia   . Anxiety   . Aortic regurgitation 12/10/08   Echo: mild to mod. AOV regurg,mild AO root dilatation, LA mildly dilated, EF 65%, LV wall thickness markedly increased, small PE  . Chronic kidney disease   . Chronic renal disease    advanced  . DVT (deep venous thrombosis) ([place])   . H/O aortic dissection 2009   infarenal abdominal aortic dissection  . History of pulmonary embolus (PE) 2010  . Hypertension    dr [name]  . Left ventricular hypertrophy   . Obesity   . Stroke [facility]) 2009   pt says no stroke   Meds reviewed.  HT: Ht Readings from Last 1 Encounters:  07/05/17 6' (1.829 m)    WT: Wt Readings from Last 3 Encounters:  07/05/17 (!) 364 lb 3.2 oz (165.2 kg)  06/25/17 (!) 346 lb (156.9 kg)  04/26/17 (!) 377 lb (171 kg)     BMI 49.5   Current tobacco use? No  Labs:  No results found for: HGBA1C 05/06/17 HGBA1C 5.7 CBG (last 3)  No results for input(s): GLUCAP in the last 72 hours.  Nutrition Note Spoke with pt and pt's wife. Nutrition plan and goals reviewed with pt. Pt is following Step 1 of the Therapeutic Lifestyle Changes diet. Pt wants to lose wt. Pt reportedly lost 20 lb prior to surgery by swimming. Pt stopped swimming due to shunt placed in his left arm and gained 20 lb back. Pt is not actively trying to lose wt at this time. Pt has focused on making healthier food choices. Pt having difficulty combining his kidney and heart diets. Pt states he has been instructed to follow a low potassium, low phosphorus diet. Combining a heart healthy and kidney diet discussed. Pt had questions re: purine restricted diet due to gout, which were answered. Pt's wife is supportive of dietary changes needed for pt's health. Barriers to pt dietary changes include pt gets bored easily and pt wife is  starting chemo in September for her MS. Pt expressed understanding of the information reviewed. Pt aware of nutrition education classes offered.  Nutrition Diagnosis ? Food-and nutrition-related knowledge deficit related to lack of exposure to information as related to diagnosis of: ? CHF ? Obesity related to excessive energy intake as evidenced by a BMI of 49.5  Nutrition Intervention ? Pt's individual nutrition plan reviewed with pt. ? Pt given handouts for: ? Purine Restricted Nutrition Therapy ? Potassium Content of Food ? Phosphorus Content of Food ? Continue client-centered nutrition education by RD, as part of interdisciplinary care.  Nutrition Goal(s):  ? Pt to increase self efficacy re: combining Heart Healthy and Kidney diets. ? Pt to identify food quantities necessary to achieve weight loss of 6-24 lb (2.7-10.9 kg) at graduation from cardiac rehab.  Plan:  Pt to attend nutrition classes ? Nutrition I ? Nutrition II ? Portion Distortion  Will provide client-centered nutrition education as part of interdisciplinary care.   Monitor and evaluate progress toward nutrition goal with team.  Derek Mound, M.Ed, RD, LDN, CDE 07/05/2017 10:38 AM

## 2017-07-05 NOTE — Progress Notes (Signed)
Cardiac Rehab Medication Review by a Pharmacist  Does the patient feel that his/her medications are working for him/her?  yes  Has the patient been experiencing any side effects to the medications prescribed?  yes  Does the patient measure his/her own blood pressure or blood glucose at home?  yes   Does the patient have any problems obtaining medications due to transportation or finances?   yes  Understanding of regimen: good Understanding of indications: good Potential of compliance: fair   Pharmacist comments: Patient has been experiencing weakness/dizziness/loss of hearing at variable times of the day. Patient states that it has been occurring for as long as he can remember. Nothing to his knowledge exacerbates it or improves his symptoms. He states that when this occurs he is knowledgeable in taking his blood pressure at home. Patient states that he experiences difficulty obtaining Eliquis and has been receiving it from doctors offices as samples. He states he missed doses in between sample acquisition but hasn't missed a dose in the past few months. He has tried multiple avenues in obtaining Eliquis via the manufacturer, samples, and using a case Metallurgist and has had no success. We recommended warfarin as it is more affordable, but he states that warfarin is not an option for him.    Bridgett Larsson, PharmD, MS 07/05/2017 9:02 AM

## 2017-07-05 NOTE — Progress Notes (Signed)
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  allopurinol (ZYLOPRIM) 300 MG tablet, TAKE 1 TABLET BY MOUTH  DAILY (Patient taking differently: TAKE 1/2 TABLET BY MOUTH  DAILY), Disp: 90 tablet, Rfl: 1 .  apixaban (ELIQUIS) 2.5 MG TABS tablet, Take 1 tablet (2.5 mg total) by mouth 2 (two) times daily., Disp: 28 tablet, Rfl: 0 .  aspirin 81 MG chewable tablet, Chew 81 mg by mouth daily., Disp: , Rfl:  .  atorvastatin (LIPITOR) 10 MG tablet, TAKE 1 TABLET BY MOUTH  DAILY, Disp: 90 tablet, Rfl: 2 .  calcitRIOL (ROCALTROL) 0.25 MCG capsule, TAKE 1 CAPSULE BY MOUTH  EVERY MONDAY, WEDNESDAY,  AND FRIDAY., Disp: 24 capsule, Rfl: 4 .  carvedilol (COREG) 25 MG tablet, Take 1 tablet (25 mg total) by mouth 2 (two) times daily., Disp: 180 tablet, Rfl: 3 .  cetirizine (ZYRTEC) 10 MG tablet, Take 10 mg by mouth daily., Disp: , Rfl:  .  furosemide (LASIX) 40 MG tablet, Take 1 tablet (40 mg total) by mouth daily., Disp: 90 tablet, Rfl: 3 .  Olmesartan-Amlodipine-HCTZ 40-10-12.5 MG TABS, Take 1 tablet by mouth daily., Disp: 90 tablet, Rfl: 2 .  sennosides-docusate sodium (SENOKOT-S) 8.6-50 MG tablet, Take 1 tablet by mouth 2 (two) times daily., Disp: , Rfl:  .  acetaminophen (TYLENOL) 500 MG tablet, Take 1,000 mg by mouth every 6 (six) hours as needed for  moderate pain or headache. Reported on 12/14/2015, Disp: , Rfl:   Past Medical History: Past Medical History:  Diagnosis Date  . Abdominal aortic aneurysm ([place])   . Anemia   . Anxiety   . Aortic regurgitation 12/10/08   Echo: mild to mod. AOV regurg,mild AO root dilatation, LA mildly dilated, EF 65%, LV wall thickness markedly increased, small PE  . Chronic kidney disease   . Chronic renal disease    advanced  . DVT (deep venous thrombosis) ([place])   . H/O aortic dissection 2009   infarenal abdominal aortic dissection  . History of pulmonary embolus (PE) 2010  . Hypertension    dr [name]  . Left ventricular hypertrophy   . Obesity   . Stroke [facility]) 2009   pt says no stroke    Tobacco Use: History  Smoking Status  . Never Smoker  Smokeless Tobacco  . Never Used    Labs: Recent Review Flowsheet Data    Labs for ITP Cardiac and Pulmonary Rehab Latest Ref Rng & Units 05/07/2009 02/06/2012 02/14/2012 02/29/2012 09/16/2012   Cholestrol 0 - 200 mg/dL - - - 140 -   LDLCALC 0 - 99 mg/dL - - - 84 -   HDL >39 mg/dL - - - 42 -   Trlycerides <150 mg/dL - - - 72 -   PHART 7.350 - 7.450 7.384 - - - 7.433   PCO2ART 35.0 - 45.0 mmHg 36.7 - - -  35.5   HCO3 20.0 - 24.0 mEq/L 21.8 - - - 23.3   TCO2 0 - 100 mmol/L 23 22 21  - 24.4   ACIDBASEDEF 0.0 - 2.0 mmol/L 3.0(H) - - - 0.4   O2SAT % 94.0 - - - 96.8      Capillary Blood Glucose: Lab Results  Component Value Date   GLUCAP 138 (H) 09/25/2012     Exercise Target Goals: Date: 07/05/17  Exercise Program Goal: Individual exercise prescription set with THRR, safety & activity barriers. Participant demonstrates ability to understand and report RPE using BORG scale, to self-measure pulse accurately, and to acknowledge the importance of the exercise prescription.  Exercise Prescription Goal: Starting with aerobic activity 30 plus minutes a day, 3 days per week for initial exercise prescription. Provide home exercise prescription and  guidelines that participant acknowledges understanding prior to discharge.  Activity Barriers & Risk Stratification:     Activity Barriers & Cardiac Risk Stratification - 07/05/17 1432      Activity Barriers & Cardiac Risk Stratification   Activity Barriers Other (comment);Deconditioning   Cardiac Risk Stratification High      6 Minute Walk:     6 Minute Walk    Row Name 07/05/17 1034         6 Minute Walk   Phase Initial     Distance 1345 feet     Walk Time 6 minutes     # of Rest Breaks 0     MPH 2.55     METS 3.21     RPE 13     VO2 Peak 11.24     Symptoms No     Resting HR 99 bpm     Resting BP 124/64     Max Ex. HR 128 bpm     Max Ex. BP 146/80        Oxygen Initial Assessment:   Oxygen Re-Evaluation:   Oxygen Discharge (Final Oxygen Re-Evaluation):   Initial Exercise Prescription:     Initial Exercise Prescription - 07/05/17 1200      Date of Initial Exercise RX and Referring Provider   Date 07/05/17   Referring Provider Croitoru, Mihai MD     Treadmill   MPH 1.7   Grade 1   Minutes 10   METs 2     T5 Nustep   Level 3   SPM 80   Minutes 20   METs 2     Prescription Details   Frequency (times per week) 3   Duration Progress to 45 minutes of aerobic exercise without signs/symptoms of physical distress     Intensity   THRR 40-80% of Max Heartrate 69-138   Ratings of Perceived Exertion 11-13   Perceived Dyspnea 0-4     Progression   Progression Continue to progress workloads to maintain intensity without signs/symptoms of physical distress.     Resistance Training   Training Prescription Yes   Weight 3   Reps 10-15      Perform Capillary Blood Glucose checks as needed.  Exercise Prescription Changes:   Exercise Comments:   Exercise Goals and Review:      Exercise Goals    Row Name 07/05/17 0959             Exercise Goals   Increase Physical Activity Yes       Intervention Provide advice, education, support  and counseling about physical activity/exercise needs.;Develop an individualized exercise prescription for aerobic and resistive training based on initial evaluation  findings, risk stratification, comorbidities and participant's personal goals.       Expected Outcomes Achievement of increased cardiorespiratory fitness and enhanced flexibility, muscular endurance and strength shown through measurements of functional capacity and personal statement of participant.       Increase Strength and Stamina Yes       Intervention Provide advice, education, support and counseling about physical activity/exercise needs.;Develop an individualized exercise prescription for aerobic and resistive training based on initial evaluation findings, risk stratification, comorbidities and participant's personal goals.       Expected Outcomes Achievement of increased cardiorespiratory fitness and enhanced flexibility, muscular endurance and strength shown through measurements of functional capacity and personal statement of participant.          Exercise Goals Re-Evaluation :    Discharge Exercise Prescription (Final Exercise Prescription Changes):   Nutrition:  Target Goals: Understanding of nutrition guidelines, daily intake of sodium 1500mg , cholesterol 200mg , calories 30% from fat and 7% or less from saturated fats, daily to have 5 or more servings of fruits and vegetables.  Biometrics:     Pre Biometrics - 07/05/17 1427      Pre Biometrics   Height 6' (1.829 m)   Weight (!)  364 lb 3.2 oz (165.2 kg)   Waist Circumference 56.5 inches   Hip Circumference 69 inches   Waist to Hip Ratio 0.82 %   BMI (Calculated) 49.5   Triceps Skinfold 44 mm   % Body Fat 46.8 %   Grip Strength 38.5 kg   Flexibility 15.5 in   Single Leg Stand 12.6 seconds       Nutrition Therapy Plan and Nutrition Goals:     Nutrition Therapy & Goals - 07/05/17 1055      Nutrition Therapy   Diet Renal, Heart Healthy    Drug/Food Interactions Purine/Gout     Personal Nutrition Goals   Nutrition Goal Pt to increase self efficacy re: combining Heart Healthy and Kidney diets.   Personal Goal #2 Pt to identify food quantities necessary to achieve weight loss of 6-24 lb (2.7-10.9 kg) at graduation from cardiac rehab.     Intervention Plan   Intervention Prescribe, educate and counsel regarding individualized specific dietary modifications aiming towards targeted core components such as weight, hypertension, lipid management, diabetes, heart failure and other comorbidities.;Nutrition handout(s) given to patient.  Potassium Content of Foods, Phosphorus Content of Foods, and Purine Restricted Nutrition Therapy   Expected Outcomes Short Term Goal: Understand basic principles of dietary content, such as calories, fat, sodium, cholesterol and nutrients.;Long Term Goal: Adherence to prescribed nutrition plan.      Nutrition Discharge: Nutrition Scores:     Nutrition Assessments - 07/05/17 1106      MEDFICTS Scores   Pre Score 56      Nutrition Goals Re-Evaluation:   Nutrition Goals Re-Evaluation:   Nutrition Goals Discharge (Final Nutrition Goals Re-Evaluation):   Psychosocial: Target Goals: Acknowledge presence or absence of significant depression and/or stress, maximize coping skills, provide positive support system. Participant is able to verbalize types and ability to use techniques and skills needed for reducing stress and depression.  Initial Review & Psychosocial Screening:     Initial Psych Review & Screening - 07/05/17 1422      Initial Review   Current issues with None Identified     Family Dynamics   Good Support System? Yes     Barriers   Psychosocial barriers to participate in program There are no identifiable barriers or psychosocial needs.;The  patient should benefit from training in stress management and relaxation.     Screening Interventions   Interventions Encouraged to  exercise      Quality of Life Scores:     Quality of Life - 07/05/17 1205      Quality of Life Scores   Health/Function Pre 19.11 %   Socioeconomic Pre 18 %   Psych/Spiritual Pre 24 %   Family Pre 12 %   GLOBAL Pre 19.05 %      PHQ-9: Recent Review Flowsheet Data    There is no flowsheet data to display.     Interpretation of Total Score  Total Score Depression Severity:  1-4 = Minimal depression, 5-9 = Mild depression, 10-14 = Moderate depression, 15-19 = Moderately severe depression, 20-27 = Severe depression   Psychosocial Evaluation and Intervention:   Psychosocial Re-Evaluation:   Psychosocial Discharge (Final Psychosocial Re-Evaluation):   Vocational Rehabilitation: Provide vocational rehab assistance to qualifying candidates.   Vocational Rehab Evaluation & Intervention:     Vocational Rehab - 07/05/17 1409      Initial Vocational Rehab Evaluation & Intervention   Assessment shows need for Vocational Rehabilitation Yes      Education: Education Goals: Education classes will be provided on a weekly basis, covering required topics. Participant will state understanding/return demonstration of topics presented.  Learning Barriers/Preferences:     Learning Barriers/Preferences - 07/05/17 1409      Learning Barriers/Preferences   Learning Barriers Sight  wears contacts   Learning Preferences Verbal Instruction;Written Material;Audio      Education Topics: Count Your Pulse:  -Group instruction provided by verbal instruction, demonstration, patient participation and written materials to support subject.  Instructors address importance of being able to find your pulse and how to count your pulse when at home without a heart monitor.  Patients get hands on experience counting their pulse with staff help and individually.   Heart Attack, Angina, and Risk Factor Modification:  -Group instruction provided by verbal instruction, video, and written  materials to support subject.  Instructors address signs and symptoms of angina and heart attacks.    Also discuss risk factors for heart disease and how to make changes to improve heart health risk factors.   Functional Fitness:  -Group instruction provided by verbal instruction, demonstration, patient participation, and written materials to support subject.  Instructors address safety measures for doing things around the house.  Discuss how to get up and down off the floor, how to pick things up properly, how to safely get out of a chair without assistance, and balance training.   Meditation and Mindfulness:  -Group instruction provided by verbal instruction, patient participation, and written materials to support subject.  Instructor addresses importance of mindfulness and meditation practice to help reduce stress and improve awareness.  Instructor also leads participants through a meditation exercise.    Stretching for Flexibility and Mobility:  -Group instruction provided by verbal instruction, patient participation, and written materials to support subject.  Instructors lead participants through series of stretches that are designed to increase flexibility thus improving mobility.  These stretches are additional exercise for major muscle groups that are typically performed during regular warm up and cool down.   Hands Only CPR:  -Group verbal, video, and participation provides a basic overview of AHA guidelines for community CPR. Role-play of emergencies allow participants the opportunity to practice calling for help and chest compression technique with discussion of AED use.   Hypertension: -Group verbal and written instruction that  provides a basic overview of hypertension including the most recent diagnostic guidelines, risk factor reduction with self-care instructions and medication management.    Nutrition I class: Heart Healthy Eating:  -Group instruction provided by PowerPoint  slides, verbal discussion, and written materials to support subject matter. The instructor gives an explanation and review of the Therapeutic Lifestyle Changes diet recommendations, which includes a discussion on lipid goals, dietary fat, sodium, fiber, plant stanol/sterol esters, sugar, and the components of a well-balanced, healthy diet.   Nutrition II class: Lifestyle Skills:  -Group instruction provided by PowerPoint slides, verbal discussion, and written materials to support subject matter. The instructor gives an explanation and review of label reading, grocery shopping for heart health, heart healthy recipe modifications, and ways to make healthier choices when eating out.   Diabetes Question & Answer:  -Group instruction provided by PowerPoint slides, verbal discussion, and written materials to support subject matter. The instructor gives an explanation and review of diabetes co-morbidities, pre- and post-prandial blood glucose goals, pre-exercise blood glucose goals, signs, symptoms, and treatment of hypoglycemia and hyperglycemia, and foot care basics.   Diabetes Blitz:  -Group instruction provided by PowerPoint slides, verbal discussion, and written materials to support subject matter. The instructor gives an explanation and review of the physiology behind type 1 and type 2 diabetes, diabetes medications and rational behind using different medications, pre- and post-prandial blood glucose recommendations and Hemoglobin A1c goals, diabetes diet, and exercise including blood glucose guidelines for exercising safely.    Portion Distortion:  -Group instruction provided by PowerPoint slides, verbal discussion, written materials, and food models to support subject matter. The instructor gives an explanation of serving size versus portion size, changes in portions sizes over the last 20 years, and what consists of a serving from each food group.   Stress Management:  -Group instruction  provided by verbal instruction, video, and written materials to support subject matter.  Instructors review role of stress in heart disease and how to cope with stress positively.     Exercising on Your Own:  -Group instruction provided by verbal instruction, power point, and written materials to support subject.  Instructors discuss benefits of exercise, components of exercise, frequency and intensity of exercise, and end points for exercise.  Also discuss use of nitroglycerin and activating EMS.  Review options of places to exercise outside of rehab.  Review guidelines for sex with heart disease.   Cardiac Drugs I:  -Group instruction provided by verbal instruction and written materials to support subject.  Instructor reviews cardiac drug classes: antiplatelets, anticoagulants, beta blockers, and statins.  Instructor discusses reasons, side effects, and lifestyle considerations for each drug class.   Cardiac Drugs II:  -Group instruction provided by verbal instruction and written materials to support subject.  Instructor reviews cardiac drug classes: angiotensin converting enzyme inhibitors (ACE-I), angiotensin II receptor blockers (ARBs), nitrates, and calcium channel blockers.  Instructor discusses reasons, side effects, and lifestyle considerations for each drug class.   Anatomy and Physiology of the Circulatory System:  Group verbal and written instruction and models provide basic cardiac anatomy and physiology, with the coronary electrical and arterial systems. Review of: AMI, Angina, Valve disease, Heart Failure, Peripheral Artery Disease, Cardiac Arrhythmia, Pacemakers, and the ICD.   Other Education:  -Group or individual verbal, written, or video instructions that support the educational goals of the cardiac rehab program.   Knowledge Questionnaire Score:     Knowledge Questionnaire Score - 07/05/17 1006      Knowledge Questionnaire  Score   Pre Score 22/24      Core  Components/Risk Factors/Patient Goals at Admission:     Personal Goals and Risk Factors at Admission - 07/05/17 1408      Core Components/Risk Factors/Patient Goals on Admission    Weight Management Yes;Obesity;Weight Maintenance;Weight Loss   Intervention Weight Management: Develop a combined nutrition and exercise program designed to reach desired caloric intake, while maintaining appropriate intake of nutrient and fiber, sodium and fats, and appropriate energy expenditure required for the weight goal.;Weight Management: Provide education and appropriate resources to help participant work on and attain dietary goals.;Weight Management/Obesity: Establish reasonable short term and long term weight goals.;Obesity: Provide education and appropriate resources to help participant work on and attain dietary goals.   Expected Outcomes Short Term: Continue to assess and modify interventions until short term weight is achieved;Long Term: Adherence to nutrition and physical activity/exercise program aimed toward attainment of established weight goal;Weight Maintenance: Understanding of the daily nutrition guidelines, which includes 25-35% calories from fat, 7% or less cal from saturated fats, less than 200mg  cholesterol, less than 1.5gm of sodium, & 5 or more servings of fruits and vegetables daily;Weight Loss: Understanding of general recommendations for a balanced deficit meal plan, which promotes 1-2 lb weight loss per week and includes a negative energy balance of [id number] kcal/d;Understanding recommendations for meals to include 15-35% energy as protein, 25-35% energy from fat, 35-60% energy from carbohydrates, less than 200mg  of dietary cholesterol, 20-35 gm of total fiber daily;Understanding of distribution of calorie intake throughout the day with the consumption of 4-5 meals/snacks   Hypertension Yes   Intervention Provide education on lifestyle modifcations including regular physical activity/exercise,  weight management, moderate sodium restriction and increased consumption of fresh fruit, vegetables, and low fat dairy, alcohol moderation, and smoking cessation.;Monitor prescription use compliance.   Expected Outcomes Short Term: Continued assessment and intervention until BP is < 140/67mm HG in hypertensive participants. < 130/41mm HG in hypertensive participants with diabetes, heart failure or chronic kidney disease.;Long Term: Maintenance of blood pressure at goal levels.   Stress Yes   Intervention Offer individual and/or small group education and counseling on adjustment to heart disease, stress management and health-related lifestyle change. Teach and support self-help strategies.;Refer participants experiencing significant psychosocial distress to appropriate mental health specialists for further evaluation and treatment. When possible, include family members and significant others in education/counseling sessions.   Expected Outcomes Short Term: Participant demonstrates changes in health-related behavior, relaxation and other stress management skills, ability to obtain effective social support, and compliance with psychotropic medications if prescribed.;Long Term: Emotional wellbeing is indicated by absence of clinically significant psychosocial distress or social isolation.      Core Components/Risk Factors/Patient Goals Review:    Core Components/Risk Factors/Patient Goals at Discharge (Final Review):    ITP Comments:     ITP Comments    Row Name 07/05/17 0925           ITP Comments Dr. Fransico Him, Medical Director          Comments: [name] attended orientation from 0800 to 1000 to review rules and guidelines for program. Completed 6 minute walk test, Intitial ITP, and exercise prescription.  VSS. Telemetry-Sinus Rhythm.  Asymptomatic. [name] had valve sparing aortic root replacement with coronary reconstruction along with repair of an aortic valve with a aortic ring  annuloplasty at [facility] on 05/07/2017 by Dr Mali Hughes.Barnet Pall, RN,BSN 07/05/2017 2:48 PM

## 2017-07-07 ENCOUNTER — Telehealth (HOSPITAL_COMMUNITY): Payer: Self-pay | Admitting: *Deleted

## 2017-07-07 NOTE — Telephone Encounter (Signed)
-----   Message from [name], MD sent at 07/07/2017  7:36 PM EDT ----- Regarding: RE: Ok to participate in Cardiac Rehab? Restrictions of activity? No restrictions ----- Message ----- From: Rowe Pavy, RN Sent: 07/05/2017  11:30 AM To: [name], MD Subject: Ok to participate in Cardiac Rehab? Restrict#  Dr. Trula Slade,  The above patient is eligible to participate in cardiac rehab s/p AVR and aneurysm repair at [facility] 05/07/17. Pt has an upcoming appt with you  on 7/30.  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  Are there any restrictions or precautions we should observe for this patient?  Arm restrictions?  Thanks so much for the advisement We are looking forward to working with this patient in rehab. Cherre Huger, BSN Cardiac and Training and development officer

## 2017-07-11 ENCOUNTER — Encounter (HOSPITAL_COMMUNITY): Payer: Medicare Other

## 2017-07-11 ENCOUNTER — Encounter (HOSPITAL_COMMUNITY)
Admission: RE | Admit: 2017-07-11 | Discharge: 2017-07-11 | Disposition: A | Discharge status: Home / Self Care | Payer: Medicare Other | Source: Ambulatory Visit | Attending: Cardiovascular Disease | Admitting: Cardiovascular Disease

## 2017-07-11 DIAGNOSIS — I719 Aortic aneurysm of unspecified site, without rupture: Secondary | ICD-10-CM | POA: Diagnosis not present

## 2017-07-11 DIAGNOSIS — Z9889 Other specified postprocedural states: Secondary | ICD-10-CM | POA: Diagnosis not present

## 2017-07-11 DIAGNOSIS — Z952 Presence of prosthetic heart valve: Secondary | ICD-10-CM | POA: Diagnosis not present

## 2017-07-11 DIAGNOSIS — I129 Hypertensive chronic kidney disease with stage 1 through stage 4 chronic kidney disease, or unspecified chronic kidney disease: Secondary | ICD-10-CM | POA: Diagnosis not present

## 2017-07-11 DIAGNOSIS — N189 Chronic kidney disease, unspecified: Secondary | ICD-10-CM | POA: Diagnosis not present

## 2017-07-11 DIAGNOSIS — Z8673 Personal history of transient ischemic attack (TIA), and cerebral infarction without residual deficits: Secondary | ICD-10-CM | POA: Diagnosis not present

## 2017-07-11 DIAGNOSIS — Z79899 Other long term (current) drug therapy: Secondary | ICD-10-CM | POA: Diagnosis not present

## 2017-07-11 DIAGNOSIS — Z8679 Personal history of other diseases of the circulatory system: Secondary | ICD-10-CM | POA: Diagnosis not present

## 2017-07-11 NOTE — Progress Notes (Signed)
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  acetaminophen (TYLENOL) 500 MG tablet, Take 1,000 mg by mouth every 6 (six) hours as needed for moderate pain or headache. Reported on 12/14/2015, Disp: , Rfl:  .  allopurinol (ZYLOPRIM) 300 MG tablet, TAKE 1 TABLET BY MOUTH  DAILY (Patient taking differently: TAKE 1/2 TABLET BY MOUTH  DAILY), Disp: 90 tablet, Rfl: 1 .  apixaban (ELIQUIS) 2.5 MG TABS tablet, Take 1 tablet (2.5 mg total) by mouth 2 (two) times daily., Disp: 28 tablet, Rfl: 0 .  aspirin 81 MG chewable tablet, Chew 81 mg by mouth daily., Disp: , Rfl:  .  atorvastatin (LIPITOR) 10 MG tablet, TAKE 1 TABLET BY MOUTH  DAILY, Disp: 90 tablet, Rfl: 2 .  calcitRIOL (ROCALTROL) 0.25 MCG capsule, TAKE 1 CAPSULE BY MOUTH  EVERY MONDAY, WEDNESDAY,  AND FRIDAY., Disp: 24 capsule, Rfl: 4 .  carvedilol (COREG) 25 MG tablet, Take 1 tablet (25 mg total) by mouth 2 (two) times daily., Disp: 180 tablet, Rfl: 3 .  cetirizine (ZYRTEC) 10 MG tablet, Take 10 mg by mouth daily., Disp: , Rfl:  .  furosemide (LASIX) 40 MG tablet, Take 1 tablet (40 mg total) by mouth daily., Disp: 90 tablet, Rfl: 3 .  Olmesartan-Amlodipine-HCTZ 40-10-12.5 MG TABS, Take 1 tablet by mouth daily., Disp: 90 tablet, Rfl: 2 .  sennosides-docusate sodium (SENOKOT-S) 8.6-50 MG  tablet, Take 1 tablet by mouth 2 (two) times daily., Disp: , Rfl:   Past Medical History: Past Medical History:  Diagnosis Date  . Abdominal aortic aneurysm ([place])   . Anemia   . Anxiety   . Aortic regurgitation 12/10/08   Echo: mild to mod. AOV regurg,mild AO root dilatation, LA mildly dilated, EF 65%, LV wall thickness markedly increased, small PE  . Chronic kidney disease   . Chronic renal disease    advanced  . DVT (deep venous thrombosis) ([place])   . H/O aortic dissection 2009   infarenal abdominal aortic dissection  . History of pulmonary embolus (PE) 2010  . Hypertension    dr [name]  . Left ventricular hypertrophy   . Obesity   . Stroke [facility]) 2009   pt says no stroke    Tobacco Use: History  Smoking Status  . Never Smoker  Smokeless Tobacco  . Never Used    Labs: Recent Review Flowsheet Data    Labs for ITP Cardiac and Pulmonary Rehab Latest Ref Rng & Units 05/07/2009 02/06/2012 02/14/2012 02/29/2012 09/16/2012   Cholestrol 0 - 200 mg/dL - - - 140 -   LDLCALC 0 - 99 mg/dL - - - 84 -   HDL >39 mg/dL - - - 42 -   Trlycerides <150 mg/dL - - - 72 -   PHART 7.350 - 7.450 7.384 - - - 7.433   PCO2ART 35.0 - 45.0 mmHg 36.7 - - -  35.5   HCO3 20.0 - 24.0 mEq/L 21.8 - - - 23.3   TCO2 0 - 100 mmol/L 23 22 21  - 24.4   ACIDBASEDEF 0.0 - 2.0 mmol/L 3.0(H) - - - 0.4   O2SAT % 94.0 - - - 96.8      Capillary Blood Glucose: Lab Results  Component Value Date   GLUCAP 138 (H) 09/25/2012     Exercise Target Goals:    Exercise Program Goal: Individual exercise prescription set with THRR, safety & activity barriers. Participant demonstrates ability to understand and report RPE using BORG scale, to self-measure pulse accurately, and to acknowledge the importance of the exercise prescription.  Exercise Prescription Goal: Starting with aerobic activity 30 plus minutes a day, 3 days per week for initial exercise prescription. Provide home exercise prescription and guidelines that  participant acknowledges understanding prior to discharge.  Activity Barriers & Risk Stratification:     Activity Barriers & Cardiac Risk Stratification - 07/05/17 1432      Activity Barriers & Cardiac Risk Stratification   Activity Barriers Other (comment);Deconditioning   Cardiac Risk Stratification High      6 Minute Walk:     6 Minute Walk    Row Name 07/05/17 1034         6 Minute Walk   Phase Initial     Distance 1345 feet     Walk Time 6 minutes     # of Rest Breaks 0     MPH 2.55     METS 3.21     RPE 13     VO2 Peak 11.24     Symptoms No     Resting HR 99 bpm     Resting BP 124/64     Max Ex. HR 128 bpm     Max Ex. BP 146/80        Oxygen Initial Assessment:   Oxygen Re-Evaluation:   Oxygen Discharge (Final Oxygen Re-Evaluation):   Initial Exercise Prescription:     Initial Exercise Prescription - 07/05/17 1200      Date of Initial Exercise RX and Referring Provider   Date 07/05/17   Referring Provider Croitoru, Mihai MD     Treadmill   MPH 1.7   Grade 1   Minutes 10   METs 2     T5 Nustep   Level 3   SPM 80   Minutes 20   METs 2     Prescription Details   Frequency (times per week) 3   Duration Progress to 45 minutes of aerobic exercise without signs/symptoms of physical distress     Intensity   THRR 40-80% of Max Heartrate 69-138   Ratings of Perceived Exertion 11-13   Perceived Dyspnea 0-4     Progression   Progression Continue to progress workloads to maintain intensity without signs/symptoms of physical distress.     Resistance Training   Training Prescription Yes   Weight 3   Reps 10-15      Perform Capillary Blood Glucose checks as needed.  Exercise Prescription Changes:   Exercise Comments:   Exercise Goals and Review:     Exercise Goals    Row Name 07/05/17 0959             Exercise Goals   Increase Physical Activity Yes       Intervention Provide advice, education, support and counseling  about physical activity/exercise needs.;Develop an individualized exercise prescription for aerobic and resistive training based on initial evaluation findings,  risk stratification, comorbidities and participant's personal goals.       Expected Outcomes Achievement of increased cardiorespiratory fitness and enhanced flexibility, muscular endurance and strength shown through measurements of functional capacity and personal statement of participant.       Increase Strength and Stamina Yes       Intervention Provide advice, education, support and counseling about physical activity/exercise needs.;Develop an individualized exercise prescription for aerobic and resistive training based on initial evaluation findings, risk stratification, comorbidities and participant's personal goals.       Expected Outcomes Achievement of increased cardiorespiratory fitness and enhanced flexibility, muscular endurance and strength shown through measurements of functional capacity and personal statement of participant.          Exercise Goals Re-Evaluation :    Discharge Exercise Prescription (Final Exercise Prescription Changes):   Nutrition:  Target Goals: Understanding of nutrition guidelines, daily intake of sodium 1500mg , cholesterol 200mg , calories 30% from fat and 7% or less from saturated fats, daily to have 5 or more servings of fruits and vegetables.  Biometrics:     Pre Biometrics - 07/05/17 1427      Pre Biometrics   Height 6' (1.829 m)   Weight (!)  364 lb 3.2 oz (165.2 kg)   Waist Circumference 56.5 inches   Hip Circumference 69 inches   Waist to Hip Ratio 0.82 %   BMI (Calculated) 49.5   Triceps Skinfold 44 mm   % Body Fat 46.8 %   Grip Strength 38.5 kg   Flexibility 15.5 in   Single Leg Stand 12.6 seconds       Nutrition Therapy Plan and Nutrition Goals:     Nutrition Therapy & Goals - 07/05/17 1055      Nutrition Therapy   Diet Renal, Heart Healthy   Drug/Food Interactions  Purine/Gout     Personal Nutrition Goals   Nutrition Goal Pt to increase self efficacy re: combining Heart Healthy and Kidney diets.   Personal Goal #2 Pt to identify food quantities necessary to achieve weight loss of 6-24 lb (2.7-10.9 kg) at graduation from cardiac rehab.     Intervention Plan   Intervention Prescribe, educate and counsel regarding individualized specific dietary modifications aiming towards targeted core components such as weight, hypertension, lipid management, diabetes, heart failure and other comorbidities.;Nutrition handout(s) given to patient.  Potassium Content of Foods, Phosphorus Content of Foods, and Purine Restricted Nutrition Therapy   Expected Outcomes Short Term Goal: Understand basic principles of dietary content, such as calories, fat, sodium, cholesterol and nutrients.;Long Term Goal: Adherence to prescribed nutrition plan.      Nutrition Discharge: Nutrition Scores:     Nutrition Assessments - 07/05/17 1106      MEDFICTS Scores   Pre Score 56      Nutrition Goals Re-Evaluation:   Nutrition Goals Re-Evaluation:   Nutrition Goals Discharge (Final Nutrition Goals Re-Evaluation):   Psychosocial: Target Goals: Acknowledge presence or absence of significant depression and/or stress, maximize coping skills, provide positive support system. Participant is able to verbalize types and ability to use techniques and skills needed for reducing stress and depression.  Initial Review & Psychosocial Screening:     Initial Psych Review & Screening - 07/05/17 1422      Initial Review   Current issues with None Identified     Family Dynamics   Good Support System? Yes     Barriers   Psychosocial barriers to participate in program There are no identifiable barriers or psychosocial needs.;The patient  should benefit from training in stress management and relaxation.     Screening Interventions   Interventions Encouraged to exercise      Quality of  Life Scores:     Quality of Life - 07/05/17 1205      Quality of Life Scores   Health/Function Pre 19.11 %   Socioeconomic Pre 18 %   Psych/Spiritual Pre 24 %   Family Pre 12 %   GLOBAL Pre 19.05 %      PHQ-9: Recent Review Flowsheet Data    Depression screen [facility] 2/9 07/11/2017   Decreased Interest 0   Down, Depressed, Hopeless 0   PHQ - 2 Score 0     Interpretation of Total Score  Total Score Depression Severity:  1-4 = Minimal depression, 5-9 = Mild depression, 10-14 = Moderate depression, 15-19 = Moderately severe depression, 20-27 = Severe depression   Psychosocial Evaluation and Intervention:   Psychosocial Re-Evaluation:   Psychosocial Discharge (Final Psychosocial Re-Evaluation):   Vocational Rehabilitation: Provide vocational rehab assistance to qualifying candidates.   Vocational Rehab Evaluation & Intervention:     Vocational Rehab - 07/05/17 1409      Initial Vocational Rehab Evaluation & Intervention   Assessment shows need for Vocational Rehabilitation Yes      Education: Education Goals: Education classes will be provided on a weekly basis, covering required topics. Participant will state understanding/return demonstration of topics presented.  Learning Barriers/Preferences:     Learning Barriers/Preferences - 07/05/17 1409      Learning Barriers/Preferences   Learning Barriers Sight  wears contacts   Learning Preferences Verbal Instruction;Written Material;Audio      Education Topics: Count Your Pulse:  -Group instruction provided by verbal instruction, demonstration, patient participation and written materials to support subject.  Instructors address importance of being able to find your pulse and how to count your pulse when at home without a heart monitor.  Patients get hands on experience counting their pulse with staff help and individually.   Heart Attack, Angina, and Risk Factor Modification:  -Group instruction provided by  verbal instruction, video, and written materials to support subject.  Instructors address signs and symptoms of angina and heart attacks.    Also discuss risk factors for heart disease and how to make changes to improve heart health risk factors.   Functional Fitness:  -Group instruction provided by verbal instruction, demonstration, patient participation, and written materials to support subject.  Instructors address safety measures for doing things around the house.  Discuss how to get up and down off the floor, how to pick things up properly, how to safely get out of a chair without assistance, and balance training.   Meditation and Mindfulness:  -Group instruction provided by verbal instruction, patient participation, and written materials to support subject.  Instructor addresses importance of mindfulness and meditation practice to help reduce stress and improve awareness.  Instructor also leads participants through a meditation exercise.    Stretching for Flexibility and Mobility:  -Group instruction provided by verbal instruction, patient participation, and written materials to support subject.  Instructors lead participants through series of stretches that are designed to increase flexibility thus improving mobility.  These stretches are additional exercise for major muscle groups that are typically performed during regular warm up and cool down.   Hands Only CPR:  -Group verbal, video, and participation provides a basic overview of AHA guidelines for community CPR. Role-play of emergencies allow participants the opportunity to practice calling for help and chest compression  technique with discussion of AED use.   Hypertension: -Group verbal and written instruction that provides a basic overview of hypertension including the most recent diagnostic guidelines, risk factor reduction with self-care instructions and medication management.    Nutrition I class: Heart Healthy Eating:   -Group instruction provided by PowerPoint slides, verbal discussion, and written materials to support subject matter. The instructor gives an explanation and review of the Therapeutic Lifestyle Changes diet recommendations, which includes a discussion on lipid goals, dietary fat, sodium, fiber, plant stanol/sterol esters, sugar, and the components of a well-balanced, healthy diet.   Nutrition II class: Lifestyle Skills:  -Group instruction provided by PowerPoint slides, verbal discussion, and written materials to support subject matter. The instructor gives an explanation and review of label reading, grocery shopping for heart health, heart healthy recipe modifications, and ways to make healthier choices when eating out.   Diabetes Question & Answer:  -Group instruction provided by PowerPoint slides, verbal discussion, and written materials to support subject matter. The instructor gives an explanation and review of diabetes co-morbidities, pre- and post-prandial blood glucose goals, pre-exercise blood glucose goals, signs, symptoms, and treatment of hypoglycemia and hyperglycemia, and foot care basics.   Diabetes Blitz:  -Group instruction provided by PowerPoint slides, verbal discussion, and written materials to support subject matter. The instructor gives an explanation and review of the physiology behind type 1 and type 2 diabetes, diabetes medications and rational behind using different medications, pre- and post-prandial blood glucose recommendations and Hemoglobin A1c goals, diabetes diet, and exercise including blood glucose guidelines for exercising safely.    Portion Distortion:  -Group instruction provided by PowerPoint slides, verbal discussion, written materials, and food models to support subject matter. The instructor gives an explanation of serving size versus portion size, changes in portions sizes over the last 20 years, and what consists of a serving from each food  group.   Stress Management:  -Group instruction provided by verbal instruction, video, and written materials to support subject matter.  Instructors review role of stress in heart disease and how to cope with stress positively.     Exercising on Your Own:  -Group instruction provided by verbal instruction, power point, and written materials to support subject.  Instructors discuss benefits of exercise, components of exercise, frequency and intensity of exercise, and end points for exercise.  Also discuss use of nitroglycerin and activating EMS.  Review options of places to exercise outside of rehab.  Review guidelines for sex with heart disease.   Cardiac Drugs I:  -Group instruction provided by verbal instruction and written materials to support subject.  Instructor reviews cardiac drug classes: antiplatelets, anticoagulants, beta blockers, and statins.  Instructor discusses reasons, side effects, and lifestyle considerations for each drug class.   Cardiac Drugs II:  -Group instruction provided by verbal instruction and written materials to support subject.  Instructor reviews cardiac drug classes: angiotensin converting enzyme inhibitors (ACE-I), angiotensin II receptor blockers (ARBs), nitrates, and calcium channel blockers.  Instructor discusses reasons, side effects, and lifestyle considerations for each drug class.   Anatomy and Physiology of the Circulatory System:  Group verbal and written instruction and models provide basic cardiac anatomy and physiology, with the coronary electrical and arterial systems. Review of: AMI, Angina, Valve disease, Heart Failure, Peripheral Artery Disease, Cardiac Arrhythmia, Pacemakers, and the ICD.   Other Education:  -Group or individual verbal, written, or video instructions that support the educational goals of the cardiac rehab program.   Knowledge Questionnaire Score:  Knowledge Questionnaire Score - 07/05/17 1006      Knowledge  Questionnaire Score   Pre Score 22/24      Core Components/Risk Factors/Patient Goals at Admission:     Personal Goals and Risk Factors at Admission - 07/05/17 1408      Core Components/Risk Factors/Patient Goals on Admission    Weight Management Yes;Obesity;Weight Maintenance;Weight Loss   Intervention Weight Management: Develop a combined nutrition and exercise program designed to reach desired caloric intake, while maintaining appropriate intake of nutrient and fiber, sodium and fats, and appropriate energy expenditure required for the weight goal.;Weight Management: Provide education and appropriate resources to help participant work on and attain dietary goals.;Weight Management/Obesity: Establish reasonable short term and long term weight goals.;Obesity: Provide education and appropriate resources to help participant work on and attain dietary goals.   Expected Outcomes Short Term: Continue to assess and modify interventions until short term weight is achieved;Long Term: Adherence to nutrition and physical activity/exercise program aimed toward attainment of established weight goal;Weight Maintenance: Understanding of the daily nutrition guidelines, which includes 25-35% calories from fat, 7% or less cal from saturated fats, less than 200mg  cholesterol, less than 1.5gm of sodium, & 5 or more servings of fruits and vegetables daily;Weight Loss: Understanding of general recommendations for a balanced deficit meal plan, which promotes 1-2 lb weight loss per week and includes a negative energy balance of [phone] kcal/d;Understanding recommendations for meals to include 15-35% energy as protein, 25-35% energy from fat, 35-60% energy from carbohydrates, less than 200mg  of dietary cholesterol, 20-35 gm of total fiber daily;Understanding of distribution of calorie intake throughout the day with the consumption of 4-5 meals/snacks   Hypertension Yes   Intervention Provide education on lifestyle  modifcations including regular physical activity/exercise, weight management, moderate sodium restriction and increased consumption of fresh fruit, vegetables, and low fat dairy, alcohol moderation, and smoking cessation.;Monitor prescription use compliance.   Expected Outcomes Short Term: Continued assessment and intervention until BP is < 140/28mm HG in hypertensive participants. < 130/32mm HG in hypertensive participants with diabetes, heart failure or chronic kidney disease.;Long Term: Maintenance of blood pressure at goal levels.   Stress Yes   Intervention Offer individual and/or small group education and counseling on adjustment to heart disease, stress management and health-related lifestyle change. Teach and support self-help strategies.;Refer participants experiencing significant psychosocial distress to appropriate mental health specialists for further evaluation and treatment. When possible, include family members and significant others in education/counseling sessions.   Expected Outcomes Short Term: Participant demonstrates changes in health-related behavior, relaxation and other stress management skills, ability to obtain effective social support, and compliance with psychotropic medications if prescribed.;Long Term: Emotional wellbeing is indicated by absence of clinically significant psychosocial distress or social isolation.      Core Components/Risk Factors/Patient Goals Review:    Core Components/Risk Factors/Patient Goals at Discharge (Final Review):    ITP Comments:     ITP Comments    Row Name 07/05/17 0925           ITP Comments Dr. Fransico Him, Medical Director          Comments: Pt started cardiac rehab today.  Pt tolerated light exercise without difficulty. VSS, telemetry-Sinus Rhythm , asymptomatic.  Medication list reconciled. Pt denies barriers to medicaiton compliance.  PSYCHOSOCIAL ASSESSMENT:  PHQ-0. Pt exhibits positive coping skills, hopeful outlook with  supportive family. No psychosocial needs identified at this time, no psychosocial interventions necessary.    Pt enjoys church and fishing.   Pt  oriented to exercise equipment and routine.    Understanding verbalized.off to a good start to exercise. Barnet Pall, RN,BSN 07/11/2017 12:41 PM

## 2017-07-13 ENCOUNTER — Encounter: Payer: Self-pay | Admitting: Family

## 2017-07-13 ENCOUNTER — Encounter (HOSPITAL_COMMUNITY)
Admission: RE | Admit: 2017-07-13 | Discharge: 2017-07-13 | Disposition: A | Discharge status: Home / Self Care | Payer: Medicare Other | Source: Ambulatory Visit | Attending: Cardiovascular Disease | Admitting: Cardiovascular Disease

## 2017-07-13 ENCOUNTER — Encounter (HOSPITAL_COMMUNITY): Payer: Medicare Other

## 2017-07-13 DIAGNOSIS — Z8679 Personal history of other diseases of the circulatory system: Secondary | ICD-10-CM | POA: Diagnosis not present

## 2017-07-13 DIAGNOSIS — Z79899 Other long term (current) drug therapy: Secondary | ICD-10-CM | POA: Diagnosis not present

## 2017-07-13 DIAGNOSIS — I129 Hypertensive chronic kidney disease with stage 1 through stage 4 chronic kidney disease, or unspecified chronic kidney disease: Secondary | ICD-10-CM | POA: Diagnosis not present

## 2017-07-13 DIAGNOSIS — Z9889 Other specified postprocedural states: Secondary | ICD-10-CM

## 2017-07-13 DIAGNOSIS — I719 Aortic aneurysm of unspecified site, without rupture: Secondary | ICD-10-CM | POA: Diagnosis not present

## 2017-07-13 DIAGNOSIS — Z952 Presence of prosthetic heart valve: Secondary | ICD-10-CM | POA: Diagnosis not present

## 2017-07-13 DIAGNOSIS — N189 Chronic kidney disease, unspecified: Secondary | ICD-10-CM | POA: Diagnosis not present

## 2017-07-13 DIAGNOSIS — Z8673 Personal history of transient ischemic attack (TIA), and cerebral infarction without residual deficits: Secondary | ICD-10-CM | POA: Diagnosis not present

## 2017-07-16 ENCOUNTER — Encounter (HOSPITAL_COMMUNITY): Payer: Medicare Other

## 2017-07-16 ENCOUNTER — Telehealth: Payer: Self-pay | Admitting: Cardiovascular Disease

## 2017-07-16 ENCOUNTER — Encounter (HOSPITAL_COMMUNITY)
Admission: RE | Admit: 2017-07-16 | Discharge: 2017-07-16 | Disposition: A | Discharge status: Home / Self Care | Payer: Medicare Other | Source: Ambulatory Visit | Attending: Cardiovascular Disease | Admitting: Cardiovascular Disease

## 2017-07-16 DIAGNOSIS — I719 Aortic aneurysm of unspecified site, without rupture: Secondary | ICD-10-CM | POA: Diagnosis not present

## 2017-07-16 DIAGNOSIS — Z8679 Personal history of other diseases of the circulatory system: Secondary | ICD-10-CM | POA: Diagnosis not present

## 2017-07-16 DIAGNOSIS — I129 Hypertensive chronic kidney disease with stage 1 through stage 4 chronic kidney disease, or unspecified chronic kidney disease: Secondary | ICD-10-CM | POA: Diagnosis not present

## 2017-07-16 DIAGNOSIS — N189 Chronic kidney disease, unspecified: Secondary | ICD-10-CM | POA: Diagnosis not present

## 2017-07-16 DIAGNOSIS — Z8673 Personal history of transient ischemic attack (TIA), and cerebral infarction without residual deficits: Secondary | ICD-10-CM | POA: Diagnosis not present

## 2017-07-16 DIAGNOSIS — Z952 Presence of prosthetic heart valve: Secondary | ICD-10-CM | POA: Diagnosis not present

## 2017-07-16 DIAGNOSIS — Z9889 Other specified postprocedural states: Secondary | ICD-10-CM

## 2017-07-16 DIAGNOSIS — Z79899 Other long term (current) drug therapy: Secondary | ICD-10-CM | POA: Diagnosis not present

## 2017-07-16 MED ORDER — APIXABAN 2.5 MG PO TABS: 2.5000 mg | ORAL_TABLET | Freq: Two times a day (BID) | ORAL | 0 refills | Status: DC

## 2017-07-16 NOTE — Telephone Encounter (Signed)
Placed samples at the front for pickup; patient directly notified

## 2017-07-16 NOTE — Telephone Encounter (Signed)
Pt requesting samples of Eliquis 2.5mg -pls advise [phone]

## 2017-07-18 ENCOUNTER — Encounter (HOSPITAL_COMMUNITY): Payer: Medicare Other

## 2017-07-20 ENCOUNTER — Encounter (HOSPITAL_COMMUNITY)
Admission: RE | Admit: 2017-07-20 | Discharge: 2017-07-20 | Disposition: A | Discharge status: Home / Self Care | Payer: Medicare Other | Source: Ambulatory Visit | Attending: Cardiovascular Disease | Admitting: Cardiovascular Disease

## 2017-07-20 ENCOUNTER — Encounter (HOSPITAL_COMMUNITY): Payer: Medicare Other

## 2017-07-20 DIAGNOSIS — Z9889 Other specified postprocedural states: Secondary | ICD-10-CM | POA: Diagnosis not present

## 2017-07-20 DIAGNOSIS — Z8679 Personal history of other diseases of the circulatory system: Secondary | ICD-10-CM | POA: Diagnosis not present

## 2017-07-20 DIAGNOSIS — Z79899 Other long term (current) drug therapy: Secondary | ICD-10-CM | POA: Diagnosis not present

## 2017-07-20 DIAGNOSIS — I129 Hypertensive chronic kidney disease with stage 1 through stage 4 chronic kidney disease, or unspecified chronic kidney disease: Secondary | ICD-10-CM | POA: Diagnosis not present

## 2017-07-20 DIAGNOSIS — N189 Chronic kidney disease, unspecified: Secondary | ICD-10-CM | POA: Diagnosis not present

## 2017-07-20 DIAGNOSIS — I719 Aortic aneurysm of unspecified site, without rupture: Secondary | ICD-10-CM | POA: Diagnosis not present

## 2017-07-20 DIAGNOSIS — Z952 Presence of prosthetic heart valve: Secondary | ICD-10-CM | POA: Diagnosis not present

## 2017-07-20 DIAGNOSIS — Z8673 Personal history of transient ischemic attack (TIA), and cerebral infarction without residual deficits: Secondary | ICD-10-CM | POA: Diagnosis not present

## 2017-07-23 ENCOUNTER — Encounter (HOSPITAL_COMMUNITY): Payer: Medicare Other

## 2017-07-23 ENCOUNTER — Encounter: Payer: Self-pay | Admitting: Surgery

## 2017-07-23 ENCOUNTER — Ambulatory Visit (INDEPENDENT_AMBULATORY_CARE_PROVIDER_SITE_OTHER): Payer: Medicare Other | Admitting: Surgery

## 2017-07-23 ENCOUNTER — Encounter (HOSPITAL_COMMUNITY)
Admission: RE | Admit: 2017-07-23 | Discharge: 2017-07-23 | Disposition: A | Discharge status: Home / Self Care | Payer: Medicare Other | Source: Ambulatory Visit | Attending: Cardiovascular Disease | Admitting: Cardiovascular Disease

## 2017-07-23 VITALS — BP 138/88 | HR 110 | Temp 97.9°F | Resp 20 | Ht 73.0 in | Wt 368.0 lb

## 2017-07-23 DIAGNOSIS — I714 Abdominal aortic aneurysm, without rupture, unspecified: Secondary | ICD-10-CM

## 2017-07-23 DIAGNOSIS — Z952 Presence of prosthetic heart valve: Secondary | ICD-10-CM | POA: Diagnosis not present

## 2017-07-23 DIAGNOSIS — Z992 Dependence on renal dialysis: Secondary | ICD-10-CM

## 2017-07-23 DIAGNOSIS — Z79899 Other long term (current) drug therapy: Secondary | ICD-10-CM | POA: Diagnosis not present

## 2017-07-23 DIAGNOSIS — N186 End stage renal disease: Secondary | ICD-10-CM

## 2017-07-23 DIAGNOSIS — N189 Chronic kidney disease, unspecified: Secondary | ICD-10-CM | POA: Diagnosis not present

## 2017-07-23 DIAGNOSIS — Z8673 Personal history of transient ischemic attack (TIA), and cerebral infarction without residual deficits: Secondary | ICD-10-CM | POA: Diagnosis not present

## 2017-07-23 DIAGNOSIS — Z9889 Other specified postprocedural states: Secondary | ICD-10-CM

## 2017-07-23 DIAGNOSIS — Z8679 Personal history of other diseases of the circulatory system: Secondary | ICD-10-CM | POA: Diagnosis not present

## 2017-07-23 DIAGNOSIS — I719 Aortic aneurysm of unspecified site, without rupture: Secondary | ICD-10-CM | POA: Diagnosis not present

## 2017-07-23 DIAGNOSIS — I129 Hypertensive chronic kidney disease with stage 1 through stage 4 chronic kidney disease, or unspecified chronic kidney disease: Secondary | ICD-10-CM | POA: Diagnosis not present

## 2017-07-23 NOTE — Progress Notes (Signed)
[facility]  Patient name: [name] MRN: [id number] DOB: Apr 27, 1971 Sex: male   REASON FOR VISIT:    follow up  [name]:   [name] is a 46 y.o. male who is a former patient of Dr. Kellie Simmering.  He is status post percutaneous endovascular aneurysm repair of a 5.6 centimeter infrarenal abdominal aortic aneurysm secondary to a chronic dissection on 09/25/2012.  He also has stage IV chronic renal insufficiency, status post left radiocephalic fistula creation.  He is not yet on dialysis.  His most recent noncontrast CT scan has showed a slight increase in the size of his aneurysm.  He also is being evaluated for a 5.9 cm ascending aortic aneurysm  He ended up having aortic arch and valve surgery at [facility] earlier this year.  He is not yet on dialysis.  He is concerned about the size of his fistula.  He is currently doing cardiac rehabilitation.   PAST MEDICAL HISTORY:   Past Medical History:  Diagnosis Date  . Abdominal aortic aneurysm ([place])   . Anemia   . Anxiety   . Aortic regurgitation 12/10/08   Echo: mild to mod. AOV regurg,mild AO root dilatation, LA mildly dilated, EF 65%, LV wall thickness markedly increased, small PE  . Chronic kidney disease   . Chronic renal disease    advanced  . DVT (deep venous thrombosis) ([place])   . H/O aortic dissection 2009   infarenal abdominal aortic dissection  . History of pulmonary embolus (PE) 2010  . Hypertension    dr [name]  . Left ventricular hypertrophy   . Obesity   . Stroke [facility] 2009   pt says no stroke     FAMILY HISTORY:   Family History  Problem Relation Age of Onset  . Hypertension Mother   . Hypertension Father     SOCIAL HISTORY:   Social History  Substance Use Topics  . Smoking status: Never Smoker  . Smokeless tobacco: Never Used  . Alcohol use No     ALLERGIES:   Allergies  Allergen Reactions  .  Roxicodone [Oxycodone Hcl] Hives and Itching    Hives and itching all over     CURRENT MEDICATIONS:   Current Outpatient Prescriptions  Medication Sig Dispense Refill  . acetaminophen (TYLENOL) 500 MG tablet Take 1,000 mg by mouth every 6 (six) hours as needed for moderate pain or headache. Reported on 12/14/2015    . allopurinol (ZYLOPRIM) 300 MG tablet TAKE 1 TABLET BY MOUTH  DAILY (Patient taking differently: TAKE 1/2 TABLET BY MOUTH  DAILY) 90 tablet 1  . apixaban (ELIQUIS) 2.5 MG TABS tablet Take 1 tablet (2.5 mg total) by mouth 2 (two) times daily. 28 tablet 0  . aspirin 81 MG chewable tablet Chew 81 mg by mouth daily.    [name] atorvastatin (LIPITOR) 10 MG tablet TAKE 1 TABLET BY MOUTH  DAILY 90 tablet 2  . calcitRIOL (ROCALTROL) 0.25 MCG capsule TAKE 1 CAPSULE BY MOUTH  EVERY MONDAY, WEDNESDAY,  AND FRIDAY. 24 capsule 4  . carvedilol (COREG) 25 MG tablet Take 1 tablet (25 mg total) by mouth 2 (two) times daily. 180 tablet 3  . cetirizine (ZYRTEC) 10 MG tablet Take 10 mg by mouth daily.    . furosemide (LASIX) 40 MG tablet Take 1 tablet (40 mg total) by mouth daily. 90 tablet 3  . Olmesartan-Amlodipine-HCTZ 40-10-12.5 MG TABS Take 1 tablet by mouth daily. 90 tablet 2  .  sennosides-docusate sodium (SENOKOT-S) 8.6-50 MG tablet Take 1 tablet by mouth 2 (two) times daily.     No current facility-administered medications for this visit.     REVIEW OF SYSTEMS:   [X]  denotes positive finding, [ ]  denotes negative finding Cardiac  Comments:  Chest pain or chest pressure:    Shortness of breath upon exertion:    Short of breath when lying flat:    Irregular heart rhythm:        Vascular    Pain in calf, thigh, or hip brought on by ambulation:    Pain in feet at night that wakes you up from your sleep:     Blood clot in your veins:    Leg swelling:         Pulmonary    Oxygen at home:    Productive cough:     Wheezing:         Neurologic    Sudden weakness in arms or legs:       Sudden numbness in arms or legs:     Sudden onset of difficulty speaking or slurred speech:    Temporary loss of vision in one eye:     Problems with dizziness:         Gastrointestinal    Blood in stool:     Vomited blood:         Genitourinary    Burning when urinating:     Blood in urine:        Psychiatric    Major depression:         Hematologic    Bleeding problems:    Problems with blood clotting too easily:        Skin    Rashes or ulcers:        Constitutional    Fever or chills:      PHYSICAL EXAM:   Vitals:   07/23/17 1155  BP: 138/88  Pulse: (!) 110  Resp: 20  Temp: 97.9 F (36.6 C)  TempSrc: Oral  SpO2: 96%  Weight: (!) 368 lb (166.9 kg)  Height: 6\' 1"  (1.854 m)    GENERAL: The patient is a well-nourished male, in no acute distress. The vital signs are documented above. CARDIAC: There is a regular rate and rhythm.  VASCULAR: Aneurysmal left radiocephalic fistula PULMONARY: Non-labored respirations MUSCULOSKELETAL: There are no major deformities or cyanosis. NEUROLOGIC: No focal weakness or paresthesias are detected. SKIN: There are no ulcers or rashes noted. PSYCHIATRIC: The patient has a normal affect.  STUDIES:   None  MEDICAL ISSUES:   Aneurysmal left radiocephalic fistula: The patient feels that this may be getting bigger.  I offered him aneurysmorrhaphy once he completes his cardiac rehabilitation which will be in about 10 weeks.  I told him I would go up from the anastomosis to the mid forearm and plicated the aneurysm as well as address the kink in the upper arm.  AAA: The patient did not get the CT scan that I had ordered today, likely because of his recent surgery at [facility].  He is going to try to get images from a low-dose contrast CT scan that was done it did so that I can review them.  If the abdominal aorta was not visualized, I have ordered a noncontrast CT scan of the abdomen and pelvis for me to review when he comes back to see  if the abdominal aneurysm has increased in size.    Annamarie Major, MD [facility] [phone] Pager [phone]

## 2017-07-24 NOTE — Addendum Note (Signed)
Addended by: Lianne Cure A on: 07/24/2017 09:07 AM   Modules accepted: Orders

## 2017-07-25 ENCOUNTER — Encounter (HOSPITAL_COMMUNITY)
Admission: RE | Admit: 2017-07-25 | Discharge: 2017-07-25 | Disposition: A | Discharge status: Home / Self Care | Payer: Medicare Other | Source: Ambulatory Visit | Attending: Cardiovascular Disease | Admitting: Cardiovascular Disease

## 2017-07-25 ENCOUNTER — Encounter (HOSPITAL_COMMUNITY): Payer: Medicare Other

## 2017-07-25 DIAGNOSIS — Z8673 Personal history of transient ischemic attack (TIA), and cerebral infarction without residual deficits: Secondary | ICD-10-CM | POA: Insufficient documentation

## 2017-07-25 DIAGNOSIS — Z9889 Other specified postprocedural states: Secondary | ICD-10-CM | POA: Insufficient documentation

## 2017-07-25 DIAGNOSIS — Z952 Presence of prosthetic heart valve: Secondary | ICD-10-CM | POA: Diagnosis not present

## 2017-07-25 DIAGNOSIS — Z79899 Other long term (current) drug therapy: Secondary | ICD-10-CM | POA: Diagnosis not present

## 2017-07-25 DIAGNOSIS — N189 Chronic kidney disease, unspecified: Secondary | ICD-10-CM | POA: Diagnosis not present

## 2017-07-25 DIAGNOSIS — I719 Aortic aneurysm of unspecified site, without rupture: Secondary | ICD-10-CM | POA: Diagnosis not present

## 2017-07-25 DIAGNOSIS — I129 Hypertensive chronic kidney disease with stage 1 through stage 4 chronic kidney disease, or unspecified chronic kidney disease: Secondary | ICD-10-CM | POA: Insufficient documentation

## 2017-07-25 DIAGNOSIS — Z8679 Personal history of other diseases of the circulatory system: Secondary | ICD-10-CM | POA: Diagnosis not present

## 2017-07-25 NOTE — Progress Notes (Signed)
Reviewed home exercise program with pt.  Discussed mode/frequency/intensity of exercise, THRR, RPE scale and weather conditions for exercising outdoors.  Also discussed signs and symptoms and when to call Dr./911.  Pt verbalized understanding.    Cleda Mccreedy, MS ACSM RCEP 07/25/17 1120

## 2017-07-27 ENCOUNTER — Encounter (HOSPITAL_COMMUNITY): Payer: Medicare Other

## 2017-07-30 ENCOUNTER — Encounter (HOSPITAL_COMMUNITY)
Admission: RE | Admit: 2017-07-30 | Discharge: 2017-07-30 | Disposition: A | Discharge status: Home / Self Care | Payer: Medicare Other | Source: Ambulatory Visit | Attending: Cardiovascular Disease | Admitting: Cardiovascular Disease

## 2017-07-30 ENCOUNTER — Encounter (HOSPITAL_COMMUNITY): Payer: Medicare Other

## 2017-07-30 DIAGNOSIS — Z79899 Other long term (current) drug therapy: Secondary | ICD-10-CM | POA: Diagnosis not present

## 2017-07-30 DIAGNOSIS — I719 Aortic aneurysm of unspecified site, without rupture: Secondary | ICD-10-CM | POA: Diagnosis not present

## 2017-07-30 DIAGNOSIS — I129 Hypertensive chronic kidney disease with stage 1 through stage 4 chronic kidney disease, or unspecified chronic kidney disease: Secondary | ICD-10-CM | POA: Diagnosis not present

## 2017-07-30 DIAGNOSIS — Z8673 Personal history of transient ischemic attack (TIA), and cerebral infarction without residual deficits: Secondary | ICD-10-CM | POA: Diagnosis not present

## 2017-07-30 DIAGNOSIS — Z9889 Other specified postprocedural states: Secondary | ICD-10-CM

## 2017-07-30 DIAGNOSIS — N189 Chronic kidney disease, unspecified: Secondary | ICD-10-CM | POA: Diagnosis not present

## 2017-07-30 DIAGNOSIS — Z952 Presence of prosthetic heart valve: Secondary | ICD-10-CM | POA: Diagnosis not present

## 2017-07-30 DIAGNOSIS — Z8679 Personal history of other diseases of the circulatory system: Secondary | ICD-10-CM | POA: Diagnosis not present

## 2017-07-31 ENCOUNTER — Telehealth: Payer: Self-pay | Admitting: Cardiovascular Disease

## 2017-07-31 MED ORDER — APIXABAN 2.5 MG PO TABS: 2.5000 mg | ORAL_TABLET | Freq: Two times a day (BID) | ORAL | 0 refills | Status: DC

## 2017-07-31 NOTE — Telephone Encounter (Signed)
LEFT VOICE MESSAGE THAT SAMPLES MAY BE PICKED UP TOMORROW

## 2017-07-31 NOTE — Telephone Encounter (Signed)
New message      Patient calling the office for samples of medication:   1.  What medication and dosage are you requesting samples for?  eliquis 2.5mg  2.  Are you currently out of this medication? Almost out of medication

## 2017-08-01 ENCOUNTER — Encounter (HOSPITAL_COMMUNITY)
Admission: RE | Admit: 2017-08-01 | Discharge: 2017-08-01 | Disposition: A | Discharge status: Home / Self Care | Payer: Medicare Other | Source: Ambulatory Visit | Attending: Cardiovascular Disease | Admitting: Cardiovascular Disease

## 2017-08-01 ENCOUNTER — Encounter (HOSPITAL_COMMUNITY): Payer: Medicare Other

## 2017-08-01 DIAGNOSIS — Z8673 Personal history of transient ischemic attack (TIA), and cerebral infarction without residual deficits: Secondary | ICD-10-CM | POA: Diagnosis not present

## 2017-08-01 DIAGNOSIS — Z9889 Other specified postprocedural states: Secondary | ICD-10-CM | POA: Diagnosis not present

## 2017-08-01 DIAGNOSIS — I129 Hypertensive chronic kidney disease with stage 1 through stage 4 chronic kidney disease, or unspecified chronic kidney disease: Secondary | ICD-10-CM | POA: Diagnosis not present

## 2017-08-01 DIAGNOSIS — N189 Chronic kidney disease, unspecified: Secondary | ICD-10-CM | POA: Diagnosis not present

## 2017-08-01 DIAGNOSIS — Z79899 Other long term (current) drug therapy: Secondary | ICD-10-CM | POA: Diagnosis not present

## 2017-08-01 DIAGNOSIS — Z952 Presence of prosthetic heart valve: Secondary | ICD-10-CM | POA: Diagnosis not present

## 2017-08-01 DIAGNOSIS — I719 Aortic aneurysm of unspecified site, without rupture: Secondary | ICD-10-CM | POA: Diagnosis not present

## 2017-08-01 DIAGNOSIS — Z8679 Personal history of other diseases of the circulatory system: Secondary | ICD-10-CM | POA: Diagnosis not present

## 2017-08-03 ENCOUNTER — Encounter (HOSPITAL_COMMUNITY)
Admission: RE | Admit: 2017-08-03 | Discharge: 2017-08-03 | Disposition: A | Discharge status: Home / Self Care | Payer: Medicare Other | Source: Ambulatory Visit | Attending: Cardiovascular Disease | Admitting: Cardiovascular Disease

## 2017-08-03 ENCOUNTER — Encounter (HOSPITAL_COMMUNITY): Payer: Medicare Other

## 2017-08-03 DIAGNOSIS — Z952 Presence of prosthetic heart valve: Secondary | ICD-10-CM | POA: Diagnosis not present

## 2017-08-03 DIAGNOSIS — Z9889 Other specified postprocedural states: Secondary | ICD-10-CM | POA: Diagnosis not present

## 2017-08-03 DIAGNOSIS — I129 Hypertensive chronic kidney disease with stage 1 through stage 4 chronic kidney disease, or unspecified chronic kidney disease: Secondary | ICD-10-CM | POA: Diagnosis not present

## 2017-08-03 DIAGNOSIS — Z8673 Personal history of transient ischemic attack (TIA), and cerebral infarction without residual deficits: Secondary | ICD-10-CM | POA: Diagnosis not present

## 2017-08-03 DIAGNOSIS — I719 Aortic aneurysm of unspecified site, without rupture: Secondary | ICD-10-CM | POA: Diagnosis not present

## 2017-08-03 DIAGNOSIS — Z8679 Personal history of other diseases of the circulatory system: Secondary | ICD-10-CM | POA: Diagnosis not present

## 2017-08-03 DIAGNOSIS — N189 Chronic kidney disease, unspecified: Secondary | ICD-10-CM | POA: Diagnosis not present

## 2017-08-03 DIAGNOSIS — Z79899 Other long term (current) drug therapy: Secondary | ICD-10-CM | POA: Diagnosis not present

## 2017-08-06 ENCOUNTER — Encounter (HOSPITAL_COMMUNITY): Payer: Medicare Other

## 2017-08-06 ENCOUNTER — Encounter (HOSPITAL_COMMUNITY)
Admission: RE | Admit: 2017-08-06 | Discharge: 2017-08-06 | Disposition: A | Discharge status: Home / Self Care | Payer: Medicare Other | Source: Ambulatory Visit | Attending: Cardiovascular Disease | Admitting: Cardiovascular Disease

## 2017-08-06 DIAGNOSIS — N189 Chronic kidney disease, unspecified: Secondary | ICD-10-CM | POA: Diagnosis not present

## 2017-08-06 DIAGNOSIS — Z952 Presence of prosthetic heart valve: Secondary | ICD-10-CM | POA: Diagnosis not present

## 2017-08-06 DIAGNOSIS — I129 Hypertensive chronic kidney disease with stage 1 through stage 4 chronic kidney disease, or unspecified chronic kidney disease: Secondary | ICD-10-CM | POA: Diagnosis not present

## 2017-08-06 DIAGNOSIS — Z9889 Other specified postprocedural states: Secondary | ICD-10-CM | POA: Diagnosis not present

## 2017-08-06 DIAGNOSIS — Z8679 Personal history of other diseases of the circulatory system: Secondary | ICD-10-CM | POA: Diagnosis not present

## 2017-08-06 DIAGNOSIS — Z79899 Other long term (current) drug therapy: Secondary | ICD-10-CM | POA: Diagnosis not present

## 2017-08-06 DIAGNOSIS — Z8673 Personal history of transient ischemic attack (TIA), and cerebral infarction without residual deficits: Secondary | ICD-10-CM | POA: Diagnosis not present

## 2017-08-06 DIAGNOSIS — I719 Aortic aneurysm of unspecified site, without rupture: Secondary | ICD-10-CM | POA: Diagnosis not present

## 2017-08-06 NOTE — Progress Notes (Signed)
[name] 46 y.o. male       Nutrition Note  s/p AVR  WT: Wt Readings from Last 3 Encounters:  07/23/17 (!) 368 lb (166.9 kg)  07/05/17 (!) 364 lb 3.2 oz (165.2 kg)  06/25/17 (!) 346 lb (156.9 kg)     BMI 49.5   Labs:  No results found for: HGBA1C 05/06/17 HGBA1C 5.7  Note Spoke with pt. Nutrition plan and goals reviewed with pt. Pt is following Step 1 of the Therapeutic Lifestyle Changes diet. Pt is struggling to manage his diet. Pt's wife has MS and "she's having memory issues." Pt does not allow his wife to cook anymore due to her memory deficits. Pt does all of the household chores including taking himself and his wife to all of their medical appointments. Pt continues eating out frequently (at least 3-4 times/week). Pt wt today reportedly 167 kg. Pt has maintained his wt since starting rehab. Pt has focused on making healthier food choices and eating small portions of food. Pt continues to have difficulty combining his kidney and heart diets. Pt is trying to follow a low potassium, low phosphorus diet. Combining a heart healthy and kidney diet discussed. Pt expressed understanding of the information reviewed. Pt aware of nutrition education classes offered.  Nutrition Diagnosis ? Food-and nutrition-related knowledge deficit related to lack of exposure to information as related to diagnosis of: ? CHF ? Obesity related to excessive energy intake as evidenced by a BMI of 49.5  Nutrition Intervention ? Pt's individual nutrition plan reviewed with pt. ? Resources to help with meal preparation given ? Pt given handouts for: ? Nutrition I ? Nutrition II  Nutrition Goal(s):  ? Pt to increase self efficacy re: combining Heart Healthy and Kidney diets. - continue ? Pt to identify food quantities necessary to achieve weight loss of 6-24 lb (2.7-10.9 kg) at graduation from cardiac rehab. - continue  Plan:  Pt to attend nutrition classes ? Portion Distortion  Will provide  client-centered nutrition education as part of interdisciplinary care.   Monitor and evaluate progress toward nutrition goal with team.  Derek Mound, M.Ed, RD, LDN, CDE 08/06/2017 10:49 AM

## 2017-08-08 ENCOUNTER — Encounter (HOSPITAL_COMMUNITY): Payer: Medicare Other

## 2017-08-08 ENCOUNTER — Encounter (HOSPITAL_COMMUNITY)
Admission: RE | Admit: 2017-08-08 | Discharge: 2017-08-08 | Disposition: A | Discharge status: Home / Self Care | Payer: Medicare Other | Source: Ambulatory Visit | Attending: Cardiovascular Disease | Admitting: Cardiovascular Disease

## 2017-08-08 DIAGNOSIS — Z8679 Personal history of other diseases of the circulatory system: Secondary | ICD-10-CM | POA: Diagnosis not present

## 2017-08-08 DIAGNOSIS — I129 Hypertensive chronic kidney disease with stage 1 through stage 4 chronic kidney disease, or unspecified chronic kidney disease: Secondary | ICD-10-CM | POA: Diagnosis not present

## 2017-08-08 DIAGNOSIS — Z8673 Personal history of transient ischemic attack (TIA), and cerebral infarction without residual deficits: Secondary | ICD-10-CM | POA: Diagnosis not present

## 2017-08-08 DIAGNOSIS — Z9889 Other specified postprocedural states: Secondary | ICD-10-CM | POA: Diagnosis not present

## 2017-08-08 DIAGNOSIS — Z952 Presence of prosthetic heart valve: Secondary | ICD-10-CM | POA: Diagnosis not present

## 2017-08-08 DIAGNOSIS — N189 Chronic kidney disease, unspecified: Secondary | ICD-10-CM | POA: Diagnosis not present

## 2017-08-08 DIAGNOSIS — Z79899 Other long term (current) drug therapy: Secondary | ICD-10-CM | POA: Diagnosis not present

## 2017-08-08 DIAGNOSIS — I719 Aortic aneurysm of unspecified site, without rupture: Secondary | ICD-10-CM | POA: Diagnosis not present

## 2017-08-08 NOTE — Progress Notes (Signed)
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  acetaminophen (TYLENOL) 500 MG tablet, Take 1,000 mg by mouth every 6 (six) hours as needed for moderate pain or headache. Reported on 12/14/2015, Disp: , Rfl:  .  allopurinol (ZYLOPRIM) 300 MG tablet, TAKE 1 TABLET BY MOUTH  DAILY (Patient taking differently: TAKE 1/2 TABLET BY MOUTH  DAILY), Disp: 90 tablet, Rfl: 1 .  apixaban (ELIQUIS) 2.5 MG TABS tablet, Take 1 tablet (2.5 mg total) by mouth 2 (two) times daily., Disp: 28 tablet, Rfl: 0 .  aspirin 81 MG chewable tablet, Chew 81 mg by mouth daily., Disp: , Rfl:  .  atorvastatin (LIPITOR) 10 MG tablet, TAKE 1 TABLET BY MOUTH  DAILY, Disp: 90 tablet, Rfl: 2 .  calcitRIOL (ROCALTROL) 0.25 MCG capsule, TAKE 1 CAPSULE BY MOUTH  EVERY MONDAY, WEDNESDAY,  AND FRIDAY., Disp: 24 capsule, Rfl: 4 .  carvedilol (COREG) 25 MG tablet, Take 1 tablet (25 mg total) by mouth 2 (two) times daily., Disp: 180 tablet, Rfl: 3 .  cetirizine (ZYRTEC) 10 MG tablet, Take 10 mg by mouth daily., Disp: , Rfl:  .  furosemide (LASIX) 40 MG tablet, Take 1 tablet (40 mg total) by mouth daily., Disp: 90 tablet, Rfl: 3 .  Olmesartan-Amlodipine-HCTZ 40-10-12.5 MG TABS, Take 1 tablet by mouth daily., Disp: 90 tablet, Rfl: 2 .  sennosides-docusate sodium (SENOKOT-S) 8.6-50 MG  tablet, Take 1 tablet by mouth 2 (two) times daily., Disp: , Rfl:   Past Medical History: Past Medical History:  Diagnosis Date  . Abdominal aortic aneurysm ([place])   . Anemia   . Anxiety   . Aortic regurgitation 12/10/08   Echo: mild to mod. AOV regurg,mild AO root dilatation, LA mildly dilated, EF 65%, LV wall thickness markedly increased, small PE  . Chronic kidney disease   . Chronic renal disease    advanced  . DVT (deep venous thrombosis) ([place])   . H/O aortic dissection 2009   infarenal abdominal aortic dissection  . History of pulmonary embolus (PE) 2010  . Hypertension    dr [name]  . Left ventricular hypertrophy   . Obesity   . Stroke [facility]) 2009   pt says no stroke    Tobacco Use: History  Smoking Status  . Never Smoker  Smokeless Tobacco  . Never Used    Labs: Recent Review Flowsheet Data    Labs for ITP Cardiac and Pulmonary Rehab Latest Ref Rng & Units 05/07/2009 02/06/2012 02/14/2012 02/29/2012 09/16/2012   Cholestrol 0 - 200 mg/dL - - - 140 -   LDLCALC 0 - 99 mg/dL - - - 84 -   HDL >39 mg/dL - - - 42 -   Trlycerides <150 mg/dL - - - 72 -   PHART 7.350 - 7.450 7.384 - - - 7.433   PCO2ART 35.0 - 45.0 mmHg 36.7 - - -  35.5   HCO3 20.0 - 24.0 mEq/L 21.8 - - - 23.3   TCO2 0 - 100 mmol/L 23 22 21  - 24.4   ACIDBASEDEF 0.0 - 2.0 mmol/L 3.0(H) - - - 0.4   O2SAT % 94.0 - - - 96.8      Capillary Blood Glucose: Lab Results  Component Value Date   GLUCAP 138 (H) 09/25/2012     Exercise Target Goals:    Exercise Program Goal: Individual exercise prescription set with THRR, safety & activity barriers. Participant demonstrates ability to understand and report RPE using BORG scale, to self-measure pulse accurately, and to acknowledge the importance of the exercise prescription.  Exercise Prescription Goal: Starting with aerobic activity 30 plus minutes a day, 3 days per week for initial exercise prescription. Provide home exercise prescription and guidelines that  participant acknowledges understanding prior to discharge.  Activity Barriers & Risk Stratification:     Activity Barriers & Cardiac Risk Stratification - 07/05/17 1432      Activity Barriers & Cardiac Risk Stratification   Activity Barriers Other (comment);Deconditioning   Cardiac Risk Stratification High      6 Minute Walk:     6 Minute Walk    Row Name 07/05/17 1034         6 Minute Walk   Phase Initial     Distance 1345 feet     Walk Time 6 minutes     # of Rest Breaks 0     MPH 2.55     METS 3.21     RPE 13     VO2 Peak 11.24     Symptoms No     Resting HR 99 bpm     Resting BP 124/64     Max Ex. HR 128 bpm     Max Ex. BP 146/80        Oxygen Initial Assessment:   Oxygen Re-Evaluation:   Oxygen Discharge (Final Oxygen Re-Evaluation):   Initial Exercise Prescription:     Initial Exercise Prescription - 07/05/17 1200      Date of Initial Exercise RX and Referring Provider   Date 07/05/17   Referring Provider Croitoru, Mihai MD     Treadmill   MPH 1.7   Grade 1   Minutes 10   METs 2     T5 Nustep   Level 3   SPM 80   Minutes 20   METs 2     Prescription Details   Frequency (times per week) 3   Duration Progress to 45 minutes of aerobic exercise without signs/symptoms of physical distress     Intensity   THRR 40-80% of Max Heartrate 69-138   Ratings of Perceived Exertion 11-13   Perceived Dyspnea 0-4     Progression   Progression Continue to progress workloads to maintain intensity without signs/symptoms of physical distress.     Resistance Training   Training Prescription Yes   Weight 3   Reps 10-15      Perform Capillary Blood Glucose checks as needed.  Exercise Prescription Changes:     Exercise Prescription Changes    Row Name 08/03/17 1400             Response to Exercise   Blood Pressure (Admit) 120/84       Blood Pressure (Exercise) 124/78       Blood Pressure (Exit) 106/76       Heart Rate (Admit) 92 bpm        Heart Rate (  Exercise) 123 bpm       Heart Rate (Exit) 84 bpm       Rating of Perceived Exertion (Exercise) 12       Duration Progress to 45 minutes of aerobic exercise without signs/symptoms of physical distress       Intensity THRR unchanged         Progression   Progression Continue to progress workloads to maintain intensity without signs/symptoms of physical distress.       Average METs 2.6         Resistance Training   Training Prescription Yes       Weight 3       Reps 10-15         Treadmill   MPH 2.2       Grade 1       Minutes 10       METs 2.99         Arm/Foot Ergometer   Level 4       Watts 45       Minutes 10       METs 1.8         T5 Nustep   Level 3       SPM 80       Minutes 20       METs 3.1         Home Exercise Plan   Plans to continue exercise at Home (comment)       Frequency Add 3 additional days to program exercise sessions.          Exercise Comments:     Exercise Comments    Row Name 08/03/17 1418           Exercise Comments Reviewed goals and home exercise with pt.           Exercise Goals and Review:     Exercise Goals    Row Name 07/05/17 0959             Exercise Goals   Increase Physical Activity Yes       Intervention Provide advice, education, support and counseling about physical activity/exercise needs.;Develop an individualized exercise prescription for aerobic and resistive training based on initial evaluation findings, risk stratification, comorbidities and participant's personal goals.       Expected Outcomes Achievement of increased cardiorespiratory fitness and enhanced flexibility, muscular endurance and strength shown through measurements of functional capacity and personal statement of participant.       Increase Strength and Stamina Yes       Intervention Provide advice, education, support and counseling about physical activity/exercise needs.;Develop an individualized exercise prescription for aerobic  and resistive training based on initial evaluation findings, risk stratification, comorbidities and participant's personal goals.       Expected Outcomes Achievement of increased cardiorespiratory fitness and enhanced flexibility, muscular endurance and strength shown through measurements of functional capacity and personal statement of participant.          Exercise Goals Re-Evaluation :     Exercise Goals Re-Evaluation    Row Name 08/03/17 1416             Exercise Goal Re-Evaluation   Exercise Goals Review Increase Physical Activity;Increase Strenth and Stamina       Comments Pt is doing well with exericse and already tolerating workload increases well.  He states he has already noticed an increase in his energy level since he began exercising  Expected Outcomes continue with exercise Rx and increase workloads as tolerated in order to increase cardiorespiratory fitness.            Discharge Exercise Prescription (Final Exercise Prescription Changes):     Exercise Prescription Changes - 08/03/17 1400      Response to Exercise   Blood Pressure (Admit) 120/84   Blood Pressure (Exercise) 124/78   Blood Pressure (Exit) 106/76   Heart Rate (Admit) 92 bpm   Heart Rate (Exercise) 123 bpm   Heart Rate (Exit) 84 bpm   Rating of Perceived Exertion (Exercise) 12   Duration Progress to 45 minutes of aerobic exercise without signs/symptoms of physical distress   Intensity THRR unchanged     Progression   Progression Continue to progress workloads to maintain intensity without signs/symptoms of physical distress.   Average METs 2.6     Resistance Training   Training Prescription Yes   Weight 3   Reps 10-15     Treadmill   MPH 2.2   Grade 1   Minutes 10   METs 2.99     Arm/Foot Ergometer   Level 4   Watts 45   Minutes 10   METs 1.8     T5 Nustep   Level 3   SPM 80   Minutes 20   METs 3.1     Home Exercise Plan   Plans to continue exercise at Home (comment)    Frequency Add 3 additional days to program exercise sessions.      Nutrition:  Target Goals: Understanding of nutrition guidelines, daily intake of sodium 1500mg , cholesterol 200mg , calories 30% from fat and 7% or less from saturated fats, daily to have 5 or more servings of fruits and vegetables.  Biometrics:     Pre Biometrics - 07/05/17 1427      Pre Biometrics   Height 6' (1.829 m)   Weight (!)  364 lb 3.2 oz (165.2 kg)   Waist Circumference 56.5 inches   Hip Circumference 69 inches   Waist to Hip Ratio 0.82 %   BMI (Calculated) 49.5   Triceps Skinfold 44 mm   % Body Fat 46.8 %   Grip Strength 38.5 kg   Flexibility 15.5 in   Single Leg Stand 12.6 seconds       Nutrition Therapy Plan and Nutrition Goals:     Nutrition Therapy & Goals - 07/05/17 1055      Nutrition Therapy   Diet Renal, Heart Healthy   Drug/Food Interactions Purine/Gout     Personal Nutrition Goals   Nutrition Goal Pt to increase self efficacy re: combining Heart Healthy and Kidney diets.   Personal Goal #2 Pt to identify food quantities necessary to achieve weight loss of 6-24 lb (2.7-10.9 kg) at graduation from cardiac rehab.     Intervention Plan   Intervention Prescribe, educate and counsel regarding individualized specific dietary modifications aiming towards targeted core components such as weight, hypertension, lipid management, diabetes, heart failure and other comorbidities.;Nutrition handout(s) given to patient.  Potassium Content of Foods, Phosphorus Content of Foods, and Purine Restricted Nutrition Therapy   Expected Outcomes Short Term Goal: Understand basic principles of dietary content, such as calories, fat, sodium, cholesterol and nutrients.;Long Term Goal: Adherence to prescribed nutrition plan.      Nutrition Discharge: Nutrition Scores:     Nutrition Assessments - 07/05/17 1106      MEDFICTS Scores   Pre Score 56      Nutrition Goals Re-Evaluation:  Nutrition  Goals Re-Evaluation:   Nutrition Goals Discharge (Final Nutrition Goals Re-Evaluation):   Psychosocial: Target Goals: Acknowledge presence or absence of significant depression and/or stress, maximize coping skills, provide positive support system. Participant is able to verbalize types and ability to use techniques and skills needed for reducing stress and depression.  Initial Review & Psychosocial Screening:     Initial Psych Review & Screening - 07/05/17 1422      Initial Review   Current issues with None Identified     Family Dynamics   Good Support System? Yes     Barriers   Psychosocial barriers to participate in program There are no identifiable barriers or psychosocial needs.;The patient should benefit from training in stress management and relaxation.     Screening Interventions   Interventions Encouraged to exercise      Quality of Life Scores:     Quality of Life - 07/05/17 1205      Quality of Life Scores   Health/Function Pre 19.11 %   Socioeconomic Pre 18 %   Psych/Spiritual Pre 24 %   Family Pre 12 %   GLOBAL Pre 19.05 %      PHQ-9: Recent Review Flowsheet Data    Depression screen [facility] 2/9 07/11/2017   Decreased Interest 0   Down, Depressed, Hopeless 0   PHQ - 2 Score 0     Interpretation of Total Score  Total Score Depression Severity:  1-4 = Minimal depression, 5-9 = Mild depression, 10-14 = Moderate depression, 15-19 = Moderately severe depression, 20-27 = Severe depression   Psychosocial Evaluation and Intervention:   Psychosocial Re-Evaluation:     Psychosocial Re-Evaluation    Row Name 08/08/17 1748             Psychosocial Re-Evaluation   Current issues with None Identified;Current Stress Concerns       Comments -  Muad's has some stressors with health of his family members       Interventions Encouraged to attend Cardiac Rehabilitation for the exercise;Relaxation education       Continue Psychosocial Services  No Follow  up required         Initial Review   Source of Stress Concerns Family          Psychosocial Discharge (Final Psychosocial Re-Evaluation):     Psychosocial Re-Evaluation - 08/08/17 1748      Psychosocial Re-Evaluation   Current issues with None Identified;Current Stress Concerns   Comments --  Kayvan's has some stressors with health of his family members   Interventions Encouraged to attend Cardiac Rehabilitation for the exercise;Relaxation education   Continue Psychosocial Services  No Follow up required     Initial Review   Source of Stress Concerns Family      Vocational Rehabilitation: Provide vocational rehab assistance to qualifying candidates.   Vocational Rehab Evaluation & Intervention:     Vocational Rehab - 07/05/17 1409      Initial Vocational Rehab Evaluation & Intervention   Assessment shows need for Vocational Rehabilitation Yes      Education: Education Goals: Education classes will be provided on a weekly basis, covering required topics. Participant will state understanding/return demonstration of topics presented.  Learning Barriers/Preferences:     Learning Barriers/Preferences - 07/05/17 1409      Learning Barriers/Preferences   Learning Barriers Sight  wears contacts   Learning Preferences Verbal Instruction;Written Material;Audio      Education Topics: Count Your Pulse:  -Group instruction provided by verbal instruction,  demonstration, patient participation and written materials to support subject.  Instructors address importance of being able to find your pulse and how to count your pulse when at home without a heart monitor.  Patients get hands on experience counting their pulse with staff help and individually.   Heart Attack, Angina, and Risk Factor Modification:  -Group instruction provided by verbal instruction, video, and written materials to support subject.  Instructors address signs and symptoms of angina and heart attacks.     Also discuss risk factors for heart disease and how to make changes to improve heart health risk factors.   Functional Fitness:  -Group instruction provided by verbal instruction, demonstration, patient participation, and written materials to support subject.  Instructors address safety measures for doing things around the house.  Discuss how to get up and down off the floor, how to pick things up properly, how to safely get out of a chair without assistance, and balance training.   CARDIAC REHAB PHASE II EXERCISE from 08/06/2017 in [place]  Date  07/13/17  Instruction Review Code  2- meets goals/outcomes      Meditation and Mindfulness:  -Group instruction provided by verbal instruction, patient participation, and written materials to support subject.  Instructor addresses importance of mindfulness and meditation practice to help reduce stress and improve awareness.  Instructor also leads participants through a meditation exercise.    Stretching for Flexibility and Mobility:  -Group instruction provided by verbal instruction, patient participation, and written materials to support subject.  Instructors lead participants through series of stretches that are designed to increase flexibility thus improving mobility.  These stretches are additional exercise for major muscle groups that are typically performed during regular warm up and cool down.   CARDIAC REHAB PHASE II EXERCISE from 08/06/2017 in [place]  Date  07/20/17  Instruction Review Code  2- meets goals/outcomes      Hands Only CPR:  -Group verbal, video, and participation provides a basic overview of AHA guidelines for community CPR. Role-play of emergencies allow participants the opportunity to practice calling for help and chest compression technique with discussion of AED use.   Hypertension: -Group verbal and written instruction that provides a basic overview  of hypertension including the most recent diagnostic guidelines, risk factor reduction with self-care instructions and medication management.    Nutrition I class: Heart Healthy Eating:  -Group instruction provided by PowerPoint slides, verbal discussion, and written materials to support subject matter. The instructor gives an explanation and review of the Therapeutic Lifestyle Changes diet recommendations, which includes a discussion on lipid goals, dietary fat, sodium, fiber, plant stanol/sterol esters, sugar, and the components of a well-balanced, healthy diet.   CARDIAC REHAB PHASE II EXERCISE from 08/06/2017 in [place]  Date  08/06/17  Educator  RD  Instruction Review Code  Not applicable      Nutrition II class: Lifestyle Skills:  -Group instruction provided by PowerPoint slides, verbal discussion, and written materials to support subject matter. The instructor gives an explanation and review of label reading, grocery shopping for heart health, heart healthy recipe modifications, and ways to make healthier choices when eating out.   CARDIAC REHAB PHASE II EXERCISE from 08/06/2017 in [place]  Date  08/06/17  Educator  RD  Instruction Review Code  Not applicable      Diabetes Question & Answer:  -Group instruction provided by PowerPoint slides, verbal  discussion, and written materials to support subject matter. The instructor gives an explanation and review of diabetes co-morbidities, pre- and post-prandial blood glucose goals, pre-exercise blood glucose goals, signs, symptoms, and treatment of hypoglycemia and hyperglycemia, and foot care basics.   Diabetes Blitz:  -Group instruction provided by PowerPoint slides, verbal discussion, and written materials to support subject matter. The instructor gives an explanation and review of the physiology behind type 1 and type 2 diabetes, diabetes medications and rational  behind using different medications, pre- and post-prandial blood glucose recommendations and Hemoglobin A1c goals, diabetes diet, and exercise including blood glucose guidelines for exercising safely.    Portion Distortion:  -Group instruction provided by PowerPoint slides, verbal discussion, written materials, and food models to support subject matter. The instructor gives an explanation of serving size versus portion size, changes in portions sizes over the last 20 years, and what consists of a serving from each food group.   Stress Management:  -Group instruction provided by verbal instruction, video, and written materials to support subject matter.  Instructors review role of stress in heart disease and how to cope with stress positively.     Exercising on Your Own:  -Group instruction provided by verbal instruction, power point, and written materials to support subject.  Instructors discuss benefits of exercise, components of exercise, frequency and intensity of exercise, and end points for exercise.  Also discuss use of nitroglycerin and activating EMS.  Review options of places to exercise outside of rehab.  Review guidelines for sex with heart disease.   CARDIAC REHAB PHASE II EXERCISE from 07/11/2017 in [place]  Date  07/11/17  Instruction Review Code  2- meets goals/outcomes      Cardiac Drugs I:  -Group instruction provided by verbal instruction and written materials to support subject.  Instructor reviews cardiac drug classes: antiplatelets, anticoagulants, beta blockers, and statins.  Instructor discusses reasons, side effects, and lifestyle considerations for each drug class.   Cardiac Drugs II:  -Group instruction provided by verbal instruction and written materials to support subject.  Instructor reviews cardiac drug classes: angiotensin converting enzyme inhibitors (ACE-I), angiotensin II receptor blockers (ARBs), nitrates, and calcium  channel blockers.  Instructor discusses reasons, side effects, and lifestyle considerations for each drug class.   Anatomy and Physiology of the Circulatory System:  Group verbal and written instruction and models provide basic cardiac anatomy and physiology, with the coronary electrical and arterial systems. Review of: AMI, Angina, Valve disease, Heart Failure, Peripheral Artery Disease, Cardiac Arrhythmia, Pacemakers, and the ICD.   CARDIAC REHAB PHASE II EXERCISE from 08/06/2017 in [place]  Date  07/25/17  Instruction Review Code  2- meets goals/outcomes      Other Education:  -Group or individual verbal, written, or video instructions that support the educational goals of the cardiac rehab program.   Knowledge Questionnaire Score:     Knowledge Questionnaire Score - 07/05/17 1006      Knowledge Questionnaire Score   Pre Score 22/24      Core Components/Risk Factors/Patient Goals at Admission:     Personal Goals and Risk Factors at Admission - 07/05/17 1408      Core Components/Risk Factors/Patient Goals on Admission    Weight Management Yes;Obesity;Weight Maintenance;Weight Loss   Intervention Weight Management: Develop a combined nutrition and exercise program designed to reach desired caloric intake, while maintaining appropriate intake of nutrient and fiber, sodium and fats, and appropriate energy expenditure required for the  weight goal.;Weight Management: Provide education and appropriate resources to help participant work on and attain dietary goals.;Weight Management/Obesity: Establish reasonable short term and long term weight goals.;Obesity: Provide education and appropriate resources to help participant work on and attain dietary goals.   Expected Outcomes Short Term: Continue to assess and modify interventions until short term weight is achieved;Long Term: Adherence to nutrition and physical activity/exercise program aimed toward  attainment of established weight goal;Weight Maintenance: Understanding of the daily nutrition guidelines, which includes 25-35% calories from fat, 7% or less cal from saturated fats, less than 200mg  cholesterol, less than 1.5gm of sodium, & 5 or more servings of fruits and vegetables daily;Weight Loss: Understanding of general recommendations for a balanced deficit meal plan, which promotes 1-2 lb weight loss per week and includes a negative energy balance of [phone] kcal/d;Understanding recommendations for meals to include 15-35% energy as protein, 25-35% energy from fat, 35-60% energy from carbohydrates, less than 200mg  of dietary cholesterol, 20-35 gm of total fiber daily;Understanding of distribution of calorie intake throughout the day with the consumption of 4-5 meals/snacks   Hypertension Yes   Intervention Provide education on lifestyle modifcations including regular physical activity/exercise, weight management, moderate sodium restriction and increased consumption of fresh fruit, vegetables, and low fat dairy, alcohol moderation, and smoking cessation.;Monitor prescription use compliance.   Expected Outcomes Short Term: Continued assessment and intervention until BP is < 140/95mm HG in hypertensive participants. < 130/15mm HG in hypertensive participants with diabetes, heart failure or chronic kidney disease.;Long Term: Maintenance of blood pressure at goal levels.   Stress Yes   Intervention Offer individual and/or small group education and counseling on adjustment to heart disease, stress management and health-related lifestyle change. Teach and support self-help strategies.;Refer participants experiencing significant psychosocial distress to appropriate mental health specialists for further evaluation and treatment. When possible, include family members and significant others in education/counseling sessions.   Expected Outcomes Short Term: Participant demonstrates changes in health-related  behavior, relaxation and other stress management skills, ability to obtain effective social support, and compliance with psychotropic medications if prescribed.;Long Term: Emotional wellbeing is indicated by absence of clinically significant psychosocial distress or social isolation.      Core Components/Risk Factors/Patient Goals Review:    Core Components/Risk Factors/Patient Goals at Discharge (Final Review):    ITP Comments:     ITP Comments    Row Name 07/05/17 0925           ITP Comments Dr. Fransico Him, Medical Director          Comments: Zailen is making expected progress toward personal goals after completing 12 sessions. Recommend continued exercise and life style modification education including  stress management and relaxation techniques to decrease cardiac risk profile. Kendon is enjoying participating in phase 2 cardiac rehab.  Gaither hopes to be able to exercise more on his non cardiac rehab days.Barnet Pall, RN,BSN 08/08/2017 6:02 PM

## 2017-08-10 ENCOUNTER — Encounter (HOSPITAL_COMMUNITY): Payer: Medicare Other

## 2017-08-10 ENCOUNTER — Encounter (HOSPITAL_COMMUNITY)
Admission: RE | Admit: 2017-08-10 | Discharge: 2017-08-10 | Disposition: A | Discharge status: Home / Self Care | Payer: Medicare Other | Source: Ambulatory Visit | Attending: Cardiovascular Disease | Admitting: Cardiovascular Disease

## 2017-08-10 DIAGNOSIS — Z9889 Other specified postprocedural states: Secondary | ICD-10-CM

## 2017-08-10 DIAGNOSIS — N189 Chronic kidney disease, unspecified: Secondary | ICD-10-CM | POA: Diagnosis not present

## 2017-08-10 DIAGNOSIS — Z952 Presence of prosthetic heart valve: Secondary | ICD-10-CM | POA: Diagnosis not present

## 2017-08-10 DIAGNOSIS — Z79899 Other long term (current) drug therapy: Secondary | ICD-10-CM | POA: Diagnosis not present

## 2017-08-10 DIAGNOSIS — I719 Aortic aneurysm of unspecified site, without rupture: Secondary | ICD-10-CM | POA: Diagnosis not present

## 2017-08-10 DIAGNOSIS — Z8673 Personal history of transient ischemic attack (TIA), and cerebral infarction without residual deficits: Secondary | ICD-10-CM | POA: Diagnosis not present

## 2017-08-10 DIAGNOSIS — Z8679 Personal history of other diseases of the circulatory system: Secondary | ICD-10-CM | POA: Diagnosis not present

## 2017-08-10 DIAGNOSIS — I129 Hypertensive chronic kidney disease with stage 1 through stage 4 chronic kidney disease, or unspecified chronic kidney disease: Secondary | ICD-10-CM | POA: Diagnosis not present

## 2017-08-13 ENCOUNTER — Encounter (HOSPITAL_COMMUNITY)
Admission: RE | Admit: 2017-08-13 | Discharge: 2017-08-13 | Disposition: A | Discharge status: Home / Self Care | Payer: Medicare Other | Source: Ambulatory Visit | Attending: Cardiovascular Disease | Admitting: Cardiovascular Disease

## 2017-08-13 ENCOUNTER — Encounter (HOSPITAL_COMMUNITY): Payer: Medicare Other

## 2017-08-13 DIAGNOSIS — I129 Hypertensive chronic kidney disease with stage 1 through stage 4 chronic kidney disease, or unspecified chronic kidney disease: Secondary | ICD-10-CM | POA: Diagnosis not present

## 2017-08-13 DIAGNOSIS — Z8679 Personal history of other diseases of the circulatory system: Secondary | ICD-10-CM | POA: Diagnosis not present

## 2017-08-13 DIAGNOSIS — Z8673 Personal history of transient ischemic attack (TIA), and cerebral infarction without residual deficits: Secondary | ICD-10-CM | POA: Diagnosis not present

## 2017-08-13 DIAGNOSIS — Z79899 Other long term (current) drug therapy: Secondary | ICD-10-CM | POA: Diagnosis not present

## 2017-08-13 DIAGNOSIS — Z9889 Other specified postprocedural states: Secondary | ICD-10-CM | POA: Diagnosis not present

## 2017-08-13 DIAGNOSIS — N189 Chronic kidney disease, unspecified: Secondary | ICD-10-CM | POA: Diagnosis not present

## 2017-08-13 DIAGNOSIS — Z952 Presence of prosthetic heart valve: Secondary | ICD-10-CM | POA: Diagnosis not present

## 2017-08-13 DIAGNOSIS — I719 Aortic aneurysm of unspecified site, without rupture: Secondary | ICD-10-CM | POA: Diagnosis not present

## 2017-08-14 ENCOUNTER — Telehealth: Payer: Self-pay | Admitting: Cardiovascular Disease

## 2017-08-14 ENCOUNTER — Telehealth: Payer: Self-pay | Admitting: Internal Medicine

## 2017-08-14 ENCOUNTER — Other Ambulatory Visit: Payer: Self-pay | Admitting: Cardiovascular Disease

## 2017-08-14 NOTE — Telephone Encounter (Signed)
Pt would like to see if Dr. Raliegh Ip has any samples of Eliquis

## 2017-08-14 NOTE — Telephone Encounter (Signed)
New message      Returning a call to the nurse. Not sure who called

## 2017-08-14 NOTE — Telephone Encounter (Signed)
Two boxes of to Eliquis samples placed at front desk for pick up. Called to inform pt with no answer, msg left to call back.

## 2017-08-14 NOTE — Progress Notes (Signed)
QUALITY OF LIFE SCORE REVIEW  Pt completed Quality of Life survey as a participant in Cardiac Rehab. Scores 21.0 or below are considered low. Pt score very low in several areas Overall 19.05, Health and Function 19.11, socioeconomic 18.00, physiological and spiritual 24.00, family 12.0. Patient quality of life slightly altered by physical constraints which limits ability to perform as prior to recent cardiac illness. [name] scored low in all areas of his quality of life questionnaire except psychosocial spiritual.  Offered emotional support and reassurance.  Will continue to monitor and intervene as necessary.  [name] has been enjoying participating in phase 2 cardiac rehab and says he feels stronger. [name] told me he has little support from his family beside his wife who has health issues of her own. [name] does have spiritual support from his church. Will forward today's quality of life questionnaire to Dr [employer] office for review with exercise flow sheets from cardiac rehab.Barnet Pall, RN,BSN 08/14/2017 4:41 PM

## 2017-08-14 NOTE — Telephone Encounter (Signed)
Patient notified that samples are at the front desk and voiced understanding.

## 2017-08-14 NOTE — Telephone Encounter (Signed)
Yes we have samples of  5mg  yet pts is on 2.5mg  of Eliquis. We also have discount cards.

## 2017-08-14 NOTE — Telephone Encounter (Signed)
°  Patient calling the office for samples of medication:   1.  What medication and dosage are you requesting samples for? Eliquis 2.5mg     2.  Are you currently out of this medication? Almost

## 2017-08-14 NOTE — Telephone Encounter (Signed)
Called patient. No answer. Left message to call back.  

## 2017-08-15 ENCOUNTER — Encounter (HOSPITAL_COMMUNITY): Payer: Medicare Other

## 2017-08-15 ENCOUNTER — Encounter (HOSPITAL_COMMUNITY)
Admission: RE | Admit: 2017-08-15 | Discharge: 2017-08-15 | Disposition: A | Discharge status: Home / Self Care | Payer: Medicare Other | Source: Ambulatory Visit | Attending: Cardiovascular Disease | Admitting: Cardiovascular Disease

## 2017-08-15 DIAGNOSIS — Z8673 Personal history of transient ischemic attack (TIA), and cerebral infarction without residual deficits: Secondary | ICD-10-CM | POA: Diagnosis not present

## 2017-08-15 DIAGNOSIS — Z9889 Other specified postprocedural states: Secondary | ICD-10-CM | POA: Diagnosis not present

## 2017-08-15 DIAGNOSIS — I129 Hypertensive chronic kidney disease with stage 1 through stage 4 chronic kidney disease, or unspecified chronic kidney disease: Secondary | ICD-10-CM | POA: Diagnosis not present

## 2017-08-15 DIAGNOSIS — Z952 Presence of prosthetic heart valve: Secondary | ICD-10-CM | POA: Diagnosis not present

## 2017-08-15 DIAGNOSIS — N189 Chronic kidney disease, unspecified: Secondary | ICD-10-CM | POA: Diagnosis not present

## 2017-08-15 DIAGNOSIS — I719 Aortic aneurysm of unspecified site, without rupture: Secondary | ICD-10-CM | POA: Diagnosis not present

## 2017-08-15 DIAGNOSIS — Z8679 Personal history of other diseases of the circulatory system: Secondary | ICD-10-CM | POA: Diagnosis not present

## 2017-08-15 DIAGNOSIS — Z79899 Other long term (current) drug therapy: Secondary | ICD-10-CM | POA: Diagnosis not present

## 2017-08-15 NOTE — Telephone Encounter (Signed)
lmom to call the office back

## 2017-08-16 ENCOUNTER — Telehealth: Payer: Self-pay

## 2017-08-16 NOTE — Telephone Encounter (Signed)
Pt was at the office and requested for samples for Eliquis, patient is on 2.5 mg twice daily, The office had 2 boxes of 5 mg with 14 tablets each, pt was educated on taking half tablet which is 2.5 mg in the morning and night. The samples were documented in pt chart by Rosealee Albee CMA.

## 2017-08-16 NOTE — Telephone Encounter (Signed)
Medication Samples have been provided to the patient.  Drug name:Eliquis   Strength: 5 mg        Qty: 24  LOT: [id number]  Exp.Date: 11/2019  Dosing instructions:Take 1/2 ( 2.5 mg twice daily)   The patient has been instructed regarding the correct time, dose, and frequency of taking this medication, including desired effects and most common side effects.   Andee Poles [place] 4:47 PM 08/16/2017

## 2017-08-17 ENCOUNTER — Encounter (HOSPITAL_COMMUNITY): Payer: Medicare Other

## 2017-08-17 ENCOUNTER — Telehealth (HOSPITAL_COMMUNITY): Payer: Self-pay | Admitting: Internal Medicine

## 2017-08-20 ENCOUNTER — Encounter (HOSPITAL_COMMUNITY): Payer: Medicare Other

## 2017-08-20 NOTE — Telephone Encounter (Signed)
lmom to call the office back

## 2017-08-22 ENCOUNTER — Encounter (HOSPITAL_COMMUNITY)
Admission: RE | Admit: 2017-08-22 | Discharge: 2017-08-22 | Disposition: A | Discharge status: Home / Self Care | Payer: Medicare Other | Source: Ambulatory Visit | Attending: Cardiovascular Disease | Admitting: Cardiovascular Disease

## 2017-08-22 ENCOUNTER — Encounter (HOSPITAL_COMMUNITY): Payer: Medicare Other

## 2017-08-22 DIAGNOSIS — I129 Hypertensive chronic kidney disease with stage 1 through stage 4 chronic kidney disease, or unspecified chronic kidney disease: Secondary | ICD-10-CM | POA: Diagnosis not present

## 2017-08-22 DIAGNOSIS — Z8673 Personal history of transient ischemic attack (TIA), and cerebral infarction without residual deficits: Secondary | ICD-10-CM | POA: Diagnosis not present

## 2017-08-22 DIAGNOSIS — Z9889 Other specified postprocedural states: Secondary | ICD-10-CM

## 2017-08-22 DIAGNOSIS — N189 Chronic kidney disease, unspecified: Secondary | ICD-10-CM | POA: Diagnosis not present

## 2017-08-22 DIAGNOSIS — I719 Aortic aneurysm of unspecified site, without rupture: Secondary | ICD-10-CM | POA: Diagnosis not present

## 2017-08-22 DIAGNOSIS — Z8679 Personal history of other diseases of the circulatory system: Secondary | ICD-10-CM | POA: Diagnosis not present

## 2017-08-22 DIAGNOSIS — Z79899 Other long term (current) drug therapy: Secondary | ICD-10-CM | POA: Diagnosis not present

## 2017-08-22 DIAGNOSIS — Z952 Presence of prosthetic heart valve: Secondary | ICD-10-CM | POA: Diagnosis not present

## 2017-08-24 ENCOUNTER — Encounter (HOSPITAL_COMMUNITY)
Admission: RE | Admit: 2017-08-24 | Discharge: 2017-08-24 | Disposition: A | Discharge status: Home / Self Care | Payer: Medicare Other | Source: Ambulatory Visit | Attending: Cardiovascular Disease | Admitting: Cardiovascular Disease

## 2017-08-24 ENCOUNTER — Encounter (HOSPITAL_COMMUNITY): Payer: Medicare Other

## 2017-08-24 DIAGNOSIS — Z952 Presence of prosthetic heart valve: Secondary | ICD-10-CM | POA: Diagnosis not present

## 2017-08-24 DIAGNOSIS — Z8673 Personal history of transient ischemic attack (TIA), and cerebral infarction without residual deficits: Secondary | ICD-10-CM | POA: Diagnosis not present

## 2017-08-24 DIAGNOSIS — N189 Chronic kidney disease, unspecified: Secondary | ICD-10-CM | POA: Diagnosis not present

## 2017-08-24 DIAGNOSIS — Z79899 Other long term (current) drug therapy: Secondary | ICD-10-CM | POA: Diagnosis not present

## 2017-08-24 DIAGNOSIS — I129 Hypertensive chronic kidney disease with stage 1 through stage 4 chronic kidney disease, or unspecified chronic kidney disease: Secondary | ICD-10-CM | POA: Diagnosis not present

## 2017-08-24 DIAGNOSIS — Z9889 Other specified postprocedural states: Secondary | ICD-10-CM | POA: Diagnosis not present

## 2017-08-24 DIAGNOSIS — Z8679 Personal history of other diseases of the circulatory system: Secondary | ICD-10-CM | POA: Diagnosis not present

## 2017-08-24 DIAGNOSIS — I719 Aortic aneurysm of unspecified site, without rupture: Secondary | ICD-10-CM | POA: Diagnosis not present

## 2017-08-25 ENCOUNTER — Encounter: Payer: Self-pay | Admitting: Internal Medicine

## 2017-08-25 ENCOUNTER — Other Ambulatory Visit: Payer: Self-pay | Admitting: Cardiovascular Disease

## 2017-08-28 NOTE — Telephone Encounter (Signed)
REFILL 

## 2017-08-29 ENCOUNTER — Encounter (HOSPITAL_COMMUNITY)
Admission: RE | Admit: 2017-08-29 | Discharge: 2017-08-29 | Disposition: A | Discharge status: Home / Self Care | Payer: Medicare Other | Source: Ambulatory Visit | Attending: Cardiovascular Disease | Admitting: Cardiovascular Disease

## 2017-08-29 ENCOUNTER — Encounter (HOSPITAL_COMMUNITY): Payer: Medicare Other

## 2017-08-29 DIAGNOSIS — N189 Chronic kidney disease, unspecified: Secondary | ICD-10-CM | POA: Diagnosis not present

## 2017-08-29 DIAGNOSIS — Z8679 Personal history of other diseases of the circulatory system: Secondary | ICD-10-CM | POA: Diagnosis not present

## 2017-08-29 DIAGNOSIS — Z9889 Other specified postprocedural states: Secondary | ICD-10-CM | POA: Diagnosis not present

## 2017-08-29 DIAGNOSIS — I129 Hypertensive chronic kidney disease with stage 1 through stage 4 chronic kidney disease, or unspecified chronic kidney disease: Secondary | ICD-10-CM | POA: Diagnosis not present

## 2017-08-29 DIAGNOSIS — Z952 Presence of prosthetic heart valve: Secondary | ICD-10-CM | POA: Diagnosis not present

## 2017-08-29 DIAGNOSIS — I719 Aortic aneurysm of unspecified site, without rupture: Secondary | ICD-10-CM | POA: Diagnosis not present

## 2017-08-29 DIAGNOSIS — Z79899 Other long term (current) drug therapy: Secondary | ICD-10-CM | POA: Diagnosis not present

## 2017-08-29 DIAGNOSIS — Z8673 Personal history of transient ischemic attack (TIA), and cerebral infarction without residual deficits: Secondary | ICD-10-CM | POA: Insufficient documentation

## 2017-08-31 ENCOUNTER — Encounter (HOSPITAL_COMMUNITY): Payer: Medicare Other

## 2017-08-31 ENCOUNTER — Telehealth: Payer: Self-pay

## 2017-08-31 NOTE — Telephone Encounter (Signed)
Medication Samples have been provided to the patient.  Drug name: Eliquis      Strength: 5 mg       Qty: 2 boxes of 14 tablets each  LOT: [id number]  Exp.Date:11/2019  Dosing instructions:Take (2.5mg  total ) two times  Daily.  The patient has been instructed regarding the correct time, dose, and frequency of taking this medication, including desired effects and most common side effects.   Andee Poles [place] 3:22 PM 08/31/2017

## 2017-09-03 ENCOUNTER — Encounter (HOSPITAL_COMMUNITY): Payer: Medicare Other

## 2017-09-05 ENCOUNTER — Encounter (HOSPITAL_COMMUNITY): Payer: Medicare Other

## 2017-09-06 ENCOUNTER — Encounter (HOSPITAL_COMMUNITY): Payer: Self-pay | Admitting: *Deleted

## 2017-09-06 DIAGNOSIS — Z9889 Other specified postprocedural states: Secondary | ICD-10-CM

## 2017-09-06 NOTE — Progress Notes (Signed)
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  acetaminophen (TYLENOL) 500 MG tablet, Take 1,000 mg by mouth every 6 (six) hours as needed for moderate pain or headache. Reported on 12/14/2015, Disp: , Rfl:  .  allopurinol (ZYLOPRIM) 300 MG tablet, TAKE 1 TABLET BY MOUTH  DAILY (Patient taking differently: TAKE 1/2 TABLET BY MOUTH  DAILY), Disp: 90 tablet, Rfl: 1 .  apixaban (ELIQUIS) 2.5 MG TABS tablet, Take 1 tablet (2.5 mg total) by mouth 2 (two) times daily., Disp: 28 tablet, Rfl: 0 .  aspirin 81 MG chewable tablet, Chew 81 mg by mouth daily., Disp: , Rfl:  .  atorvastatin (LIPITOR) 10 MG tablet, TAKE 1 TABLET BY MOUTH  DAILY, Disp: 90 tablet, Rfl: 2 .  calcitRIOL (ROCALTROL) 0.25 MCG capsule, TAKE 1 CAPSULE BY MOUTH  EVERY MONDAY, WEDNESDAY,  AND FRIDAY., Disp: 24 capsule, Rfl: 4 .  carvedilol (COREG) 25 MG tablet, TAKE 1 TABLET BY MOUTH TWO  TIMES DAILY, Disp: 180 tablet, Rfl: 1 .  cetirizine (ZYRTEC) 10 MG tablet, Take 10 mg by mouth daily., Disp: , Rfl:  .  furosemide (LASIX) 40 MG tablet, Take 1 tablet (40 mg total) by mouth daily., Disp: 90 tablet, Rfl: 3 .  Olmesartan-Amlodipine-HCTZ 40-10-12.5 MG TABS, TAKE 1 TABLET BY MOUTH  DAILY, Disp: 90 tablet, Rfl: 1 .  sennosides-docusate sodium (SENOKOT-S) 8.6-50 MG tablet, Take 1 tablet  by mouth 2 (two) times daily., Disp: , Rfl:   Past Medical History: Past Medical History:  Diagnosis Date  . Abdominal aortic aneurysm ([place])   . Anemia   . Anxiety   . Aortic regurgitation 12/10/08   Echo: mild to mod. AOV regurg,mild AO root dilatation, LA mildly dilated, EF 65%, LV wall thickness markedly increased, small PE  . Chronic kidney disease   . Chronic renal disease    advanced  . DVT (deep venous thrombosis) ([place])   . H/O aortic dissection 2009   infarenal abdominal aortic dissection  . History of pulmonary embolus (PE) 2010  . Hypertension    dr [name]  . Left ventricular hypertrophy   . Obesity   . Stroke [facility]) 2009   pt says no stroke    Tobacco Use: History  Smoking Status  . Never Smoker  Smokeless Tobacco  . Never Used    Labs: Recent Review Flowsheet Data    Labs for ITP Cardiac and Pulmonary Rehab Latest Ref Rng & Units 05/07/2009 02/06/2012 02/14/2012 02/29/2012 09/16/2012   Cholestrol 0 - 200 mg/dL - - - 140 -   LDLCALC 0 - 99 mg/dL - - - 84 -   HDL >39 mg/dL - - - 42 -   Trlycerides <150 mg/dL - - - 72 -   PHART 7.350 - 7.450 7.384 - - - 7.433   PCO2ART 35.0 - 45.0 mmHg 36.7 - - -  35.5   HCO3 20.0 - 24.0 mEq/L 21.8 - - - 23.3   TCO2 0 - 100 mmol/L 23 22 21  - 24.4   ACIDBASEDEF 0.0 - 2.0 mmol/L 3.0(H) - - - 0.4   O2SAT % 94.0 - - - 96.8      Capillary Blood Glucose: Lab Results  Component Value Date   GLUCAP 138 (H) 09/25/2012     Exercise Target Goals:    Exercise Program Goal: Individual exercise prescription set with THRR, safety & activity barriers. Participant demonstrates ability to understand and report RPE using BORG scale, to self-measure pulse accurately, and to acknowledge the importance of the exercise prescription.  Exercise Prescription Goal: Starting with aerobic activity 30 plus minutes a day, 3 days per week for initial exercise prescription. Provide home exercise prescription and guidelines that participant acknowledges  understanding prior to discharge.  Activity Barriers & Risk Stratification:   6 Minute Walk:   Oxygen Initial Assessment:   Oxygen Re-Evaluation:   Oxygen Discharge (Final Oxygen Re-Evaluation):   Initial Exercise Prescription:   Perform Capillary Blood Glucose checks as needed.  Exercise Prescription Changes:     Exercise Prescription Changes    Row Name 08/03/17 1400 09/04/17 1100           Response to Exercise   Blood Pressure (Admit) 120/84 108/82      Blood Pressure (Exercise) 124/78 132/84      Blood Pressure (Exit) 106/76 100/64      Heart Rate (Admit) 92 bpm 89 bpm      Heart Rate (Exercise) 123 bpm 115 bpm      Heart Rate (Exit) 84 bpm 82 bpm      Rating of Perceived Exertion (Exercise) 12 13      Symptoms  - none      Duration Progress to 45 minutes of aerobic exercise without signs/symptoms of physical distress Progress to 45 minutes of aerobic exercise without signs/symptoms of physical distress      Intensity THRR unchanged THRR unchanged        Progression   Progression Continue to progress workloads to maintain intensity without signs/symptoms of physical distress. Continue to progress workloads to maintain intensity without signs/symptoms of physical distress.      Average METs 2.6 2.8        Resistance Training   Training Prescription Yes Yes      Weight 3 3      Reps 10-15 10-15      Time  - 10 Minutes        Interval Training   Interval Training  - No        Treadmill   MPH 2.2 2.4      Grade 1 2      Minutes 10 10      METs 2.99 3.5        Arm/Foot Ergometer   Level 4 4      Watts 45  -      Minutes 10 10      METs 1.8 2.1        T5 Nustep   Level 3 5      SPM 80 80      Minutes 20 20      METs 3.1 2.8        Home Exercise Plan   Plans to continue exercise at Home (comment) Home (comment)      Frequency Add 3 additional days to program exercise sessions. Add 3 additional days  to program exercise sessions.      Initial  Home Exercises Provided  - 07/25/17         Exercise Comments:     Exercise Comments    Row Name 08/03/17 1418 09/05/17 1458         Exercise Comments Reviewed goals and home exercise with pt.  Making steady progress with exercise.         Exercise Goals and Review:   Exercise Goals Re-Evaluation :     Exercise Goals Re-Evaluation    Row Name 08/03/17 1416 09/05/17 1458           Exercise Goal Re-Evaluation   Exercise Goals Review Increase Physical Activity;Increase Strenth and Stamina Increase Physical Activity;Increase Strength and Stamina      Comments Pt is doing well with exericse and already tolerating workload increases well.  He states he has already noticed an increase in his energy level since he began exercising Pt is making steady progress with exercise. Increasing workloads as tolerated.      Expected Outcomes continue with exercise Rx and increase workloads as tolerated in order to increase cardiorespiratory fitness.  Continue exercise routine to help achieve fitness and weight loss goals.          Discharge Exercise Prescription (Final Exercise Prescription Changes):     Exercise Prescription Changes - 09/04/17 1100      Response to Exercise   Blood Pressure (Admit) 108/82   Blood Pressure (Exercise) 132/84   Blood Pressure (Exit) 100/64   Heart Rate (Admit) 89 bpm   Heart Rate (Exercise) 115 bpm   Heart Rate (Exit) 82 bpm   Rating of Perceived Exertion (Exercise) 13   Symptoms none   Duration Progress to 45 minutes of aerobic exercise without signs/symptoms of physical distress   Intensity THRR unchanged     Progression   Progression Continue to progress workloads to maintain intensity without signs/symptoms of physical distress.   Average METs 2.8     Resistance Training   Training Prescription Yes   Weight 3   Reps 10-15   Time 10 Minutes     Interval Training   Interval Training No     Treadmill   MPH 2.4   Grade 2   Minutes 10    METs 3.5     Arm/Foot Ergometer   Level 4   Minutes 10   METs 2.1     T5 Nustep   Level 5   SPM 80   Minutes 20   METs 2.8     Home Exercise Plan   Plans to continue exercise at Home (comment)   Frequency Add 3 additional days to program exercise sessions.   Initial Home Exercises Provided 07/25/17      Nutrition:  Target Goals: Understanding of nutrition guidelines, daily intake of sodium 1500mg , cholesterol 200mg , calories 30% from fat and 7% or less from saturated fats, daily to have 5 or more servings of fruits and vegetables.  Biometrics:    Nutrition Therapy Plan and Nutrition Goals:   Nutrition Discharge: Nutrition Scores:   Nutrition Goals Re-Evaluation:   Nutrition Goals Re-Evaluation:   Nutrition Goals Discharge (Final Nutrition Goals Re-Evaluation):   Psychosocial: Target Goals: Acknowledge presence or absence of significant depression and/or stress, maximize coping skills, provide positive support system. Participant is able to verbalize types and ability to use techniques and skills needed for reducing stress and depression.  Initial Review & Psychosocial Screening:   Quality of Life Scores:  PHQ-9: Recent Review Flowsheet Data    Depression screen [facility] 2/9 07/11/2017   Decreased Interest 0   Down, Depressed, Hopeless 0   PHQ - 2 Score 0     Interpretation of Total Score  Total Score Depression Severity:  1-4 = Minimal depression, 5-9 = Mild depression, 10-14 = Moderate depression, 15-19 = Moderately severe depression, 20-27 = Severe depression   Psychosocial Evaluation and Intervention:   Psychosocial Re-Evaluation:     Psychosocial Re-Evaluation    [place] Name 08/08/17 1748 09/06/17 1022           Psychosocial Re-Evaluation   Current issues with None Identified;Current Stress Concerns Current Stress Concerns      Comments -  Kristof's has some stressors with health of his family members -  Fabiola Backer continues to assist in  the care of his wife when needed      Interventions Encouraged to attend Cardiac Rehabilitation for the exercise;Relaxation education Encouraged to attend Cardiac Rehabilitation for the exercise;Relaxation education      Continue Psychosocial Services  No Follow up required No Follow up required        Initial Review   Source of Stress Concerns Family Family         Psychosocial Discharge (Final Psychosocial Re-Evaluation):     Psychosocial Re-Evaluation - 09/06/17 1022      Psychosocial Re-Evaluation   Current issues with Current Stress Concerns   Comments --  Fabiola Backer continues to assist in the care of his wife when needed   Interventions Encouraged to attend Cardiac Rehabilitation for the exercise;Relaxation education   Continue Psychosocial Services  No Follow up required     Initial Review   Source of Stress Concerns Family      Vocational Rehabilitation: Provide vocational rehab assistance to qualifying candidates.   Vocational Rehab Evaluation & Intervention:   Education: Education Goals: Education classes will be provided on a weekly basis, covering required topics. Participant will state understanding/return demonstration of topics presented.  Learning Barriers/Preferences:   Education Topics: Count Your Pulse:  -Group instruction provided by verbal instruction, demonstration, patient participation and written materials to support subject.  Instructors address importance of being able to find your pulse and how to count your pulse when at home without a heart monitor.  Patients get hands on experience counting their pulse with staff help and individually.   Heart Attack, Angina, and Risk Factor Modification:  -Group instruction provided by verbal instruction, video, and written materials to support subject.  Instructors address signs and symptoms of angina and heart attacks.    Also discuss risk factors for heart disease and how to make changes to improve heart  health risk factors.   Functional Fitness:  -Group instruction provided by verbal instruction, demonstration, patient participation, and written materials to support subject.  Instructors address safety measures for doing things around the house.  Discuss how to get up and down off the floor, how to pick things up properly, how to safely get out of a chair without assistance, and balance training.   CARDIAC REHAB PHASE II EXERCISE from 08/29/2017 in [place]  Date  07/13/17  Instruction Review Code  2- meets goals/outcomes      Meditation and Mindfulness:  -Group instruction provided by verbal instruction, patient participation, and written materials to support subject.  Instructor addresses importance of mindfulness and meditation practice to help reduce stress and improve awareness.  Instructor also leads participants through a meditation exercise.  Stretching for Flexibility and Mobility:  -Group instruction provided by verbal instruction, patient participation, and written materials to support subject.  Instructors lead participants through series of stretches that are designed to increase flexibility thus improving mobility.  These stretches are additional exercise for major muscle groups that are typically performed during regular warm up and cool down.   CARDIAC REHAB PHASE II EXERCISE from 08/29/2017 in [place]  Date  07/20/17  Instruction Review Code  2- meets goals/outcomes      Hands Only CPR:  -Group verbal, video, and participation provides a basic overview of AHA guidelines for community CPR. Role-play of emergencies allow participants the opportunity to practice calling for help and chest compression technique with discussion of AED use.   Hypertension: -Group verbal and written instruction that provides a basic overview of hypertension including the most recent diagnostic guidelines, risk factor reduction  with self-care instructions and medication management.    Nutrition I class: Heart Healthy Eating:  -Group instruction provided by PowerPoint slides, verbal discussion, and written materials to support subject matter. The instructor gives an explanation and review of the Therapeutic Lifestyle Changes diet recommendations, which includes a discussion on lipid goals, dietary fat, sodium, fiber, plant stanol/sterol esters, sugar, and the components of a well-balanced, healthy diet.   CARDIAC REHAB PHASE II EXERCISE from 08/29/2017 in [place]  Date  08/06/17  Educator  RD  Instruction Review Code  Not applicable      Nutrition II class: Lifestyle Skills:  -Group instruction provided by PowerPoint slides, verbal discussion, and written materials to support subject matter. The instructor gives an explanation and review of label reading, grocery shopping for heart health, heart healthy recipe modifications, and ways to make healthier choices when eating out.   CARDIAC REHAB PHASE II EXERCISE from 08/29/2017 in [place]  Date  08/06/17  Educator  RD  Instruction Review Code  Not applicable      Diabetes Question & Answer:  -Group instruction provided by PowerPoint slides, verbal discussion, and written materials to support subject matter. The instructor gives an explanation and review of diabetes co-morbidities, pre- and post-prandial blood glucose goals, pre-exercise blood glucose goals, signs, symptoms, and treatment of hypoglycemia and hyperglycemia, and foot care basics.   Diabetes Blitz:  -Group instruction provided by PowerPoint slides, verbal discussion, and written materials to support subject matter. The instructor gives an explanation and review of the physiology behind type 1 and type 2 diabetes, diabetes medications and rational behind using different medications, pre- and post-prandial blood glucose recommendations and  Hemoglobin A1c goals, diabetes diet, and exercise including blood glucose guidelines for exercising safely.    Portion Distortion:  -Group instruction provided by PowerPoint slides, verbal discussion, written materials, and food models to support subject matter. The instructor gives an explanation of serving size versus portion size, changes in portions sizes over the last 20 years, and what consists of a serving from each food group.   CARDIAC REHAB PHASE II EXERCISE from 08/29/2017 in [place]  Date  08/15/17  Educator  RD  Instruction Review Code  2- meets goals/outcomes      Stress Management:  -Group instruction provided by verbal instruction, video, and written materials to support subject matter.  Instructors review role of stress in heart disease and how to cope with stress positively.     Exercising on Your Own:  -Group instruction provided by  verbal instruction, power point, and written materials to support subject.  Instructors discuss benefits of exercise, components of exercise, frequency and intensity of exercise, and end points for exercise.  Also discuss use of nitroglycerin and activating EMS.  Review options of places to exercise outside of rehab.  Review guidelines for sex with heart disease.   CARDIAC REHAB PHASE II EXERCISE from 08/29/2017 in [place]  Date  08/29/17  Instruction Review Code  2- meets goals/outcomes      Cardiac Drugs I:  -Group instruction provided by verbal instruction and written materials to support subject.  Instructor reviews cardiac drug classes: antiplatelets, anticoagulants, beta blockers, and statins.  Instructor discusses reasons, side effects, and lifestyle considerations for each drug class.   Cardiac Drugs II:  -Group instruction provided by verbal instruction and written materials to support subject.  Instructor reviews cardiac drug classes: angiotensin converting enzyme  inhibitors (ACE-I), angiotensin II receptor blockers (ARBs), nitrates, and calcium channel blockers.  Instructor discusses reasons, side effects, and lifestyle considerations for each drug class.   Anatomy and Physiology of the Circulatory System:  Group verbal and written instruction and models provide basic cardiac anatomy and physiology, with the coronary electrical and arterial systems. Review of: AMI, Angina, Valve disease, Heart Failure, Peripheral Artery Disease, Cardiac Arrhythmia, Pacemakers, and the ICD.   CARDIAC REHAB PHASE II EXERCISE from 08/29/2017 in [place]  Date  07/25/17  Instruction Review Code  2- meets goals/outcomes      Other Education:  -Group or individual verbal, written, or video instructions that support the educational goals of the cardiac rehab program.   Knowledge Questionnaire Score:   Core Components/Risk Factors/Patient Goals at Admission:   Core Components/Risk Factors/Patient Goals Review:      Goals and Risk Factor Review    Row Name 09/06/17 1018 09/06/17 1019           Core Components/Risk Factors/Patient Goals Review   Personal Goals Review Weight Management/Obesity Hypertension;Stress;Weight Management/Obesity      Review  - Trayquan's vital signs have been stable at exercise. Cortavious has maintianed a steady weight.      Expected Outcomes  - Nicolo will continue to attend cardiac rehab for health benifits. Will continue to offer emotional supoort for stress managment.         Core Components/Risk Factors/Patient Goals at Discharge (Final Review):      Goals and Risk Factor Review - 09/06/17 1019      Core Components/Risk Factors/Patient Goals Review   Personal Goals Review Hypertension;Stress;Weight Management/Obesity   Review Beren's vital signs have been stable at exercise. Santhosh has maintianed a steady weight.   Expected Outcomes Kaylum will continue to attend cardiac rehab for health  benifits. Will continue to offer emotional supoort for stress managment.      ITP Comments:   Comments: Mackie is making expected progress toward personal goals after completing 19 sessions. Recommend continued exercise and life style modification education including  stress management and relaxation techniques to decrease cardiac risk profile. Nehemias is out this week to take care of his wife.Barnet Pall, RN,BSN 09/06/2017 10:33 AM

## 2017-09-07 ENCOUNTER — Encounter (HOSPITAL_COMMUNITY): Payer: Medicare Other

## 2017-09-10 ENCOUNTER — Encounter (HOSPITAL_COMMUNITY): Payer: Medicare Other

## 2017-09-12 ENCOUNTER — Encounter (HOSPITAL_COMMUNITY): Payer: Medicare Other

## 2017-09-13 ENCOUNTER — Encounter: Payer: Self-pay | Admitting: Internal Medicine

## 2017-09-14 ENCOUNTER — Encounter (HOSPITAL_COMMUNITY): Payer: Medicare Other

## 2017-09-17 ENCOUNTER — Encounter (HOSPITAL_COMMUNITY): Payer: Medicare Other

## 2017-09-17 ENCOUNTER — Encounter (HOSPITAL_COMMUNITY)
Admission: RE | Admit: 2017-09-17 | Discharge: 2017-09-17 | Disposition: A | Discharge status: Home / Self Care | Payer: Medicare Other | Source: Ambulatory Visit | Attending: Cardiovascular Disease | Admitting: Cardiovascular Disease

## 2017-09-17 DIAGNOSIS — N189 Chronic kidney disease, unspecified: Secondary | ICD-10-CM | POA: Diagnosis not present

## 2017-09-17 DIAGNOSIS — Z9889 Other specified postprocedural states: Secondary | ICD-10-CM | POA: Diagnosis not present

## 2017-09-17 DIAGNOSIS — Z79899 Other long term (current) drug therapy: Secondary | ICD-10-CM | POA: Diagnosis not present

## 2017-09-17 DIAGNOSIS — I129 Hypertensive chronic kidney disease with stage 1 through stage 4 chronic kidney disease, or unspecified chronic kidney disease: Secondary | ICD-10-CM | POA: Diagnosis not present

## 2017-09-17 DIAGNOSIS — Z8673 Personal history of transient ischemic attack (TIA), and cerebral infarction without residual deficits: Secondary | ICD-10-CM | POA: Diagnosis not present

## 2017-09-17 DIAGNOSIS — Z8679 Personal history of other diseases of the circulatory system: Secondary | ICD-10-CM | POA: Diagnosis not present

## 2017-09-17 DIAGNOSIS — I719 Aortic aneurysm of unspecified site, without rupture: Secondary | ICD-10-CM | POA: Diagnosis not present

## 2017-09-17 DIAGNOSIS — Z952 Presence of prosthetic heart valve: Secondary | ICD-10-CM | POA: Diagnosis not present

## 2017-09-19 ENCOUNTER — Encounter (HOSPITAL_COMMUNITY): Payer: Medicare Other

## 2017-09-19 ENCOUNTER — Encounter (HOSPITAL_COMMUNITY)
Admission: RE | Admit: 2017-09-19 | Discharge: 2017-09-19 | Disposition: A | Discharge status: Home / Self Care | Payer: Medicare Other | Source: Ambulatory Visit | Attending: Cardiovascular Disease | Admitting: Cardiovascular Disease

## 2017-09-19 DIAGNOSIS — I719 Aortic aneurysm of unspecified site, without rupture: Secondary | ICD-10-CM | POA: Diagnosis not present

## 2017-09-19 DIAGNOSIS — Z8673 Personal history of transient ischemic attack (TIA), and cerebral infarction without residual deficits: Secondary | ICD-10-CM | POA: Diagnosis not present

## 2017-09-19 DIAGNOSIS — Z9889 Other specified postprocedural states: Secondary | ICD-10-CM | POA: Diagnosis not present

## 2017-09-19 DIAGNOSIS — N189 Chronic kidney disease, unspecified: Secondary | ICD-10-CM | POA: Diagnosis not present

## 2017-09-19 DIAGNOSIS — Z79899 Other long term (current) drug therapy: Secondary | ICD-10-CM | POA: Diagnosis not present

## 2017-09-19 DIAGNOSIS — Z952 Presence of prosthetic heart valve: Secondary | ICD-10-CM | POA: Diagnosis not present

## 2017-09-19 DIAGNOSIS — I129 Hypertensive chronic kidney disease with stage 1 through stage 4 chronic kidney disease, or unspecified chronic kidney disease: Secondary | ICD-10-CM | POA: Diagnosis not present

## 2017-09-19 DIAGNOSIS — Z8679 Personal history of other diseases of the circulatory system: Secondary | ICD-10-CM | POA: Diagnosis not present

## 2017-09-21 ENCOUNTER — Encounter (HOSPITAL_COMMUNITY): Payer: Medicare Other

## 2017-09-21 ENCOUNTER — Encounter (HOSPITAL_COMMUNITY)
Admission: RE | Admit: 2017-09-21 | Discharge: 2017-09-21 | Disposition: A | Discharge status: Home / Self Care | Payer: Medicare Other | Source: Ambulatory Visit | Attending: Cardiovascular Disease | Admitting: Cardiovascular Disease

## 2017-09-21 DIAGNOSIS — Z8673 Personal history of transient ischemic attack (TIA), and cerebral infarction without residual deficits: Secondary | ICD-10-CM | POA: Diagnosis not present

## 2017-09-21 DIAGNOSIS — I719 Aortic aneurysm of unspecified site, without rupture: Secondary | ICD-10-CM | POA: Diagnosis not present

## 2017-09-21 DIAGNOSIS — N189 Chronic kidney disease, unspecified: Secondary | ICD-10-CM | POA: Diagnosis not present

## 2017-09-21 DIAGNOSIS — I129 Hypertensive chronic kidney disease with stage 1 through stage 4 chronic kidney disease, or unspecified chronic kidney disease: Secondary | ICD-10-CM | POA: Diagnosis not present

## 2017-09-21 DIAGNOSIS — Z9889 Other specified postprocedural states: Secondary | ICD-10-CM

## 2017-09-21 DIAGNOSIS — Z79899 Other long term (current) drug therapy: Secondary | ICD-10-CM | POA: Diagnosis not present

## 2017-09-21 DIAGNOSIS — Z952 Presence of prosthetic heart valve: Secondary | ICD-10-CM | POA: Diagnosis not present

## 2017-09-21 DIAGNOSIS — Z8679 Personal history of other diseases of the circulatory system: Secondary | ICD-10-CM | POA: Diagnosis not present

## 2017-09-24 ENCOUNTER — Encounter (HOSPITAL_COMMUNITY): Payer: Medicare Other

## 2017-09-24 ENCOUNTER — Encounter (HOSPITAL_COMMUNITY)
Admission: RE | Admit: 2017-09-24 | Discharge: 2017-09-24 | Disposition: A | Discharge status: Home / Self Care | Payer: Medicare Other | Source: Ambulatory Visit | Attending: Cardiovascular Disease | Admitting: Cardiovascular Disease

## 2017-09-24 DIAGNOSIS — I719 Aortic aneurysm of unspecified site, without rupture: Secondary | ICD-10-CM | POA: Insufficient documentation

## 2017-09-24 DIAGNOSIS — Z8679 Personal history of other diseases of the circulatory system: Secondary | ICD-10-CM | POA: Diagnosis not present

## 2017-09-24 DIAGNOSIS — Z9889 Other specified postprocedural states: Secondary | ICD-10-CM | POA: Insufficient documentation

## 2017-09-24 DIAGNOSIS — Z79899 Other long term (current) drug therapy: Secondary | ICD-10-CM | POA: Diagnosis not present

## 2017-09-24 DIAGNOSIS — N189 Chronic kidney disease, unspecified: Secondary | ICD-10-CM | POA: Diagnosis not present

## 2017-09-24 DIAGNOSIS — Z8673 Personal history of transient ischemic attack (TIA), and cerebral infarction without residual deficits: Secondary | ICD-10-CM | POA: Diagnosis not present

## 2017-09-24 DIAGNOSIS — I129 Hypertensive chronic kidney disease with stage 1 through stage 4 chronic kidney disease, or unspecified chronic kidney disease: Secondary | ICD-10-CM | POA: Diagnosis not present

## 2017-09-24 DIAGNOSIS — Z952 Presence of prosthetic heart valve: Secondary | ICD-10-CM | POA: Insufficient documentation

## 2017-09-26 ENCOUNTER — Encounter (HOSPITAL_COMMUNITY): Payer: Medicare Other

## 2017-09-28 ENCOUNTER — Encounter (HOSPITAL_COMMUNITY): Payer: Medicare Other

## 2017-09-28 ENCOUNTER — Telehealth (HOSPITAL_COMMUNITY): Payer: Self-pay | Admitting: Internal Medicine

## 2017-10-01 ENCOUNTER — Encounter (HOSPITAL_COMMUNITY): Payer: Medicare Other

## 2017-10-03 ENCOUNTER — Encounter (HOSPITAL_COMMUNITY)
Admission: RE | Admit: 2017-10-03 | Discharge: 2017-10-03 | Disposition: A | Discharge status: Home / Self Care | Payer: Medicare Other | Source: Ambulatory Visit | Attending: Cardiovascular Disease | Admitting: Cardiovascular Disease

## 2017-10-03 ENCOUNTER — Encounter (HOSPITAL_COMMUNITY): Payer: Medicare Other

## 2017-10-03 DIAGNOSIS — I129 Hypertensive chronic kidney disease with stage 1 through stage 4 chronic kidney disease, or unspecified chronic kidney disease: Secondary | ICD-10-CM | POA: Diagnosis not present

## 2017-10-03 DIAGNOSIS — Z8679 Personal history of other diseases of the circulatory system: Secondary | ICD-10-CM | POA: Diagnosis not present

## 2017-10-03 DIAGNOSIS — Z9889 Other specified postprocedural states: Secondary | ICD-10-CM | POA: Diagnosis not present

## 2017-10-03 DIAGNOSIS — I719 Aortic aneurysm of unspecified site, without rupture: Secondary | ICD-10-CM | POA: Diagnosis not present

## 2017-10-03 DIAGNOSIS — N189 Chronic kidney disease, unspecified: Secondary | ICD-10-CM | POA: Diagnosis not present

## 2017-10-03 DIAGNOSIS — Z8673 Personal history of transient ischemic attack (TIA), and cerebral infarction without residual deficits: Secondary | ICD-10-CM | POA: Diagnosis not present

## 2017-10-03 DIAGNOSIS — Z952 Presence of prosthetic heart valve: Secondary | ICD-10-CM | POA: Diagnosis not present

## 2017-10-03 DIAGNOSIS — Z79899 Other long term (current) drug therapy: Secondary | ICD-10-CM | POA: Diagnosis not present

## 2017-10-03 NOTE — Progress Notes (Signed)
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  acetaminophen (TYLENOL) 500 MG tablet, Take 1,000 mg by mouth every 6 (six) hours as needed for moderate pain or headache. Reported on 12/14/2015, Disp: , Rfl:  .  allopurinol (ZYLOPRIM) 300 MG tablet, TAKE 1 TABLET BY MOUTH  DAILY (Patient taking differently: TAKE 1/2 TABLET BY MOUTH  DAILY), Disp: 90 tablet, Rfl: 1 .  apixaban (ELIQUIS) 2.5 MG TABS tablet, Take 1 tablet (2.5 mg total) by mouth 2 (two) times daily., Disp: 28 tablet, Rfl: 0 .  aspirin 81 MG chewable tablet, Chew 81 mg by mouth daily., Disp: , Rfl:  .  atorvastatin (LIPITOR) 10 MG tablet, TAKE 1 TABLET BY MOUTH  DAILY, Disp: 90 tablet, Rfl: 2 .  calcitRIOL (ROCALTROL) 0.25 MCG capsule, TAKE 1 CAPSULE BY MOUTH  EVERY MONDAY, WEDNESDAY,  AND FRIDAY., Disp: 24 capsule, Rfl: 4 .  carvedilol (COREG) 25 MG tablet, TAKE 1 TABLET BY MOUTH TWO  TIMES DAILY, Disp: 180 tablet, Rfl: 1 .  cetirizine (ZYRTEC) 10 MG tablet, Take 10 mg by mouth daily., Disp: , Rfl:  .  furosemide (LASIX) 40 MG tablet, Take 1 tablet (40 mg total) by mouth daily., Disp: 90 tablet, Rfl: 3 .  Olmesartan-Amlodipine-HCTZ 40-10-12.5 MG TABS, TAKE 1 TABLET BY MOUTH  DAILY, Disp: 90 tablet, Rfl: 1 .  sennosides-docusate sodium (SENOKOT-S) 8.6-50 MG tablet, Take 1 tablet  by mouth 2 (two) times daily., Disp: , Rfl:   Past Medical History: Past Medical History:  Diagnosis Date  . Abdominal aortic aneurysm ([place])   . Anemia   . Anxiety   . Aortic regurgitation 12/10/08   Echo: mild to mod. AOV regurg,mild AO root dilatation, LA mildly dilated, EF 65%, LV wall thickness markedly increased, small PE  . Chronic kidney disease   . Chronic renal disease    advanced  . DVT (deep venous thrombosis) ([place])   . H/O aortic dissection 2009   infarenal abdominal aortic dissection  . History of pulmonary embolus (PE) 2010  . Hypertension    dr [name]  . Left ventricular hypertrophy   . Obesity   . Stroke [facility]) 2009   pt says no stroke    Tobacco Use: History  Smoking Status  . Never Smoker  Smokeless Tobacco  . Never Used    Labs: Recent Review Flowsheet Data    Labs for ITP Cardiac and Pulmonary Rehab Latest Ref Rng & Units 05/07/2009 02/06/2012 02/14/2012 02/29/2012 09/16/2012   Cholestrol 0 - 200 mg/dL - - - 140 -   LDLCALC 0 - 99 mg/dL - - - 84 -   HDL >39 mg/dL - - - 42 -   Trlycerides <150 mg/dL - - - 72 -   PHART 7.350 - 7.450 7.384 - - - 7.433   PCO2ART 35.0 - 45.0 mmHg 36.7 - - -  35.5   HCO3 20.0 - 24.0 mEq/L 21.8 - - - 23.3   TCO2 0 - 100 mmol/L 23 22 21  - 24.4   ACIDBASEDEF 0.0 - 2.0 mmol/L 3.0(H) - - - 0.4   O2SAT % 94.0 - - - 96.8      Capillary Blood Glucose: Lab Results  Component Value Date   GLUCAP 138 (H) 09/25/2012     Exercise Target Goals:    Exercise Program Goal: Individual exercise prescription set with THRR, safety & activity barriers. Participant demonstrates ability to understand and report RPE using BORG scale, to self-measure pulse accurately, and to acknowledge the importance of the exercise prescription.  Exercise Prescription Goal: Starting with aerobic activity 30 plus minutes a day, 3 days per week for initial exercise prescription. Provide home exercise prescription and guidelines that participant acknowledges  understanding prior to discharge.  Activity Barriers & Risk Stratification:     Activity Barriers & Cardiac Risk Stratification - 07/05/17 1432      Activity Barriers & Cardiac Risk Stratification   Activity Barriers Other (comment);Deconditioning   Cardiac Risk Stratification High      6 Minute Walk:     6 Minute Walk    Row Name 07/05/17 1034         6 Minute Walk   Phase Initial     Distance 1345 feet     Walk Time 6 minutes     # of Rest Breaks 0     MPH 2.55     METS 3.21     RPE 13     VO2 Peak 11.24     Symptoms No     Resting HR 99 bpm     Resting BP 124/64     Max Ex. HR 128 bpm     Max Ex. BP 146/80        Oxygen Initial Assessment:   Oxygen Re-Evaluation:   Oxygen Discharge (Final Oxygen Re-Evaluation):   Initial Exercise Prescription:     Initial Exercise Prescription - 07/05/17 1200      Date of Initial Exercise RX and Referring Provider   Date 07/05/17   Referring Provider Croitoru, Mihai MD     Treadmill   MPH 1.7   Grade 1   Minutes 10   METs 2     T5 Nustep   Level 3   SPM 80   Minutes 20   METs 2     Prescription Details   Frequency (times per week) 3   Duration Progress to 45 minutes of aerobic exercise without signs/symptoms of physical distress     Intensity   THRR 40-80% of Max Heartrate 69-138   Ratings of Perceived Exertion 11-13   Perceived Dyspnea 0-4     Progression   Progression Continue to progress workloads to maintain intensity without signs/symptoms of physical distress.     Resistance Training   Training Prescription Yes   Weight 3   Reps 10-15      Perform Capillary Blood Glucose checks as needed.  Exercise Prescription Changes:      Exercise Prescription Changes    Row Name 08/03/17 1400 09/04/17 1100 09/17/17 1800 09/24/17 0948 10/03/17 1200     Response to Exercise   Blood Pressure (Admit) 120/84 108/82 116/76 108/70 112/70   Blood Pressure (Exercise) 124/78 132/84 138/82 118/60  136/82   Blood Pressure (Exit) 106/76 100/64 118/70 100/62 114/60   Heart Rate (Admit) 92 bpm 89 bpm 91 bpm 88 bpm 90  bpm   Heart Rate (Exercise) 123 bpm 115 bpm 119 bpm 117 bpm 121 bpm   Heart Rate (Exit) 84 bpm 82 bpm 81 bpm 86 bpm 84 bpm   Rating of Perceived Exertion (Exercise) 12 13 13 13 12    Symptoms  - none none none none   Comments  -  - Pt return to exercise after being out a few weeks.  -  -   Duration Progress to 45 minutes of aerobic exercise without signs/symptoms of physical distress Progress to 45 minutes of aerobic exercise without signs/symptoms of physical distress Progress to 45 minutes of aerobic exercise without signs/symptoms of physical distress Progress to 45 minutes of aerobic exercise without signs/symptoms of physical distress Progress to 45 minutes of aerobic exercise without signs/symptoms of physical distress   Intensity THRR unchanged THRR unchanged THRR unchanged THRR unchanged THRR unchanged     Progression   Progression Continue to progress workloads to maintain intensity without signs/symptoms of physical distress. Continue to progress workloads to maintain intensity without signs/symptoms of physical distress. Continue to progress workloads to maintain intensity without signs/symptoms of physical distress. Continue to progress workloads to maintain intensity without signs/symptoms of physical distress. Continue to progress workloads to maintain intensity without signs/symptoms of physical distress.   Average METs 2.6 2.8 2.8 2.8 2.8     Resistance Training   Training Prescription Yes Yes Yes No No   Weight 3 3 3   -  -   Reps 10-15 10-15 10-15  -  -   Time  - 10 Minutes 10 Minutes  -  -     Interval Training   Interval Training  - No No No No     Treadmill   MPH 2.2 2.4 2.4 2.4 2.4   Grade 1 2 2 2 2    Minutes 10 10 10 10 10    METs 2.99 3.5 3.5 3.5 3.5     Arm/Foot Ergometer   Level 4 4 4.5 5 5    Watts 45  -  -  -  -   Minutes 10 10 10 10 10    METs  1.8 2.1 2.5 2.1 2.1     T5 Nustep   Level 3 5 5 5 5    SPM 80 80 80 80 80   Minutes 20 20 20 20 10    METs 3.1 2.8 2.5 2.8 2.6     Home Exercise Plan   Plans to continue exercise at Home (comment) Home (comment) Home (comment) Home (comment) Home (comment)   Frequency Add 3 additional days to program exercise sessions. Add 3 additional days to program exercise sessions. Add 3 additional days to program exercise sessions. Add 3 additional days to program exercise sessions. Add 3 additional days to program exercise sessions.   Initial Home Exercises Provided  - 07/25/17 07/25/17 07/25/17 07/25/17      Exercise Comments:      Exercise Comments    Row Name 08/03/17 1418 09/05/17 1458 09/19/17 1118 09/24/17 0948 10/03/17 1000   Exercise Comments Reviewed goals and home exercise with pt.  Making steady progress with exercise. Reviewed METs with patient. Patient is tolerating mild to moderate intensity exercise well. Increasing workloads as able. Reviewed METs and goals with patient.      Exercise Goals and Review:      Exercise Goals    Row Name 07/05/17 0959             Exercise Goals   Increase Physical Activity Yes  Intervention Provide advice, education, support and counseling about physical activity/exercise needs.;Develop an individualized exercise prescription for aerobic and resistive training based on initial evaluation findings, risk stratification, comorbidities and participant's personal goals.       Expected Outcomes Achievement of increased cardiorespiratory fitness and enhanced flexibility, muscular endurance and strength shown through measurements of functional capacity and personal statement of participant.       Increase Strength and Stamina Yes       Intervention Provide advice, education, support and counseling about physical activity/exercise needs.;Develop an individualized exercise prescription for aerobic and resistive training based on initial evaluation  findings, risk stratification, comorbidities and participant's personal goals.       Expected Outcomes Achievement of increased cardiorespiratory fitness and enhanced flexibility, muscular endurance and strength shown through measurements of functional capacity and personal statement of participant.          Exercise Goals Re-Evaluation :     Exercise Goals Re-Evaluation    Row Name 08/03/17 1416 09/05/17 1458 09/19/17 1119 09/24/17 0948 10/03/17 1000     Exercise Goal Re-Evaluation   Exercise Goals Review Increase Physical Activity;Increase Strenth and Stamina Increase Physical Activity;Increase Strength and Stamina Knowledge and understanding of Target Heart Rate Range (THRR);Able to understand and use rate of perceived exertion (RPE) scale Able to understand and use rate of perceived exertion (RPE) scale;Increase Physical Activity Increase Physical Activity   Comments Pt is doing well with exericse and already tolerating workload increases well.  He states he has already noticed an increase in his energy level since he began exercising Pt is making steady progress with exercise. Increasing workloads as tolerated. Reviewed THRR and RPE scale use with patient. Pt has been out a few weeks, increasing workloads as tolerated. Patient has missed some sessions of CR for personal routine. Increasing workloads as able. Patient states this is the first time he's been able to maintain his TM speed at 2.4/2 for the entire 10 minutes station. Encouraged patient to add one additional day of walking to supplement exercise at cardiac rehab, and pt is agreeable to this.   Expected Outcomes continue with exercise Rx and increase workloads as tolerated in order to increase cardiorespiratory fitness.  Continue exercise routine to help achieve fitness and weight loss goals. Continue exercise routine to help achieve fitness and weight loss goals. Maintain a more consistent exercise routine at least 3-4 days/week. Add at  least one other day of walking 30 minutes at home in addition to exercise at cardiac rehab.       Discharge Exercise Prescription (Final Exercise Prescription Changes):     Exercise Prescription Changes - 10/03/17 1200      Response to Exercise   Blood Pressure (Admit) 112/70   Blood Pressure (Exercise) 136/82   Blood Pressure (Exit) 114/60   Heart Rate (Admit) 90 bpm   Heart Rate (Exercise) 121 bpm   Heart Rate (Exit) 84 bpm   Rating of Perceived Exertion (Exercise) 12   Symptoms none   Duration Progress to 45 minutes of aerobic exercise without signs/symptoms of physical distress   Intensity THRR unchanged     Progression   Progression Continue to progress workloads to maintain intensity without signs/symptoms of physical distress.   Average METs 2.8     Resistance Training   Training Prescription No     Interval Training   Interval Training No     Treadmill   MPH 2.4   Grade 2   Minutes 10   METs 3.5  Arm/Foot Ergometer   Level 5   Minutes 10   METs 2.1     T5 Nustep   Level 5   SPM 80   Minutes 10   METs 2.6     Home Exercise Plan   Plans to continue exercise at Home (comment)   Frequency Add 3 additional days to program exercise sessions.   Initial Home Exercises Provided 07/25/17      Nutrition:  Target Goals: Understanding of nutrition guidelines, daily intake of sodium 1500mg , cholesterol 200mg , calories 30% from fat and 7% or less from saturated fats, daily to have 5 or more servings of fruits and vegetables.  Biometrics:     Pre Biometrics - 07/05/17 1427      Pre Biometrics   Height 6' (1.829 m)   Weight (!)  364 lb 3.2 oz (165.2 kg)   Waist Circumference 56.5 inches   Hip Circumference 69 inches   Waist to Hip Ratio 0.82 %   BMI (Calculated) 49.5   Triceps Skinfold 44 mm   % Body Fat 46.8 %   Grip Strength 38.5 kg   Flexibility 15.5 in   Single Leg Stand 12.6 seconds       Nutrition Therapy Plan and Nutrition Goals:      Nutrition Therapy & Goals - 07/05/17 1055      Nutrition Therapy   Diet Renal, Heart Healthy   Drug/Food Interactions Purine/Gout     Personal Nutrition Goals   Nutrition Goal Pt to increase self efficacy re: combining Heart Healthy and Kidney diets.   Personal Goal #2 Pt to identify food quantities necessary to achieve weight loss of 6-24 lb (2.7-10.9 kg) at graduation from cardiac rehab.     Intervention Plan   Intervention Prescribe, educate and counsel regarding individualized specific dietary modifications aiming towards targeted core components such as weight, hypertension, lipid management, diabetes, heart failure and other comorbidities.;Nutrition handout(s) given to patient.  Potassium Content of Foods, Phosphorus Content of Foods, and Purine Restricted Nutrition Therapy   Expected Outcomes Short Term Goal: Understand basic principles of dietary content, such as calories, fat, sodium, cholesterol and nutrients.;Long Term Goal: Adherence to prescribed nutrition plan.      Nutrition Discharge: Nutrition Scores:     Nutrition Assessments - 07/05/17 1106      MEDFICTS Scores   Pre Score 56      Nutrition Goals Re-Evaluation:   Nutrition Goals Re-Evaluation:   Nutrition Goals Discharge (Final Nutrition Goals Re-Evaluation):   Psychosocial: Target Goals: Acknowledge presence or absence of significant depression and/or stress, maximize coping skills, provide positive support system. Participant is able to verbalize types and ability to use techniques and skills needed for reducing stress and depression.  Initial Review & Psychosocial Screening:     Initial Psych Review & Screening - 07/05/17 1422      Initial Review   Current issues with None Identified     Family Dynamics   Good Support System? Yes     Barriers   Psychosocial barriers to participate in program There are no identifiable barriers or psychosocial needs.;The patient should benefit from training in  stress management and relaxation.     Screening Interventions   Interventions Encouraged to exercise      Quality of Life Scores:     Quality of Life - 07/05/17 1205      Quality of Life Scores   Health/Function Pre 19.11 %   Socioeconomic Pre 18 %   Psych/Spiritual Pre 24 %  Family Pre 12 %   GLOBAL Pre 19.05 %      PHQ-9: Recent Review Flowsheet Data    Depression screen [facility] 2/9 07/11/2017   Decreased Interest 0   Down, Depressed, Hopeless 0   PHQ - 2 Score 0     Interpretation of Total Score  Total Score Depression Severity:  1-4 = Minimal depression, 5-9 = Mild depression, 10-14 = Moderate depression, 15-19 = Moderately severe depression, 20-27 = Severe depression   Psychosocial Evaluation and Intervention:   Psychosocial Re-Evaluation:     Psychosocial Re-Evaluation    [place] Name 08/08/17 1748 09/06/17 1022 10/03/17 1643 10/03/17 1646       Psychosocial Re-Evaluation   Current issues with None Identified;Current Stress Concerns Current Stress Concerns Current Stress Concerns Current Stress Concerns    Comments -  Airik's has some stressors with health of his family members -  Fabiola Backer continues to assist in the care of his wife when needed Omarri does well with exercise. Otto continues to have stress concerns with his wife health and other family memebers Eual does well with exercise. Franchot continues to have stress concerns with his wife health and other family memebers    Interventions Encouraged to attend Cardiac Rehabilitation for the exercise;Relaxation education Encouraged to attend Cardiac Rehabilitation for the exercise;Relaxation education  - Encouraged to attend Cardiac Rehabilitation for the exercise;Relaxation education    Continue Psychosocial Services  No Follow up required No Follow up required No Follow up required No Follow up required      Initial Review   Source of Stress Concerns Family Family Family  -       Psychosocial  Discharge (Final Psychosocial Re-Evaluation):     Psychosocial Re-Evaluation - 10/03/17 1646      Psychosocial Re-Evaluation   Current issues with Current Stress Concerns   Comments Zaheer does well with exercise. Mohamad continues to have stress concerns with his wife health and other family memebers   Interventions Encouraged to attend Cardiac Rehabilitation for the exercise;Relaxation education   Continue Psychosocial Services  No Follow up required      Vocational Rehabilitation: Provide vocational rehab assistance to qualifying candidates.   Vocational Rehab Evaluation & Intervention:     Vocational Rehab - 07/05/17 1409      Initial Vocational Rehab Evaluation & Intervention   Assessment shows need for Vocational Rehabilitation Yes      Education: Education Goals: Education classes will be provided on a weekly basis, covering required topics. Participant will state understanding/return demonstration of topics presented.  Learning Barriers/Preferences:     Learning Barriers/Preferences - 07/05/17 1409      Learning Barriers/Preferences   Learning Barriers Sight  wears contacts   Learning Preferences Verbal Instruction;Written Material;Audio      Education Topics: Count Your Pulse:  -Group instruction provided by verbal instruction, demonstration, patient participation and written materials to support subject.  Instructors address importance of being able to find your pulse and how to count your pulse when at home without a heart monitor.  Patients get hands on experience counting their pulse with staff help and individually.   Heart Attack, Angina, and Risk Factor Modification:  -Group instruction provided by verbal instruction, video, and written materials to support subject.  Instructors address signs and symptoms of angina and heart attacks.    Also discuss risk factors for heart disease and how to make changes to improve heart health risk  factors.   Functional Fitness:  -Group instruction provided by verbal  instruction, demonstration, patient participation, and written materials to support subject.  Instructors address safety measures for doing things around the house.  Discuss how to get up and down off the floor, how to pick things up properly, how to safely get out of a chair without assistance, and balance training.   CARDIAC REHAB PHASE II EXERCISE from 10/03/2017 in [place]  Date  07/13/17  Instruction Review Code  2- meets goals/outcomes      Meditation and Mindfulness:  -Group instruction provided by verbal instruction, patient participation, and written materials to support subject.  Instructor addresses importance of mindfulness and meditation practice to help reduce stress and improve awareness.  Instructor also leads participants through a meditation exercise.    Stretching for Flexibility and Mobility:  -Group instruction provided by verbal instruction, patient participation, and written materials to support subject.  Instructors lead participants through series of stretches that are designed to increase flexibility thus improving mobility.  These stretches are additional exercise for major muscle groups that are typically performed during regular warm up and cool down.   CARDIAC REHAB PHASE II EXERCISE from 10/03/2017 in [place]  Date  07/20/17  Instruction Review Code  2- meets goals/outcomes      Hands Only CPR:  -Group verbal, video, and participation provides a basic overview of AHA guidelines for community CPR. Role-play of emergencies allow participants the opportunity to practice calling for help and chest compression technique with discussion of AED use.   Hypertension: -Group verbal and written instruction that provides a basic overview of hypertension including the most recent diagnostic guidelines, risk factor reduction with  self-care instructions and medication management.    Nutrition I class: Heart Healthy Eating:  -Group instruction provided by PowerPoint slides, verbal discussion, and written materials to support subject matter. The instructor gives an explanation and review of the Therapeutic Lifestyle Changes diet recommendations, which includes a discussion on lipid goals, dietary fat, sodium, fiber, plant stanol/sterol esters, sugar, and the components of a well-balanced, healthy diet.   CARDIAC REHAB PHASE II EXERCISE from 10/03/2017 in [place]  Date  08/06/17  Educator  RD  Instruction Review Code  Not applicable      Nutrition II class: Lifestyle Skills:  -Group instruction provided by PowerPoint slides, verbal discussion, and written materials to support subject matter. The instructor gives an explanation and review of label reading, grocery shopping for heart health, heart healthy recipe modifications, and ways to make healthier choices when eating out.   CARDIAC REHAB PHASE II EXERCISE from 10/03/2017 in [place]  Date  08/06/17  Educator  RD  Instruction Review Code  Not applicable      Diabetes Question & Answer:  -Group instruction provided by PowerPoint slides, verbal discussion, and written materials to support subject matter. The instructor gives an explanation and review of diabetes co-morbidities, pre- and post-prandial blood glucose goals, pre-exercise blood glucose goals, signs, symptoms, and treatment of hypoglycemia and hyperglycemia, and foot care basics.   Diabetes Blitz:  -Group instruction provided by PowerPoint slides, verbal discussion, and written materials to support subject matter. The instructor gives an explanation and review of the physiology behind type 1 and type 2 diabetes, diabetes medications and rational behind using different medications, pre- and post-prandial blood glucose recommendations and  Hemoglobin A1c goals, diabetes diet, and exercise including blood glucose guidelines for exercising safely.    Portion Distortion:  -  Group instruction provided by PowerPoint slides, verbal discussion, written materials, and food models to support subject matter. The instructor gives an explanation of serving size versus portion size, changes in portions sizes over the last 20 years, and what consists of a serving from each food group.   CARDIAC REHAB PHASE II EXERCISE from 10/03/2017 in [place]  Date  08/15/17  Educator  RD  Instruction Review Code  2- meets goals/outcomes      Stress Management:  -Group instruction provided by verbal instruction, video, and written materials to support subject matter.  Instructors review role of stress in heart disease and how to cope with stress positively.     Exercising on Your Own:  -Group instruction provided by verbal instruction, power point, and written materials to support subject.  Instructors discuss benefits of exercise, components of exercise, frequency and intensity of exercise, and end points for exercise.  Also discuss use of nitroglycerin and activating EMS.  Review options of places to exercise outside of rehab.  Review guidelines for sex with heart disease.   CARDIAC REHAB PHASE II EXERCISE from 10/03/2017 in [place]  Date  08/29/17  Instruction Review Code  2- meets goals/outcomes      Cardiac Drugs I:  -Group instruction provided by verbal instruction and written materials to support subject.  Instructor reviews cardiac drug classes: antiplatelets, anticoagulants, beta blockers, and statins.  Instructor discusses reasons, side effects, and lifestyle considerations for each drug class.   Cardiac Drugs II:  -Group instruction provided by verbal instruction and written materials to support subject.  Instructor reviews cardiac drug classes: angiotensin converting  enzyme inhibitors (ACE-I), angiotensin II receptor blockers (ARBs), nitrates, and calcium channel blockers.  Instructor discusses reasons, side effects, and lifestyle considerations for each drug class.   CARDIAC REHAB PHASE II EXERCISE from 10/03/2017 in [place]  Date  10/03/17  Instruction Review Code  2- meets goals/outcomes      Anatomy and Physiology of the Circulatory System:  Group verbal and written instruction and models provide basic cardiac anatomy and physiology, with the coronary electrical and arterial systems. Review of: AMI, Angina, Valve disease, Heart Failure, Peripheral Artery Disease, Cardiac Arrhythmia, Pacemakers, and the ICD.   CARDIAC REHAB PHASE II EXERCISE from 10/03/2017 in [place]  Date  07/25/17  Instruction Review Code  2- meets goals/outcomes      Other Education:  -Group or individual verbal, written, or video instructions that support the educational goals of the cardiac rehab program.   Knowledge Questionnaire Score:     Knowledge Questionnaire Score - 07/05/17 1006      Knowledge Questionnaire Score   Pre Score 22/24      Core Components/Risk Factors/Patient Goals at Admission:     Personal Goals and Risk Factors at Admission - 07/05/17 1408      Core Components/Risk Factors/Patient Goals on Admission    Weight Management Yes;Obesity;Weight Maintenance;Weight Loss   Intervention Weight Management: Develop a combined nutrition and exercise program designed to reach desired caloric intake, while maintaining appropriate intake of nutrient and fiber, sodium and fats, and appropriate energy expenditure required for the weight goal.;Weight Management: Provide education and appropriate resources to help participant work on and attain dietary goals.;Weight Management/Obesity: Establish reasonable short term and long term weight goals.;Obesity: Provide education and appropriate  resources to help participant work on and attain dietary goals.   Expected Outcomes Short  Term: Continue to assess and modify interventions until short term weight is achieved;Long Term: Adherence to nutrition and physical activity/exercise program aimed toward attainment of established weight goal;Weight Maintenance: Understanding of the daily nutrition guidelines, which includes 25-35% calories from fat, 7% or less cal from saturated fats, less than 200mg  cholesterol, less than 1.5gm of sodium, & 5 or more servings of fruits and vegetables daily;Weight Loss: Understanding of general recommendations for a balanced deficit meal plan, which promotes 1-2 lb weight loss per week and includes a negative energy balance of [phone] kcal/d;Understanding recommendations for meals to include 15-35% energy as protein, 25-35% energy from fat, 35-60% energy from carbohydrates, less than 200mg  of dietary cholesterol, 20-35 gm of total fiber daily;Understanding of distribution of calorie intake throughout the day with the consumption of 4-5 meals/snacks   Hypertension Yes   Intervention Provide education on lifestyle modifcations including regular physical activity/exercise, weight management, moderate sodium restriction and increased consumption of fresh fruit, vegetables, and low fat dairy, alcohol moderation, and smoking cessation.;Monitor prescription use compliance.   Expected Outcomes Short Term: Continued assessment and intervention until BP is < 140/80mm HG in hypertensive participants. < 130/88mm HG in hypertensive participants with diabetes, heart failure or chronic kidney disease.;Long Term: Maintenance of blood pressure at goal levels.   Stress Yes   Intervention Offer individual and/or small group education and counseling on adjustment to heart disease, stress management and health-related lifestyle change. Teach and support self-help strategies.;Refer participants experiencing significant psychosocial distress  to appropriate mental health specialists for further evaluation and treatment. When possible, include family members and significant others in education/counseling sessions.   Expected Outcomes Short Term: Participant demonstrates changes in health-related behavior, relaxation and other stress management skills, ability to obtain effective social support, and compliance with psychotropic medications if prescribed.;Long Term: Emotional wellbeing is indicated by absence of clinically significant psychosocial distress or social isolation.      Core Components/Risk Factors/Patient Goals Review:      Goals and Risk Factor Review    Row Name 09/06/17 1018 09/06/17 1019 10/03/17 1643         Core Components/Risk Factors/Patient Goals Review   Personal Goals Review Weight Management/Obesity Hypertension;Stress;Weight Management/Obesity Hypertension;Stress;Weight Management/Obesity     Review  - Keoni's vital signs have been stable at exercise. Jadier has maintianed a steady weight. Jdyn's vital signs have been stable at exercise. Waris has maintianed a steady weight.     Expected Outcomes  - Froilan will continue to attend cardiac rehab for health benifits. Will continue to offer emotional supoort for stress managment. Jadd will continue to attend cardiac rehab for health benifits. Will continue to offer emotional supoort for stress managment.        Core Components/Risk Factors/Patient Goals at Discharge (Final Review):      Goals and Risk Factor Review - 10/03/17 1643      Core Components/Risk Factors/Patient Goals Review   Personal Goals Review Hypertension;Stress;Weight Management/Obesity   Review Abel's vital signs have been stable at exercise. Ebony has maintianed a steady weight.   Expected Outcomes [place]ne will continue to attend cardiac rehab for health benifits. Will continue to offer emotional supoort for stress managment.      ITP Comments:     ITP Comments     Row Name 07/05/17 0925           ITP Comments Dr. Fransico Him, Medical Director          Comments: Dayyan is making expected progress toward personal goals  after completing 23 sessions. Recommend continued exercise and life style modification education including  stress management and relaxation techniques to decrease cardiac risk profile. Lawsen continues to do well with exercise. Lajuan attendance has been sparse recently due to taking care of his wife.Barnet Pall, RN,BSN 10/03/2017 4:50 PM

## 2017-10-05 ENCOUNTER — Encounter (HOSPITAL_COMMUNITY): Payer: Medicare Other

## 2017-10-05 ENCOUNTER — Encounter (HOSPITAL_COMMUNITY)
Admission: RE | Admit: 2017-10-05 | Discharge: 2017-10-05 | Disposition: A | Discharge status: Home / Self Care | Payer: Medicare Other | Source: Ambulatory Visit | Attending: Cardiovascular Disease | Admitting: Cardiovascular Disease

## 2017-10-05 DIAGNOSIS — Z952 Presence of prosthetic heart valve: Secondary | ICD-10-CM | POA: Diagnosis not present

## 2017-10-05 DIAGNOSIS — Z8673 Personal history of transient ischemic attack (TIA), and cerebral infarction without residual deficits: Secondary | ICD-10-CM | POA: Diagnosis not present

## 2017-10-05 DIAGNOSIS — I129 Hypertensive chronic kidney disease with stage 1 through stage 4 chronic kidney disease, or unspecified chronic kidney disease: Secondary | ICD-10-CM | POA: Diagnosis not present

## 2017-10-05 DIAGNOSIS — Z8679 Personal history of other diseases of the circulatory system: Secondary | ICD-10-CM | POA: Diagnosis not present

## 2017-10-05 DIAGNOSIS — Z9889 Other specified postprocedural states: Secondary | ICD-10-CM | POA: Diagnosis not present

## 2017-10-05 DIAGNOSIS — Z79899 Other long term (current) drug therapy: Secondary | ICD-10-CM | POA: Diagnosis not present

## 2017-10-05 DIAGNOSIS — N189 Chronic kidney disease, unspecified: Secondary | ICD-10-CM | POA: Diagnosis not present

## 2017-10-05 DIAGNOSIS — I719 Aortic aneurysm of unspecified site, without rupture: Secondary | ICD-10-CM | POA: Diagnosis not present

## 2017-10-08 ENCOUNTER — Encounter (HOSPITAL_COMMUNITY): Payer: Medicare Other

## 2017-10-08 ENCOUNTER — Encounter (HOSPITAL_COMMUNITY): Admission: RE | Admit: 2017-10-08 | Payer: Medicare Other | Source: Ambulatory Visit

## 2017-10-08 ENCOUNTER — Telehealth (HOSPITAL_COMMUNITY): Payer: Self-pay | Admitting: Internal Medicine

## 2017-10-10 ENCOUNTER — Encounter (HOSPITAL_COMMUNITY)
Admission: RE | Admit: 2017-10-10 | Discharge: 2017-10-10 | Disposition: A | Discharge status: Home / Self Care | Payer: Medicare Other | Source: Ambulatory Visit | Attending: Cardiovascular Disease | Admitting: Cardiovascular Disease

## 2017-10-10 ENCOUNTER — Encounter (HOSPITAL_COMMUNITY): Payer: Medicare Other

## 2017-10-10 DIAGNOSIS — I719 Aortic aneurysm of unspecified site, without rupture: Secondary | ICD-10-CM | POA: Diagnosis not present

## 2017-10-10 DIAGNOSIS — Z952 Presence of prosthetic heart valve: Secondary | ICD-10-CM | POA: Diagnosis not present

## 2017-10-10 DIAGNOSIS — Z8673 Personal history of transient ischemic attack (TIA), and cerebral infarction without residual deficits: Secondary | ICD-10-CM | POA: Diagnosis not present

## 2017-10-10 DIAGNOSIS — Z9889 Other specified postprocedural states: Secondary | ICD-10-CM

## 2017-10-10 DIAGNOSIS — I129 Hypertensive chronic kidney disease with stage 1 through stage 4 chronic kidney disease, or unspecified chronic kidney disease: Secondary | ICD-10-CM | POA: Diagnosis not present

## 2017-10-10 DIAGNOSIS — N189 Chronic kidney disease, unspecified: Secondary | ICD-10-CM | POA: Diagnosis not present

## 2017-10-10 DIAGNOSIS — Z79899 Other long term (current) drug therapy: Secondary | ICD-10-CM | POA: Diagnosis not present

## 2017-10-10 DIAGNOSIS — Z8679 Personal history of other diseases of the circulatory system: Secondary | ICD-10-CM | POA: Diagnosis not present

## 2017-10-12 ENCOUNTER — Encounter (HOSPITAL_COMMUNITY): Payer: Medicare Other

## 2017-10-12 ENCOUNTER — Encounter (HOSPITAL_COMMUNITY)
Admission: RE | Admit: 2017-10-12 | Discharge: 2017-10-12 | Disposition: A | Discharge status: Home / Self Care | Payer: Medicare Other | Source: Ambulatory Visit | Attending: Cardiovascular Disease | Admitting: Cardiovascular Disease

## 2017-10-12 ENCOUNTER — Telehealth: Payer: Self-pay | Admitting: Cardiology

## 2017-10-12 DIAGNOSIS — Z952 Presence of prosthetic heart valve: Secondary | ICD-10-CM | POA: Diagnosis not present

## 2017-10-12 DIAGNOSIS — Z79899 Other long term (current) drug therapy: Secondary | ICD-10-CM | POA: Diagnosis not present

## 2017-10-12 DIAGNOSIS — Z9889 Other specified postprocedural states: Secondary | ICD-10-CM | POA: Diagnosis not present

## 2017-10-12 DIAGNOSIS — I719 Aortic aneurysm of unspecified site, without rupture: Secondary | ICD-10-CM | POA: Diagnosis not present

## 2017-10-12 DIAGNOSIS — N189 Chronic kidney disease, unspecified: Secondary | ICD-10-CM | POA: Diagnosis not present

## 2017-10-12 DIAGNOSIS — Z8673 Personal history of transient ischemic attack (TIA), and cerebral infarction without residual deficits: Secondary | ICD-10-CM | POA: Diagnosis not present

## 2017-10-12 DIAGNOSIS — Z8679 Personal history of other diseases of the circulatory system: Secondary | ICD-10-CM | POA: Diagnosis not present

## 2017-10-12 DIAGNOSIS — I129 Hypertensive chronic kidney disease with stage 1 through stage 4 chronic kidney disease, or unspecified chronic kidney disease: Secondary | ICD-10-CM | POA: Diagnosis not present

## 2017-10-12 NOTE — Telephone Encounter (Signed)
Closed Encounter  °

## 2017-10-16 ENCOUNTER — Other Ambulatory Visit: Payer: Self-pay | Admitting: Cardiovascular Disease

## 2017-10-17 ENCOUNTER — Encounter (HOSPITAL_COMMUNITY)
Admission: RE | Admit: 2017-10-17 | Discharge: 2017-10-17 | Disposition: A | Discharge status: Home / Self Care | Payer: Medicare Other | Source: Ambulatory Visit | Attending: Cardiovascular Disease | Admitting: Cardiovascular Disease

## 2017-10-17 DIAGNOSIS — Z8679 Personal history of other diseases of the circulatory system: Secondary | ICD-10-CM | POA: Diagnosis not present

## 2017-10-17 DIAGNOSIS — I129 Hypertensive chronic kidney disease with stage 1 through stage 4 chronic kidney disease, or unspecified chronic kidney disease: Secondary | ICD-10-CM | POA: Diagnosis not present

## 2017-10-17 DIAGNOSIS — Z79899 Other long term (current) drug therapy: Secondary | ICD-10-CM | POA: Diagnosis not present

## 2017-10-17 DIAGNOSIS — I719 Aortic aneurysm of unspecified site, without rupture: Secondary | ICD-10-CM | POA: Diagnosis not present

## 2017-10-17 DIAGNOSIS — Z952 Presence of prosthetic heart valve: Secondary | ICD-10-CM

## 2017-10-17 DIAGNOSIS — Z8673 Personal history of transient ischemic attack (TIA), and cerebral infarction without residual deficits: Secondary | ICD-10-CM | POA: Diagnosis not present

## 2017-10-17 DIAGNOSIS — Z9889 Other specified postprocedural states: Secondary | ICD-10-CM | POA: Diagnosis not present

## 2017-10-17 DIAGNOSIS — N189 Chronic kidney disease, unspecified: Secondary | ICD-10-CM | POA: Diagnosis not present

## 2017-10-19 ENCOUNTER — Encounter (HOSPITAL_COMMUNITY)
Admission: RE | Admit: 2017-10-19 | Discharge: 2017-10-19 | Disposition: A | Discharge status: Home / Self Care | Payer: Medicare Other | Source: Ambulatory Visit | Attending: Cardiovascular Disease | Admitting: Cardiovascular Disease

## 2017-10-19 DIAGNOSIS — Z79899 Other long term (current) drug therapy: Secondary | ICD-10-CM | POA: Diagnosis not present

## 2017-10-19 DIAGNOSIS — N189 Chronic kidney disease, unspecified: Secondary | ICD-10-CM | POA: Diagnosis not present

## 2017-10-19 DIAGNOSIS — Z952 Presence of prosthetic heart valve: Secondary | ICD-10-CM | POA: Diagnosis not present

## 2017-10-19 DIAGNOSIS — Z8673 Personal history of transient ischemic attack (TIA), and cerebral infarction without residual deficits: Secondary | ICD-10-CM | POA: Diagnosis not present

## 2017-10-19 DIAGNOSIS — Z9889 Other specified postprocedural states: Secondary | ICD-10-CM

## 2017-10-19 DIAGNOSIS — I129 Hypertensive chronic kidney disease with stage 1 through stage 4 chronic kidney disease, or unspecified chronic kidney disease: Secondary | ICD-10-CM | POA: Diagnosis not present

## 2017-10-19 DIAGNOSIS — I719 Aortic aneurysm of unspecified site, without rupture: Secondary | ICD-10-CM | POA: Diagnosis not present

## 2017-10-19 DIAGNOSIS — Z8679 Personal history of other diseases of the circulatory system: Secondary | ICD-10-CM | POA: Diagnosis not present

## 2017-10-24 ENCOUNTER — Encounter (HOSPITAL_COMMUNITY)
Admission: RE | Admit: 2017-10-24 | Discharge: 2017-10-24 | Disposition: A | Discharge status: Home / Self Care | Payer: Medicare Other | Source: Ambulatory Visit | Attending: Cardiovascular Disease | Admitting: Cardiovascular Disease

## 2017-10-24 DIAGNOSIS — Z79899 Other long term (current) drug therapy: Secondary | ICD-10-CM | POA: Diagnosis not present

## 2017-10-24 DIAGNOSIS — Z9889 Other specified postprocedural states: Secondary | ICD-10-CM | POA: Diagnosis not present

## 2017-10-24 DIAGNOSIS — I129 Hypertensive chronic kidney disease with stage 1 through stage 4 chronic kidney disease, or unspecified chronic kidney disease: Secondary | ICD-10-CM | POA: Diagnosis not present

## 2017-10-24 DIAGNOSIS — Z8679 Personal history of other diseases of the circulatory system: Secondary | ICD-10-CM | POA: Diagnosis not present

## 2017-10-24 DIAGNOSIS — N189 Chronic kidney disease, unspecified: Secondary | ICD-10-CM | POA: Diagnosis not present

## 2017-10-24 DIAGNOSIS — Z8673 Personal history of transient ischemic attack (TIA), and cerebral infarction without residual deficits: Secondary | ICD-10-CM | POA: Diagnosis not present

## 2017-10-24 DIAGNOSIS — Z952 Presence of prosthetic heart valve: Secondary | ICD-10-CM | POA: Diagnosis not present

## 2017-10-24 DIAGNOSIS — I719 Aortic aneurysm of unspecified site, without rupture: Secondary | ICD-10-CM | POA: Diagnosis not present

## 2017-10-26 ENCOUNTER — Encounter (HOSPITAL_COMMUNITY): Payer: Self-pay | Admitting: *Deleted

## 2017-10-26 DIAGNOSIS — Z9889 Other specified postprocedural states: Secondary | ICD-10-CM

## 2017-10-26 DIAGNOSIS — Z952 Presence of prosthetic heart valve: Secondary | ICD-10-CM

## 2017-10-26 NOTE — Progress Notes (Signed)
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  acetaminophen (TYLENOL) 500 MG tablet, Take 1,000 mg by mouth every 6 (six) hours as needed for moderate pain or headache. Reported on 12/14/2015, Disp: , Rfl:  .  allopurinol (ZYLOPRIM) 300 MG tablet, TAKE 1 TABLET BY MOUTH  DAILY (Patient taking differently: TAKE 1/2 TABLET BY MOUTH  DAILY), Disp: 90 tablet, Rfl: 1 .  apixaban (ELIQUIS) 2.5 MG TABS tablet, Take 1 tablet (2.5 mg total) by mouth 2 (two) times daily., Disp: 28 tablet, Rfl: 0 .  aspirin 81 MG chewable tablet, Chew 81 mg by mouth daily., Disp: , Rfl:  .  atorvastatin (LIPITOR) 10 MG tablet, TAKE 1 TABLET BY MOUTH  DAILY, Disp: 90 tablet, Rfl: 2 .  calcitRIOL (ROCALTROL) 0.25 MCG capsule, TAKE 1 CAPSULE BY MOUTH  EVERY MONDAY, WEDNESDAY,  AND FRIDAY., Disp: 24 capsule, Rfl: 4 .  carvedilol (COREG) 25 MG tablet, TAKE 1 TABLET BY MOUTH TWO  TIMES DAILY, Disp: 180 tablet, Rfl: 1 .  cetirizine (ZYRTEC) 10 MG tablet, Take 10 mg by mouth daily., Disp: , Rfl:  .  furosemide (LASIX) 40 MG tablet, TAKE 1 TABLET BY MOUTH  DAILY, Disp: 90 tablet, Rfl: 0 .  Olmesartan-Amlodipine-HCTZ 40-10-12.5 MG TABS, TAKE 1 TABLET BY MOUTH  DAILY, Disp: 90 tablet, Rfl: 1 .  sennosides-docusate sodium (SENOKOT-S) 8.6-50 MG tablet, Take 1 tablet by  mouth 2 (two) times daily., Disp: , Rfl:   Past Medical History: Past Medical History:  Diagnosis Date  . Abdominal aortic aneurysm ([place])   . Anemia   . Anxiety   . Aortic regurgitation 12/10/08   Echo: mild to mod. AOV regurg,mild AO root dilatation, LA mildly dilated, EF 65%, LV wall thickness markedly increased, small PE  . Chronic kidney disease   . Chronic renal disease    advanced  . DVT (deep venous thrombosis) ([place])   . H/O aortic dissection 2009   infarenal abdominal aortic dissection  . History of pulmonary embolus (PE) 2010  . Hypertension    dr [name]  . Left ventricular hypertrophy   . Obesity   . Stroke [facility]) 2009   pt says no stroke    Tobacco Use: History  Smoking Status  . Never Smoker  Smokeless Tobacco  . Never Used    Labs: Recent Review Flowsheet Data    Labs for ITP Cardiac and Pulmonary Rehab Latest Ref Rng & Units 05/07/2009 02/06/2012 02/14/2012 02/29/2012 09/16/2012   Cholestrol 0 - 200 mg/dL - - - 140 -   LDLCALC 0 - 99 mg/dL - - - 84 -   HDL >39 mg/dL - - - 42 -   Trlycerides <150 mg/dL - - - 72 -   PHART 7.350 - 7.450 7.384 - - - 7.433   PCO2ART 35.0 - 45.0 mmHg 36.7 - - -  35.5   HCO3 20.0 - 24.0 mEq/L 21.8 - - - 23.3   TCO2 0 - 100 mmol/L 23 22 21  - 24.4   ACIDBASEDEF 0.0 - 2.0 mmol/L 3.0(H) - - - 0.4   O2SAT % 94.0 - - - 96.8      Capillary Blood Glucose: Lab Results  Component Value Date   GLUCAP 138 (H) 09/25/2012     Exercise Target Goals:    Exercise Program Goal: Individual exercise prescription set with THRR, safety & activity barriers. Participant demonstrates ability to understand and report RPE using BORG scale, to self-measure pulse accurately, and to acknowledge the importance of the exercise prescription.  Exercise Prescription Goal: Starting with aerobic activity 30 plus minutes a day, 3 days per week for initial exercise prescription. Provide home exercise prescription and guidelines that participant acknowledges  understanding prior to discharge.  Activity Barriers & Risk Stratification:   6 Minute Walk:   Oxygen Initial Assessment:   Oxygen Re-Evaluation:   Oxygen Discharge (Final Oxygen Re-Evaluation):   Initial Exercise Prescription:   Perform Capillary Blood Glucose checks as needed.  Exercise Prescription Changes:     Exercise Prescription Changes    Row Name 09/04/17 1100 09/17/17 1800 09/24/17 0948 10/03/17 1200 10/24/17 1100     Response to Exercise   Blood Pressure (Admit) 108/82 116/76 108/70 112/70 116/82   Blood Pressure (Exercise) 132/84 138/82 118/60 136/82 124/82   Blood Pressure (Exit) 100/64 118/70 100/62 114/60 104/70   Heart Rate (Admit) 89 bpm 91 bpm 88 bpm 90 bpm 85 bpm   Heart Rate (Exercise) 115 bpm 119 bpm 117 bpm 121 bpm 120 bpm   Heart Rate (Exit) 82 bpm 81 bpm 86 bpm 84 bpm 82 bpm   Rating of Perceived Exertion (Exercise) 13 13 13 12 12    Symptoms none none none none none   Comments  - Pt return to exercise after being out a few weeks.  -  -  -   Duration Progress to 45 minutes of aerobic exercise without signs/symptoms of physical distress Progress to 45 minutes of aerobic exercise without signs/symptoms of physical distress Progress to 45 minutes of aerobic exercise without signs/symptoms of physical distress Progress to 45 minutes of aerobic exercise without signs/symptoms of physical distress Progress to 45 minutes of aerobic exercise without signs/symptoms of physical distress   Intensity THRR unchanged THRR unchanged THRR unchanged THRR unchanged THRR unchanged     Progression   Progression Continue to progress workloads to maintain intensity without signs/symptoms of physical distress. Continue to progress workloads to maintain intensity without signs/symptoms of physical distress. Continue to progress workloads to maintain intensity without signs/symptoms of physical distress. Continue to progress workloads to maintain intensity without  signs/symptoms of physical distress. Continue to progress workloads to maintain intensity without signs/symptoms of physical distress.   Average METs 2.8 2.8 2.8 2.8 3.2     Resistance Training   Training Prescription Yes Yes No No Yes   Weight 3 3  -  - 3lbs   Reps 10-15 10-15  -  - 10-15   Time 10 Minutes 10 Minutes  -  - 10 Minutes     Interval Training   Interval Training No No No No No     Treadmill   MPH 2.4 2.4 2.4 2.4 2.5   Grade 2 2 2 2 2    Minutes 10 10 10 10 10    METs 3.5 3.5 3.5 3.5 3.6     Arm/Foot Ergometer  Level 4 4.5 5 5  6.5   Minutes 10 10 10 10 10    METs 2.1 2.5 2.1 2.1  -     T5 Nustep   Level 5 5 5 5 5    SPM 80 80 80 80 80   Minutes 20 20 20 10 10    METs 2.8 2.5 2.8 2.6 2.8     Home Exercise Plan   Plans to continue exercise at Home (comment) Home (comment) Home (comment) Home (comment) Home (comment)   Frequency Add 3 additional days to program exercise sessions. Add 3 additional days to program exercise sessions. Add 3 additional days to program exercise sessions. Add 3 additional days to program exercise sessions. Add 3 additional days to program exercise sessions.   Initial Home Exercises Provided 07/25/17 07/25/17 07/25/17 07/25/17 07/25/17      Exercise Comments:     Exercise Comments    Row Name 09/05/17 1458 09/19/17 1118 09/24/17 0948 10/03/17 1000 10/17/17 0945   Exercise Comments Making steady progress with exercise. Reviewed METs with patient. Patient is tolerating mild to moderate intensity exercise well. Increasing workloads as able. Reviewed METs and goals with patient. Reviewed METs and goals with patient.   [place] Name 10/24/17 [phone]           Exercise Comments Reviewed goals with patient.          Exercise Goals and Review:   Exercise Goals Re-Evaluation :     Exercise Goals Re-Evaluation    Row Name 09/05/17 1458 09/19/17 1119 09/24/17 0948 10/03/17 1000 10/17/17 0945     Exercise Goal Re-Evaluation   Exercise Goals  Review Increase Physical Activity;Increase Strength and Stamina Knowledge and understanding of Target Heart Rate Range (THRR);Able to understand and use rate of perceived exertion (RPE) scale Able to understand and use rate of perceived exertion (RPE) scale;Increase Physical Activity Increase Physical Activity Increase Physical Activity   Comments Pt is making steady progress with exercise. Increasing workloads as tolerated. Reviewed THRR and RPE scale use with patient. Pt has been out a few weeks, increasing workloads as tolerated. Patient has missed some sessions of CR for personal routine. Increasing workloads as able. Patient states this is the first time he's been able to maintain his TM speed at 2.4/2 for the entire 10 minutes station. Encouraged patient to add one additional day of walking to supplement exercise at cardiac rehab, and pt is agreeable to this. Patient is slowly progressing with exercise at cardiac rehab. Pt states he's not walking at home in addition to cardiac rehab. Encouraged pt to add 1 additional day of exercise at home, and pt is agreeable to this.   Expected Outcomes Continue exercise routine to help achieve fitness and weight loss goals. Continue exercise routine to help achieve fitness and weight loss goals. Maintain a more consistent exercise routine at least 3-4 days/week. Add at least one other day of walking 30 minutes at home in addition to exercise at cardiac rehab. Increasing workloads as tolerated.   [place] Name 10/24/17 0949             Exercise Goal Re-Evaluation   Exercise Goals Review Increase Strength and Stamina       Comments Patient says he definitely feels stronger, stamina better.        Expected Outcomes Walk 30 minutes at least 1 day in addition to CR.           Discharge Exercise Prescription (Final Exercise Prescription Changes):     Exercise  Prescription Changes - 10/24/17 1100      Response to Exercise   Blood Pressure (Admit) 116/82   Blood  Pressure (Exercise) 124/82   Blood Pressure (Exit) 104/70   Heart Rate (Admit) 85 bpm   Heart Rate (Exercise) 120 bpm   Heart Rate (Exit) 82 bpm   Rating of Perceived Exertion (Exercise) 12   Symptoms none   Duration Progress to 45 minutes of aerobic exercise without signs/symptoms of physical distress   Intensity THRR unchanged     Progression   Progression Continue to progress workloads to maintain intensity without signs/symptoms of physical distress.   Average METs 3.2     Resistance Training   Training Prescription Yes   Weight 3lbs   Reps 10-15   Time 10 Minutes     Interval Training   Interval Training No     Treadmill   MPH 2.5   Grade 2   Minutes 10   METs 3.6     Arm/Foot Ergometer   Level 6.5   Minutes 10     T5 Nustep   Level 5   SPM 80   Minutes 10   METs 2.8     Home Exercise Plan   Plans to continue exercise at Home (comment)   Frequency Add 3 additional days to program exercise sessions.   Initial Home Exercises Provided 07/25/17      Nutrition:  Target Goals: Understanding of nutrition guidelines, daily intake of sodium 1500mg , cholesterol 200mg , calories 30% from fat and 7% or less from saturated fats, daily to have 5 or more servings of fruits and vegetables.  Biometrics:    Nutrition Therapy Plan and Nutrition Goals:   Nutrition Discharge: Nutrition Scores:   Nutrition Goals Re-Evaluation:   Nutrition Goals Re-Evaluation:   Nutrition Goals Discharge (Final Nutrition Goals Re-Evaluation):   Psychosocial: Target Goals: Acknowledge presence or absence of significant depression and/or stress, maximize coping skills, provide positive support system. Participant is able to verbalize types and ability to use techniques and skills needed for reducing stress and depression.  Initial Review & Psychosocial Screening:   Quality of Life Scores:   PHQ-9: Recent Review Flowsheet Data    Depression screen [facility] 2/9 07/11/2017    Decreased Interest 0   Down, Depressed, Hopeless 0   PHQ - 2 Score 0     Interpretation of Total Score  Total Score Depression Severity:  1-4 = Minimal depression, 5-9 = Mild depression, 10-14 = Moderate depression, 15-19 = Moderately severe depression, 20-27 = Severe depression   Psychosocial Evaluation and Intervention:   Psychosocial Re-Evaluation:     Psychosocial Re-Evaluation    Row Name 09/06/17 1022 10/03/17 1643 10/03/17 1646 10/26/17 1720       Psychosocial Re-Evaluation   Current issues with Current Stress Concerns Current Stress Concerns Current Stress Concerns Current Stress Concerns    Comments -  Wiiliam continues to assist in the care of his wife when needed Ismeal does well with exercise. Casanova continues to have stress concerns with his wife health and other family memebers Kais does well with exercise. Ruffus continues to have stress concerns with his wife health and other family memebers Neythan does well with exercise. Abbas continues to have stress concerns with his wife health and other family memebers    Interventions Encouraged to attend Cardiac Rehabilitation for the exercise;Relaxation education  - Encouraged to attend Cardiac Rehabilitation for the exercise;Relaxation education Encouraged to attend Cardiac Rehabilitation for the exercise;Relaxation education  Continue Psychosocial Services  No Follow up required No Follow up required No Follow up required No Follow up required      Initial Review   Source of Stress Concerns Family Family  - Family       Psychosocial Discharge (Final Psychosocial Re-Evaluation):     Psychosocial Re-Evaluation - 10/26/17 1720      Psychosocial Re-Evaluation   Current issues with Current Stress Concerns   Comments Bacilio does well with exercise. Kalik continues to have stress concerns with his wife health and other family memebers   Interventions Encouraged to attend Cardiac Rehabilitation for the  exercise;Relaxation education   Continue Psychosocial Services  No Follow up required     Initial Review   Source of Stress Concerns Family      Vocational Rehabilitation: Provide vocational rehab assistance to qualifying candidates.   Vocational Rehab Evaluation & Intervention:   Education: Education Goals: Education classes will be provided on a weekly basis, covering required topics. Participant will state understanding/return demonstration of topics presented.  Learning Barriers/Preferences:   Education Topics: Count Your Pulse:  -Group instruction provided by verbal instruction, demonstration, patient participation and written materials to support subject.  Instructors address importance of being able to find your pulse and how to count your pulse when at home without a heart monitor.  Patients get hands on experience counting their pulse with staff help and individually.   Heart Attack, Angina, and Risk Factor Modification:  -Group instruction provided by verbal instruction, video, and written materials to support subject.  Instructors address signs and symptoms of angina and heart attacks.    Also discuss risk factors for heart disease and how to make changes to improve heart health risk factors.   Functional Fitness:  -Group instruction provided by verbal instruction, demonstration, patient participation, and written materials to support subject.  Instructors address safety measures for doing things around the house.  Discuss how to get up and down off the floor, how to pick things up properly, how to safely get out of a chair without assistance, and balance training.   CARDIAC REHAB PHASE II EXERCISE from 10/03/2017 in [place]  Date  07/13/17  Instruction Review Code  2- meets goals/outcomes      Meditation and Mindfulness:  -Group instruction provided by verbal instruction, patient participation, and written materials to support  subject.  Instructor addresses importance of mindfulness and meditation practice to help reduce stress and improve awareness.  Instructor also leads participants through a meditation exercise.    Stretching for Flexibility and Mobility:  -Group instruction provided by verbal instruction, patient participation, and written materials to support subject.  Instructors lead participants through series of stretches that are designed to increase flexibility thus improving mobility.  These stretches are additional exercise for major muscle groups that are typically performed during regular warm up and cool down.   CARDIAC REHAB PHASE II EXERCISE from 10/03/2017 in [place]  Date  07/20/17  Instruction Review Code  2- meets goals/outcomes      Hands Only CPR:  -Group verbal, video, and participation provides a basic overview of AHA guidelines for community CPR. Role-play of emergencies allow participants the opportunity to practice calling for help and chest compression technique with discussion of AED use.   Hypertension: -Group verbal and written instruction that provides a basic overview of hypertension including the most recent diagnostic guidelines, risk factor reduction with self-care instructions and medication management.  Nutrition I class: Heart Healthy Eating:  -Group instruction provided by PowerPoint slides, verbal discussion, and written materials to support subject matter. The instructor gives an explanation and review of the Therapeutic Lifestyle Changes diet recommendations, which includes a discussion on lipid goals, dietary fat, sodium, fiber, plant stanol/sterol esters, sugar, and the components of a well-balanced, healthy diet.   CARDIAC REHAB PHASE II EXERCISE from 10/03/2017 in [place]  Date  08/06/17  Educator  RD  Instruction Review Code  Not applicable      Nutrition II class: Lifestyle Skills:   -Group instruction provided by PowerPoint slides, verbal discussion, and written materials to support subject matter. The instructor gives an explanation and review of label reading, grocery shopping for heart health, heart healthy recipe modifications, and ways to make healthier choices when eating out.   CARDIAC REHAB PHASE II EXERCISE from 10/03/2017 in [place]  Date  08/06/17  Educator  RD  Instruction Review Code  Not applicable      Diabetes Question & Answer:  -Group instruction provided by PowerPoint slides, verbal discussion, and written materials to support subject matter. The instructor gives an explanation and review of diabetes co-morbidities, pre- and post-prandial blood glucose goals, pre-exercise blood glucose goals, signs, symptoms, and treatment of hypoglycemia and hyperglycemia, and foot care basics.   Diabetes Blitz:  -Group instruction provided by PowerPoint slides, verbal discussion, and written materials to support subject matter. The instructor gives an explanation and review of the physiology behind type 1 and type 2 diabetes, diabetes medications and rational behind using different medications, pre- and post-prandial blood glucose recommendations and Hemoglobin A1c goals, diabetes diet, and exercise including blood glucose guidelines for exercising safely.    Portion Distortion:  -Group instruction provided by PowerPoint slides, verbal discussion, written materials, and food models to support subject matter. The instructor gives an explanation of serving size versus portion size, changes in portions sizes over the last 20 years, and what consists of a serving from each food group.   CARDIAC REHAB PHASE II EXERCISE from 10/03/2017 in [place]  Date  08/15/17  Educator  RD  Instruction Review Code  2- meets goals/outcomes      Stress Management:  -Group instruction provided by verbal instruction,  video, and written materials to support subject matter.  Instructors review role of stress in heart disease and how to cope with stress positively.     Exercising on Your Own:  -Group instruction provided by verbal instruction, power point, and written materials to support subject.  Instructors discuss benefits of exercise, components of exercise, frequency and intensity of exercise, and end points for exercise.  Also discuss use of nitroglycerin and activating EMS.  Review options of places to exercise outside of rehab.  Review guidelines for sex with heart disease.   CARDIAC REHAB PHASE II EXERCISE from 10/03/2017 in [place]  Date  08/29/17  Instruction Review Code  2- meets goals/outcomes      Cardiac Drugs I:  -Group instruction provided by verbal instruction and written materials to support subject.  Instructor reviews cardiac drug classes: antiplatelets, anticoagulants, beta blockers, and statins.  Instructor discusses reasons, side effects, and lifestyle considerations for each drug class.   Cardiac Drugs II:  -Group instruction provided by verbal instruction and written materials to support subject.  Instructor reviews cardiac drug classes: angiotensin converting enzyme inhibitors (ACE-I), angiotensin II receptor blockers (ARBs),  nitrates, and calcium channel blockers.  Instructor discusses reasons, side effects, and lifestyle considerations for each drug class.   CARDIAC REHAB PHASE II EXERCISE from 10/03/2017 in [place]  Date  10/03/17  Instruction Review Code  2- meets goals/outcomes      Anatomy and Physiology of the Circulatory System:  Group verbal and written instruction and models provide basic cardiac anatomy and physiology, with the coronary electrical and arterial systems. Review of: AMI, Angina, Valve disease, Heart Failure, Peripheral Artery Disease, Cardiac Arrhythmia, Pacemakers, and the ICD.    CARDIAC REHAB PHASE II EXERCISE from 10/03/2017 in [place]  Date  07/25/17  Instruction Review Code  2- meets goals/outcomes      Other Education:  -Group or individual verbal, written, or video instructions that support the educational goals of the cardiac rehab program.   Knowledge Questionnaire Score:   Core Components/Risk Factors/Patient Goals at Admission:   Core Components/Risk Factors/Patient Goals Review:      Goals and Risk Factor Review    Row Name 09/06/17 1018 09/06/17 1019 10/03/17 1643 10/26/17 1720       Core Components/Risk Factors/Patient Goals Review   Personal Goals Review Weight Management/Obesity Hypertension;Stress;Weight Management/Obesity Hypertension;Stress;Weight Management/Obesity Hypertension;Stress;Weight Management/Obesity    Review  - Chia's vital signs have been stable at exercise. Ammar has maintianed a steady weight. Torsten's vital signs have been stable at exercise. Devaunte has maintianed a steady weight. Lijah's vital signs have been stable at exercise. Ettore has maintianed a steady weight.    Expected Outcomes  - Moishe will continue to attend cardiac rehab for health benifits. Will continue to offer emotional supoort for stress managment. Jacquez will continue to attend cardiac rehab for health benifits. Will continue to offer emotional supoort for stress managment. Jeriah will continue to attend cardiac rehab for health benifits. Will continue to offer emotional supoort for stress managment.       Core Components/Risk Factors/Patient Goals at Discharge (Final Review):      Goals and Risk Factor Review - 10/26/17 1720      Core Components/Risk Factors/Patient Goals Review   Personal Goals Review Hypertension;Stress;Weight Management/Obesity   Review Bazil's vital signs have been stable at exercise. Marisa has maintianed a steady weight.   Expected Outcomes Shion will continue to attend  cardiac rehab for health benifits. Will continue to offer emotional supoort for stress managment.      ITP Comments:   Comments: Clara is making expected progress toward personal goals after completing 29 sessions. Recommend continued exercise and life style modification education including  stress management and relaxation techniques to decrease cardiac risk profile. Gardner has had some stress dealing with his wife health issues and is concerned about his dialysis access site. Verdon will be absent on Monday for testing regarding this.Napolean continues to enjoy participating in phase 2 cardiac rehab.Barnet Pall, RN,BSN 10/26/2017 5:24 PM

## 2017-10-29 ENCOUNTER — Encounter: Payer: Self-pay | Admitting: Surgery

## 2017-10-29 ENCOUNTER — Ambulatory Visit: Payer: Medicare Other | Admitting: Surgery

## 2017-10-29 ENCOUNTER — Ambulatory Visit
Admission: RE | Admit: 2017-10-29 | Discharge: 2017-10-29 | Disposition: A | Discharge status: Home / Self Care | Payer: Medicare Other | Source: Ambulatory Visit | Attending: Surgery | Admitting: Surgery

## 2017-10-29 VITALS — BP 143/98 | HR 79 | Temp 98.2°F | Resp 20 | Ht 73.0 in | Wt 368.0 lb

## 2017-10-29 DIAGNOSIS — I714 Abdominal aortic aneurysm, without rupture, unspecified: Secondary | ICD-10-CM

## 2017-10-29 DIAGNOSIS — K8020 Calculus of gallbladder without cholecystitis without obstruction: Secondary | ICD-10-CM | POA: Diagnosis not present

## 2017-10-29 DIAGNOSIS — N184 Chronic kidney disease, stage 4 (severe): Secondary | ICD-10-CM

## 2017-10-29 NOTE — Progress Notes (Signed)
Vascular and Vein [facility]ist of [place]  Patient name: [name] MRN: [id number] DOB: 13-Jan-1971 Sex: male   REASON FOR VISIT:    Follow up  [place]:    Emmanuell Kantz Witherspoonis a 46 y.o.malewho is a former patient of Dr. Kellie Simmering. He is status post percutaneous endovascular aneurysm repair of a 5.6centimeter infrarenal abdominal aortic aneurysm secondary to a chronic dissection on 09/25/2012. He also has stage IV chronic renal insufficiency, status post left radiocephalic fistula creation. He is not yet on dialysis. His most recent noncontrast CT scan has showed a slight increase in the size of his aneurysm. He also is being evaluated for a 5.9 cm ascending aortic aneurysm  He ended up having aortic arch and valve surgery at [facility] earlier this year.  He is not yet on dialysis.  He is concerned about the size of his fistula.  PAST MEDICAL HISTORY:   Past Medical History:  Diagnosis Date  . Abdominal aortic aneurysm ([place])   . Anemia   . Anxiety   . Aortic regurgitation 12/10/08   Echo: mild to mod. AOV regurg,mild AO root dilatation, LA mildly dilated, EF 65%, LV wall thickness markedly increased, small PE  . Chronic kidney disease   . Chronic renal disease    advanced  . DVT (deep venous thrombosis) ([place])   . H/O aortic dissection 2009   infarenal abdominal aortic dissection  . History of pulmonary embolus (PE) 2010  . Hypertension    dr [name]  . Left ventricular hypertrophy   . Obesity   . Stroke [facility]) 2009   pt says no stroke     FAMILY HISTORY:   Family History  Problem Relation Age of Onset  . Hypertension Mother   . Hypertension Father     SOCIAL HISTORY:   Social History   Tobacco Use  . Smoking status: Never Smoker  . Smokeless tobacco: Never Used  Substance Use Topics  . Alcohol use: No    Alcohol/week: 0.0 oz     ALLERGIES:   Allergies  Allergen Reactions  .  Roxicodone [Oxycodone Hcl] Hives and Itching    Hives and itching all over     CURRENT MEDICATIONS:   Current Outpatient Medications  Medication Sig Dispense Refill  . acetaminophen (TYLENOL) 500 MG tablet Take 1,000 mg by mouth every 6 (six) hours as needed for moderate pain or headache. Reported on 12/14/2015    . allopurinol (ZYLOPRIM) 300 MG tablet TAKE 1 TABLET BY MOUTH  DAILY (Patient taking differently: TAKE 1/2 TABLET BY MOUTH  DAILY) 90 tablet 1  . apixaban (ELIQUIS) 2.5 MG TABS tablet Take 1 tablet (2.5 mg total) by mouth 2 (two) times daily. 28 tablet 0  . aspirin 81 MG chewable tablet Chew 81 mg by mouth daily.    [name] atorvastatin (LIPITOR) 10 MG tablet TAKE 1 TABLET BY MOUTH  DAILY 90 tablet 2  . calcitRIOL (ROCALTROL) 0.25 MCG capsule TAKE 1 CAPSULE BY MOUTH  EVERY MONDAY, WEDNESDAY,  AND FRIDAY. 24 capsule 4  . carvedilol (COREG) 25 MG tablet TAKE 1 TABLET BY MOUTH TWO  TIMES DAILY 180 tablet 1  . cetirizine (ZYRTEC) 10 MG tablet Take 10 mg by mouth daily.    . furosemide (LASIX) 40 MG tablet TAKE 1 TABLET BY MOUTH  DAILY 90 tablet 0  . Olmesartan-Amlodipine-HCTZ 40-10-12.5 MG TABS TAKE 1 TABLET BY MOUTH  DAILY 90 tablet 1  . sennosides-docusate sodium (SENOKOT-S) 8.6-50 MG tablet Take 1 tablet  by mouth 2 (two) times daily.     No current facility-administered medications for this visit.     REVIEW OF SYSTEMS:   [X]  denotes positive finding, [ ]  denotes negative finding Cardiac  Comments:  Chest pain or chest pressure:    Shortness of breath upon exertion:    Short of breath when lying flat:    Irregular heart rhythm:        Vascular    Pain in calf, thigh, or hip brought on by ambulation:    Pain in feet at night that wakes you up from your sleep:     Blood clot in your veins:    Leg swelling:         Pulmonary    Oxygen at home:    Productive cough:     Wheezing:         Neurologic    Sudden weakness in arms or legs:     Sudden numbness in arms or legs:      Sudden onset of difficulty speaking or slurred speech:    Temporary loss of vision in one eye:     Problems with dizziness:         Gastrointestinal    Blood in stool:     Vomited blood:         Genitourinary    Burning when urinating:     Blood in urine:        Psychiatric    Major depression:         Hematologic    Bleeding problems:    Problems with blood clotting too easily:        Skin    Rashes or ulcers:        Constitutional    Fever or chills:      PHYSICAL EXAM:   Vitals:   10/29/17 1157  BP: (!) 143/98  Pulse: 79  Resp: 20  Temp: 98.2 F (36.8 C)  TempSrc: Oral  SpO2: 97%  Weight: (!) 368 lb (166.9 kg)  Height: 6\' 1"  (1.854 m)    GENERAL: The patient is a well-nourished male, in no acute distress. The vital signs are documented above. CARDIAC: There is a regular rate and rhythm.  VASCULAR: Aneurysmal left radiocephalic fistula.  No skin breakdown. PULMONARY: Non-labored respirations ABDOMEN: Soft and non-tender with normal pitched bowel sounds.  MUSCULOSKELETAL: There are no major deformities or cyanosis. NEUROLOGIC: No focal weakness or paresthesias are detected.[name] PSYCHIATRIC: The patient has a normal affect.  STUDIES:   I have reviewed his CT scan with the following findings: 1. Stable appearance of the aortoiliac stent graft. No complicating features are demonstrated. 2. Stable size of the native aortic aneurysm. 3. No acute abdominal/pelvic findings, mass lesions or lymphadenopathy. 4. Cholelithiasis.  Max diameter 4.6  MEDICAL ISSUES:   AAA:  The aneurysm remains stable.  No repeat imaging and one year  CKD:  The patient is contemplating aneurysmorrhaphy of his very large radiocephalic fistula.  If he is not yet on dialysis I would likely have to make an incision from his wrist up to his elbow in order to ligate the entire fistula.  This potentially could be staged if he is on dialysis.  He is going to contact me if he wishes to have  this operation done.  He would need to be off of his anticoagulation and cleared with cardiology if we decide to proceed.    Annamarie Major, MD [facility] Specialists [facility]  Tel [phone] Pager [phone]

## 2017-10-31 ENCOUNTER — Encounter (HOSPITAL_COMMUNITY)
Admission: RE | Admit: 2017-10-31 | Discharge: 2017-10-31 | Disposition: A | Discharge status: Home / Self Care | Payer: Medicare Other | Source: Ambulatory Visit | Attending: Cardiovascular Disease | Admitting: Cardiovascular Disease

## 2017-10-31 DIAGNOSIS — Z952 Presence of prosthetic heart valve: Secondary | ICD-10-CM | POA: Diagnosis not present

## 2017-10-31 DIAGNOSIS — Z8679 Personal history of other diseases of the circulatory system: Secondary | ICD-10-CM | POA: Diagnosis not present

## 2017-10-31 DIAGNOSIS — Z8673 Personal history of transient ischemic attack (TIA), and cerebral infarction without residual deficits: Secondary | ICD-10-CM | POA: Insufficient documentation

## 2017-10-31 DIAGNOSIS — Z9889 Other specified postprocedural states: Secondary | ICD-10-CM | POA: Diagnosis not present

## 2017-10-31 DIAGNOSIS — N189 Chronic kidney disease, unspecified: Secondary | ICD-10-CM | POA: Insufficient documentation

## 2017-10-31 DIAGNOSIS — Z79899 Other long term (current) drug therapy: Secondary | ICD-10-CM | POA: Insufficient documentation

## 2017-10-31 DIAGNOSIS — I129 Hypertensive chronic kidney disease with stage 1 through stage 4 chronic kidney disease, or unspecified chronic kidney disease: Secondary | ICD-10-CM | POA: Diagnosis not present

## 2017-10-31 DIAGNOSIS — I719 Aortic aneurysm of unspecified site, without rupture: Secondary | ICD-10-CM | POA: Diagnosis not present

## 2017-11-02 ENCOUNTER — Encounter (HOSPITAL_COMMUNITY)
Admission: RE | Admit: 2017-11-02 | Discharge: 2017-11-02 | Disposition: A | Discharge status: Home / Self Care | Payer: Medicare Other | Source: Ambulatory Visit | Attending: Cardiovascular Disease | Admitting: Cardiovascular Disease

## 2017-11-02 DIAGNOSIS — I129 Hypertensive chronic kidney disease with stage 1 through stage 4 chronic kidney disease, or unspecified chronic kidney disease: Secondary | ICD-10-CM | POA: Diagnosis not present

## 2017-11-02 DIAGNOSIS — Z8679 Personal history of other diseases of the circulatory system: Secondary | ICD-10-CM | POA: Diagnosis not present

## 2017-11-02 DIAGNOSIS — N189 Chronic kidney disease, unspecified: Secondary | ICD-10-CM | POA: Diagnosis not present

## 2017-11-02 DIAGNOSIS — Z8673 Personal history of transient ischemic attack (TIA), and cerebral infarction without residual deficits: Secondary | ICD-10-CM | POA: Diagnosis not present

## 2017-11-02 DIAGNOSIS — Z952 Presence of prosthetic heart valve: Secondary | ICD-10-CM

## 2017-11-02 DIAGNOSIS — Z9889 Other specified postprocedural states: Secondary | ICD-10-CM | POA: Diagnosis not present

## 2017-11-02 DIAGNOSIS — Z79899 Other long term (current) drug therapy: Secondary | ICD-10-CM | POA: Diagnosis not present

## 2017-11-02 DIAGNOSIS — I719 Aortic aneurysm of unspecified site, without rupture: Secondary | ICD-10-CM | POA: Diagnosis not present

## 2017-11-05 ENCOUNTER — Encounter (HOSPITAL_COMMUNITY)
Admission: RE | Admit: 2017-11-05 | Discharge: 2017-11-05 | Disposition: A | Discharge status: Home / Self Care | Payer: Medicare Other | Source: Ambulatory Visit | Attending: Cardiovascular Disease | Admitting: Cardiovascular Disease

## 2017-11-05 DIAGNOSIS — Z8673 Personal history of transient ischemic attack (TIA), and cerebral infarction without residual deficits: Secondary | ICD-10-CM | POA: Diagnosis not present

## 2017-11-05 DIAGNOSIS — Z8679 Personal history of other diseases of the circulatory system: Secondary | ICD-10-CM | POA: Diagnosis not present

## 2017-11-05 DIAGNOSIS — Z79899 Other long term (current) drug therapy: Secondary | ICD-10-CM | POA: Diagnosis not present

## 2017-11-05 DIAGNOSIS — I129 Hypertensive chronic kidney disease with stage 1 through stage 4 chronic kidney disease, or unspecified chronic kidney disease: Secondary | ICD-10-CM | POA: Diagnosis not present

## 2017-11-05 DIAGNOSIS — I719 Aortic aneurysm of unspecified site, without rupture: Secondary | ICD-10-CM | POA: Diagnosis not present

## 2017-11-05 DIAGNOSIS — Z9889 Other specified postprocedural states: Secondary | ICD-10-CM

## 2017-11-05 DIAGNOSIS — Z952 Presence of prosthetic heart valve: Secondary | ICD-10-CM | POA: Diagnosis not present

## 2017-11-05 DIAGNOSIS — N189 Chronic kidney disease, unspecified: Secondary | ICD-10-CM | POA: Diagnosis not present

## 2017-11-06 ENCOUNTER — Telehealth: Payer: Self-pay | Admitting: Internal Medicine

## 2017-11-06 NOTE — Telephone Encounter (Signed)
Copied from [place] [id number]. Topic: General - Other >> Nov 06, 2017 11:01 AM Carolyn Stare wrote: Reason for CRM:  pt call to ask if we had samples of any Eliquis wouldl ike a call back   [phone]

## 2017-11-06 NOTE — Telephone Encounter (Signed)
Called pt to advise that sadly we do NOT have any sample of Eliquis. Pt advised and voiced understanding.

## 2017-11-07 ENCOUNTER — Encounter (HOSPITAL_COMMUNITY)
Admission: RE | Admit: 2017-11-07 | Discharge: 2017-11-07 | Disposition: A | Discharge status: Home / Self Care | Payer: Medicare Other | Source: Ambulatory Visit | Attending: Cardiovascular Disease | Admitting: Cardiovascular Disease

## 2017-11-07 VITALS — BP 122/70 | HR 88 | Ht 72.0 in | Wt 371.5 lb

## 2017-11-07 DIAGNOSIS — Z79899 Other long term (current) drug therapy: Secondary | ICD-10-CM | POA: Diagnosis not present

## 2017-11-07 DIAGNOSIS — Z8673 Personal history of transient ischemic attack (TIA), and cerebral infarction without residual deficits: Secondary | ICD-10-CM | POA: Diagnosis not present

## 2017-11-07 DIAGNOSIS — Z8679 Personal history of other diseases of the circulatory system: Secondary | ICD-10-CM | POA: Diagnosis not present

## 2017-11-07 DIAGNOSIS — Z9889 Other specified postprocedural states: Secondary | ICD-10-CM | POA: Diagnosis not present

## 2017-11-07 DIAGNOSIS — N189 Chronic kidney disease, unspecified: Secondary | ICD-10-CM | POA: Diagnosis not present

## 2017-11-07 DIAGNOSIS — Z952 Presence of prosthetic heart valve: Secondary | ICD-10-CM

## 2017-11-07 DIAGNOSIS — I129 Hypertensive chronic kidney disease with stage 1 through stage 4 chronic kidney disease, or unspecified chronic kidney disease: Secondary | ICD-10-CM | POA: Diagnosis not present

## 2017-11-07 DIAGNOSIS — I719 Aortic aneurysm of unspecified site, without rupture: Secondary | ICD-10-CM | POA: Diagnosis not present

## 2017-11-07 NOTE — Progress Notes (Signed)
Discharge Progress Report  Patient Details  Name: [name] MRN: [id number] Date of Birth: 1971/08/05 Referring Provider:     CARDIAC REHAB PHASE II ORIENTATION from 07/05/2017 in [place]  Referring Provider  Croitoru, Mihai MD       Number of Visits: 2  Reason for Discharge:  Patient independent in their exercise.  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 along with repair of aortic valve and aortic ring annuloplasty 05/07/2017  ADL UCSD:   Initial Exercise Prescription: Initial Exercise Prescription - 07/05/17 1200      Date of Initial Exercise RX and Referring Provider   Date  07/05/17    Referring Provider  Croitoru, Mihai MD      Treadmill   MPH  1.7    Grade  1    Minutes  10    METs  2      T5 Nustep   Level  3    SPM  80    Minutes  20    METs  2      Prescription Details   Frequency (times per week)  3    Duration  Progress to 45 minutes of aerobic exercise without signs/symptoms of physical distress      Intensity   THRR 40-80% of Max Heartrate  69-138    Ratings of Perceived Exertion  11-13    Perceived Dyspnea  0-4      Progression   Progression  Continue to progress workloads to maintain intensity without signs/symptoms of physical distress.      Resistance Training   Training Prescription  Yes    Weight  3    Reps  10-15       Discharge Exercise Prescription (Final Exercise Prescription Changes): Exercise Prescription Changes - 11/07/17 1200      Response to Exercise   Blood Pressure (Admit)  122/70 11/07/17 0949    Blood Pressure (Exercise)  120/80    Blood Pressure (Exit)  114/68    Heart Rate (Admit)  88 bpm    Heart Rate (Exercise)  117 bpm    Heart Rate (Exit)  86 bpm    Rating of Perceived Exertion (Exercise)  12    Symptoms  none    Duration  Continue with 45 min of aerobic exercise without signs/symptoms of physical distress.    Intensity  THRR unchanged      Progression   Progression  Continue to progress workloads to maintain intensity without signs/symptoms of physical distress.    Average METs  3.2      Resistance Training   Training Prescription  Yes    Weight  3lbs    Reps  10-15    Time  10 Minutes      Interval Training   Interval Training  No      Treadmill   MPH  2.5    Grade  2    Minutes  10    METs  3.6      Arm/Foot Ergometer   Level  6.5    Minutes  10    METs  2.2      T5 Nustep   Level  5    SPM  90    Minutes  10    METs  3.7  Home Exercise Plan   Plans to continue exercise at  [facility] (comment)    Frequency  Add 3 additional days to program exercise sessions.    Initial Home Exercises Provided  07/25/17       Functional Capacity: 6 Minute Walk    Row Name 07/05/17 1034 10/31/17 1002       6 Minute Walk   Phase  Initial  Discharge    Distance  1345 feet  1561 feet    Distance % Change  -  16.06 %    Walk Time  6 minutes  6 minutes    # of Rest Breaks  0  0    MPH  2.55  2.96    METS  3.21  3.24    RPE  13  10    VO2 Peak  11.24  11.34    Symptoms  No  No    Resting HR  99 bpm  79 bpm    Resting BP  124/64  108/70    Max Ex. HR  128 bpm  117 bpm    Max Ex. BP  146/80  128/76       Psychological, QOL, Others - Outcomes: PHQ 2/9: Depression screen [facility] 2/9 11/07/2017 07/11/2017  Decreased Interest 0 0  Down, Depressed, Hopeless 0 0  PHQ - 2 Score 0 0  Some recent data might be hidden    Quality of Life: Quality of Life - 11/05/17 1728      Quality of Life Scores   Health/Function Pre  19.11 %    Health/Function Post  25.93 %    Health/Function % Change  35.69 %    Socioeconomic Pre  18 %    Socioeconomic Post  23.43 %    Socioeconomic % Change   30.17 %    Psych/Spiritual Pre  24 %    Psych/Spiritual Post  24.86 %      Psych/Spiritual % Change  3.58 %    Family Pre  12 %    Family Post  23.63 %    Family % Change  96.92 %    GLOBAL Pre  19.05 %    GLOBAL Post  24.86 %    GLOBAL % Change  30.5 %       Personal Goals: Goals established at orientation with interventions provided to work toward goal. Personal Goals and Risk Factors at Admission - 07/05/17 1408      Core Components/Risk Factors/Patient Goals on Admission    Weight Management  Yes;Obesity;Weight Maintenance;Weight Loss    Intervention  Weight Management: Develop a combined nutrition and exercise program designed to reach desired caloric intake, while maintaining appropriate intake of nutrient and fiber, sodium and fats, and appropriate energy expenditure required for the weight goal.;Weight Management: Provide education and appropriate resources to help participant work on and attain dietary goals.;Weight Management/Obesity: Establish reasonable short term and long term weight goals.;Obesity: Provide education and appropriate resources to help participant work on and attain dietary goals.    Expected Outcomes  Short Term: Continue to assess and modify interventions until short term weight is achieved;Long Term: Adherence to nutrition and physical activity/exercise program aimed toward attainment of established weight goal;Weight Maintenance: Understanding of the daily nutrition guidelines, which includes 25-35% calories from fat, 7% or less cal from saturated fats, less than 272m cholesterol, less than 1.5gm of sodium, & 5 or more servings of fruits and vegetables daily;Weight Loss: Understanding of general recommendations for a balanced  deficit meal plan, which promotes 1-2 lb weight loss per week and includes a negative energy balance of [phone] kcal/d;Understanding recommendations for meals to include 15-35% energy as protein, 25-35% energy from fat, 35-60% energy from carbohydrates, less than 27m of dietary cholesterol, 20-35 gm of total  fiber daily;Understanding of distribution of calorie intake throughout the day with the consumption of 4-5 meals/snacks    Hypertension  Yes    Intervention  Provide education on lifestyle modifcations including regular physical activity/exercise, weight management, moderate sodium restriction and increased consumption of fresh fruit, vegetables, and low fat dairy, alcohol moderation, and smoking cessation.;Monitor prescription use compliance.    Expected Outcomes  Short Term: Continued assessment and intervention until BP is < 140/929mHG in hypertensive participants. < 130/8078mG in hypertensive participants with diabetes, heart failure or chronic kidney disease.;Long Term: Maintenance of blood pressure at goal levels.    Stress  Yes    Intervention  Offer individual and/or small group education and counseling on adjustment to heart disease, stress management and health-related lifestyle change. Teach and support self-help strategies.;Refer participants experiencing significant psychosocial distress to appropriate mental health specialists for further evaluation and treatment. When possible, include family members and significant others in education/counseling sessions.    Expected Outcomes  Short Term: Participant demonstrates changes in health-related behavior, relaxation and other stress management skills, ability to obtain effective social support, and compliance with psychotropic medications if prescribed.;Long Term: Emotional wellbeing is indicated by absence of clinically significant psychosocial distress or social isolation.        Personal Goals Discharge: Goals and Risk Factor Review    Row Name 10/03/17 1643 10/26/17 1720 12/05/17 1357         Core Components/Risk Factors/Patient Goals Review   Personal Goals Review  Hypertension;Stress;Weight Management/Obesity  Hypertension;Stress;Weight Management/Obesity  Hypertension;Stress;Weight Management/Obesity     Review  Oshea's vital  signs have been stable at exercise. WilKeis maintianed a steady weight.  Desmond's vital signs have been stable at exercise. WilSohans maintianed a steady weight.  Daschel's vital signs have been stable at exercise. WilKoltend not meet his weight loss goal but hopes to remain acitve to loose weight     Expected Outcomes  WilProctorll continue to attend cardiac rehab for health benifits. Will continue to offer emotional supoort for stress managment.  WilAlandisll continue to attend cardiac rehab for health benifits. Will continue to offer emotional supoort for stress managment.  WilMarvelll continue to exercise on his own and follow a heart healthy diet.        Exercise Goals and Review: Exercise Goals    Row Name 07/05/17 0959             Exercise Goals   Increase Physical Activity  Yes       Intervention  Provide advice, education, support and counseling about physical activity/exercise needs.;Develop an individualized exercise prescription for aerobic and resistive training based on initial evaluation findings, risk stratification, comorbidities and participant's personal goals.       Expected Outcomes  Achievement of increased cardiorespiratory fitness and enhanced flexibility, muscular endurance and strength shown through measurements of functional capacity and personal statement of participant.       Increase Strength and Stamina  Yes       Intervention  Provide advice, education, support and counseling about physical activity/exercise needs.;Develop an individualized exercise prescription for aerobic and resistive training based on initial evaluation findings, risk stratification, comorbidities and participant's personal goals.  Expected Outcomes  Achievement of increased cardiorespiratory fitness and enhanced flexibility, muscular endurance and strength shown through measurements of functional capacity and personal statement of participant.          Nutrition & Weight -  Outcomes: Pre Biometrics - 07/05/17 1427      Pre Biometrics   Height  6' (1.829 m)    Weight  364 lb 3.2 oz (165.2 kg)  (Abnormal)     Waist Circumference  56.5 inches    Hip Circumference  69 inches    Waist to Hip Ratio  0.82 %    BMI (Calculated)  49.5    Triceps Skinfold  44 mm    % Body Fat  46.8 %    Grip Strength  38.5 kg    Flexibility  15.5 in    Single Leg Stand  12.6 seconds      Post Biometrics - 12/05/17 1348       Post  Biometrics   Height  6' (1.829 m)    Weight  371 lb 7.6 oz (168.5 kg)  (Abnormal)     Waist Circumference  57.5 inches    Hip Circumference  67 inches    Waist to Hip Ratio  0.86 %    BMI (Calculated)  50.37    Triceps Skinfold  44 mm    % Body Fat  47.6 %    Grip Strength  33.5 kg    Flexibility  15.5 in    Single Leg Stand  30 seconds       Nutrition: Nutrition Therapy & Goals - 07/05/17 1055      Nutrition Therapy   Diet  Renal, Heart Healthy    Drug/Food Interactions  Purine/Gout      Personal Nutrition Goals   Nutrition Goal  Pt to increase self efficacy re: combining Heart Healthy and Kidney diets.    Personal Goal #2  Pt to identify food quantities necessary to achieve weight loss of 6-24 lb (2.7-10.9 kg) at graduation from cardiac rehab.      Intervention Plan   Intervention  Prescribe, educate and counsel regarding individualized specific dietary modifications aiming towards targeted core components such as weight, hypertension, lipid management, diabetes, heart failure and other comorbidities.;Nutrition handout(s) given to patient. Potassium Content of Foods, Phosphorus Content of Foods, and Purine Restricted Nutrition Therapy    Expected Outcomes  Short Term Goal: Understand basic principles of dietary content, such as calories, fat, sodium, cholesterol and nutrients.;Long Term Goal: Adherence to prescribed nutrition plan.       Nutrition Discharge: Nutrition Assessments - 12/04/17 1528      MEDFICTS Scores   Pre Score   56    Post Score  42    Score Difference  -14       Education Questionnaire Score: Knowledge Questionnaire Score - 11/05/17 1728      Knowledge Questionnaire Score   Pre Score  22/24    Post Score  23/24       Goals reviewed with patient; copy given to patient. Taijon graduated from cardiac rehab program today with completion of 33 exercise sessions in Phase II. Pt maintained good attendance and progressed nicely during his participation in rehab as evidenced by increased MET level.   Medication list reconciled. Repeat  PHQ score-  0.  Sinai has made significant lifestyle changes and should be commended for his success. Pt feels he has achieved his goals during cardiac rehab except weight loss.   Pt plans  to continue exercise by walking on hiw own. Rexford enjoyed participating in phase 2 cardiac rehab but was not always able to come on a regular basis due to helping to care for his wife. Lynwood increased his distance on his post exercise walk test but did not lose weight. Othman hopes to continue to exercise and follow a heart healthy low sodium diet.Barnet Pall, RN,BSN 12/05/2017 2:00 PM

## 2017-11-07 NOTE — Addendum Note (Signed)
Addended by: Lianne Cure A on: 11/07/2017 10:42 AM   Modules accepted: Orders

## 2017-11-08 ENCOUNTER — Telehealth: Payer: Self-pay | Admitting: Cardiovascular Disease

## 2017-11-08 NOTE — Telephone Encounter (Signed)
Will forward to Dr Sallyanne Kuster to ask if pt may start using  Weights in work out .[name]

## 2017-11-08 NOTE — Telephone Encounter (Signed)
Per pt call he would like samples of Eliquis and wether or not he can start using weights in his work out routine.

## 2017-11-08 NOTE — Telephone Encounter (Signed)
Weight lifting is okay, with some limitations.  He is to avoid intense weight lifting that requires breath-holding or straining.  He needs to do multiple reps of the lower weights.  If he cannot do 10 consecutive reps while breathing regularly, it means the weight is too high and he needs to back off. MCr

## 2017-11-08 NOTE — Telephone Encounter (Signed)
Samples at the front desk  Pt notified-please advise about weight lifting, etc

## 2017-11-08 NOTE — Telephone Encounter (Signed)
Pt notified he will call back if anything else is needed

## 2017-11-22 ENCOUNTER — Telehealth: Payer: Self-pay | Admitting: Cardiovascular Disease

## 2017-11-22 ENCOUNTER — Encounter: Payer: Self-pay | Admitting: *Deleted

## 2017-11-22 NOTE — Progress Notes (Signed)
Patient has an appointment with Cardiology 12/13/17. Will need Cardiac Clearance and Medication Clearance regarding Eliquis to hold pre-op for Plication of A/V fistula. Surgery pending clearance. Basket  message sent and forms faxed to office.

## 2017-11-22 NOTE — Telephone Encounter (Signed)
Patient has been made aware that we are currently out of samples. He will call back.

## 2017-11-22 NOTE — Telephone Encounter (Signed)
New message    Patient calling the office for samples of medication:   1.  What medication and dosage are you requesting samples for?apixaban (ELIQUIS) 2.5 MG TABS tablet  2.  Are you currently out of this medication? NO

## 2017-11-27 ENCOUNTER — Telehealth: Payer: Self-pay

## 2017-11-27 ENCOUNTER — Telehealth: Payer: Self-pay | Admitting: Cardiovascular Disease

## 2017-11-27 ENCOUNTER — Telehealth: Payer: Self-pay | Admitting: Internal Medicine

## 2017-11-27 NOTE — Telephone Encounter (Signed)
Copied from [place] [id number]. Topic: Inquiry >> Nov 27, 2017 12:57 PM Oliver Pila B wrote: Reason for CRM: pt called to see if samples of eloquis is available, contact pt if possible

## 2017-11-27 NOTE — Telephone Encounter (Signed)
New message   Patient calling the office for samples of medication:   1.  What medication and dosage are you requesting samples for?apixaban (ELIQUIS) 2.5 MG TABS tablet  2.  Are you currently out of this medication? yes

## 2017-11-27 NOTE — Telephone Encounter (Signed)
Spoke to patient office just received Eliquis 5 mg samples.Advised to take 1/2 tablet twice a day.Samples left at [facility] office front desk.

## 2017-11-27 NOTE — Telephone Encounter (Signed)
Returned call to patient,office out of Eliquis samples.

## 2017-11-28 NOTE — Telephone Encounter (Signed)
Medication Samples have been provided to the patient to pick up from the front desk  Drug name: Eliquis      Strength: 5 mg        Qty: 2 boxes of 14 tabs LOT: [id number] Exp.Date: March 31,2021  Dosing instructions: Please ensure that you use a pill cutter to score tables in two, take 1/2 tablets once daily.  The patient has been instructed regarding the correct time, dose, and frequency of taking this medication, including desired effects and most common side effects.   [name] 11:40 AM 11/28/2017

## 2017-12-04 ENCOUNTER — Other Ambulatory Visit: Payer: Self-pay | Admitting: Cardiovascular Disease

## 2017-12-04 ENCOUNTER — Other Ambulatory Visit: Payer: Self-pay | Admitting: Internal Medicine

## 2017-12-04 NOTE — Addendum Note (Signed)
Encounter addended by: Jewel Baize, RD on: 12/04/2017 3:29 PM  Actions taken: Flowsheet data copied forward, Visit Navigator Flowsheet section accepted

## 2017-12-05 ENCOUNTER — Ambulatory Visit: Payer: Medicare Other | Admitting: Cardiology

## 2017-12-05 NOTE — Addendum Note (Signed)
Encounter addended by: Magda Kiel, RN on: 12/05/2017 2:03 PM  Actions taken: Flowsheet data copied forward, Visit Navigator Flowsheet section accepted, Sign clinical note

## 2017-12-13 ENCOUNTER — Encounter: Payer: Self-pay | Admitting: Cardiology

## 2017-12-13 ENCOUNTER — Ambulatory Visit: Payer: Medicare Other | Admitting: Cardiology

## 2017-12-13 ENCOUNTER — Other Ambulatory Visit: Payer: Self-pay | Admitting: *Deleted

## 2017-12-13 VITALS — BP 113/72 | HR 86 | Ht 72.0 in | Wt 366.2 lb

## 2017-12-13 DIAGNOSIS — Z86718 Personal history of other venous thrombosis and embolism: Secondary | ICD-10-CM | POA: Diagnosis not present

## 2017-12-13 DIAGNOSIS — I712 Thoracic aortic aneurysm, without rupture: Secondary | ICD-10-CM | POA: Diagnosis not present

## 2017-12-13 DIAGNOSIS — Z7901 Long term (current) use of anticoagulants: Secondary | ICD-10-CM | POA: Diagnosis not present

## 2017-12-13 DIAGNOSIS — I714 Abdominal aortic aneurysm, without rupture, unspecified: Secondary | ICD-10-CM

## 2017-12-13 DIAGNOSIS — Z01810 Encounter for preprocedural cardiovascular examination: Secondary | ICD-10-CM | POA: Diagnosis not present

## 2017-12-13 DIAGNOSIS — N184 Chronic kidney disease, stage 4 (severe): Secondary | ICD-10-CM | POA: Diagnosis not present

## 2017-12-13 DIAGNOSIS — Z0389 Encounter for observation for other suspected diseases and conditions ruled out: Secondary | ICD-10-CM | POA: Diagnosis not present

## 2017-12-13 DIAGNOSIS — I7121 Aneurysm of the ascending aorta, without rupture: Secondary | ICD-10-CM

## 2017-12-13 DIAGNOSIS — IMO0001 Reserved for inherently not codable concepts without codable children: Secondary | ICD-10-CM

## 2017-12-13 NOTE — Patient Instructions (Addendum)
Hold Eliquis 48 hours prior to surgery restart as soon as Dr says ok   Your physician recommends that you schedule a follow-up appointment with Dr.Croitoru   Monday 04/01/18 at 9:40 am

## 2017-12-13 NOTE — Progress Notes (Signed)
Patient called instructed to be at [place] at 6:30 am on 01/02/2018 for surgery . Instructed to hold Eliquis x 2 days per Dr. Sallyanne Kuster [facility] PA) No food or drink past MN, must have driver to go home and to follow the detailed instructions he receives from the hospital Pre -Admission testing department about this surgery.

## 2017-12-13 NOTE — Progress Notes (Signed)
12/13/2017 [name]   [birth year]  [id number]  Primary Physician Marletta Lor, MD Primary Cardiologist: Dr Sallyanne Kuster  HPI:  46 y/o morbidly obese AA male with a history of prior aortic aneurysm stent grafting in 2013 and s/p recent ascending aortic grafting at [facility]. Other problems include CRI-4, morbid obesity with BMI > 50 and suspected sleep apnea, and HTN. His surgery at [facility] went well, he was home on post op day 6. His renal function remained stable. His coronaries were normal (per his report) at his pre surgery coronary angiogram. Dr Ysidro Evert did mention the degree of valve degeneration and the pt's age suggested possibly a connective tissue disorder and correlation with clinical and genetic findings would be required. We last saw him in July 2018 and he was doing well.  He is in the offcie today for pre op clearance. 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 He has been on chronic anticoagulation for a history of DVT and PE. In the past her was on Xarelto but had bleeding complications and he was changed to low dose Eliquis Nov 2015. He says he has not had problems with PE or DVT "for years". He is doing well otherwise from a cardiac standpoint. He has declined  A sleep study in the past. He is under some stress at home, he and his wife are considering separation.      Current Outpatient Medications  Medication Sig Dispense Refill  . acetaminophen (TYLENOL) 500 MG tablet Take 1,000 mg by mouth every 6 (six) hours as needed for moderate pain or headache. Reported on 12/14/2015    . allopurinol (ZYLOPRIM) 300 MG tablet TAKE 1 TABLET BY MOUTH  DAILY 90 tablet 0  . apixaban (ELIQUIS) 2.5 MG TABS tablet Take 1 tablet (2.5 mg total) by mouth 2 (two) times daily. 28 tablet 0  . aspirin 81 MG chewable tablet Chew 81 mg by mouth daily.    [name] atorvastatin (LIPITOR) 10 MG tablet TAKE 1 TABLET BY MOUTH  DAILY 90 tablet 0  . calcitRIOL (ROCALTROL) 0.25 MCG capsule TAKE 1 CAPSULE BY  MOUTH  EVERY MONDAY, WEDNESDAY,  AND FRIDAY. 24 capsule 4  . carvedilol (COREG) 25 MG tablet TAKE 1 TABLET BY MOUTH TWO  TIMES DAILY 180 tablet 1  . cetirizine (ZYRTEC) 10 MG tablet Take 10 mg by mouth daily.    . furosemide (LASIX) 40 MG tablet TAKE 1 TABLET BY MOUTH  DAILY 90 tablet 0  . Olmesartan-Amlodipine-HCTZ 40-10-12.5 MG TABS TAKE 1 TABLET BY MOUTH  DAILY 90 tablet 1  . sennosides-docusate sodium (SENOKOT-S) 8.6-50 MG tablet Take 1 tablet by mouth 2 (two) times daily.     No current facility-administered medications for this visit.     Allergies  Allergen Reactions  . Roxicodone [Oxycodone Hcl] Hives and Itching    Hives and itching all over    Past Medical History:  Diagnosis Date  . Abdominal aortic aneurysm ([place])   . Anemia   . Anxiety   . Aortic regurgitation 12/10/08   Echo: mild to mod. AOV regurg,mild AO root dilatation, LA mildly dilated, EF 65%, LV wall thickness markedly increased, small PE  . Chronic kidney disease   . Chronic renal disease    advanced  . DVT (deep venous thrombosis) ([place])   . H/O aortic dissection 2009   infarenal abdominal aortic dissection  . History of pulmonary embolus (PE) 2010  . Hypertension    dr [name]  . Left  ventricular hypertrophy   . Obesity   . Stroke [facility]) 2009   pt says no stroke    Social History   Socioeconomic History  . Marital status: Married    Spouse name: Not on file  . Number of children: Not on file  . Years of education: Not on file  . Highest education level: Not on file  Social Needs  . Financial resource strain: Not on file  . Food insecurity - worry: Not on file  . Food insecurity - inability: Not on file  . Transportation needs - medical: Not on file  . Transportation needs - non-medical: Not on file  Occupational History  . Not on file  Tobacco Use  . Smoking status: Never Smoker  . Smokeless tobacco: Never Used  Substance and Sexual Activity  . Alcohol use: No    Alcohol/week: 0.0 oz  .  Drug use: No  . Sexual activity: Not Currently    Partners: Female  Other Topics Concern  . Not on file  Social History Narrative  . Not on file     Family History  Problem Relation Age of Onset  . Hypertension Mother   . Hypertension Father      Review of Systems: General: negative for chills, fever, night sweats or weight changes.  Cardiovascular: negative for chest pain, dyspnea on exertion, edema, orthopnea, palpitations, paroxysmal nocturnal dyspnea or shortness of breath Dermatological: negative for rash Respiratory: negative for cough or wheezing Urologic: negative for hematuria Abdominal: negative for nausea, vomiting, diarrhea, bright red blood per rectum, melena, or hematemesis Neurologic: negative for visual changes, syncope, or dizziness All other systems reviewed and are otherwise negative except as noted above.    Blood pressure 113/72, pulse 86, height 6' (1.829 m), weight (!) 366 lb 3.2 oz (166.1 kg).  General appearance: alert, cooperative and morbidly obese Lungs: clear to auscultation bilaterally Heart: regular rate and rhythm and soft systolic murmur AOV, LSB Extremities: AVF aneurysm Lt forearm Skin: Skin color, texture, turgor normal. No rashes or lesions Neurologic: Grossly normal   ASSESSMENT AND PLAN:   Pre op clearance- Pt needs repair of an AVF aneurysm-OK to hold Eliquis 48 hours pre op.   Ascending aortic aneurysm (HCC) S/P [place]acoub valve sparing root replacement with coronary reconstruction along with reapir of aortic valve with a #23mm HAART 300 aortic ring annuloplasty on 05/07/2017  By Dr Ysidro Evert at [place] hypertension, malignant Controlled- no medications yet this am  Normal coronary arteries 2011 and May 2018  h/o AAA (abdominal aortic aneurysm) S/P stent graft repair secondary to a chronic dissection on 09/25/2012 with evidence of slow continued enlargement of the abdominal aortic native aneurysm sac despite bifurcated  aortic stent graft placement suggesting endoleak. Followed by Dr Trula Slade.  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 No further cardiac work up pre op.    Kerin Ransom PA-C 12/13/2017 9:58 AM

## 2017-12-13 NOTE — Progress Notes (Signed)
Thank you MCr 

## 2017-12-27 ENCOUNTER — Telehealth: Payer: Self-pay | Admitting: Cardiovascular Disease

## 2017-12-27 NOTE — Telephone Encounter (Signed)
Pt of Dr. Sallyanne Kuster  Hx AAA, VTE, HF, aortic insufficiency Not sure if he requires precautions or antibiotic prophylaxis d/t complex medicare history. Routed to MD to review & advise.

## 2017-12-27 NOTE — Telephone Encounter (Signed)
New Message  Pt call requesting to speak with RN. Pt would like to know if it would be okay for him to have a dental cleaning. Pt  States the office did not need him to call and get Clear. Pt state he would like to know from himself if the cleaning will be ok. Please call back to discuss

## 2017-12-28 NOTE — Telephone Encounter (Signed)
No need for antibiotics or other particular precautions for cleaning. Continue all his usual cardiac meds. MCr

## 2017-12-31 ENCOUNTER — Encounter (HOSPITAL_COMMUNITY): Payer: Self-pay | Admitting: *Deleted

## 2017-12-31 NOTE — Progress Notes (Signed)
Denies chest pain or shortness of breath. Stopped Eliquis 12/30/17

## 2018-01-01 NOTE — Telephone Encounter (Signed)
Spoke to patient. Information given . Patient states appointment was postpone until he got the answer.  verbalized understanding.

## 2018-01-01 NOTE — Progress Notes (Signed)
Anesthesia Chart Review:  Pt is a same day work up.   Pt is a 47 year old male scheduled for resection of L radiocephalic AV fistula aneurysm on 01/02/2018 with Annamarie Major, MD  - PCP is Bluford Kaufmann, MD - Cardiologist is Sanda Klein, MD who is aware of surgery. Last office visit 12/13/17 with Kerin Ransom, PA  PMH includes:  AAA (s/p EVAR 2013), HTN, PE, ascending aortic aneurysm (s/p ascending aortic graft, aortic valve valvuloplasty 05/07/17 at [facility]), CKD (stage IV, no dialysis yet). Former smoker.   Medications include: Eliquis, ASA 81 mg, Lipitor, carvedilol, Lasix, olmesartan-amlodipine-HCTZ. Pt to hold eliquis 48 hours prior to surgery.   Labs will be obtained day of surgery.   CXR 05/28/17 (care everywhere): - Median sternotomy wires status post valve repair. - The cardiomediastinal silhouette is stable and enlarged. - No focal consolidation, sizable pleural effusion, or pneumothorax identified. Bilateral low lung volumes with bronchovascular crowding. - No aggressive osseous abnormality.  EKG 06/25/17: NSR. Possible LAE. LVH with repolarization abnormality. Prolonged QT  CT abd/pelvis 10/29/17:  1. Stable appearance of the aortoiliac stent graft. No complicating features are demonstrated. 2. Stable size of the native aortic aneurysm. 3. No acute abdominal/pelvic findings, mass lesions or lymphadenopathy. 4. Cholelithiasis.  R/L  cardiac cath 03/13/17 (by notes in care everywhere): normal coronaries and hemodynamics  Echo 03/13/17 (care everywhere):  1. Normal LV systolic function with moderate LVH. 2. Normal RV systolic function. 3. Mild to moderate AR, trivial MR, trivial PR, trivial TR 4. No valvular stenosis.  If labs acceptable day of surgery, I anticipate pt can proceed with surgery as scheduled.   Willeen Cass, FNP-BC [facility] Stay [facility]/Anesthesiology Phone: [phone] 01/01/2018 12:51 PM

## 2018-01-01 NOTE — Anesthesia Preprocedure Evaluation (Addendum)
Anesthesia Evaluation  Patient identified by MRN, date of birth, ID band Patient awake    Reviewed: Allergy & Precautions, NPO status , Patient's Chart, lab work & pertinent test results, reviewed documented beta blocker date and time   History of Anesthesia Complications Negative for: history of anesthetic complications  Airway Mallampati: III  TM Distance: >3 FB Neck ROM: Full    Dental  (+) Dental Advisory Given, Chipped,    Pulmonary shortness of breath, former smoker, PE   Pulmonary exam normal        Cardiovascular hypertension, Pt. on home beta blockers and Pt. on medications + Peripheral Vascular Disease and + DVT  Normal cardiovascular exam  Study Conclusions  - Left ventricle: The cavity size was normal. Wall thickness was   increased in a pattern of moderate LVH. Systolic function was   normal. The estimated ejection fraction was in the range of 60%   to 65%. Features are consistent with a pseudonormal left   ventricular filling pattern, with concomitant abnormal relaxation   and increased filling pressure (grade 2 diastolic dysfunction). - Aortic valve: Mildly calcified annulus. There was moderate   regurgitation. - Right atrium: The atrium was mildly dilated.   Neuro/Psych Anxiety    GI/Hepatic negative GI ROS, Neg liver ROS,   Endo/Other  Morbid obesity  Renal/GU ESRFRenal disease     Musculoskeletal   Abdominal   Peds  Hematology HLD   Anesthesia Other Findings   Reproductive/Obstetrics                           Anesthesia Physical  Anesthesia Plan  ASA: III  Anesthesia Plan: General   Post-op Pain Management:    Induction: Intravenous  PONV Risk Score and Plan: 2 and Ondansetron and Propofol infusion  Airway Management Planned: Oral ETT  Additional Equipment:   Intra-op Plan:   Post-operative Plan: Extubation in OR  Informed Consent: I have reviewed the  patients History and Physical, chart, labs and discussed the procedure including the risks, benefits and alternatives for the proposed anesthesia with the patient or authorized representative who has indicated his/her understanding and acceptance.   Dental advisory given  Plan Discussed with: CRNA  Anesthesia Plan Comments:       Anesthesia Quick Evaluation

## 2018-01-02 ENCOUNTER — Ambulatory Visit (HOSPITAL_COMMUNITY)
Admission: RE | Admit: 2018-01-02 | Discharge: 2018-01-02 | Disposition: A | Discharge status: Home / Self Care | Payer: Medicare Other | Source: Ambulatory Visit | Attending: Surgery | Admitting: Surgery

## 2018-01-02 ENCOUNTER — Encounter (HOSPITAL_COMMUNITY)
Admission: RE | Disposition: A | Discharge status: Home / Self Care | Payer: Self-pay | Source: Ambulatory Visit | Attending: Surgery

## 2018-01-02 ENCOUNTER — Ambulatory Visit (HOSPITAL_COMMUNITY): Payer: Medicare Other | Admitting: Emergency Medicine

## 2018-01-02 ENCOUNTER — Encounter (HOSPITAL_COMMUNITY): Payer: Self-pay

## 2018-01-02 ENCOUNTER — Other Ambulatory Visit: Payer: Self-pay

## 2018-01-02 DIAGNOSIS — Z79899 Other long term (current) drug therapy: Secondary | ICD-10-CM | POA: Diagnosis not present

## 2018-01-02 DIAGNOSIS — F419 Anxiety disorder, unspecified: Secondary | ICD-10-CM | POA: Diagnosis not present

## 2018-01-02 DIAGNOSIS — Z8249 Family history of ischemic heart disease and other diseases of the circulatory system: Secondary | ICD-10-CM | POA: Diagnosis not present

## 2018-01-02 DIAGNOSIS — Z7901 Long term (current) use of anticoagulants: Secondary | ICD-10-CM | POA: Insufficient documentation

## 2018-01-02 DIAGNOSIS — Z86718 Personal history of other venous thrombosis and embolism: Secondary | ICD-10-CM | POA: Diagnosis not present

## 2018-01-02 DIAGNOSIS — T82898A Other specified complication of vascular prosthetic devices, implants and grafts, initial encounter: Secondary | ICD-10-CM | POA: Insufficient documentation

## 2018-01-02 DIAGNOSIS — I770 Arteriovenous fistula, acquired: Secondary | ICD-10-CM | POA: Diagnosis not present

## 2018-01-02 DIAGNOSIS — R0602 Shortness of breath: Secondary | ICD-10-CM | POA: Insufficient documentation

## 2018-01-02 DIAGNOSIS — I739 Peripheral vascular disease, unspecified: Secondary | ICD-10-CM | POA: Insufficient documentation

## 2018-01-02 DIAGNOSIS — I868 Varicose veins of other specified sites: Secondary | ICD-10-CM | POA: Insufficient documentation

## 2018-01-02 DIAGNOSIS — Z6841 Body Mass Index (BMI) 40.0 and over, adult: Secondary | ICD-10-CM | POA: Insufficient documentation

## 2018-01-02 DIAGNOSIS — I351 Nonrheumatic aortic (valve) insufficiency: Secondary | ICD-10-CM | POA: Insufficient documentation

## 2018-01-02 DIAGNOSIS — I714 Abdominal aortic aneurysm, without rupture: Secondary | ICD-10-CM | POA: Diagnosis not present

## 2018-01-02 DIAGNOSIS — X58XXXA Exposure to other specified factors, initial encounter: Secondary | ICD-10-CM | POA: Insufficient documentation

## 2018-01-02 DIAGNOSIS — Z86711 Personal history of pulmonary embolism: Secondary | ICD-10-CM | POA: Diagnosis not present

## 2018-01-02 DIAGNOSIS — Z8673 Personal history of transient ischemic attack (TIA), and cerebral infarction without residual deficits: Secondary | ICD-10-CM | POA: Diagnosis not present

## 2018-01-02 DIAGNOSIS — N184 Chronic kidney disease, stage 4 (severe): Secondary | ICD-10-CM | POA: Insufficient documentation

## 2018-01-02 DIAGNOSIS — I120 Hypertensive chronic kidney disease with stage 5 chronic kidney disease or end stage renal disease: Secondary | ICD-10-CM | POA: Diagnosis not present

## 2018-01-02 DIAGNOSIS — N186 End stage renal disease: Secondary | ICD-10-CM | POA: Diagnosis not present

## 2018-01-02 DIAGNOSIS — Z87891 Personal history of nicotine dependence: Secondary | ICD-10-CM | POA: Insufficient documentation

## 2018-01-02 DIAGNOSIS — I129 Hypertensive chronic kidney disease with stage 1 through stage 4 chronic kidney disease, or unspecified chronic kidney disease: Secondary | ICD-10-CM | POA: Insufficient documentation

## 2018-01-02 HISTORY — PX: RESECTION OF ARTERIOVENOUS FISTULA ANEURYSM: SHX6070

## 2018-01-02 LAB — URINALYSIS, ROUTINE W REFLEX MICROSCOPIC
BACTERIA UA: NONE SEEN
BILIRUBIN URINE: NEGATIVE
Glucose, UA: NEGATIVE mg/dL
HGB URINE DIPSTICK: NEGATIVE
Ketones, ur: NEGATIVE mg/dL
LEUKOCYTES UA: NEGATIVE
NITRITE: NEGATIVE
PH: 6 (ref 5.0–8.0)
Protein, ur: 30 mg/dL — AB
SPECIFIC GRAVITY, URINE: 1.013 (ref 1.005–1.030)

## 2018-01-02 LAB — PROTIME-INR
INR: 1.05
Prothrombin Time: 13.6 seconds (ref 11.4–15.2)

## 2018-01-02 SURGERY — RESECTION OF ARTERIOVENOUS FISTULA ANEURYSM
Anesthesia: General | Site: Arm Lower | Laterality: Left

## 2018-01-02 MED ORDER — SODIUM CHLORIDE 0.9 % IV SOLN: INTRAVENOUS | Status: DC

## 2018-01-02 MED ORDER — FENTANYL CITRATE (PF) 250 MCG/5ML IJ SOLN
INTRAMUSCULAR | Status: AC
  Filled 2018-01-02: qty 5

## 2018-01-02 MED ORDER — FENTANYL CITRATE (PF) 100 MCG/2ML IJ SOLN
INTRAMUSCULAR | Status: DC | PRN
  Administered 2018-01-02: 100 ug via INTRAVENOUS
  Administered 2018-01-02: 50 ug via INTRAVENOUS
  Administered 2018-01-02: 100 ug via INTRAVENOUS

## 2018-01-02 MED ORDER — ONDANSETRON HCL 4 MG/2ML IJ SOLN
INTRAMUSCULAR | Status: AC
  Filled 2018-01-02: qty 2

## 2018-01-02 MED ORDER — LIDOCAINE-EPINEPHRINE (PF) 1 %-1:200000 IJ SOLN
INTRAMUSCULAR | Status: AC
  Filled 2018-01-02: qty 30

## 2018-01-02 MED ORDER — PROMETHAZINE HCL 25 MG/ML IJ SOLN: 6.2500 mg | INTRAMUSCULAR | Status: DC | PRN

## 2018-01-02 MED ORDER — SUCCINYLCHOLINE CHLORIDE 20 MG/ML IJ SOLN
INTRAMUSCULAR | Status: DC | PRN
  Administered 2018-01-02: 160 mg via INTRAVENOUS

## 2018-01-02 MED ORDER — MIDAZOLAM HCL 2 MG/2ML IJ SOLN
INTRAMUSCULAR | Status: AC
  Filled 2018-01-02: qty 2

## 2018-01-02 MED ORDER — PROPOFOL 10 MG/ML IV BOLUS
INTRAVENOUS | Status: DC | PRN
  Administered 2018-01-02: 200 mg via INTRAVENOUS

## 2018-01-02 MED ORDER — PHENYLEPHRINE HCL 10 MG/ML IJ SOLN
INTRAMUSCULAR | Status: DC | PRN
  Administered 2018-01-02: 80 ug via INTRAVENOUS

## 2018-01-02 MED ORDER — LIDOCAINE HCL (CARDIAC) 20 MG/ML IV SOLN
INTRAVENOUS | Status: DC | PRN
  Administered 2018-01-02: 100 mg via INTRAVENOUS

## 2018-01-02 MED ORDER — ONDANSETRON HCL 4 MG/2ML IJ SOLN
INTRAMUSCULAR | Status: DC | PRN
  Administered 2018-01-02: 4 mg via INTRAVENOUS

## 2018-01-02 MED ORDER — DEXAMETHASONE SODIUM PHOSPHATE 10 MG/ML IJ SOLN
INTRAMUSCULAR | Status: AC
  Filled 2018-01-02: qty 1

## 2018-01-02 MED ORDER — HYDROMORPHONE HCL 1 MG/ML IJ SOLN: 0.2500 mg | INTRAMUSCULAR | Status: DC | PRN

## 2018-01-02 MED ORDER — TRAMADOL HCL 50 MG PO TABS: 50.0000 mg | ORAL_TABLET | Freq: Four times a day (QID) | ORAL | 0 refills | Status: DC | PRN

## 2018-01-02 MED ORDER — TRAMADOL HCL 50 MG PO TABS
50.0000 mg | ORAL_TABLET | Freq: Four times a day (QID) | ORAL | 0 refills | Status: AC | PRN
Start: 2018-01-02 — End: 2019-01-02

## 2018-01-02 MED ORDER — PROPOFOL 10 MG/ML IV BOLUS
INTRAVENOUS | Status: AC
Start: 2018-01-02 — End: ?
  Filled 2018-01-02: qty 20

## 2018-01-02 MED ORDER — DEXTROSE 5 % IV SOLN
1.5000 g | INTRAVENOUS | Status: AC
  Administered 2018-01-02: 1.5 g via INTRAVENOUS
  Filled 2018-01-02: qty 1.5

## 2018-01-02 MED ORDER — PROPOFOL 10 MG/ML IV BOLUS
INTRAVENOUS | Status: AC
Start: 2018-01-02 — End: ?
  Filled 2018-01-02: qty 40

## 2018-01-02 MED ORDER — SODIUM CHLORIDE 0.9 % IV SOLN
INTRAVENOUS | Status: DC | PRN
  Administered 2018-01-02: 500 mL

## 2018-01-02 MED ORDER — MIDAZOLAM HCL 5 MG/5ML IJ SOLN
INTRAMUSCULAR | Status: DC | PRN
  Administered 2018-01-02: 2 mg via INTRAVENOUS

## 2018-01-02 MED ORDER — EPHEDRINE SULFATE 50 MG/ML IJ SOLN
INTRAMUSCULAR | Status: DC | PRN
  Administered 2018-01-02 (×3): 10 mg via INTRAVENOUS

## 2018-01-02 MED ORDER — DEXAMETHASONE SODIUM PHOSPHATE 4 MG/ML IJ SOLN
INTRAMUSCULAR | Status: DC | PRN
  Administered 2018-01-02: 10 mg via INTRAVENOUS

## 2018-01-02 MED ORDER — LIDOCAINE-EPINEPHRINE 0.5 %-1:200000 IJ SOLN
INTRAMUSCULAR | Status: AC
  Filled 2018-01-02: qty 1

## 2018-01-02 MED ORDER — 0.9 % SODIUM CHLORIDE (POUR BTL) OPTIME
TOPICAL | Status: DC | PRN
  Administered 2018-01-02: 1000 mL

## 2018-01-02 MED ORDER — SODIUM CHLORIDE 0.9 % IV SOLN
INTRAVENOUS | Status: DC | PRN
  Administered 2018-01-02 (×2): via INTRAVENOUS

## 2018-01-02 SURGICAL SUPPLY — 34 items
ADH SKN CLS APL DERMABOND .7 (GAUZE/BANDAGES/DRESSINGS) ×1
ARMBAND PINK RESTRICT EXTREMIT (MISCELLANEOUS) ×2 IMPLANT
CANISTER SUCT 3000ML PPV (MISCELLANEOUS) ×2 IMPLANT
CLIP VESOCCLUDE MED 6/CT (CLIP) ×2 IMPLANT
CLIP VESOCCLUDE SM WIDE 6/CT (CLIP) ×2 IMPLANT
COVER PROBE W GEL 5X96 (DRAPES) ×2 IMPLANT
DERMABOND ADVANCED (GAUZE/BANDAGES/DRESSINGS) ×1
DERMABOND ADVANCED .7 DNX12 (GAUZE/BANDAGES/DRESSINGS) ×1 IMPLANT
ELECT REM PT RETURN 9FT ADLT (ELECTROSURGICAL) ×2
ELECTRODE REM PT RTRN 9FT ADLT (ELECTROSURGICAL) ×1 IMPLANT
GLOVE BIO SURGEON STRL SZ 6.5 (GLOVE) ×1 IMPLANT
GLOVE BIOGEL PI IND STRL 6.5 (GLOVE) IMPLANT
GLOVE BIOGEL PI IND STRL 7.5 (GLOVE) ×1 IMPLANT
GLOVE BIOGEL PI INDICATOR 6.5 (GLOVE) ×1
GLOVE BIOGEL PI INDICATOR 7.5 (GLOVE) ×1
GLOVE SURG SS PI 7.5 STRL IVOR (GLOVE) ×2 IMPLANT
GOWN STRL REUS W/ TWL LRG LVL3 (GOWN DISPOSABLE) ×2 IMPLANT
GOWN STRL REUS W/ TWL XL LVL3 (GOWN DISPOSABLE) ×1 IMPLANT
GOWN STRL REUS W/TWL LRG LVL3 (GOWN DISPOSABLE) ×4
GOWN STRL REUS W/TWL XL LVL3 (GOWN DISPOSABLE) ×4
HEMOSTAT SNOW SURGICEL 2X4 (HEMOSTASIS) IMPLANT
KIT BASIN OR (CUSTOM PROCEDURE TRAY) ×2 IMPLANT
KIT ROOM TURNOVER OR (KITS) ×2 IMPLANT
NS IRRIG 1000ML POUR BTL (IV SOLUTION) ×2 IMPLANT
PACK CV ACCESS (CUSTOM PROCEDURE TRAY) ×2 IMPLANT
PAD ARMBOARD 7.5X6 YLW CONV (MISCELLANEOUS) ×4 IMPLANT
SUT PROLENE 5 0 C 1 24 (SUTURE) ×1 IMPLANT
SUT PROLENE 6 0 CC (SUTURE) ×2 IMPLANT
SUT VIC AB 3-0 SH 27 (SUTURE) ×2
SUT VIC AB 3-0 SH 27X BRD (SUTURE) ×1 IMPLANT
SUT VICRYL 4-0 PS2 18IN ABS (SUTURE) ×2 IMPLANT
TOWEL GREEN STERILE (TOWEL DISPOSABLE) ×2 IMPLANT
UNDERPAD 30X30 (UNDERPADS AND DIAPERS) ×2 IMPLANT
WATER STERILE IRR 1000ML POUR (IV SOLUTION) ×2 IMPLANT

## 2018-01-02 NOTE — Transfer of Care (Signed)
Immediate Anesthesia Transfer of Care Note  Patient: [name]  Procedure(s) Performed: RESECTION OF ARTERIOVENOUS FISTULA ANEURYSM LEFT RADIOCEPHALIC (Left Arm Lower)  Patient Location: PACU  Anesthesia Type:General  Level of Consciousness: awake and alert   Airway & Oxygen Therapy: Patient Spontanous Breathing and Patient connected to face mask oxygen  Post-op Assessment: Report given to RN and Post -op Vital signs reviewed and stable  Post vital signs: Reviewed and stable  Last Vitals:  Vitals:   01/02/18 0708 01/02/18 1818  BP: 136/87 128/80  Pulse: 81 75  Resp: 20 20  Temp: 37.5 C (!) 36.4 C  SpO2: 100% 97%    Last Pain:  Vitals:   01/02/18 0708  TempSrc: Oral         Complications: No apparent anesthesia complications

## 2018-01-02 NOTE — Progress Notes (Signed)
Patient called out stating his right wrist was itching.  Not sure if it is from the CHG wipes or the arm band but no other place on body is itching.  2 raised red areas on right wrist.  Wiped wrist with wet washcloth and patient felt relief.  Will continue to monitor patient.

## 2018-01-02 NOTE — Progress Notes (Signed)
Patient arrived to PACU without chart.  Unable to find on unit and per OR staff it was not left in the room.  Paged [place], [place] without response.  Sam, PA came to bedside to re-write prescription so that patient could be discharged. Will file chart once it is found.

## 2018-01-02 NOTE — Anesthesia Procedure Notes (Signed)
Procedure Name: Intubation Date/Time: 01/02/2018 5:06 PM Performed by: Savalas Monje T, CRNA Pre-anesthesia Checklist: Patient identified, Emergency Drugs available, Suction available and Patient being monitored Patient Re-evaluated:Patient Re-evaluated prior to induction Oxygen Delivery Method: Circle system utilized Preoxygenation: Pre-oxygenation with 100% oxygen Induction Type: IV induction Ventilation: Mask ventilation without difficulty and Oral airway inserted - appropriate to patient size Laryngoscope Size: Miller and 3 Grade View: Grade I Tube type: Oral Tube size: 7.5 mm Number of attempts: 1 Airway Equipment and Method: Patient positioned with wedge pillow and Stylet Placement Confirmation: ETT inserted through vocal cords under direct vision,  positive ETCO2 and breath sounds checked- equal and bilateral Secured at: 23 cm Tube secured with: Tape Dental Injury: Teeth and Oropharynx as per pre-operative assessment

## 2018-01-02 NOTE — H&P (Signed)
   Patient name: [name] MRN: [id number] DOB: 1971/07/04 Sex: male  REASON FOR VISIT:     pre-op  HISTORY OF PRESENT ILLNESS:   [name] is a 47 y.o. male with large aneurysmal left RCF aneurysms  CURRENT MEDICATIONS:    Current Facility-Administered Medications  Medication Dose Route Frequency Provider Last Rate Last Dose  . 0.9 %  sodium chloride infusion   Intravenous Continuous [name], MD      . cefUROXime (ZINACEF) 1.5 g in dextrose 5 % 50 mL IVPB  1.5 g Intravenous 30 min Pre-Op [name], MD        REVIEW OF SYSTEMS:   [X]  denotes positive finding, [ ]  denotes negative finding Cardiac  Comments:  Chest pain or chest pressure:    Shortness of breath upon exertion:    Short of breath when lying flat:    Irregular heart rhythm:    Constitutional    Fever or chills:      PHYSICAL EXAM:   There were no vitals filed for this visit.  GENERAL: The patient is a well-nourished male, in no acute distress. The vital signs are documented above. CARDIOVASCULAR: There is a regular rate and rhythm. PULMONARY: Non-labored respirations AVF aneurysm x 2  STUDIES:   none   MEDICAL ISSUES:   Plan for aneurysmorophy x2.  All questions answered  Annamarie Major, MD [facility] [phone] Pager [phone]

## 2018-01-03 ENCOUNTER — Telehealth: Payer: Self-pay | Admitting: Vascular Surgery

## 2018-01-03 ENCOUNTER — Encounter (HOSPITAL_COMMUNITY): Payer: Self-pay | Admitting: Vascular Surgery

## 2018-01-03 DIAGNOSIS — T82898A Other specified complication of vascular prosthetic devices, implants and grafts, initial encounter: Secondary | ICD-10-CM | POA: Diagnosis not present

## 2018-01-03 LAB — POCT I-STAT, CHEM 8
BUN: 30 mg/dL — AB (ref 6–20)
CALCIUM ION: 1.16 mmol/L (ref 1.15–1.40)
CREATININE: 3.2 mg/dL — AB (ref 0.61–1.24)
Chloride: 109 mmol/L (ref 101–111)
GLUCOSE: 118 mg/dL — AB (ref 65–99)
HCT: 40 % (ref 39.0–52.0)
Hemoglobin: 13.6 g/dL (ref 13.0–17.0)
Potassium: 4.2 mmol/L (ref 3.5–5.1)
Sodium: 142 mmol/L (ref 135–145)
TCO2: 23 mmol/L (ref 22–32)

## 2018-01-03 NOTE — Op Note (Signed)
    OPERATIVE REPORT  DATE OF SURGERY: 01/03/2018  PATIENT: [name], 47 y.o. 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  PHYSICIAN ASSISTANT: Matt Eveland PA-C  ANESTHESIA: Enteral  EBL: Normal ml  Total I/O In: 760 [P.O.:240; I.V.:520] Out: 50 [Blood:50]  BLOOD ADMINISTERED: None  DRAINS: None  SPECIMEN: None  COUNTS CORRECT:  YES  PLAN OF CARE: PACU  PATIENT DISPOSITION:  PACU - hemodynamically stable  PROCEDURE DETAILS: Patient had been seen by Dr. Trula Slade as an outpatient and was scheduled for revision of his fistula.  He has a large radiocephalic fistula was placed years ago.  He has never been on hemodialysis and is never had use of the fistula.  He is concerned regarding the large size particularly of the aneurysmal segment just above his wrist.  On physical exam he has extensive tortuosity of the entire fistula but does have a large aneurysmal change at the level of the wrist.  I have recommended exposure of this probable excision of the area at the wrist.  Explained that plication of this.  Would recommend against any treatment of the upper forearm portion.  This would still be available for him and that should he develop end-stage renal disease.  After [name] is involved with emergency surgery and I explained that I could perform the operation and the patient agrees to proceed   The left arm was prepped and draped in usual sterile fashion.  An incision was made over the wrist and the very large aneurysm at the wrist was mobilized circumferentially.  The cephalic vein at the radial artery was approximately 1-1-1/2 cm and then became extremely and the vein was tortuous above this venous aneurysm and was mobilized circumferentially as well.  There  was adequate length to anastomose the vein into into itself with resection of the venous aneurysm due to the tortuosity.  The cephalic vein wa occluded just distal to the arteriovenous anastomosis at the wrist and was also alluded above the aneurysm.  The vein was transected above and below the aneurysm.  The vein was mobilized to allow re-anastomosis without tension.  The vein was sewn end-to-end to itself with a running 5-0 Prolene suture.  Clamps were removed and good flow was noted through the fistula.  Wounds were irrigated with saline.  Hemostasis obtained electrocautery.  The wounds were closed with 3-0 Vicryl in the subcutaneous and subcuticular tissue.  Sterile dressing was applied and the patient was transferred to the recovery room in stable condition   [name], M.D., [facility] 01/03/2018 9:38 AM

## 2018-01-03 NOTE — Anesthesia Postprocedure Evaluation (Addendum)
Anesthesia Post Note  Patient: [name]  Procedure(s) Performed: RESECTION OF ARTERIOVENOUS FISTULA ANEURYSM LEFT RADIOCEPHALIC (Left Arm Lower)     Patient location during evaluation: PACU Anesthesia Type: General Level of consciousness: awake and alert Pain management: pain level controlled Vital Signs Assessment: post-procedure vital signs reviewed and stable Respiratory status: spontaneous breathing, nonlabored ventilation, respiratory function stable and patient connected to nasal cannula oxygen Cardiovascular status: blood pressure returned to baseline and stable Postop Assessment: no apparent nausea or vomiting Anesthetic complications: no    Last Vitals:  Vitals:   01/02/18 1918 01/02/18 1930  BP: (!) 130/96 (!) 142/93  Pulse: 74 76  Resp: 17 12  Temp: 36.6 C   SpO2: 94% 99%    Last Pain:  Vitals:   01/02/18 1848  TempSrc:   PainSc: 2                  Effie Berkshire

## 2018-01-03 NOTE — Telephone Encounter (Signed)
Sched appt 02/05/18 at 12:15. Spoke to pt.

## 2018-01-03 NOTE — Telephone Encounter (Signed)
-----   Message from Mena Goes, RN sent at 01/03/2018  9:59 AM EST ----- Regarding: 2-3 weeks with Dr. Donnetta Hutching post resection PSA   ----- Message ----- From: Iline Oven Sent: 01/02/2018   5:51 PM To: [facility] Charge Pool  Can you schedule an appt for this pt in about 2-3 weeks for wound check with Dr. Donnetta Hutching.  PO L radiocephalic aneurysm resection. Thanks, [facility]

## 2018-01-21 ENCOUNTER — Telehealth: Payer: Self-pay | Admitting: Cardiovascular Disease

## 2018-01-21 ENCOUNTER — Telehealth: Payer: Self-pay | Admitting: Internal Medicine

## 2018-01-21 NOTE — Telephone Encounter (Signed)
Copied from [place]. Topic: General - Other >> Jan 21, 2018 11:40 AM [name] wrote: Reason for CRM: Patient requesting to know if the ofc has any samples of apixaban (ELIQUIS) 2.5 MG TABS tablet

## 2018-01-21 NOTE — Telephone Encounter (Signed)
Patient aware samples are at the front desk for pick up  

## 2018-01-21 NOTE — Telephone Encounter (Signed)
Copied from [place]. Topic: General - Other >> Jan 21, 2018 11:40 AM Synthia Innocent wrote: Reason for CRM: Patient requesting to know if the ofc has any samples of apixaban (ELIQUIS) 2.5 MG TABS tablet

## 2018-01-21 NOTE — Telephone Encounter (Signed)
Copied from [place]. Topic: General - Other >> Jan 21, 2018 11:40 AM Synthia Innocent wrote: Reason for CRM: Patient requesting to know if the ofc has any samples of apixaban (ELIQUIS) 2.5 MG TABS tablet

## 2018-01-21 NOTE — Telephone Encounter (Signed)
Patient calling the office for samples of medication:   1.  What medication and dosage are you requesting samples for? [name] 2.5 mg  2.  Are you currently out of this medication? "not quite"

## 2018-01-22 NOTE — Telephone Encounter (Signed)
Pt was called and informed that unfortunately the office doesn't have any samples of apixaban (ELIQUIS) 2.5 MG at this time. Pt verbalized understanding.

## 2018-02-05 ENCOUNTER — Ambulatory Visit (INDEPENDENT_AMBULATORY_CARE_PROVIDER_SITE_OTHER): Payer: Self-pay | Admitting: Vascular Surgery

## 2018-02-05 ENCOUNTER — Encounter: Payer: Self-pay | Admitting: Vascular Surgery

## 2018-02-05 VITALS — BP 141/86 | HR 88 | Temp 97.2°F | Resp 16 | Ht 72.0 in | Wt 368.0 lb

## 2018-02-05 DIAGNOSIS — N184 Chronic kidney disease, stage 4 (severe): Secondary | ICD-10-CM

## 2018-02-05 NOTE — Progress Notes (Signed)
   Patient name: [name] MRN: [id number] DOB: [birth year] Sex: male  REASON FOR VISIT: Follow-up of recent aneurysm resection radiocephalic AV fistula  HPI: [name] is a 47 y.o. male here today for follow-up.  He underwent resection of a venous injury arteriovenous anastomosis of AV fistula on 01/03/2018.  This fistula been present for many years was recommended that he undergo repair of this large fistula near his wrist.  He did have tortuosity of the vein and I was able to mobilize this and resect the fistula with an end-to-end anastomosis.  He has had no trouble since surgery  Current Outpatient Medications  Medication Sig Dispense Refill  . acetaminophen (TYLENOL) 500 MG tablet Take 1,000 mg by mouth every 6 (six) hours as needed for moderate pain or headache. Reported on 12/14/2015    . allopurinol (ZYLOPRIM) 300 MG tablet TAKE 1 TABLET BY MOUTH  DAILY (Patient taking differently: TAKE 0.5 TABLET (150 MG)  BY MOUTH  DAILY IN THE EVENING.) 90 tablet 0  . apixaban (ELIQUIS) 2.5 MG TABS tablet Take 1 tablet (2.5 mg total) by mouth 2 (two) times daily. 28 tablet 0  . aspirin 81 MG chewable tablet Chew 81 mg by mouth daily.    [name] atorvastatin (LIPITOR) 10 MG tablet TAKE 1 TABLET BY MOUTH  DAILY (Patient taking differently: TAKE 1 TABLET BY MOUTH  DAILY IN THE EVENING.) 90 tablet 0  . calcitRIOL (ROCALTROL) 0.25 MCG capsule TAKE 1 CAPSULE BY MOUTH  EVERY MONDAY, WEDNESDAY,  AND FRIDAY. (Patient taking differently: Take 0.25 mcg by mouth every Monday, Wednesday, and Friday. In the morning.) 24 capsule 4  . carvedilol (COREG) 25 MG tablet TAKE 1 TABLET BY MOUTH TWO  TIMES DAILY 180 tablet 1  . cetirizine (ZYRTEC) 10 MG tablet Take 10 mg by mouth daily as needed (for allergies.).     [name] furosemide (LASIX) 40 MG tablet TAKE 1 TABLET BY MOUTH  DAILY (Patient taking differently: TAKE 1 TABLET BY MOUTH  DAILY IN THE MORNING.) 90 tablet 0  .  Olmesartan-Amlodipine-HCTZ 40-10-12.5 MG TABS TAKE 1 TABLET BY MOUTH  DAILY 90 tablet 1  . OVER THE COUNTER MEDICATION Place 1 drop into both eyes daily. OVER THE COUNTER NUMBING EYE DROPS    . sennosides-docusate sodium (SENOKOT-S) 8.6-50 MG tablet Take 1 tablet by mouth daily.     . traMADol (ULTRAM) 50 MG tablet Take 1 tablet (50 mg total) by mouth every 6 (six) hours as needed. 12 tablet 0   No current facility-administered medications for this visit.      PHYSICAL EXAM: Vitals:   02/05/18 1208  BP: (!) 141/86  Pulse: 88  Resp: 16  Temp: (!) 97.2 F (36.2 C)  SpO2: 97%  Weight: (!) 368 lb (166.9 kg)  Height: 6' (1.829 m)    GENERAL: The patient is a well-nourished male, in no acute distress. The vital signs are documented above. The incision is well-healed and he continues to have flow in the very large fistula throughout his forearm.  MEDICAL ISSUES: Stable overall.  Will see [place] again on an as-needed basis.  Has never needed his fistula and has chronic stable renal function.  Will see [place] again on an as-needed basis   [name], MD [facility] Tel [phone] Pager [phone]

## 2018-02-08 ENCOUNTER — Other Ambulatory Visit: Payer: Self-pay | Admitting: Cardiovascular Disease

## 2018-02-08 ENCOUNTER — Other Ambulatory Visit: Payer: Self-pay | Admitting: Internal Medicine

## 2018-02-08 NOTE — Telephone Encounter (Signed)
REFILL 

## 2018-02-12 ENCOUNTER — Telehealth: Payer: Self-pay | Admitting: Cardiovascular Disease

## 2018-02-12 ENCOUNTER — Telehealth: Payer: Self-pay | Admitting: Internal Medicine

## 2018-02-12 NOTE — Telephone Encounter (Signed)
Patient calling the office for samples of medication:   1.  What medication and dosage are you requesting samples for? Eliquis, 2.5 mg   2.  Are you currently out of this medication? No, two days supply

## 2018-02-12 NOTE — Telephone Encounter (Signed)
Patient aware and verbalized understanding. °

## 2018-02-12 NOTE — Telephone Encounter (Signed)
Medication filled to pharmacy as requested.   

## 2018-02-12 NOTE — Telephone Encounter (Signed)
Copied from [place] [id number]. Topic: Quick Communication - Rx Refill/Question >> Feb 12, 2018  2:12 PM Scherrie Gerlach wrote: Medication: apixaban (ELIQUIS) 2.5 MG TABS tablet Pt is calling to request samples of this med

## 2018-02-12 NOTE — Telephone Encounter (Signed)
No samples available at [facility].   Patient is almost out.  Will check with our [employer].

## 2018-02-12 NOTE — Telephone Encounter (Signed)
No samples available at [facility].

## 2018-02-12 NOTE — Telephone Encounter (Signed)
Please advise refills. Patient has not seen you since 2017.

## 2018-02-12 NOTE — Telephone Encounter (Signed)
Okay for refill?  

## 2018-02-13 NOTE — Telephone Encounter (Signed)
We do have samples available. This med is prescribed by his cardiologist, but their office is out of samples. Okay to dispense 28 day sample supply of Eliquis 2.5mg  even though you are not prescriber? Thanks!

## 2018-02-14 NOTE — Telephone Encounter (Signed)
Patient notified Eliquis 2.5 mg tabs samples available for pick up.

## 2018-03-04 ENCOUNTER — Telehealth: Payer: Self-pay | Admitting: Internal Medicine

## 2018-03-04 ENCOUNTER — Telehealth: Payer: Self-pay | Admitting: Cardiovascular Disease

## 2018-03-04 NOTE — Telephone Encounter (Signed)
Copied from [place]. Topic: Inquiry >> Mar 04, 2018 11:04 AM Margot Ables wrote: Reason for CRM: pt states he is needing samples of Eliquis. He has 1 dose left for today. He states he has been getting samples. Please advise.

## 2018-03-04 NOTE — Telephone Encounter (Signed)
Message routed to provider's CMA.

## 2018-03-04 NOTE — Telephone Encounter (Signed)
Pt has also called PCP for samples. We do not keep a large quantity of this medication in sample stock. Pt was advised to contact cardiologist as we do not prescribe this medication for this pt. Pt's wife upset that we cannot continue to give samples of a medication we do not prescribe. Per phone note pt cannot afford copay for this medication. Could someone in your office please advise pt on any possible Patient Assistance programs for this medication? Thanks!

## 2018-03-04 NOTE — Telephone Encounter (Signed)
Caller Name: Venkat Ankney, wife Phone: [phone]  Pt wife called in very upset with the response they received from the nurse who called. Ms. Lansdowne states she normally talks to [place] or [place]. She said they were told by Dr. Raliegh Ip that any time the office has samples that he can come and get them. They are very upset at the response that was given and felt it was very unprofessional that the pharmacy was called on the pts behalf. They cannot afford the $200 copay (it is $900 without insurance). She states they have never had a problem getting the medication. They have been told to call every week to see if samples are available. She is requesting call from Adele Dan, or Dr. Raliegh Ip.  Pt has 1 dose for today and no meds tomorrow.

## 2018-03-04 NOTE — Telephone Encounter (Signed)
Pt has been getting samples from PCP for the past 4 months. Called [facility] to ask if pt has picked up rx recently. They state that his current rx is expired.  Called pt and advised him to contact cardiology for a refill and possible samples. Pt states he has already contacted them. Nothing further needed.

## 2018-03-04 NOTE — Telephone Encounter (Signed)
Cardiologist has provided pt with samples. They have also been asked to provide pt with Patient Assistance info to try and get help with paying copay.

## 2018-03-04 NOTE — Telephone Encounter (Signed)
Patient has been made aware that samples are available at the front.  Medication Samples have been provided to the patient.  Drug name: Eliquis       Strength: 2.5 mg        Qty: 2 boxes  LOT: [id number]  Exp.Date: 12/19

## 2018-03-04 NOTE — Telephone Encounter (Signed)
New Message   Patient calling the office for samples of medication:   1.  What medication and dosage are you requesting samples for? apixaban (ELIQUIS) 2.5 MG TABS tablet  2.  Are you currently out of this medication? After today he will be out

## 2018-03-19 ENCOUNTER — Other Ambulatory Visit: Payer: Self-pay

## 2018-03-19 MED ORDER — OLMESARTAN-AMLODIPINE-HCTZ 40-10-12.5 MG PO TABS: 1.0000 | ORAL_TABLET | Freq: Every day | ORAL | 1 refills | Status: DC

## 2018-03-19 MED ORDER — CARVEDILOL 25 MG PO TABS: 25.0000 mg | ORAL_TABLET | Freq: Two times a day (BID) | ORAL | 1 refills | Status: DC

## 2018-03-19 MED ORDER — FUROSEMIDE 40 MG PO TABS: 40.0000 mg | ORAL_TABLET | Freq: Every day | ORAL | 1 refills | Status: DC

## 2018-04-01 ENCOUNTER — Ambulatory Visit: Payer: Medicare Other | Admitting: Cardiovascular Disease

## 2018-05-03 DIAGNOSIS — Z9889 Other specified postprocedural states: Secondary | ICD-10-CM | POA: Diagnosis not present

## 2018-05-03 DIAGNOSIS — I712 Thoracic aortic aneurysm, without rupture: Secondary | ICD-10-CM | POA: Diagnosis not present

## 2018-05-03 DIAGNOSIS — Z8679 Personal history of other diseases of the circulatory system: Secondary | ICD-10-CM | POA: Diagnosis not present

## 2018-05-06 ENCOUNTER — Other Ambulatory Visit: Payer: Self-pay | Admitting: Internal Medicine

## 2018-05-10 ENCOUNTER — Other Ambulatory Visit: Payer: Self-pay | Admitting: Internal Medicine

## 2018-05-21 ENCOUNTER — Telehealth: Payer: Self-pay | Admitting: Internal Medicine

## 2018-05-21 NOTE — Telephone Encounter (Signed)
Copied from [place] [phone]. Topic: Quick Communication - See Telephone Encounter >> May 21, 2018 12:33 PM Conception Chancy, NT wrote: CRM for notification. See Telephone encounter for: 05/21/18.  Patient would like to know if there is any samples in the office for apixaban (ELIQUIS) 2.5 MG TABS tablet. He would like Dr. Raliegh Ip nurse to return call.

## 2018-05-23 ENCOUNTER — Other Ambulatory Visit: Payer: Self-pay | Admitting: Internal Medicine

## 2018-05-23 ENCOUNTER — Telehealth: Payer: Self-pay | Admitting: Family Medicine

## 2018-05-23 NOTE — Telephone Encounter (Signed)
Pt would like to establish care with Dr. Sarajane Jews, say's he has discussed this with Dr. Sarajane Jews in the past, if ever Dr. Shan Levans he would like for Dr. Sarajane Jews to take over as PCP.

## 2018-05-23 NOTE — Telephone Encounter (Signed)
Samples given to pt 05/23/2018 while in lab with wife.

## 2018-05-24 NOTE — Telephone Encounter (Signed)
Yes I can see him thanks  

## 2018-05-24 NOTE — Telephone Encounter (Signed)
I left a voice message with below information, pt said it was okay to leave a message when he made this request.

## 2018-06-17 DIAGNOSIS — N189 Chronic kidney disease, unspecified: Secondary | ICD-10-CM | POA: Diagnosis not present

## 2018-06-17 DIAGNOSIS — N183 Chronic kidney disease, stage 3 (moderate): Secondary | ICD-10-CM | POA: Diagnosis not present

## 2018-06-17 DIAGNOSIS — Z9889 Other specified postprocedural states: Secondary | ICD-10-CM | POA: Diagnosis not present

## 2018-06-17 DIAGNOSIS — N2581 Secondary hyperparathyroidism of renal origin: Secondary | ICD-10-CM | POA: Diagnosis not present

## 2018-06-17 DIAGNOSIS — D631 Anemia in chronic kidney disease: Secondary | ICD-10-CM | POA: Diagnosis not present

## 2018-06-17 DIAGNOSIS — E785 Hyperlipidemia, unspecified: Secondary | ICD-10-CM | POA: Diagnosis not present

## 2018-06-25 ENCOUNTER — Telehealth: Payer: Self-pay | Admitting: *Deleted

## 2018-06-25 ENCOUNTER — Other Ambulatory Visit: Payer: Self-pay | Admitting: Cardiovascular Disease

## 2018-06-25 ENCOUNTER — Telehealth: Payer: Self-pay | Admitting: Internal Medicine

## 2018-06-25 MED ORDER — APIXABAN 5 MG PO TABS: ORAL_TABLET | ORAL | 0 refills | Status: DC

## 2018-06-25 MED ORDER — APIXABAN 5 MG PO TABS: 2.5000 mg | ORAL_TABLET | Freq: Two times a day (BID) | ORAL | 0 refills | Status: DC

## 2018-06-25 NOTE — Telephone Encounter (Signed)
This is prescribed by Dr Sallyanne Kuster.  Pt advised to reach out to their office for samples first then contact us back if not able to get samples from them.

## 2018-06-25 NOTE — Addendum Note (Signed)
Addended by: Virl Cagey on: 06/25/2018 01:02 PM   Modules accepted: Orders

## 2018-06-25 NOTE — Telephone Encounter (Signed)
Patient calling the office for samples of medication:   1.  What medication and dosage are you requesting samples for? Elquis  2.  Are you currently out of this medication? yes

## 2018-06-25 NOTE — Telephone Encounter (Signed)
Pt wife calling back for Eliquis samples Advised that given we do not have his dose (2.5mg ) we will need to run it by Dr Raliegh Ip for approval.  Pt wife became upset and stated that she did not understand why it is an issue all of a sudden for samples to be given. I explained that given the different dosing we have to get the okay from Dr Raliegh Ip to give higher dose with instructions to cut in half. Wife continued to ask why it was a problem, I explained several times that the patient needed to Contact Dr C for samples as their office prescribes this medication - they refused to contact that office as they have been told in the past that that could get samples at anytime from our office. I explained that since that's not documented on the chart from Dr Raliegh Ip that I would still need to run this by him.   Pt placed on hold.  Spoke with Dr Raliegh Ip. He okayed 5mg  samples to be given today and in the future.  Pt is to be sure to cut in exact halves to ensure getting correct dose each time.  Okayed permanent note to be placed on chart okaying future samples of Eliquis, both 2.5mg  and 5 mg tabs. Note is in Patient Care Coordination and FYI sections of chart.   Spoke with patient wife. Aware that 5mg  samples are up front to be picked up.  Aware that notes is now in chart in hopes to help keep future confusion from taking place.  Wife apologized for speaking hatefully to myself during our previous conversation, she stated that she understands I was just trying to do my job. Wife will come pick up today.   Nothing further needed.

## 2018-06-25 NOTE — Telephone Encounter (Signed)
Medication samples have been provided to the patient.  Drug name: eliquis 2.5mg   Qty: 2 boxes  LOT: [id number]  Exp.Date: July 2020  Samples left at front desk for patient pick-up. Patient notified. He has [employer] but has disability thru [place] so he cannot use copay card  Fidel Levy 10:11 AM 06/25/2018

## 2018-06-25 NOTE — Telephone Encounter (Signed)
Copied from [place] [id number]. Topic: General - Other >> Jun 25, 2018  9:55 AM Judyann Munson wrote: Reason for CRM:  Patient is requesting samples of apixaban (ELIQUIS) 2.5 MG TABS tablet. His best best contact number is [phone]. Pleas advise

## 2018-07-11 ENCOUNTER — Other Ambulatory Visit: Payer: Self-pay | Admitting: Internal Medicine

## 2018-07-11 NOTE — Telephone Encounter (Signed)
Patient last seen by PCP in Aug. 2017. Please advise refill.

## 2018-07-12 ENCOUNTER — Other Ambulatory Visit: Payer: Self-pay | Admitting: Internal Medicine

## 2018-07-12 NOTE — Telephone Encounter (Signed)
Patient last seen by PCP in Aug. 2017. Please advise refill.

## 2018-07-15 DIAGNOSIS — N184 Chronic kidney disease, stage 4 (severe): Secondary | ICD-10-CM | POA: Diagnosis not present

## 2018-07-23 ENCOUNTER — Telehealth: Payer: Self-pay | Admitting: Cardiology

## 2018-07-23 NOTE — Telephone Encounter (Signed)
New Message   Patient calling the office for samples of medication:   1.  What medication and dosage are you requesting samples for? apixaban (ELIQUIS) 2.5 MG TABS tablet  2.  Are you currently out of this medication? yes

## 2018-07-23 NOTE — Telephone Encounter (Signed)
Spoke to pt. Made aware that samples are available and will be at check in when he is ready to pick them up. Pt verbalized thanks and understanding.

## 2018-09-19 ENCOUNTER — Encounter: Payer: Self-pay | Admitting: Family Medicine

## 2018-09-26 ENCOUNTER — Other Ambulatory Visit: Payer: Self-pay | Admitting: Cardiovascular Disease

## 2018-09-26 ENCOUNTER — Other Ambulatory Visit: Payer: Self-pay | Admitting: Internal Medicine

## 2018-10-08 DIAGNOSIS — H40013 Open angle with borderline findings, low risk, bilateral: Secondary | ICD-10-CM | POA: Diagnosis not present

## 2019-01-10 ENCOUNTER — Telehealth: Payer: Self-pay | Admitting: Cardiovascular Disease

## 2019-01-10 MED ORDER — APIXABAN 2.5 MG PO TABS: 2.5000 mg | ORAL_TABLET | Freq: Two times a day (BID) | ORAL | 0 refills | Status: DC

## 2019-01-10 NOTE — Telephone Encounter (Signed)
Informed pt that samples of eliquis 2.5 mg (28 tablets in total) are available for pick up at front desk at [facility]

## 2019-01-10 NOTE — Telephone Encounter (Signed)
Patient calling the office for samples of medication:   1.  What medication and dosage are you requesting samples for?  apixaban (ELIQUIS) 2.5 MG TABS tablet  OR  apixaban (ELIQUIS) 5 MG TABS tablet  Patient requested either one.   2.  Are you currently out of this medication?

## 2019-02-01 ENCOUNTER — Other Ambulatory Visit: Payer: Self-pay | Admitting: Cardiovascular Disease

## 2019-02-27 ENCOUNTER — Telehealth: Payer: Self-pay | Admitting: *Deleted

## 2019-02-27 ENCOUNTER — Telehealth: Payer: Self-pay | Admitting: Cardiovascular Disease

## 2019-02-27 NOTE — Telephone Encounter (Signed)
New Message  Patient calling the office for samples of medication:   1.  What medication and dosage are you requesting samples for? Eliquis 2.5mg  or 5mg    2.  Are you currently out of this medication? Yes

## 2019-02-27 NOTE — Telephone Encounter (Signed)
Called patient, samples are available, he can come pick up. Patient verbalized understanding.

## 2019-02-27 NOTE — Telephone Encounter (Signed)
Copied from [place] [phone]. Topic: General - Other >> Feb 27, 2019 11:56 AM Leward Quan A wrote: Reason for CRM: Patient called to request samples of Eliquis please. Patient asking for a call back  at Ph# [phone]

## 2019-02-28 NOTE — Telephone Encounter (Signed)
Tell him we do not have any samples. He should either ask Cardiology or better yet apply for a special program through the pharmaceutical company

## 2019-02-28 NOTE — Telephone Encounter (Signed)
Called and spoke with pt and he is aware of Dr. Barbie Banner recs.

## 2019-03-20 ENCOUNTER — Telehealth: Payer: Self-pay | Admitting: Cardiovascular Disease

## 2019-03-20 NOTE — Telephone Encounter (Signed)
Patient was called, notified that we were currently out of samples. Patient was notified to call [address] office if they had any by chance, and to call if they did not.  Patient verbalized understanding. Advised I would send to pharm for any recommendations if unable to get medications, he states he cant even get half of the RX because of the price.

## 2019-03-20 NOTE — Telephone Encounter (Signed)
We will provide 2 weeks of Eliquis 5mg  samples at this time.   Patient on disability and unable to pay for Eliquis.  Unwilling to transition to warfarin.  Patient stated he was on warfarin 1st but" became to expensive due to frequent monitoring.   Per patient history he was denied for Eliquis patient assistance too.   Samples of Eliquis 5mg  (to take 1/2 tablet twice daily) were given to the patient, quantity 14, Lot Number [id number]   *LAST office visit was December/2018.  Will forward to cardiologist for additional recommendations* Not candidate for another DOAc due to CKD

## 2019-03-20 NOTE — Telephone Encounter (Signed)
Patient calling the office for samples of medication:   1.  What medication and dosage are you requesting samples for? Eliquis  2.  Are you currently out of this medication?  2 days left

## 2019-03-20 NOTE — Telephone Encounter (Signed)
Thank you, Raquel!

## 2019-03-31 ENCOUNTER — Telehealth: Payer: Self-pay | Admitting: Family Medicine

## 2019-03-31 NOTE — Telephone Encounter (Signed)
pts wife called and stated that her husband was in need of samples of the eliquis.  She stated that he is on disability and he cannot afford this medication.  Dr. Sarajane Jews Please advise. Thanks

## 2019-04-01 NOTE — Telephone Encounter (Signed)
We do not have any samples. He should go online and get a coupon from [employer]

## 2019-04-02 ENCOUNTER — Encounter: Payer: Self-pay | Admitting: *Deleted

## 2019-04-02 NOTE — Telephone Encounter (Signed)
Spoke with pts wife and she stated that the pharmaceutical company denied to help the pt with the medication and he is not able to use the coupon  Any other ideas?  Thanks

## 2019-04-02 NOTE — Telephone Encounter (Signed)
I suggest they ask his cardiologist, Dr. Sallyanne Kuster for samples

## 2019-04-19 ENCOUNTER — Other Ambulatory Visit: Payer: Self-pay | Admitting: Cardiovascular Disease

## 2019-04-21 ENCOUNTER — Telehealth: Payer: Self-pay | Admitting: *Deleted

## 2019-04-21 NOTE — Telephone Encounter (Signed)
Left message for patient to call and schedule overdue visit with Dr. Sallyanne Kuster (virtual/telephone)---patient received 30 day refill on his medicaiton.

## 2019-04-21 NOTE — Telephone Encounter (Signed)
Spoke to patient he stated he is almost out of eliquis.Stated he cannot afford too expensive.Advised office out of eliquis samples.Advised very important he keeps taking.Stated 2 years ago he did not qualify for patient assistance.Advised we will try again to apply for patient assistance.Patient will bring proof of income to office this Wed 4/29 and sign his portion of patient assistance form.Advised to keep taking eliquis and buy what he can afford.

## 2019-04-21 NOTE — Telephone Encounter (Signed)
Follow up    Pt is returning call and says he will like to schedule an appt after the coronavirus clears up.  He is also asking for samples   Patient calling the office for samples of medication:   1.  What medication and dosage are you requesting samples for? Eliquis 2.5 or 5   2.  Are you currently out of this medication?  Pt is not out but will be out soon

## 2019-04-23 ENCOUNTER — Telehealth: Payer: Self-pay | Admitting: Cardiovascular Disease

## 2019-04-23 ENCOUNTER — Telehealth: Payer: Self-pay

## 2019-04-23 NOTE — Telephone Encounter (Signed)
[employer], smartphone, pre reg complete 04/23/19 AF

## 2019-04-23 NOTE — Telephone Encounter (Signed)
[name] Patient Assistance form for Eliquis completed and faxed to fax # [phone].

## 2019-04-24 ENCOUNTER — Telehealth (INDEPENDENT_AMBULATORY_CARE_PROVIDER_SITE_OTHER): Payer: Medicare Other | Admitting: Cardiovascular Disease

## 2019-04-24 ENCOUNTER — Telehealth: Payer: Self-pay | Admitting: Cardiovascular Disease

## 2019-04-24 ENCOUNTER — Encounter: Payer: Self-pay | Admitting: Cardiovascular Disease

## 2019-04-24 VITALS — Ht 72.0 in | Wt 368.0 lb

## 2019-04-24 DIAGNOSIS — I714 Abdominal aortic aneurysm, without rupture, unspecified: Secondary | ICD-10-CM

## 2019-04-24 DIAGNOSIS — I712 Thoracic aortic aneurysm, without rupture: Secondary | ICD-10-CM | POA: Diagnosis not present

## 2019-04-24 DIAGNOSIS — I10 Essential (primary) hypertension: Secondary | ICD-10-CM | POA: Diagnosis not present

## 2019-04-24 DIAGNOSIS — I351 Nonrheumatic aortic (valve) insufficiency: Secondary | ICD-10-CM

## 2019-04-24 DIAGNOSIS — Z7901 Long term (current) use of anticoagulants: Secondary | ICD-10-CM

## 2019-04-24 DIAGNOSIS — Z86718 Personal history of other venous thrombosis and embolism: Secondary | ICD-10-CM

## 2019-04-24 DIAGNOSIS — I7121 Aneurysm of the ascending aorta, without rupture: Secondary | ICD-10-CM

## 2019-04-24 DIAGNOSIS — I5030 Unspecified diastolic (congestive) heart failure: Secondary | ICD-10-CM

## 2019-04-24 DIAGNOSIS — N184 Chronic kidney disease, stage 4 (severe): Secondary | ICD-10-CM | POA: Diagnosis not present

## 2019-04-24 MED ORDER — CARVEDILOL 25 MG PO TABS: 25.0000 mg | ORAL_TABLET | Freq: Two times a day (BID) | ORAL | 3 refills | Status: DC

## 2019-04-24 MED ORDER — OLMESARTAN-AMLODIPINE-HCTZ 40-10-12.5 MG PO TABS: 1.0000 | ORAL_TABLET | Freq: Every day | ORAL | 3 refills | Status: DC

## 2019-04-24 MED ORDER — AMOXICILLIN 500 MG PO CAPS: ORAL_CAPSULE | ORAL | 3 refills | Status: DC

## 2019-04-24 MED ORDER — ATORVASTATIN CALCIUM 10 MG PO TABS: 10.0000 mg | ORAL_TABLET | Freq: Every day | ORAL | 3 refills | Status: DC

## 2019-04-24 MED ORDER — APIXABAN 2.5 MG PO TABS: 2.5000 mg | ORAL_TABLET | Freq: Two times a day (BID) | ORAL | 3 refills | Status: DC

## 2019-04-24 MED ORDER — FUROSEMIDE 40 MG PO TABS: 40.0000 mg | ORAL_TABLET | Freq: Every day | ORAL | 3 refills | Status: DC

## 2019-04-24 NOTE — Telephone Encounter (Signed)
New Message:    Pt's appt is at 2 today, still have not received his link.h;

## 2019-04-24 NOTE — Telephone Encounter (Signed)
Patient already had virtual visit with Dr.Croitoru.

## 2019-04-24 NOTE — Progress Notes (Signed)
Virtual Visit via Video Note   This visit type was conducted due to national recommendations for restrictions regarding the COVID-19 Pandemic (e.g. social distancing) in an effort to limit this patient's exposure and mitigate transmission in our community.  Due to his co-morbid illnesses, this patient is at least at moderate risk for complications without adequate follow up.  This format is felt to be most appropriate for this patient at this time.  All issues noted in this document were discussed and addressed.  A limited physical exam was performed with this format.  Please refer to the patient's chart for his consent to telehealth for [facility].   Evaluation Performed:  Follow-up visit  Date:  04/24/2019   ID:  [name], DOB 1970/12/26, MRN [id number]  Patient Location: Home Provider Location: Home  PCP:  Jack Morale, MD  Cardiologist:  [name] Electrophysiologist:  None   Chief Complaint:  Aortic valve repair f/u, chronic diastolic CHF f/u  History of Present Illness:    [name] is a 48 y.o. male with a history of obesity, severe systemic hypertension, aneurysm of the ascending aorta s/p Yacoub valve sparing root repair and 62mm HAART300 aortic annuloplasty(Dr. Ysidro Evans, [facility], 2018), , history of stent graft repair for abdominal aortic aneurysm and dissection (Dr. Kellie Evans, 2013), bilateral iliac artery aneurysms, hypertensive heart disease with severe left ventricular hypertrophy and well compensated diastolic heart failure, mild to moderate aortic insufficiency, history of DVT and pulmonary embolism on chronic anticoagulation (history of bleeding problems on Xarelto, switched to low-dose Eliquis), chronic kidney disease stage IV (Dr. Justin Evans), large left forearm AV fistula (not needed yet, required resection of an aneurysm in January 2019).  He is feeling remarkably well.  He almost sounds surprised by how well he is doing. The patient specifically denies  any chest pain at rest exertion, dyspnea at rest or with exertion, orthopnea, paroxysmal nocturnal dyspnea, syncope, palpitations, focal neurological deficits, intermittent claudication, lower extremity edema, unexplained weight gain, cough, hemoptysis or wheezing.   Since switching to Eliquis he has not had any more bleeding issues.  He used to wake up with clots in his mouth and spontaneous bleeding from his scalp when he took Xarelto.  He reports that his blood pressure is routinely very well controlled.  He was unable to check it today since his blood pressure cuff is not working as of the last few days.  His typical blood pressure is 110-115/75-80 mmHg.  The cardiac MRI performed last year at [facility] shows a severely hypertrophic left ventricle (LVEDD 4.9 cm, intraventricular septum 2 cm, posterior wall 1.7 cm, LV mass 382 g) with hyperdynamic systolic function (EF 70%), left atrial dilation (end-systolic diameter 4.3 cm), intact aortic valve repair without regurgitation and with increased ejection velocity to a peak of 2.9 m/second.  There was no evidence of iron overload, but his native T1 time of 1421 ms was borderline elevated and raised the concern for possible cardiac amyloidosis.  Gadolinium was not administered (creatinine 3.27).  His renal function has been stable (GFR around 25 mL/minute) and he has not required hemodialysis.  His nephrologist is Dr. Edrick Evans.  The patient does not have symptoms concerning for COVID-19 infection (fever, chills, cough, or new shortness of breath).    Past Medical History:  Diagnosis Date  . Abdominal aortic aneurysm ([place])   . Anemia   . Aortic regurgitation 12/10/08   Echo: mild to mod. AOV regurg,mild AO root dilatation, LA mildly dilated, EF 65%,  LV wall thickness markedly increased, small PE  . Chronic kidney disease   . Chronic renal disease    advanced  . DVT (deep venous thrombosis) ([place])   . H/O aortic dissection 2009   infarenal  abdominal aortic dissection  . History of pulmonary embolus (PE) 2010  . Hypertension    dr [name]  . Left ventricular hypertrophy   . Obesity   . Stroke [facility]) 2009   pt says no stroke   Past Surgical History:  Procedure Laterality Date  . ABDOMINAL AORTAGRAM N/A 02/06/2012   Procedure: ABDOMINAL Maxcine Ham;  Surgeon: [name], MD;  Location: [facility] C[facility] LAB;  Service: Cardiovascular;  Laterality: N/A;  . ABDOMINAL AORTIC ANEURYSM REPAIR  09/25/2012   EVAR  . AORTIC ROOT REPLACEMENT  05/07/2017   s/p valve sparing root replacement with coronary reconstruction along with repair of aortic valve with a 21MM HAART 300 aortic ring annuloplasty at [facility]  . AV FISTULA PLACEMENT Left 05/19/2015   Procedure: ARTERIOVENOUS (AV) FISTULA CREATION-LEFT RADIOCEPHALIC;  Surgeon: Mal [name], MD;  Location: [place];  Service: Vascular;  Laterality: Left;  . CARDIAC CATHETERIZATION  02/11/10   false + Nuc  . CORNEAL TRANSPLANT    . FISTULA SUPERFICIALIZATION Left 08/04/2015   Procedure: FISTULA SUPERFICIALIZATION LEFT RADIAL CEPHALIC;  Surgeon: Mal [name], MD;  Location: [place];  Service: Vascular;  Laterality: Left;  . INTRAVASCULAR ULTRASOUND  09/25/2012   Procedure: INTRAVASCULAR ULTRASOUND;  Surgeon: Mal [name], MD;  Location: [place];  Service: Vascular;  Laterality: N/A;  . LIGATION OF COMPETING BRANCHES OF ARTERIOVENOUS FISTULA Left 08/04/2015   Procedure: LIGATION OF MULTIPLE COMPETING BRANCHES OF ARTERIOVENOUS FISTULA;  Surgeon: Mal [name], MD;  Location: [place];  Service: Vascular;  Laterality: Left;  . RESECTION OF ARTERIOVENOUS FISTULA ANEURYSM Left 01/02/2018   Procedure: RESECTION OF ARTERIOVENOUS FISTULA ANEURYSM LEFT RADIOCEPHALIC;  Surgeon: [name], MD;  Location: [place];  Service: Cardiovascular;  Laterality: Left;     Current Meds  Medication Sig  . [DISCONTINUED] allopurinol (ZYLOPRIM) 300 MG tablet TAKE 1 TABLET BY MOUTH  DAILY     Allergies:   Roxicodone [oxycodone hcl]    Social History   Tobacco Use  . Smoking status: Former Smoker    Years: 3.00    Types: Cigarettes    Last attempt to quit: 01/25/1991    Years since quitting: 28.2  . Smokeless tobacco: Never Used  Substance Use Topics  . Alcohol use: No    Alcohol/week: 0.0 standard drinks  . Drug use: No     Family Hx: The patient's family history includes Hypertension in his father and mother.  ROS:   Please see the history of present illness.     All other systems reviewed and are negative.   Prior CV studies:   The following studies were reviewed today: Notes from his follow-up visit with Dr. Ysidro Evans May 2019, CT of the aorta with stable aortic repair same date  Labs/Other Tests and Data Reviewed:    EKG:  An ECG dated 06/25/2017 was personally reviewed today and demonstrated:  Sinus rhythm, left atrial abnormality, left ventricular hypertrophy, prolonged QTC 495 ms  Recent Labs: No results found for requested labs within last 8760 hours.   Recent Lipid Panel Lab Results  Component Value Date/Time   CHOL 140 02/29/2012 08:29 AM   TRIG 72 02/29/2012 08:29 AM   HDL 42 02/29/2012 08:29 AM   CHOLHDL 3.3 02/29/2012 08:29 AM  [facility] 84 02/29/2012 08:29 AM    Wt Readings from Last 3 Encounters:  02/05/18 (!) 368 lb (166.9 kg)  01/02/18 (!) 366 lb 3.2 oz (166.1 kg)  12/13/17 (!) 366 lb 3.2 oz (166.1 kg)     Objective:    Vital Signs:  Ht 6' (1.829 m)   Wt (!) 368 lb (166.9 kg)   BMI 49.91 kg/m    VITAL SIGNS:  reviewed GEN:  no acute distress EYES:  sclerae anicteric, EOMI - Extraocular Movements Intact RESPIRATORY:  normal respiratory effort, symmetric expansion CARDIOVASCULAR:  no peripheral edema SKIN:  no rash, lesions or ulcers. MUSCULOSKELETAL:  no obvious deformities. NEURO:  alert and oriented x 3, no obvious focal deficit PSYCH:  normal affect Morbidly obese.  Prominent left forearm AV fistula  ASSESSMENT & PLAN:    1. CHF: Chronic diastolic heart failure  due to severe left ventricular hypertrophy, at least partly due to longstanding hypertension that was insufficiently treated.  His MRI findings do raise concern for possible cardiac amyloidosis, but I believe that the course of his illness has been very benign compared to the typical course of the most common cases of hereditary or wild-type TTR amyloidosis, definitely to benign of a course to consider AL amyloidosis.  When the coronavirus restrictions are lifted, would like to bring him in for a pyrophosphate scan.  In the meantime he is very well compensated and appears to be euvolemic, functional class I.  Encouraged more physical activity. 2. HTN: Reports very good control. 3. Asc Ao aneurysm: s/p repair 2018.  Good findings at cardiac MRI in May 2019, follow-up at [facility] as planned, but probably delayed to due to the coronavirus epidemic. 4. AI: s/p aortic valve annuloplasty repair with a 21 mm HAART 300 ring.  Mild iatrogenic aortic stenosis, not likely to be hemodynamically significant. 5. AAA:  History of graft repair, monitored by Dr. Trula Slade.  Unable to administer contrast for follow-up has been with CT without contrast or ultrasound. 6. Iliac aneurysm: as above 7. CKD 4: Remarkably stable creatinine level.  He has a fistula ready for dialysis in the future. 8. DVT was complicated by pulmonary embolism.   Plan lifelong anticoagulation. 9. Eliquis: no bleeding problems (which did occur with Xarelto) 10. Super-obesity: As always, weight reduction should be a major long-term priority.   Encouraged him to walk more and to restrict his caloric intake.  COVID-19 Education: The signs and symptoms of COVID-19 were discussed with the patient and how to seek care for testing (follow up with PCP or arrange E-visit).  The importance of social distancing was discussed today.  Time:   Today, I have spent 31 minutes with the patient with telehealth technology discussing the above problems.     Medication  Adjustments/Labs and Tests Ordered: Current medicines are reviewed at length with the patient today.  Concerns regarding medicines are outlined above.   Tests Ordered: No orders of the defined types were placed in this encounter.   Medication Changes: No orders of the defined types were placed in this encounter.   Disposition:  Follow up 12 months  Signed, Sanda Klein, MD  04/24/2019 1:57 PM    [place]

## 2019-04-24 NOTE — Patient Instructions (Signed)
Medication Instructions:  Continue same medications If you need a refill on your cardiac medications before your next appointment, please call your pharmacy.   Lab work: Lipid panel,Cmet   Have Lab done at our office [place]  No appointment needed  Lab opens at 8:00 am to 12:00 noon  Nothing to eat or drink after midnight When you walk in office walk to the right of check in desk you will see a [facility] check in stand sign in and ring door bell take a seat Lab tech will call you back.   Testing/Procedures: None ordered  Follow-Up: At [facility], you and your health needs are our priority.  As part of our continuing mission to provide you with exceptional heart care, we have created designated Provider Care Teams.  These Care Teams include your primary Cardiologist (physician) and Advanced Practice Providers (APPs -  Physician Assistants and Nurse Practitioners) who all work together to provide you with the care you need, when you need it. . Scheduled follow up appointment with Dr.Croitoru in 12 months    Call 3 months before to schedule.

## 2019-04-25 NOTE — Telephone Encounter (Signed)
See 4/29 telephone note.

## 2019-05-30 ENCOUNTER — Telehealth: Payer: Self-pay | Admitting: Cardiovascular Disease

## 2019-05-30 NOTE — Telephone Encounter (Signed)
Medication samples have been provided to the patient.  Drug name: Eliquis 2.5 mg  Qty: 2 boxes  LOT: [id number]  Exp.Date: Sept 2021  Samples left at front desk for patient pick-up. Patient notified.

## 2019-05-30 NOTE — Telephone Encounter (Signed)
New Message   Patient calling the office for samples of medication:   1.  What medication and dosage are you requesting samples for? apixaban (ELIQUIS) 2.5 MG TABS tablet    2.  Are you currently out of this medication? No have 2 remaining

## 2019-06-13 ENCOUNTER — Telehealth: Payer: Self-pay | Admitting: *Deleted

## 2019-06-13 NOTE — Telephone Encounter (Signed)
Notification has been received that the patient's application for assistance for Eliquis has been denied due to the medication being covered by insurance.

## 2019-06-20 ENCOUNTER — Telehealth: Payer: Self-pay

## 2019-06-20 ENCOUNTER — Telehealth: Payer: Self-pay | Admitting: Cardiovascular Disease

## 2019-06-20 NOTE — Telephone Encounter (Signed)
Dr. Fry please advise. Thanks  

## 2019-06-20 NOTE — Telephone Encounter (Signed)
Tiffany is handling in PharmD.

## 2019-06-20 NOTE — Telephone Encounter (Signed)
Sorry no samples available

## 2019-06-20 NOTE — Telephone Encounter (Signed)
Copied from [place] [id number]. Topic: General - Inquiry >> Jun 20, 2019  1:48 PM Jeri Cos wrote: Reason for CRM: Pt called wanting to know if there was any free samples of apixaban (ELIQUIS) 2.5 MG TABS tablet available for him. Pt would like to come pick them if if so.

## 2019-06-20 NOTE — Telephone Encounter (Signed)
Patient calling the office for samples of medication:   1.  What medication and dosage are you requesting samples for? eliquis 2.5 mg  2.  Are you currently out of this medication?  No. Patient only has 2 days worth of medication left

## 2019-06-20 NOTE — Telephone Encounter (Signed)
Samples of Eliquis 2.5mg   were given to the patient, quantity 42 tablets, Lot Number [id number], Exp 08/2020. Spouse aware to pick up.

## 2019-06-23 ENCOUNTER — Encounter: Payer: Self-pay | Admitting: *Deleted

## 2019-06-24 ENCOUNTER — Other Ambulatory Visit: Payer: Self-pay

## 2019-06-24 NOTE — Patient Outreach (Signed)
[facility]) Care Management  06/24/2019  TANIA PERROTT [birth year] [id number]   Medication Adherence call to Mr. Diamond Martucci Telephone call to Patient regarding Medication Adherence unable to reach patient. Mr. Wassink is showing past due on Olmesartan/Amlodipine/Hctz under [employer].   [place] Management Direct Dial [phone]  Fax [phone] [email or web address]

## 2019-06-30 ENCOUNTER — Other Ambulatory Visit: Payer: Self-pay

## 2019-06-30 MED ORDER — OLMESARTAN-AMLODIPINE-HCTZ 40-10-12.5 MG PO TABS: 1.0000 | ORAL_TABLET | Freq: Every day | ORAL | 3 refills | Status: DC

## 2019-07-02 ENCOUNTER — Encounter: Payer: Self-pay | Admitting: Family Medicine

## 2019-07-02 NOTE — Telephone Encounter (Signed)
Dr. Fry please advise. Thanks  

## 2019-07-03 NOTE — Telephone Encounter (Signed)
Yes he can take this if he wishes

## 2019-07-22 ENCOUNTER — Telehealth: Payer: Self-pay | Admitting: Cardiovascular Disease

## 2019-07-22 NOTE — Telephone Encounter (Signed)
Confirmed Eliquis 2.5 mg BID  Advised #2 Lot [id number] exp 9/21 at front for pick up  Message sent to [employer] / [place] to reach out to patient for possible assistance with Eliquis. Patient denied patient assistance secondary to having drug coverage however his copay is over $200 a month

## 2019-07-22 NOTE — Telephone Encounter (Signed)
Triage question

## 2019-07-22 NOTE — Telephone Encounter (Signed)
New Message    Patient calling the office for samples of medication:   1.  What medication and dosage are you requesting samples for?  apixaban (ELIQUIS) 2.5 MG TABS tablet  2.  Are you currently out of this medication? Yes, only has a day supply left

## 2019-07-22 NOTE — Telephone Encounter (Signed)
Please review for refill. Thanks!  

## 2019-07-23 ENCOUNTER — Telehealth (HOSPITAL_COMMUNITY): Payer: Self-pay | Admitting: Licensed Clinical Social Worker

## 2019-07-23 NOTE — Telephone Encounter (Signed)
CSW consulted to help with pt cost concerns for Eliquis.  CSW spoke with pt and pt wife.  They report that current copay for Eliquis is $200/month and has been since the beginning of the year.  States this is not affordable due to limited income ($1,100/month on disability).  CSW discussed possible tier exception for patient to possibly reduce copay cost- CSW sent request to MD office to have tier exception completed  Pt also reports he already applied for assistance through [employer] but was denied.  CSW spoke with Roosvelt Harps who confirms pt was denied.  Since pt has part D insurance he would have to meet copay requirement which is almost $400.  Patient has not spent anything on medications this year as he gets the rest of his medications for free from his mail order pharmacy.  CSW also messaged pt MD clinic to inquire about more Eliquis samples until this is figured out  CSW will continue to follow and assist as needed  Jorge Ny, [place] [facility] Desk#: [phone] Cell#: [phone]

## 2019-07-24 ENCOUNTER — Telehealth (HOSPITAL_COMMUNITY): Payer: Self-pay | Admitting: Licensed Clinical Social Worker

## 2019-07-24 ENCOUNTER — Telehealth: Payer: Self-pay | Admitting: Cardiovascular Disease

## 2019-07-24 NOTE — Telephone Encounter (Signed)
Message left with [name] that I will start working on the tier exception when I am back in the office next week.   Samples had been provided for the patient on 07/22/2019.

## 2019-07-24 NOTE — Telephone Encounter (Signed)
CSW spoke with RN at Dr. Victorino December office regarding tier exception.  They will be able to start this process on Monday and will update CSW regarding progress.    CSW called pt and left message updating on progress.  CSW will continue to follow and assist as needed  Jorge Ny, [place] [facility] Desk#: [phone] Cell#: [phone]

## 2019-07-24 NOTE — Telephone Encounter (Signed)
New message   Per [name] patient needs a [place] for Eliquis. [name] states that he only has a weeks worth of medication left. Please call.

## 2019-07-31 NOTE — Telephone Encounter (Signed)
[employer] have been called to start the tier exception. They stated that they will send a form to the office to be filled out for the Eliquis.

## 2019-08-01 NOTE — Telephone Encounter (Signed)
Attempted to reach the patient. Was unable to leave a message.  The Tier exception for Eliquis has been completed and faxed.

## 2019-08-14 ENCOUNTER — Telehealth: Payer: Self-pay | Admitting: Cardiovascular Disease

## 2019-08-14 NOTE — Telephone Encounter (Signed)
New Message      Patient calling the office for samples of medication:   1.  What medication and dosage are you requesting samples for? Eliquis 2.5 or 5 mg   2.  Are you currently out of this medication?  Yes pt is out

## 2019-08-14 NOTE — Telephone Encounter (Signed)
Spoke with pt, aware no samples available at this time.

## 2019-08-27 ENCOUNTER — Encounter: Payer: Self-pay | Admitting: Family Medicine

## 2019-08-27 ENCOUNTER — Ambulatory Visit (INDEPENDENT_AMBULATORY_CARE_PROVIDER_SITE_OTHER): Payer: Medicare Other | Admitting: Family Medicine

## 2019-08-27 VITALS — BP 150/82 | HR 73 | Temp 98.7°F | Wt 364.6 lb

## 2019-08-27 DIAGNOSIS — I10 Essential (primary) hypertension: Secondary | ICD-10-CM | POA: Diagnosis not present

## 2019-08-27 DIAGNOSIS — N184 Chronic kidney disease, stage 4 (severe): Secondary | ICD-10-CM

## 2019-08-27 DIAGNOSIS — R040 Epistaxis: Secondary | ICD-10-CM

## 2019-08-27 DIAGNOSIS — Z86718 Personal history of other venous thrombosis and embolism: Secondary | ICD-10-CM | POA: Diagnosis not present

## 2019-08-27 NOTE — Progress Notes (Signed)
   Subjective:    Patient ID: [name], male    DOB: [birth year], 48 y.o.   MRN: [id number]  HPI Here with his wife to discuss nosebleeds. He has been having recurrent nosebleeds out of both nostrils for the past 3 months. There is no pain. His BP has been stable. He has not taken any Eliquis for the past 30 days because the cardiology office has not had any samples. He has not seen Nephrologist since February 2019.    Review of Systems  Constitutional: Negative.   HENT: Positive for nosebleeds. Negative for sinus pressure and sinus pain.   Respiratory: Negative.   Cardiovascular: Negative.   Neurological: Negative.        Objective:   Physical Exam Constitutional:      Appearance: Normal appearance. He is not ill-appearing.  HENT:     Nose:     Comments: The right nostril has a small clot of blood on the septum, the left side is clear  Cardiovascular:     Rate and Rhythm: Normal rate and regular rhythm.     Pulses: Normal pulses.     Heart sounds: Normal heart sounds.  Pulmonary:     Effort: Pulmonary effort is normal.     Breath sounds: Normal breath sounds.  Neurological:     Mental Status: He is alert.           Assessment & Plan:  Nosebleeds, we will refer him to ENT to cauterize the bleeding vessels in his nose. I asked him to keep in touch with the cardiology office about Eliquis samples. I asked him to get in to see Nephrology asap.  Alysia Penna, MD

## 2019-09-12 DIAGNOSIS — D631 Anemia in chronic kidney disease: Secondary | ICD-10-CM | POA: Diagnosis not present

## 2019-09-12 DIAGNOSIS — N184 Chronic kidney disease, stage 4 (severe): Secondary | ICD-10-CM | POA: Diagnosis not present

## 2019-09-12 DIAGNOSIS — N189 Chronic kidney disease, unspecified: Secondary | ICD-10-CM | POA: Diagnosis not present

## 2019-09-12 DIAGNOSIS — I719 Aortic aneurysm of unspecified site, without rupture: Secondary | ICD-10-CM | POA: Diagnosis not present

## 2019-09-12 DIAGNOSIS — I770 Arteriovenous fistula, acquired: Secondary | ICD-10-CM | POA: Diagnosis not present

## 2019-09-12 DIAGNOSIS — N2581 Secondary hyperparathyroidism of renal origin: Secondary | ICD-10-CM | POA: Diagnosis not present

## 2019-09-22 ENCOUNTER — Telehealth: Payer: Self-pay | Admitting: *Deleted

## 2019-09-22 NOTE — Telephone Encounter (Signed)
Call placed to [employer] to check the status of the tier exception. They have asked that it be re-faxed to [phone].  The patient has been called and made aware. Samples of 5 mg Eliquis have been given with the instruction to cut them in half (okay per pharmd)  Medication samples have been provided to the patient.  Drug name: Eliquis       Strength: 5 mg        Qty: 2 boxes  LOT: [id number]  Exp.Date: 11/2021

## 2019-09-23 ENCOUNTER — Telehealth (HOSPITAL_COMMUNITY): Payer: Self-pay | Admitting: Licensed Clinical Social Worker

## 2019-09-23 NOTE — Telephone Encounter (Signed)
CSW informed that tier exception was unsuccessful.  CSW reached out to pt to make sure there were no other options for Eliquis assistance.  CSW confirmed that pt has still not had to pay any copays for his other mediations which means he would not have met the $400 copay requirement for [employer] assistance.    CSW also discussed possibility of LIS program which could help reduce copay costs but patient and wife make too much to qualify.  Unfortunately not other options at this time- CSW messaged patient cardiology office to inform in case patient is able to transition to a less expensive medication  Jorge Ny, [place] [facility] Desk#: [phone] Cell#: [phone]

## 2019-09-23 NOTE — Telephone Encounter (Signed)
Notice has been received that the patient's tier exception has been denied due to the medication being excluded from a tier exception.   The patient has been advised and verbalized his understanding.

## 2019-10-10 ENCOUNTER — Encounter (HOSPITAL_COMMUNITY): Payer: Medicare Other

## 2019-10-16 ENCOUNTER — Other Ambulatory Visit (HOSPITAL_COMMUNITY): Payer: Self-pay | Admitting: *Deleted

## 2019-10-17 ENCOUNTER — Ambulatory Visit (HOSPITAL_COMMUNITY)
Admission: RE | Admit: 2019-10-17 | Discharge: 2019-10-17 | Disposition: A | Discharge status: Home / Self Care | Payer: Medicare Other | Source: Ambulatory Visit | Attending: Nephrology | Admitting: Nephrology

## 2019-10-17 ENCOUNTER — Other Ambulatory Visit: Payer: Self-pay

## 2019-10-17 VITALS — BP 139/90 | HR 79 | Resp 20

## 2019-10-17 DIAGNOSIS — R5383 Other fatigue: Secondary | ICD-10-CM | POA: Diagnosis not present

## 2019-10-17 DIAGNOSIS — D649 Anemia, unspecified: Secondary | ICD-10-CM | POA: Diagnosis not present

## 2019-10-17 DIAGNOSIS — Z952 Presence of prosthetic heart valve: Secondary | ICD-10-CM | POA: Diagnosis not present

## 2019-10-17 DIAGNOSIS — Z7901 Long term (current) use of anticoagulants: Secondary | ICD-10-CM | POA: Diagnosis not present

## 2019-10-17 DIAGNOSIS — Z9889 Other specified postprocedural states: Secondary | ICD-10-CM | POA: Diagnosis not present

## 2019-10-17 DIAGNOSIS — Z48812 Encounter for surgical aftercare following surgery on the circulatory system: Secondary | ICD-10-CM | POA: Diagnosis not present

## 2019-10-17 DIAGNOSIS — R0600 Dyspnea, unspecified: Secondary | ICD-10-CM | POA: Diagnosis not present

## 2019-10-17 DIAGNOSIS — I7789 Other specified disorders of arteries and arterioles: Secondary | ICD-10-CM | POA: Diagnosis not present

## 2019-10-17 DIAGNOSIS — I712 Thoracic aortic aneurysm, without rupture: Secondary | ICD-10-CM | POA: Diagnosis not present

## 2019-10-17 DIAGNOSIS — Z8679 Personal history of other diseases of the circulatory system: Secondary | ICD-10-CM | POA: Diagnosis not present

## 2019-10-17 DIAGNOSIS — N184 Chronic kidney disease, stage 4 (severe): Secondary | ICD-10-CM | POA: Insufficient documentation

## 2019-10-17 DIAGNOSIS — I517 Cardiomegaly: Secondary | ICD-10-CM | POA: Diagnosis not present

## 2019-10-17 LAB — POCT HEMOGLOBIN-HEMACUE: Hemoglobin: 9.4 g/dL — ABNORMAL LOW (ref 13.0–17.0)

## 2019-10-17 MED ORDER — EPOETIN ALFA-EPBX 2000 UNIT/ML IJ SOLN
2000.0000 [IU] | Freq: Once | INTRAMUSCULAR | Status: AC
  Administered 2019-10-17: 5000 [IU] via SUBCUTANEOUS
  Filled 2019-10-17: qty 1

## 2019-10-17 MED ORDER — EPOETIN ALFA-EPBX 10000 UNIT/ML IJ SOLN
5000.0000 [IU] | INTRAMUSCULAR | Status: DC
  Filled 2019-10-17: qty 1

## 2019-10-17 MED ORDER — EPOETIN ALFA-EPBX 3000 UNIT/ML IJ SOLN
3000.0000 [IU] | Freq: Once | INTRAMUSCULAR | Status: DC
  Filled 2019-10-17: qty 1

## 2019-10-17 NOTE — Discharge Instructions (Signed)

## 2019-10-20 DIAGNOSIS — Z8679 Personal history of other diseases of the circulatory system: Secondary | ICD-10-CM | POA: Diagnosis not present

## 2019-10-20 DIAGNOSIS — Z9889 Other specified postprocedural states: Secondary | ICD-10-CM | POA: Diagnosis not present

## 2019-10-20 DIAGNOSIS — D631 Anemia in chronic kidney disease: Secondary | ICD-10-CM | POA: Diagnosis not present

## 2019-10-20 DIAGNOSIS — N189 Chronic kidney disease, unspecified: Secondary | ICD-10-CM | POA: Diagnosis not present

## 2019-10-20 DIAGNOSIS — N185 Chronic kidney disease, stage 5: Secondary | ICD-10-CM | POA: Diagnosis not present

## 2019-10-20 DIAGNOSIS — N184 Chronic kidney disease, stage 4 (severe): Secondary | ICD-10-CM | POA: Diagnosis not present

## 2019-10-20 DIAGNOSIS — N2581 Secondary hyperparathyroidism of renal origin: Secondary | ICD-10-CM | POA: Diagnosis not present

## 2019-10-27 ENCOUNTER — Telehealth: Payer: Self-pay | Admitting: Cardiovascular Disease

## 2019-10-27 NOTE — Telephone Encounter (Signed)
Patient calling the office for samples of medication:   1.  What medication and dosage are you requesting samples for? apixaban (ELIQUIS) 2.5 MG TABS tablet   2.  Are you currently out of this medication? Yes    

## 2019-10-27 NOTE — Telephone Encounter (Signed)
Called and let pt know Eliquis samples are at the front desk.

## 2019-10-31 ENCOUNTER — Ambulatory Visit (HOSPITAL_COMMUNITY)
Admission: RE | Admit: 2019-10-31 | Discharge: 2019-10-31 | Disposition: A | Discharge status: Home / Self Care | Payer: Medicare Other | Source: Ambulatory Visit | Attending: Nephrology | Admitting: Nephrology

## 2019-10-31 VITALS — BP 139/87 | HR 79 | Temp 98.6°F | Resp 18

## 2019-10-31 DIAGNOSIS — N184 Chronic kidney disease, stage 4 (severe): Secondary | ICD-10-CM

## 2019-10-31 LAB — POCT HEMOGLOBIN-HEMACUE: Hemoglobin: 9.3 g/dL — ABNORMAL LOW (ref 13.0–17.0)

## 2019-10-31 MED ORDER — EPOETIN ALFA-EPBX 3000 UNIT/ML IJ SOLN
3000.0000 [IU] | Freq: Once | INTRAMUSCULAR | Status: AC
  Administered 2019-10-31: 3000 [IU] via SUBCUTANEOUS
  Filled 2019-10-31: qty 1

## 2019-10-31 MED ORDER — EPOETIN ALFA-EPBX 2000 UNIT/ML IJ SOLN
2000.0000 [IU] | Freq: Once | INTRAMUSCULAR | Status: AC
  Administered 2019-10-31: 14:00:00 2000 [IU] via SUBCUTANEOUS
  Filled 2019-10-31: qty 1

## 2019-10-31 MED ORDER — EPOETIN ALFA-EPBX 10000 UNIT/ML IJ SOLN: 5000.0000 [IU] | INTRAMUSCULAR | Status: DC

## 2019-11-11 ENCOUNTER — Telehealth: Payer: Self-pay | Admitting: Cardiovascular Disease

## 2019-11-11 NOTE — Telephone Encounter (Signed)
Eliquis 5 mg LOT [id number] exp 8/22  2 boxes at front desk

## 2019-11-11 NOTE — Telephone Encounter (Signed)
Patient calling the office for samples of medication:   1.  What medication and dosage are you requesting samples for? [name] not sure   2.  Are you currently out of this medication? Yes

## 2019-11-11 NOTE — Telephone Encounter (Signed)
Patient aware samples are at the front desk for pick up  

## 2019-11-14 ENCOUNTER — Other Ambulatory Visit: Payer: Self-pay

## 2019-11-14 ENCOUNTER — Ambulatory Visit (HOSPITAL_COMMUNITY)
Admission: RE | Admit: 2019-11-14 | Discharge: 2019-11-14 | Disposition: A | Discharge status: Home / Self Care | Payer: Medicare Other | Source: Ambulatory Visit | Attending: Nephrology | Admitting: Nephrology

## 2019-11-14 VITALS — BP 139/76 | HR 83 | Temp 95.6°F | Resp 18

## 2019-11-14 DIAGNOSIS — N184 Chronic kidney disease, stage 4 (severe): Secondary | ICD-10-CM

## 2019-11-14 LAB — POCT HEMOGLOBIN-HEMACUE: Hemoglobin: 9.5 g/dL — ABNORMAL LOW (ref 13.0–17.0)

## 2019-11-14 LAB — IRON AND TIBC
Iron: 39 ug/dL — ABNORMAL LOW (ref 45–182)
Saturation Ratios: 16 % — ABNORMAL LOW (ref 17.9–39.5)
TIBC: 237 ug/dL — ABNORMAL LOW (ref 250–450)
UIBC: 198 ug/dL

## 2019-11-14 LAB — FERRITIN: Ferritin: 177 ng/mL (ref 24–336)

## 2019-11-14 MED ORDER — EPOETIN ALFA-EPBX 10000 UNIT/ML IJ SOLN: 5000.0000 [IU] | INTRAMUSCULAR | Status: DC

## 2019-11-14 MED ORDER — EPOETIN ALFA-EPBX 2000 UNIT/ML IJ SOLN
2000.0000 [IU] | Freq: Once | INTRAMUSCULAR | Status: AC
  Administered 2019-11-14: 2000 [IU] via SUBCUTANEOUS
  Filled 2019-11-14: qty 1

## 2019-11-14 MED ORDER — EPOETIN ALFA-EPBX 3000 UNIT/ML IJ SOLN
3000.0000 [IU] | Freq: Once | INTRAMUSCULAR | Status: AC
  Administered 2019-11-14: 3000 [IU] via SUBCUTANEOUS
  Filled 2019-11-14: qty 1

## 2019-11-27 ENCOUNTER — Other Ambulatory Visit (HOSPITAL_COMMUNITY): Payer: Self-pay | Admitting: *Deleted

## 2019-11-27 DIAGNOSIS — N2581 Secondary hyperparathyroidism of renal origin: Secondary | ICD-10-CM | POA: Diagnosis not present

## 2019-11-27 DIAGNOSIS — D631 Anemia in chronic kidney disease: Secondary | ICD-10-CM | POA: Diagnosis not present

## 2019-11-27 DIAGNOSIS — N185 Chronic kidney disease, stage 5: Secondary | ICD-10-CM | POA: Diagnosis not present

## 2019-11-27 DIAGNOSIS — Z8679 Personal history of other diseases of the circulatory system: Secondary | ICD-10-CM | POA: Diagnosis not present

## 2019-11-27 DIAGNOSIS — N189 Chronic kidney disease, unspecified: Secondary | ICD-10-CM | POA: Diagnosis not present

## 2019-11-27 DIAGNOSIS — Z9889 Other specified postprocedural states: Secondary | ICD-10-CM | POA: Diagnosis not present

## 2019-11-28 ENCOUNTER — Ambulatory Visit (HOSPITAL_COMMUNITY)
Admission: RE | Admit: 2019-11-28 | Discharge: 2019-11-28 | Disposition: A | Discharge status: Home / Self Care | Payer: Medicare Other | Source: Ambulatory Visit | Attending: Nephrology | Admitting: Nephrology

## 2019-11-28 ENCOUNTER — Other Ambulatory Visit: Payer: Self-pay

## 2019-11-28 VITALS — BP 127/82 | HR 73 | Temp 97.0°F | Resp 20 | Ht 71.0 in | Wt 375.0 lb

## 2019-11-28 DIAGNOSIS — N184 Chronic kidney disease, stage 4 (severe): Secondary | ICD-10-CM | POA: Diagnosis not present

## 2019-11-28 LAB — POCT HEMOGLOBIN-HEMACUE: Hemoglobin: 9.5 g/dL — ABNORMAL LOW (ref 13.0–17.0)

## 2019-11-28 MED ORDER — EPOETIN ALFA-EPBX 10000 UNIT/ML IJ SOLN
5000.0000 [IU] | INTRAMUSCULAR | Status: DC
  Administered 2019-11-28: 5000 [IU] via SUBCUTANEOUS
  Filled 2019-11-28: qty 1

## 2019-11-28 MED ORDER — SODIUM CHLORIDE 0.9 % IV SOLN
510.0000 mg | INTRAVENOUS | Status: DC
  Administered 2019-11-28: 510 mg via INTRAVENOUS
  Filled 2019-11-28: qty 17

## 2019-11-28 NOTE — Discharge Instructions (Signed)

## 2019-12-05 ENCOUNTER — Encounter (HOSPITAL_COMMUNITY)
Admission: RE | Admit: 2019-12-05 | Discharge: 2019-12-05 | Disposition: A | Discharge status: Home / Self Care | Payer: Medicare Other | Source: Ambulatory Visit | Attending: Nephrology | Admitting: Nephrology

## 2019-12-05 DIAGNOSIS — N189 Chronic kidney disease, unspecified: Secondary | ICD-10-CM | POA: Insufficient documentation

## 2019-12-05 DIAGNOSIS — D631 Anemia in chronic kidney disease: Secondary | ICD-10-CM | POA: Insufficient documentation

## 2019-12-05 MED ORDER — SODIUM CHLORIDE 0.9 % IV SOLN
510.0000 mg | INTRAVENOUS | Status: DC
  Administered 2019-12-05: 510 mg via INTRAVENOUS
  Filled 2019-12-05: qty 17

## 2019-12-12 ENCOUNTER — Other Ambulatory Visit: Payer: Self-pay

## 2019-12-12 ENCOUNTER — Ambulatory Visit (HOSPITAL_COMMUNITY)
Admission: RE | Admit: 2019-12-12 | Discharge: 2019-12-12 | Disposition: A | Discharge status: Home / Self Care | Payer: Medicare Other | Source: Ambulatory Visit | Attending: Nephrology | Admitting: Nephrology

## 2019-12-12 VITALS — BP 135/91 | HR 73 | Temp 95.9°F | Resp 18

## 2019-12-12 DIAGNOSIS — N184 Chronic kidney disease, stage 4 (severe): Secondary | ICD-10-CM | POA: Diagnosis not present

## 2019-12-12 LAB — IRON AND TIBC
Iron: 43 ug/dL — ABNORMAL LOW (ref 45–182)
Saturation Ratios: 23 % (ref 17.9–39.5)
TIBC: 185 ug/dL — ABNORMAL LOW (ref 250–450)
UIBC: 142 ug/dL

## 2019-12-12 LAB — FERRITIN: Ferritin: 388 ng/mL — ABNORMAL HIGH (ref 24–336)

## 2019-12-12 LAB — POCT HEMOGLOBIN-HEMACUE: Hemoglobin: 9.6 g/dL — ABNORMAL LOW (ref 13.0–17.0)

## 2019-12-12 MED ORDER — EPOETIN ALFA-EPBX 10000 UNIT/ML IJ SOLN
5000.0000 [IU] | INTRAMUSCULAR | Status: DC
  Administered 2019-12-12: 5000 [IU] via SUBCUTANEOUS

## 2019-12-12 MED ORDER — EPOETIN ALFA-EPBX 3000 UNIT/ML IJ SOLN
INTRAMUSCULAR | Status: AC
  Filled 2019-12-12: qty 1

## 2019-12-12 MED ORDER — EPOETIN ALFA-EPBX 2000 UNIT/ML IJ SOLN
INTRAMUSCULAR | Status: AC
  Filled 2019-12-12: qty 1

## 2019-12-15 MED FILL — Epoetin Alfa-epbx Inj 3000 Unit/ML: INTRAMUSCULAR | Qty: 1 | Status: AC

## 2019-12-15 MED FILL — Epoetin Alfa-epbx Inj 2000 Unit/ML: INTRAMUSCULAR | Qty: 1 | Status: AC

## 2019-12-18 ENCOUNTER — Telehealth: Payer: Self-pay | Admitting: Cardiovascular Disease

## 2019-12-18 ENCOUNTER — Telehealth: Payer: Self-pay | Admitting: Family Medicine

## 2019-12-18 NOTE — Telephone Encounter (Signed)
No samples in the office at this time. The patient is aware.

## 2019-12-18 NOTE — Telephone Encounter (Signed)
Pt called to see if there were any samples of Eliquis available for him to pickup today Please follow up with pt.

## 2019-12-18 NOTE — Telephone Encounter (Signed)
Message Routed to PCP CMA 

## 2019-12-18 NOTE — Telephone Encounter (Signed)
Patient calling the office for samples of medication:   1.  What medication and dosage are you requesting samples for? apixaban (ELIQUIS) 2.5 MG TABS tablet   2.  Are you currently out of this medication? Yes    

## 2019-12-18 NOTE — Telephone Encounter (Signed)
Pt informed no samples available at this time.

## 2019-12-29 ENCOUNTER — Ambulatory Visit (HOSPITAL_COMMUNITY)
Admission: RE | Admit: 2019-12-29 | Discharge: 2019-12-29 | Disposition: A | Discharge status: Home / Self Care | Payer: Medicare Other | Source: Ambulatory Visit | Attending: Nephrology | Admitting: Nephrology

## 2019-12-29 ENCOUNTER — Other Ambulatory Visit: Payer: Self-pay

## 2019-12-29 VITALS — BP 137/83 | HR 76 | Temp 95.8°F | Resp 18

## 2019-12-29 DIAGNOSIS — Z9889 Other specified postprocedural states: Secondary | ICD-10-CM | POA: Diagnosis not present

## 2019-12-29 DIAGNOSIS — N184 Chronic kidney disease, stage 4 (severe): Secondary | ICD-10-CM | POA: Insufficient documentation

## 2019-12-29 DIAGNOSIS — N189 Chronic kidney disease, unspecified: Secondary | ICD-10-CM | POA: Diagnosis not present

## 2019-12-29 DIAGNOSIS — N185 Chronic kidney disease, stage 5: Secondary | ICD-10-CM | POA: Diagnosis not present

## 2019-12-29 DIAGNOSIS — N2581 Secondary hyperparathyroidism of renal origin: Secondary | ICD-10-CM | POA: Diagnosis not present

## 2019-12-29 DIAGNOSIS — Z8679 Personal history of other diseases of the circulatory system: Secondary | ICD-10-CM | POA: Diagnosis not present

## 2019-12-29 DIAGNOSIS — D631 Anemia in chronic kidney disease: Secondary | ICD-10-CM | POA: Diagnosis not present

## 2019-12-29 LAB — POCT HEMOGLOBIN-HEMACUE: Hemoglobin: 9.8 g/dL — ABNORMAL LOW (ref 13.0–17.0)

## 2019-12-29 MED ORDER — EPOETIN ALFA-EPBX 2000 UNIT/ML IJ SOLN
INTRAMUSCULAR | Status: AC
  Filled 2019-12-29: qty 1

## 2019-12-29 MED ORDER — EPOETIN ALFA-EPBX 3000 UNIT/ML IJ SOLN
INTRAMUSCULAR | Status: AC
  Filled 2019-12-29: qty 1

## 2019-12-29 MED ORDER — EPOETIN ALFA-EPBX 10000 UNIT/ML IJ SOLN
5000.0000 [IU] | INTRAMUSCULAR | Status: DC
  Administered 2019-12-29: 5000 [IU] via SUBCUTANEOUS

## 2020-01-05 DIAGNOSIS — D509 Iron deficiency anemia, unspecified: Secondary | ICD-10-CM | POA: Diagnosis not present

## 2020-01-05 DIAGNOSIS — D631 Anemia in chronic kidney disease: Secondary | ICD-10-CM | POA: Diagnosis not present

## 2020-01-05 DIAGNOSIS — D689 Coagulation defect, unspecified: Secondary | ICD-10-CM | POA: Diagnosis not present

## 2020-01-05 DIAGNOSIS — Z992 Dependence on renal dialysis: Secondary | ICD-10-CM | POA: Diagnosis not present

## 2020-01-05 DIAGNOSIS — N186 End stage renal disease: Secondary | ICD-10-CM | POA: Diagnosis not present

## 2020-01-05 DIAGNOSIS — T8249XD Other complication of vascular dialysis catheter, subsequent encounter: Secondary | ICD-10-CM | POA: Diagnosis not present

## 2020-01-05 DIAGNOSIS — N2581 Secondary hyperparathyroidism of renal origin: Secondary | ICD-10-CM | POA: Diagnosis not present

## 2020-01-09 DIAGNOSIS — D631 Anemia in chronic kidney disease: Secondary | ICD-10-CM | POA: Diagnosis not present

## 2020-01-09 DIAGNOSIS — N186 End stage renal disease: Secondary | ICD-10-CM | POA: Diagnosis not present

## 2020-01-09 DIAGNOSIS — T8249XD Other complication of vascular dialysis catheter, subsequent encounter: Secondary | ICD-10-CM | POA: Diagnosis not present

## 2020-01-09 DIAGNOSIS — N2581 Secondary hyperparathyroidism of renal origin: Secondary | ICD-10-CM | POA: Diagnosis not present

## 2020-01-09 DIAGNOSIS — D509 Iron deficiency anemia, unspecified: Secondary | ICD-10-CM | POA: Diagnosis not present

## 2020-01-09 DIAGNOSIS — Z992 Dependence on renal dialysis: Secondary | ICD-10-CM | POA: Diagnosis not present

## 2020-01-09 DIAGNOSIS — D689 Coagulation defect, unspecified: Secondary | ICD-10-CM | POA: Diagnosis not present

## 2020-01-12 ENCOUNTER — Encounter (HOSPITAL_COMMUNITY): Payer: Medicare Other

## 2020-01-13 DIAGNOSIS — T8249XD Other complication of vascular dialysis catheter, subsequent encounter: Secondary | ICD-10-CM | POA: Diagnosis not present

## 2020-01-13 DIAGNOSIS — D689 Coagulation defect, unspecified: Secondary | ICD-10-CM | POA: Diagnosis not present

## 2020-01-13 DIAGNOSIS — N2581 Secondary hyperparathyroidism of renal origin: Secondary | ICD-10-CM | POA: Diagnosis not present

## 2020-01-13 DIAGNOSIS — D631 Anemia in chronic kidney disease: Secondary | ICD-10-CM | POA: Diagnosis not present

## 2020-01-13 DIAGNOSIS — Z992 Dependence on renal dialysis: Secondary | ICD-10-CM | POA: Diagnosis not present

## 2020-01-13 DIAGNOSIS — N186 End stage renal disease: Secondary | ICD-10-CM | POA: Diagnosis not present

## 2020-01-13 DIAGNOSIS — D509 Iron deficiency anemia, unspecified: Secondary | ICD-10-CM | POA: Diagnosis not present

## 2020-01-14 DIAGNOSIS — D631 Anemia in chronic kidney disease: Secondary | ICD-10-CM | POA: Diagnosis not present

## 2020-01-14 DIAGNOSIS — D509 Iron deficiency anemia, unspecified: Secondary | ICD-10-CM | POA: Diagnosis not present

## 2020-01-14 DIAGNOSIS — D689 Coagulation defect, unspecified: Secondary | ICD-10-CM | POA: Diagnosis not present

## 2020-01-14 DIAGNOSIS — Z992 Dependence on renal dialysis: Secondary | ICD-10-CM | POA: Diagnosis not present

## 2020-01-14 DIAGNOSIS — N186 End stage renal disease: Secondary | ICD-10-CM | POA: Diagnosis not present

## 2020-01-14 DIAGNOSIS — N2581 Secondary hyperparathyroidism of renal origin: Secondary | ICD-10-CM | POA: Diagnosis not present

## 2020-01-14 DIAGNOSIS — T8249XD Other complication of vascular dialysis catheter, subsequent encounter: Secondary | ICD-10-CM | POA: Diagnosis not present

## 2020-01-16 ENCOUNTER — Encounter: Payer: Self-pay | Admitting: Family Medicine

## 2020-01-16 DIAGNOSIS — T8249XD Other complication of vascular dialysis catheter, subsequent encounter: Secondary | ICD-10-CM | POA: Diagnosis not present

## 2020-01-16 DIAGNOSIS — D689 Coagulation defect, unspecified: Secondary | ICD-10-CM | POA: Diagnosis not present

## 2020-01-16 DIAGNOSIS — Z992 Dependence on renal dialysis: Secondary | ICD-10-CM | POA: Diagnosis not present

## 2020-01-16 DIAGNOSIS — D631 Anemia in chronic kidney disease: Secondary | ICD-10-CM | POA: Diagnosis not present

## 2020-01-16 DIAGNOSIS — D509 Iron deficiency anemia, unspecified: Secondary | ICD-10-CM | POA: Diagnosis not present

## 2020-01-16 DIAGNOSIS — N2581 Secondary hyperparathyroidism of renal origin: Secondary | ICD-10-CM | POA: Diagnosis not present

## 2020-01-16 DIAGNOSIS — N186 End stage renal disease: Secondary | ICD-10-CM | POA: Diagnosis not present

## 2020-01-19 DIAGNOSIS — T8249XD Other complication of vascular dialysis catheter, subsequent encounter: Secondary | ICD-10-CM | POA: Diagnosis not present

## 2020-01-19 DIAGNOSIS — N186 End stage renal disease: Secondary | ICD-10-CM | POA: Diagnosis not present

## 2020-01-19 DIAGNOSIS — Z992 Dependence on renal dialysis: Secondary | ICD-10-CM | POA: Diagnosis not present

## 2020-01-19 DIAGNOSIS — D689 Coagulation defect, unspecified: Secondary | ICD-10-CM | POA: Diagnosis not present

## 2020-01-19 DIAGNOSIS — D631 Anemia in chronic kidney disease: Secondary | ICD-10-CM | POA: Diagnosis not present

## 2020-01-19 DIAGNOSIS — D509 Iron deficiency anemia, unspecified: Secondary | ICD-10-CM | POA: Diagnosis not present

## 2020-01-19 DIAGNOSIS — N2581 Secondary hyperparathyroidism of renal origin: Secondary | ICD-10-CM | POA: Diagnosis not present

## 2020-01-19 NOTE — Telephone Encounter (Signed)
Set up a virtual OV so we can talk about this

## 2020-01-21 DIAGNOSIS — N186 End stage renal disease: Secondary | ICD-10-CM | POA: Diagnosis not present

## 2020-01-21 DIAGNOSIS — Z992 Dependence on renal dialysis: Secondary | ICD-10-CM | POA: Diagnosis not present

## 2020-01-21 DIAGNOSIS — D631 Anemia in chronic kidney disease: Secondary | ICD-10-CM | POA: Diagnosis not present

## 2020-01-21 DIAGNOSIS — D689 Coagulation defect, unspecified: Secondary | ICD-10-CM | POA: Diagnosis not present

## 2020-01-21 DIAGNOSIS — N2581 Secondary hyperparathyroidism of renal origin: Secondary | ICD-10-CM | POA: Diagnosis not present

## 2020-01-21 DIAGNOSIS — D509 Iron deficiency anemia, unspecified: Secondary | ICD-10-CM | POA: Diagnosis not present

## 2020-01-21 DIAGNOSIS — T8249XD Other complication of vascular dialysis catheter, subsequent encounter: Secondary | ICD-10-CM | POA: Diagnosis not present

## 2020-01-22 ENCOUNTER — Telehealth (INDEPENDENT_AMBULATORY_CARE_PROVIDER_SITE_OTHER): Payer: Medicare Other | Admitting: Family Medicine

## 2020-01-22 ENCOUNTER — Other Ambulatory Visit: Payer: Self-pay

## 2020-01-22 DIAGNOSIS — F411 Generalized anxiety disorder: Secondary | ICD-10-CM

## 2020-01-22 DIAGNOSIS — G4700 Insomnia, unspecified: Secondary | ICD-10-CM | POA: Diagnosis not present

## 2020-01-22 DIAGNOSIS — N184 Chronic kidney disease, stage 4 (severe): Secondary | ICD-10-CM

## 2020-01-22 MED ORDER — FLUOXETINE HCL 20 MG PO TABS: 20.0000 mg | ORAL_TABLET | Freq: Every day | ORAL | 3 refills | Status: DC

## 2020-01-22 MED ORDER — TEMAZEPAM 30 MG PO CAPS: 30.0000 mg | ORAL_CAPSULE | Freq: Every evening | ORAL | 3 refills | Status: DC | PRN

## 2020-01-22 NOTE — Progress Notes (Signed)
Virtual Visit via Telephone Note  I connected with the patient on 01/22/20 at 10:45 AM EST by telephone and verified that I am speaking with the correct person using two identifiers.   I discussed the limitations, risks, security and privacy concerns of performing an evaluation and management service by telephone and the availability of in person appointments. I also discussed with the patient that there may be a patient responsible charge related to this service. The patient expressed understanding and agreed to proceed.  Location patient: home Location provider: work or home office Participants present for the call: patient, provider Patient did not have a visit in the prior 7 days to address this/these issue(s).   History of Present Illness: Here asking for help with anxiety and with sleep. He has a number of medical issues, including renal failure. He started on dialysis 2 weeks ago and he goes 3 days a week. This has been difficult for him, and he feels extremely anxious during these 4 hour sessions. He has trouble sitting still and wants "to explode" on the inside. He feels anxious most of the other times of day as well. No depression symptoms. Also he has trouble sleeping at night. Part of this is due to anxiety, but part is due to pain. His AV fistula failed, so they had to insert a subclavian catheter for access. This prevents him from sleeping on his stomach, which is his preferred position.    Observations/Objective: Patient sounds cheerful and well on the phone. I do not appreciate any SOB. Speech and thought processing are grossly intact. Patient reported vitals:  Assessment and Plan: For the anxiety, try Prozac 20 mg each morning. For the insomnia, try Temazepam 30 mg at bedtime. Recheck in 3-4 weeks.  Alysia Penna, MD   Follow Up Instructions:     [phone] [phone] 21-30 I did not refer this patient for an OV in the next 24 hours for this/these issue(s).  I  discussed the assessment and treatment plan with the patient. The patient was provided an opportunity to ask questions and all were answered. The patient agreed with the plan and demonstrated an understanding of the instructions.   The patient was advised to call back or seek an in-person evaluation if the symptoms worsen or if the condition fails to improve as anticipated.  I provided 26 minutes of non-face-to-face time during this encounter.   Alysia Penna, MD

## 2020-01-23 DIAGNOSIS — N186 End stage renal disease: Secondary | ICD-10-CM | POA: Diagnosis not present

## 2020-01-23 DIAGNOSIS — Z992 Dependence on renal dialysis: Secondary | ICD-10-CM | POA: Diagnosis not present

## 2020-01-23 DIAGNOSIS — D631 Anemia in chronic kidney disease: Secondary | ICD-10-CM | POA: Diagnosis not present

## 2020-01-23 DIAGNOSIS — T8249XD Other complication of vascular dialysis catheter, subsequent encounter: Secondary | ICD-10-CM | POA: Diagnosis not present

## 2020-01-23 DIAGNOSIS — N2581 Secondary hyperparathyroidism of renal origin: Secondary | ICD-10-CM | POA: Diagnosis not present

## 2020-01-23 DIAGNOSIS — D689 Coagulation defect, unspecified: Secondary | ICD-10-CM | POA: Diagnosis not present

## 2020-01-23 DIAGNOSIS — D509 Iron deficiency anemia, unspecified: Secondary | ICD-10-CM | POA: Diagnosis not present

## 2020-01-25 DIAGNOSIS — I129 Hypertensive chronic kidney disease with stage 1 through stage 4 chronic kidney disease, or unspecified chronic kidney disease: Secondary | ICD-10-CM | POA: Diagnosis not present

## 2020-01-25 DIAGNOSIS — N186 End stage renal disease: Secondary | ICD-10-CM | POA: Diagnosis not present

## 2020-01-25 DIAGNOSIS — Z992 Dependence on renal dialysis: Secondary | ICD-10-CM | POA: Diagnosis not present

## 2020-01-26 DIAGNOSIS — D689 Coagulation defect, unspecified: Secondary | ICD-10-CM | POA: Diagnosis not present

## 2020-01-26 DIAGNOSIS — Z23 Encounter for immunization: Secondary | ICD-10-CM | POA: Diagnosis not present

## 2020-01-26 DIAGNOSIS — N2581 Secondary hyperparathyroidism of renal origin: Secondary | ICD-10-CM | POA: Diagnosis not present

## 2020-01-26 DIAGNOSIS — D631 Anemia in chronic kidney disease: Secondary | ICD-10-CM | POA: Diagnosis not present

## 2020-01-26 DIAGNOSIS — Z283 Underimmunization status: Secondary | ICD-10-CM | POA: Diagnosis not present

## 2020-01-26 DIAGNOSIS — Z992 Dependence on renal dialysis: Secondary | ICD-10-CM | POA: Diagnosis not present

## 2020-01-26 DIAGNOSIS — D509 Iron deficiency anemia, unspecified: Secondary | ICD-10-CM | POA: Diagnosis not present

## 2020-01-26 DIAGNOSIS — N186 End stage renal disease: Secondary | ICD-10-CM | POA: Diagnosis not present

## 2020-01-26 DIAGNOSIS — T8249XD Other complication of vascular dialysis catheter, subsequent encounter: Secondary | ICD-10-CM | POA: Diagnosis not present

## 2020-01-27 ENCOUNTER — Telehealth: Payer: Self-pay | Admitting: Family Medicine

## 2020-01-27 NOTE — Telephone Encounter (Signed)
This does not make sense. First I did not order brand name Prozac, it was generic fluoxetine. Second I have never heard of an insurance company that will not cover this

## 2020-01-27 NOTE — Telephone Encounter (Signed)
Insurance doesn't accept the Prozac brand. Pt has not contacted insurance in order to find out what they accept. Pt wants Sarajane Jews to call in a different brand instead.  Pharmacy: [facility] can be reached at [phone]

## 2020-01-28 DIAGNOSIS — D631 Anemia in chronic kidney disease: Secondary | ICD-10-CM | POA: Diagnosis not present

## 2020-01-28 DIAGNOSIS — Z23 Encounter for immunization: Secondary | ICD-10-CM | POA: Diagnosis not present

## 2020-01-28 DIAGNOSIS — T8249XD Other complication of vascular dialysis catheter, subsequent encounter: Secondary | ICD-10-CM | POA: Diagnosis not present

## 2020-01-28 DIAGNOSIS — N2581 Secondary hyperparathyroidism of renal origin: Secondary | ICD-10-CM | POA: Diagnosis not present

## 2020-01-28 DIAGNOSIS — Z283 Underimmunization status: Secondary | ICD-10-CM | POA: Diagnosis not present

## 2020-01-28 DIAGNOSIS — D509 Iron deficiency anemia, unspecified: Secondary | ICD-10-CM | POA: Diagnosis not present

## 2020-01-28 DIAGNOSIS — N186 End stage renal disease: Secondary | ICD-10-CM | POA: Diagnosis not present

## 2020-01-28 DIAGNOSIS — Z992 Dependence on renal dialysis: Secondary | ICD-10-CM | POA: Diagnosis not present

## 2020-01-28 DIAGNOSIS — D689 Coagulation defect, unspecified: Secondary | ICD-10-CM | POA: Diagnosis not present

## 2020-01-28 MED ORDER — SERTRALINE HCL 50 MG PO TABS: 50.0000 mg | ORAL_TABLET | Freq: Every day | ORAL | 2 refills | Status: DC

## 2020-01-28 NOTE — Telephone Encounter (Signed)
I understand. Cancel the Prozac. Instead call in Zoloft 50 mg daily, #30 with 2 rf

## 2020-01-28 NOTE — Telephone Encounter (Signed)
Spoke with the patients pharmacy and [employer] will cover, celexa, lexapro or zoloft.

## 2020-01-28 NOTE — Telephone Encounter (Signed)
Rx has been canceled and new rx called in. Left a detailed message on verified voice mail.

## 2020-01-30 ENCOUNTER — Other Ambulatory Visit: Payer: Self-pay | Admitting: Cardiovascular Disease

## 2020-01-30 DIAGNOSIS — N186 End stage renal disease: Secondary | ICD-10-CM | POA: Diagnosis not present

## 2020-01-30 DIAGNOSIS — Z23 Encounter for immunization: Secondary | ICD-10-CM | POA: Diagnosis not present

## 2020-01-30 DIAGNOSIS — Z283 Underimmunization status: Secondary | ICD-10-CM | POA: Diagnosis not present

## 2020-01-30 DIAGNOSIS — T8249XD Other complication of vascular dialysis catheter, subsequent encounter: Secondary | ICD-10-CM | POA: Diagnosis not present

## 2020-01-30 DIAGNOSIS — D689 Coagulation defect, unspecified: Secondary | ICD-10-CM | POA: Diagnosis not present

## 2020-01-30 DIAGNOSIS — Z992 Dependence on renal dialysis: Secondary | ICD-10-CM | POA: Diagnosis not present

## 2020-01-30 DIAGNOSIS — D509 Iron deficiency anemia, unspecified: Secondary | ICD-10-CM | POA: Diagnosis not present

## 2020-01-30 DIAGNOSIS — D631 Anemia in chronic kidney disease: Secondary | ICD-10-CM | POA: Diagnosis not present

## 2020-01-30 DIAGNOSIS — N2581 Secondary hyperparathyroidism of renal origin: Secondary | ICD-10-CM | POA: Diagnosis not present

## 2020-02-02 DIAGNOSIS — T8249XD Other complication of vascular dialysis catheter, subsequent encounter: Secondary | ICD-10-CM | POA: Diagnosis not present

## 2020-02-02 DIAGNOSIS — Z23 Encounter for immunization: Secondary | ICD-10-CM | POA: Diagnosis not present

## 2020-02-02 DIAGNOSIS — Z283 Underimmunization status: Secondary | ICD-10-CM | POA: Diagnosis not present

## 2020-02-02 DIAGNOSIS — Z992 Dependence on renal dialysis: Secondary | ICD-10-CM | POA: Diagnosis not present

## 2020-02-02 DIAGNOSIS — D509 Iron deficiency anemia, unspecified: Secondary | ICD-10-CM | POA: Diagnosis not present

## 2020-02-02 DIAGNOSIS — N186 End stage renal disease: Secondary | ICD-10-CM | POA: Diagnosis not present

## 2020-02-02 DIAGNOSIS — D689 Coagulation defect, unspecified: Secondary | ICD-10-CM | POA: Diagnosis not present

## 2020-02-02 DIAGNOSIS — N2581 Secondary hyperparathyroidism of renal origin: Secondary | ICD-10-CM | POA: Diagnosis not present

## 2020-02-02 DIAGNOSIS — D631 Anemia in chronic kidney disease: Secondary | ICD-10-CM | POA: Diagnosis not present

## 2020-02-04 DIAGNOSIS — D631 Anemia in chronic kidney disease: Secondary | ICD-10-CM | POA: Diagnosis not present

## 2020-02-04 DIAGNOSIS — D509 Iron deficiency anemia, unspecified: Secondary | ICD-10-CM | POA: Diagnosis not present

## 2020-02-04 DIAGNOSIS — Z992 Dependence on renal dialysis: Secondary | ICD-10-CM | POA: Diagnosis not present

## 2020-02-04 DIAGNOSIS — Z283 Underimmunization status: Secondary | ICD-10-CM | POA: Diagnosis not present

## 2020-02-04 DIAGNOSIS — D689 Coagulation defect, unspecified: Secondary | ICD-10-CM | POA: Diagnosis not present

## 2020-02-04 DIAGNOSIS — N186 End stage renal disease: Secondary | ICD-10-CM | POA: Diagnosis not present

## 2020-02-04 DIAGNOSIS — Z23 Encounter for immunization: Secondary | ICD-10-CM | POA: Diagnosis not present

## 2020-02-04 DIAGNOSIS — N2581 Secondary hyperparathyroidism of renal origin: Secondary | ICD-10-CM | POA: Diagnosis not present

## 2020-02-04 DIAGNOSIS — T8249XD Other complication of vascular dialysis catheter, subsequent encounter: Secondary | ICD-10-CM | POA: Diagnosis not present

## 2020-02-06 DIAGNOSIS — Z23 Encounter for immunization: Secondary | ICD-10-CM | POA: Diagnosis not present

## 2020-02-06 DIAGNOSIS — D689 Coagulation defect, unspecified: Secondary | ICD-10-CM | POA: Diagnosis not present

## 2020-02-06 DIAGNOSIS — D509 Iron deficiency anemia, unspecified: Secondary | ICD-10-CM | POA: Diagnosis not present

## 2020-02-06 DIAGNOSIS — N2581 Secondary hyperparathyroidism of renal origin: Secondary | ICD-10-CM | POA: Diagnosis not present

## 2020-02-06 DIAGNOSIS — Z283 Underimmunization status: Secondary | ICD-10-CM | POA: Diagnosis not present

## 2020-02-06 DIAGNOSIS — N186 End stage renal disease: Secondary | ICD-10-CM | POA: Diagnosis not present

## 2020-02-06 DIAGNOSIS — Z992 Dependence on renal dialysis: Secondary | ICD-10-CM | POA: Diagnosis not present

## 2020-02-06 DIAGNOSIS — T8249XD Other complication of vascular dialysis catheter, subsequent encounter: Secondary | ICD-10-CM | POA: Diagnosis not present

## 2020-02-06 DIAGNOSIS — D631 Anemia in chronic kidney disease: Secondary | ICD-10-CM | POA: Diagnosis not present

## 2020-02-09 DIAGNOSIS — Z283 Underimmunization status: Secondary | ICD-10-CM | POA: Diagnosis not present

## 2020-02-09 DIAGNOSIS — N2581 Secondary hyperparathyroidism of renal origin: Secondary | ICD-10-CM | POA: Diagnosis not present

## 2020-02-09 DIAGNOSIS — Z23 Encounter for immunization: Secondary | ICD-10-CM | POA: Diagnosis not present

## 2020-02-09 DIAGNOSIS — N186 End stage renal disease: Secondary | ICD-10-CM | POA: Diagnosis not present

## 2020-02-09 DIAGNOSIS — Z992 Dependence on renal dialysis: Secondary | ICD-10-CM | POA: Diagnosis not present

## 2020-02-09 DIAGNOSIS — D509 Iron deficiency anemia, unspecified: Secondary | ICD-10-CM | POA: Diagnosis not present

## 2020-02-09 DIAGNOSIS — D689 Coagulation defect, unspecified: Secondary | ICD-10-CM | POA: Diagnosis not present

## 2020-02-09 DIAGNOSIS — T8249XD Other complication of vascular dialysis catheter, subsequent encounter: Secondary | ICD-10-CM | POA: Diagnosis not present

## 2020-02-09 DIAGNOSIS — D631 Anemia in chronic kidney disease: Secondary | ICD-10-CM | POA: Diagnosis not present

## 2020-02-11 DIAGNOSIS — Z283 Underimmunization status: Secondary | ICD-10-CM | POA: Diagnosis not present

## 2020-02-11 DIAGNOSIS — D509 Iron deficiency anemia, unspecified: Secondary | ICD-10-CM | POA: Diagnosis not present

## 2020-02-11 DIAGNOSIS — N2581 Secondary hyperparathyroidism of renal origin: Secondary | ICD-10-CM | POA: Diagnosis not present

## 2020-02-11 DIAGNOSIS — Z23 Encounter for immunization: Secondary | ICD-10-CM | POA: Diagnosis not present

## 2020-02-11 DIAGNOSIS — T8249XD Other complication of vascular dialysis catheter, subsequent encounter: Secondary | ICD-10-CM | POA: Diagnosis not present

## 2020-02-11 DIAGNOSIS — D689 Coagulation defect, unspecified: Secondary | ICD-10-CM | POA: Diagnosis not present

## 2020-02-11 DIAGNOSIS — D631 Anemia in chronic kidney disease: Secondary | ICD-10-CM | POA: Diagnosis not present

## 2020-02-11 DIAGNOSIS — N186 End stage renal disease: Secondary | ICD-10-CM | POA: Diagnosis not present

## 2020-02-11 DIAGNOSIS — Z992 Dependence on renal dialysis: Secondary | ICD-10-CM | POA: Diagnosis not present

## 2020-02-13 DIAGNOSIS — D509 Iron deficiency anemia, unspecified: Secondary | ICD-10-CM | POA: Diagnosis not present

## 2020-02-13 DIAGNOSIS — D689 Coagulation defect, unspecified: Secondary | ICD-10-CM | POA: Diagnosis not present

## 2020-02-13 DIAGNOSIS — N2581 Secondary hyperparathyroidism of renal origin: Secondary | ICD-10-CM | POA: Diagnosis not present

## 2020-02-13 DIAGNOSIS — Z283 Underimmunization status: Secondary | ICD-10-CM | POA: Diagnosis not present

## 2020-02-13 DIAGNOSIS — Z992 Dependence on renal dialysis: Secondary | ICD-10-CM | POA: Diagnosis not present

## 2020-02-13 DIAGNOSIS — T8249XD Other complication of vascular dialysis catheter, subsequent encounter: Secondary | ICD-10-CM | POA: Diagnosis not present

## 2020-02-13 DIAGNOSIS — Z23 Encounter for immunization: Secondary | ICD-10-CM | POA: Diagnosis not present

## 2020-02-13 DIAGNOSIS — N186 End stage renal disease: Secondary | ICD-10-CM | POA: Diagnosis not present

## 2020-02-13 DIAGNOSIS — D631 Anemia in chronic kidney disease: Secondary | ICD-10-CM | POA: Diagnosis not present

## 2020-02-16 DIAGNOSIS — D509 Iron deficiency anemia, unspecified: Secondary | ICD-10-CM | POA: Diagnosis not present

## 2020-02-16 DIAGNOSIS — Z992 Dependence on renal dialysis: Secondary | ICD-10-CM | POA: Diagnosis not present

## 2020-02-16 DIAGNOSIS — Z23 Encounter for immunization: Secondary | ICD-10-CM | POA: Diagnosis not present

## 2020-02-16 DIAGNOSIS — N186 End stage renal disease: Secondary | ICD-10-CM | POA: Diagnosis not present

## 2020-02-16 DIAGNOSIS — Z283 Underimmunization status: Secondary | ICD-10-CM | POA: Diagnosis not present

## 2020-02-16 DIAGNOSIS — N2581 Secondary hyperparathyroidism of renal origin: Secondary | ICD-10-CM | POA: Diagnosis not present

## 2020-02-16 DIAGNOSIS — D631 Anemia in chronic kidney disease: Secondary | ICD-10-CM | POA: Diagnosis not present

## 2020-02-16 DIAGNOSIS — D689 Coagulation defect, unspecified: Secondary | ICD-10-CM | POA: Diagnosis not present

## 2020-02-16 DIAGNOSIS — T8249XD Other complication of vascular dialysis catheter, subsequent encounter: Secondary | ICD-10-CM | POA: Diagnosis not present

## 2020-02-18 DIAGNOSIS — T8249XD Other complication of vascular dialysis catheter, subsequent encounter: Secondary | ICD-10-CM | POA: Diagnosis not present

## 2020-02-18 DIAGNOSIS — D689 Coagulation defect, unspecified: Secondary | ICD-10-CM | POA: Diagnosis not present

## 2020-02-18 DIAGNOSIS — N186 End stage renal disease: Secondary | ICD-10-CM | POA: Diagnosis not present

## 2020-02-18 DIAGNOSIS — D631 Anemia in chronic kidney disease: Secondary | ICD-10-CM | POA: Diagnosis not present

## 2020-02-18 DIAGNOSIS — D509 Iron deficiency anemia, unspecified: Secondary | ICD-10-CM | POA: Diagnosis not present

## 2020-02-18 DIAGNOSIS — N2581 Secondary hyperparathyroidism of renal origin: Secondary | ICD-10-CM | POA: Diagnosis not present

## 2020-02-18 DIAGNOSIS — Z23 Encounter for immunization: Secondary | ICD-10-CM | POA: Diagnosis not present

## 2020-02-18 DIAGNOSIS — Z283 Underimmunization status: Secondary | ICD-10-CM | POA: Diagnosis not present

## 2020-02-18 DIAGNOSIS — Z992 Dependence on renal dialysis: Secondary | ICD-10-CM | POA: Diagnosis not present

## 2020-02-20 DIAGNOSIS — Z283 Underimmunization status: Secondary | ICD-10-CM | POA: Diagnosis not present

## 2020-02-20 DIAGNOSIS — T8249XD Other complication of vascular dialysis catheter, subsequent encounter: Secondary | ICD-10-CM | POA: Diagnosis not present

## 2020-02-20 DIAGNOSIS — N186 End stage renal disease: Secondary | ICD-10-CM | POA: Diagnosis not present

## 2020-02-20 DIAGNOSIS — N2581 Secondary hyperparathyroidism of renal origin: Secondary | ICD-10-CM | POA: Diagnosis not present

## 2020-02-20 DIAGNOSIS — D509 Iron deficiency anemia, unspecified: Secondary | ICD-10-CM | POA: Diagnosis not present

## 2020-02-20 DIAGNOSIS — Z23 Encounter for immunization: Secondary | ICD-10-CM | POA: Diagnosis not present

## 2020-02-20 DIAGNOSIS — D689 Coagulation defect, unspecified: Secondary | ICD-10-CM | POA: Diagnosis not present

## 2020-02-20 DIAGNOSIS — D631 Anemia in chronic kidney disease: Secondary | ICD-10-CM | POA: Diagnosis not present

## 2020-02-20 DIAGNOSIS — Z992 Dependence on renal dialysis: Secondary | ICD-10-CM | POA: Diagnosis not present

## 2020-02-22 DIAGNOSIS — N186 End stage renal disease: Secondary | ICD-10-CM | POA: Diagnosis not present

## 2020-02-22 DIAGNOSIS — Z992 Dependence on renal dialysis: Secondary | ICD-10-CM | POA: Diagnosis not present

## 2020-02-22 DIAGNOSIS — I129 Hypertensive chronic kidney disease with stage 1 through stage 4 chronic kidney disease, or unspecified chronic kidney disease: Secondary | ICD-10-CM | POA: Diagnosis not present

## 2020-02-23 DIAGNOSIS — D631 Anemia in chronic kidney disease: Secondary | ICD-10-CM | POA: Diagnosis not present

## 2020-02-23 DIAGNOSIS — Z992 Dependence on renal dialysis: Secondary | ICD-10-CM | POA: Diagnosis not present

## 2020-02-23 DIAGNOSIS — N186 End stage renal disease: Secondary | ICD-10-CM | POA: Diagnosis not present

## 2020-02-23 DIAGNOSIS — Z23 Encounter for immunization: Secondary | ICD-10-CM | POA: Diagnosis not present

## 2020-02-23 DIAGNOSIS — D509 Iron deficiency anemia, unspecified: Secondary | ICD-10-CM | POA: Diagnosis not present

## 2020-02-23 DIAGNOSIS — T8249XD Other complication of vascular dialysis catheter, subsequent encounter: Secondary | ICD-10-CM | POA: Diagnosis not present

## 2020-02-23 DIAGNOSIS — N2581 Secondary hyperparathyroidism of renal origin: Secondary | ICD-10-CM | POA: Diagnosis not present

## 2020-02-23 DIAGNOSIS — D689 Coagulation defect, unspecified: Secondary | ICD-10-CM | POA: Diagnosis not present

## 2020-02-23 DIAGNOSIS — Z283 Underimmunization status: Secondary | ICD-10-CM | POA: Diagnosis not present

## 2020-02-25 DIAGNOSIS — Z992 Dependence on renal dialysis: Secondary | ICD-10-CM | POA: Diagnosis not present

## 2020-02-25 DIAGNOSIS — Z283 Underimmunization status: Secondary | ICD-10-CM | POA: Diagnosis not present

## 2020-02-25 DIAGNOSIS — N186 End stage renal disease: Secondary | ICD-10-CM | POA: Diagnosis not present

## 2020-02-25 DIAGNOSIS — D631 Anemia in chronic kidney disease: Secondary | ICD-10-CM | POA: Diagnosis not present

## 2020-02-25 DIAGNOSIS — D509 Iron deficiency anemia, unspecified: Secondary | ICD-10-CM | POA: Diagnosis not present

## 2020-02-25 DIAGNOSIS — D689 Coagulation defect, unspecified: Secondary | ICD-10-CM | POA: Diagnosis not present

## 2020-02-25 DIAGNOSIS — T8249XD Other complication of vascular dialysis catheter, subsequent encounter: Secondary | ICD-10-CM | POA: Diagnosis not present

## 2020-02-25 DIAGNOSIS — N2581 Secondary hyperparathyroidism of renal origin: Secondary | ICD-10-CM | POA: Diagnosis not present

## 2020-02-25 DIAGNOSIS — Z23 Encounter for immunization: Secondary | ICD-10-CM | POA: Diagnosis not present

## 2020-02-27 DIAGNOSIS — T8249XD Other complication of vascular dialysis catheter, subsequent encounter: Secondary | ICD-10-CM | POA: Diagnosis not present

## 2020-02-27 DIAGNOSIS — Z283 Underimmunization status: Secondary | ICD-10-CM | POA: Diagnosis not present

## 2020-02-27 DIAGNOSIS — Z992 Dependence on renal dialysis: Secondary | ICD-10-CM | POA: Diagnosis not present

## 2020-02-27 DIAGNOSIS — D509 Iron deficiency anemia, unspecified: Secondary | ICD-10-CM | POA: Diagnosis not present

## 2020-02-27 DIAGNOSIS — N2581 Secondary hyperparathyroidism of renal origin: Secondary | ICD-10-CM | POA: Diagnosis not present

## 2020-02-27 DIAGNOSIS — N186 End stage renal disease: Secondary | ICD-10-CM | POA: Diagnosis not present

## 2020-02-27 DIAGNOSIS — D689 Coagulation defect, unspecified: Secondary | ICD-10-CM | POA: Diagnosis not present

## 2020-02-27 DIAGNOSIS — Z23 Encounter for immunization: Secondary | ICD-10-CM | POA: Diagnosis not present

## 2020-02-27 DIAGNOSIS — D631 Anemia in chronic kidney disease: Secondary | ICD-10-CM | POA: Diagnosis not present

## 2020-03-01 DIAGNOSIS — N2581 Secondary hyperparathyroidism of renal origin: Secondary | ICD-10-CM | POA: Diagnosis not present

## 2020-03-01 DIAGNOSIS — T8249XD Other complication of vascular dialysis catheter, subsequent encounter: Secondary | ICD-10-CM | POA: Diagnosis not present

## 2020-03-01 DIAGNOSIS — D631 Anemia in chronic kidney disease: Secondary | ICD-10-CM | POA: Diagnosis not present

## 2020-03-01 DIAGNOSIS — Z992 Dependence on renal dialysis: Secondary | ICD-10-CM | POA: Diagnosis not present

## 2020-03-01 DIAGNOSIS — N186 End stage renal disease: Secondary | ICD-10-CM | POA: Diagnosis not present

## 2020-03-01 DIAGNOSIS — Z23 Encounter for immunization: Secondary | ICD-10-CM | POA: Diagnosis not present

## 2020-03-01 DIAGNOSIS — D689 Coagulation defect, unspecified: Secondary | ICD-10-CM | POA: Diagnosis not present

## 2020-03-01 DIAGNOSIS — D509 Iron deficiency anemia, unspecified: Secondary | ICD-10-CM | POA: Diagnosis not present

## 2020-03-01 DIAGNOSIS — Z283 Underimmunization status: Secondary | ICD-10-CM | POA: Diagnosis not present

## 2020-03-03 DIAGNOSIS — D689 Coagulation defect, unspecified: Secondary | ICD-10-CM | POA: Diagnosis not present

## 2020-03-03 DIAGNOSIS — T8249XD Other complication of vascular dialysis catheter, subsequent encounter: Secondary | ICD-10-CM | POA: Diagnosis not present

## 2020-03-03 DIAGNOSIS — Z23 Encounter for immunization: Secondary | ICD-10-CM | POA: Diagnosis not present

## 2020-03-03 DIAGNOSIS — D509 Iron deficiency anemia, unspecified: Secondary | ICD-10-CM | POA: Diagnosis not present

## 2020-03-03 DIAGNOSIS — N2581 Secondary hyperparathyroidism of renal origin: Secondary | ICD-10-CM | POA: Diagnosis not present

## 2020-03-03 DIAGNOSIS — Z992 Dependence on renal dialysis: Secondary | ICD-10-CM | POA: Diagnosis not present

## 2020-03-03 DIAGNOSIS — D631 Anemia in chronic kidney disease: Secondary | ICD-10-CM | POA: Diagnosis not present

## 2020-03-03 DIAGNOSIS — N186 End stage renal disease: Secondary | ICD-10-CM | POA: Diagnosis not present

## 2020-03-03 DIAGNOSIS — Z283 Underimmunization status: Secondary | ICD-10-CM | POA: Diagnosis not present

## 2020-03-05 DIAGNOSIS — N186 End stage renal disease: Secondary | ICD-10-CM | POA: Diagnosis not present

## 2020-03-05 DIAGNOSIS — Z992 Dependence on renal dialysis: Secondary | ICD-10-CM | POA: Diagnosis not present

## 2020-03-05 DIAGNOSIS — D509 Iron deficiency anemia, unspecified: Secondary | ICD-10-CM | POA: Diagnosis not present

## 2020-03-05 DIAGNOSIS — Z23 Encounter for immunization: Secondary | ICD-10-CM | POA: Diagnosis not present

## 2020-03-05 DIAGNOSIS — N2581 Secondary hyperparathyroidism of renal origin: Secondary | ICD-10-CM | POA: Diagnosis not present

## 2020-03-05 DIAGNOSIS — T8249XD Other complication of vascular dialysis catheter, subsequent encounter: Secondary | ICD-10-CM | POA: Diagnosis not present

## 2020-03-05 DIAGNOSIS — Z283 Underimmunization status: Secondary | ICD-10-CM | POA: Diagnosis not present

## 2020-03-05 DIAGNOSIS — D689 Coagulation defect, unspecified: Secondary | ICD-10-CM | POA: Diagnosis not present

## 2020-03-05 DIAGNOSIS — D631 Anemia in chronic kidney disease: Secondary | ICD-10-CM | POA: Diagnosis not present

## 2020-03-08 ENCOUNTER — Other Ambulatory Visit: Payer: Self-pay | Admitting: *Deleted

## 2020-03-08 DIAGNOSIS — Z283 Underimmunization status: Secondary | ICD-10-CM | POA: Diagnosis not present

## 2020-03-08 DIAGNOSIS — N186 End stage renal disease: Secondary | ICD-10-CM | POA: Diagnosis not present

## 2020-03-08 DIAGNOSIS — D509 Iron deficiency anemia, unspecified: Secondary | ICD-10-CM | POA: Diagnosis not present

## 2020-03-08 DIAGNOSIS — D689 Coagulation defect, unspecified: Secondary | ICD-10-CM | POA: Diagnosis not present

## 2020-03-08 DIAGNOSIS — D631 Anemia in chronic kidney disease: Secondary | ICD-10-CM | POA: Diagnosis not present

## 2020-03-08 DIAGNOSIS — Z992 Dependence on renal dialysis: Secondary | ICD-10-CM | POA: Diagnosis not present

## 2020-03-08 DIAGNOSIS — N2581 Secondary hyperparathyroidism of renal origin: Secondary | ICD-10-CM | POA: Diagnosis not present

## 2020-03-08 DIAGNOSIS — Z23 Encounter for immunization: Secondary | ICD-10-CM | POA: Diagnosis not present

## 2020-03-08 DIAGNOSIS — T8249XD Other complication of vascular dialysis catheter, subsequent encounter: Secondary | ICD-10-CM | POA: Diagnosis not present

## 2020-03-08 DIAGNOSIS — N184 Chronic kidney disease, stage 4 (severe): Secondary | ICD-10-CM

## 2020-03-09 ENCOUNTER — Encounter: Payer: Self-pay | Admitting: Vascular Surgery

## 2020-03-09 ENCOUNTER — Other Ambulatory Visit: Payer: Self-pay

## 2020-03-09 ENCOUNTER — Encounter (HOSPITAL_COMMUNITY): Payer: Self-pay | Admitting: Vascular Surgery

## 2020-03-09 ENCOUNTER — Ambulatory Visit (INDEPENDENT_AMBULATORY_CARE_PROVIDER_SITE_OTHER)
Admission: RE | Admit: 2020-03-09 | Discharge: 2020-03-09 | Disposition: A | Discharge status: Home / Self Care | Payer: Medicare Other | Source: Ambulatory Visit | Attending: Vascular Surgery | Admitting: Vascular Surgery

## 2020-03-09 ENCOUNTER — Ambulatory Visit (INDEPENDENT_AMBULATORY_CARE_PROVIDER_SITE_OTHER): Payer: Medicare Other | Admitting: Vascular Surgery

## 2020-03-09 ENCOUNTER — Other Ambulatory Visit (HOSPITAL_COMMUNITY): Payer: Self-pay | Admitting: Vascular Surgery

## 2020-03-09 ENCOUNTER — Ambulatory Visit (HOSPITAL_COMMUNITY)
Admission: RE | Admit: 2020-03-09 | Discharge: 2020-03-09 | Disposition: A | Discharge status: Home / Self Care | Payer: Medicare Other | Source: Ambulatory Visit | Attending: Vascular Surgery | Admitting: Vascular Surgery

## 2020-03-09 ENCOUNTER — Other Ambulatory Visit (HOSPITAL_COMMUNITY)
Admission: RE | Admit: 2020-03-09 | Discharge: 2020-03-09 | Disposition: A | Discharge status: Home / Self Care | Payer: Medicare Other | Source: Ambulatory Visit | Attending: Vascular Surgery | Admitting: Vascular Surgery

## 2020-03-09 VITALS — BP 117/73 | HR 77 | Temp 97.3°F | Resp 20 | Ht 71.0 in | Wt 338.0 lb

## 2020-03-09 DIAGNOSIS — N186 End stage renal disease: Secondary | ICD-10-CM | POA: Diagnosis not present

## 2020-03-09 DIAGNOSIS — Z6841 Body Mass Index (BMI) 40.0 and over, adult: Secondary | ICD-10-CM | POA: Diagnosis not present

## 2020-03-09 DIAGNOSIS — Z20822 Contact with and (suspected) exposure to covid-19: Secondary | ICD-10-CM | POA: Diagnosis not present

## 2020-03-09 DIAGNOSIS — E669 Obesity, unspecified: Secondary | ICD-10-CM | POA: Diagnosis not present

## 2020-03-09 DIAGNOSIS — Z87891 Personal history of nicotine dependence: Secondary | ICD-10-CM | POA: Diagnosis not present

## 2020-03-09 DIAGNOSIS — Z992 Dependence on renal dialysis: Secondary | ICD-10-CM | POA: Diagnosis not present

## 2020-03-09 DIAGNOSIS — T8249XA Other complication of vascular dialysis catheter, initial encounter: Secondary | ICD-10-CM | POA: Diagnosis not present

## 2020-03-09 DIAGNOSIS — Z86711 Personal history of pulmonary embolism: Secondary | ICD-10-CM | POA: Diagnosis not present

## 2020-03-09 DIAGNOSIS — N184 Chronic kidney disease, stage 4 (severe): Secondary | ICD-10-CM | POA: Diagnosis not present

## 2020-03-09 DIAGNOSIS — Z86718 Personal history of other venous thrombosis and embolism: Secondary | ICD-10-CM | POA: Diagnosis not present

## 2020-03-09 DIAGNOSIS — Z79899 Other long term (current) drug therapy: Secondary | ICD-10-CM | POA: Diagnosis not present

## 2020-03-09 DIAGNOSIS — I714 Abdominal aortic aneurysm, without rupture: Secondary | ICD-10-CM | POA: Diagnosis not present

## 2020-03-09 DIAGNOSIS — Z885 Allergy status to narcotic agent status: Secondary | ICD-10-CM | POA: Diagnosis not present

## 2020-03-09 DIAGNOSIS — Z8673 Personal history of transient ischemic attack (TIA), and cerebral infarction without residual deficits: Secondary | ICD-10-CM | POA: Diagnosis not present

## 2020-03-09 DIAGNOSIS — Z7982 Long term (current) use of aspirin: Secondary | ICD-10-CM | POA: Diagnosis not present

## 2020-03-09 DIAGNOSIS — I120 Hypertensive chronic kidney disease with stage 5 chronic kidney disease or end stage renal disease: Secondary | ICD-10-CM | POA: Diagnosis not present

## 2020-03-09 LAB — SARS CORONAVIRUS 2 (TAT 6-24 HRS): SARS Coronavirus 2: NEGATIVE

## 2020-03-09 NOTE — Progress Notes (Signed)
Vascular and Vein Specialist of [place]  Patient name: [name] MRN: [id number] DOB: Apr 19, 1971 Sex: male  REASON FOR VISIT: Evaluation for new hemodialysis access  HPI: [name] is a 49 y.o. male here for discussion of access for hemodialysis he has a very complex past history of access.  He initially had left radiocephalic fistula creation by Dr. Kellie Simmering in 2016.  He subsequently underwent ligation of competing branches and superficial mobilization.  He is remained chronic with his renal insufficiency and had not been on dialysis until 2 months ago.  He had developed aneurysmal change of his fistula near the arteriovenous anastomosis and in January 2019 I had resected a large aneurysm above his wrist.  He recently began dialysis and had multiple episodes of what sounds like infiltration and had a right IJ tunneled catheter placed at [facility] which she is currently using for hemodialysis.  He is here today for discussion of other options.  He is left-handed.  Has a history of aortic dissection with aortic root replacement and aneurysm repair  Past Medical History:  Diagnosis Date  . Abdominal aortic aneurysm ([place])   . Anemia   . Aortic regurgitation 12/10/08   Echo: mild to mod. AOV regurg,mild AO root dilatation, LA mildly dilated, EF 65%, LV wall thickness markedly increased, small PE  . Chronic kidney disease   . Chronic renal disease    advanced  . DVT (deep venous thrombosis) ([place])   . H/O aortic dissection 2009   infarenal abdominal aortic dissection  . History of pulmonary embolus (PE) 2010  . Hypertension    dr [name]  . Left ventricular hypertrophy   . Obesity   . Stroke [facility] 2009   pt says no stroke    Family History  Problem Relation Age of Onset  . Hypertension Mother   . Hypertension Father     SOCIAL HISTORY: Social History   Tobacco Use  . Smoking status: Former Smoker    Years: 3.00    Types:  Cigarettes    Quit date: 01/25/1991    Years since quitting: 29.1  . Smokeless tobacco: Never Used  Substance Use Topics  . Alcohol use: No    Alcohol/week: 0.0 standard drinks    Allergies  Allergen Reactions  . Oxycodone Hives    Other reaction(s): Hives    Current Outpatient Medications  Medication Sig Dispense Refill  . acetaminophen (TYLENOL) 500 MG tablet Take 1,000 mg by mouth every 6 (six) hours as needed for moderate pain or headache. Reported on 12/14/2015    . amLODipine (NORVASC) 10 MG tablet Take 10 mg by mouth at bedtime.    [name] aspirin 81 MG EC tablet Take 81 mg by mouth 2 (two) times daily.     [name] atorvastatin (LIPITOR) 10 MG tablet Take 1 tablet (10 mg total) by mouth daily. Please make annual appt with Dr. Sallyanne Kuster in April for refills.Thank you. 90 tablet 0  . B Complex-C-Folic Acid (DIALYVITE 595) 0.8 MG TABS Take 1 tablet by mouth daily.    . calcitRIOL (ROCALTROL) 0.25 MCG capsule Take 0.25 mcg by mouth daily.    . carvedilol (COREG) 25 MG tablet Take 1 tablet (25 mg total) by mouth 2 (two) times daily. 638 tablet 3  . folic acid-vitamin b complex-vitamin c-selenium-zinc (DIALYVITE) 3 MG TABS tablet     . Methoxy PEG-Epoetin Beta (MIRCERA IJ) Mircera    . OVER THE COUNTER MEDICATION Place 1 drop into both eyes  daily. OVER THE COUNTER NUMBING EYE DROPS    . sertraline (ZOLOFT) 50 MG tablet Take 1 tablet (50 mg total) by mouth daily. 30 tablet 2  . temazepam (RESTORIL) 30 MG capsule Take 1 capsule (30 mg total) by mouth at bedtime as needed for sleep. 30 capsule 3  . apixaban (ELIQUIS) 2.5 MG TABS tablet Take 1 tablet (2.5 mg total) by mouth 2 (two) times daily. (Patient not taking: Reported on 03/09/2020) 180 tablet 3   No current facility-administered medications for this visit.    REVIEW OF SYSTEMS:  [X]  denotes positive finding, [ ]  denotes negative finding Cardiac  Comments:  Chest pain or chest pressure:    Shortness of breath upon exertion:    Short of  breath when lying flat:    Irregular heart rhythm:        Vascular    Pain in calf, thigh, or hip brought on by ambulation:    Pain in feet at night that wakes you up from your sleep:     Blood clot in your veins:    Leg swelling:           PHYSICAL EXAM: Vitals:   03/09/20 0952  BP: 117/73  Pulse: 77  Resp: 20  Temp: (!) 97.3 F (36.3 C)  SpO2: 95%  Weight: (!) 153.3 kg  Height: 5\' 11"  (1.803 m)    GENERAL: The patient is a well-nourished male, in no acute distress. The vital signs are documented above. CARDIOVASCULAR: Mega fistula on his left forearm.  Has extreme tortuosity throughout his forearm.  His cephalic vein above the antecubital space is also quite dilated and has a great deal of tortuosity as well. PULMONARY: There is good air exchange  MUSCULOSKELETAL: There are no major deformities or cyanosis. NEUROLOGIC: No focal weakness or paresthesias are detected. SKIN: There are no ulcers or rashes noted. PSYCHIATRIC: The patient has a normal affect.  DATA:  Duplex today shows adequate right cephalic vein and adequate basilic veins bilaterally for fistula.  MEDICAL ISSUES: I discussed options with the patient and his wife by telephone.  I discussed the options of attempted salvage of his forearm fistula.  I am concerned due to the fact that he has had multiple prior treatments with superficial cessation and aneurysm resection that this would be low chance for success.  I imaged his upper arm left cephalic vein with SonoSite ultrasound.  This is markedly dilated throughout its course.  He does have tortuosity of this as well.  I have recommended ligation of his radiocephalic fistula due to the very large aneurysmal changes throughout.  Also have recommended a upper arm brachiocephalic fistula.  I explained that he will require a transposition of the vein and retunneling superficially to both straighten the vein and also bring it superficial.  Explained this will require  several incisions including antecubital incision for the anastomosis and then one at his shoulder and one in the upper arm for mobilization of the vein and transposition.  We will schedule this on a nondialysis day    [name], MD [facility] Vascular and Vein Specialists of [facility] Tel [phone] Pager [phone]

## 2020-03-09 NOTE — Progress Notes (Signed)
   03/09/20 1831  OBSTRUCTIVE SLEEP APNEA  Have you ever been diagnosed with sleep apnea through a sleep study? No  Do you snore loudly (loud enough to be heard through closed doors)?  1  Do you often feel tired, fatigued, or sleepy during the daytime (such as falling asleep during driving or talking to someone)? 0  Has anyone observed you stop breathing during your sleep? 1  Do you have, or are you being treated for high blood pressure? 1  BMI more than 35 kg/m2? 1  Age > 50 (1-yes) 1  Neck circumference greater than:Male 16 inches or larger, Male 17inches or larger? 1  Male Gender (Yes=1) 1  Obstructive Sleep Apnea Score 7

## 2020-03-09 NOTE — Progress Notes (Signed)
[name] denies chest pain or shortness of breath. Patient was tested for Covid today and is in quarantine with his wife.  [name] has history of PE and DVT, patient was on Eliquis but can no longer afford it, MD has ordered patient taking 2 81 mg of Aspirin.

## 2020-03-09 NOTE — H&P (View-Only) (Signed)
Vascular and Vein Specialist of [place]  Patient name: [name] MRN: [id number] DOB: 30-Apr-1971 Sex: male  REASON FOR VISIT: Evaluation for new hemodialysis access  HPI: [name] is a 49 y.o. male here for discussion of access for hemodialysis he has a very complex past history of access.  He initially had left radiocephalic fistula creation by Dr. Kellie Simmering in 2016.  He subsequently underwent ligation of competing branches and superficial mobilization.  He is remained chronic with his renal insufficiency and had not been on dialysis until 2 months ago.  He had developed aneurysmal change of his fistula near the arteriovenous anastomosis and in January 2019 I had resected a large aneurysm above his wrist.  He recently began dialysis and had multiple episodes of what sounds like infiltration and had a right IJ tunneled catheter placed at [facility] which she is currently using for hemodialysis.  He is here today for discussion of other options.  He is left-handed.  Has a history of aortic dissection with aortic root replacement and aneurysm repair  Past Medical History:  Diagnosis Date  . Abdominal aortic aneurysm ([place])   . Anemia   . Aortic regurgitation 12/10/08   Echo: mild to mod. AOV regurg,mild AO root dilatation, LA mildly dilated, EF 65%, LV wall thickness markedly increased, small PE  . Chronic kidney disease   . Chronic renal disease    advanced  . DVT (deep venous thrombosis) ([place])   . H/O aortic dissection 2009   infarenal abdominal aortic dissection  . History of pulmonary embolus (PE) 2010  . Hypertension    dr [name]  . Left ventricular hypertrophy   . Obesity   . Stroke [facility] 2009   pt says no stroke    Family History  Problem Relation Age of Onset  . Hypertension Mother   . Hypertension Father     SOCIAL HISTORY: Social History   Tobacco Use  . Smoking status: Former Smoker    Years: 3.00    Types:  Cigarettes    Quit date: 01/25/1991    Years since quitting: 29.1  . Smokeless tobacco: Never Used  Substance Use Topics  . Alcohol use: No    Alcohol/week: 0.0 standard drinks    Allergies  Allergen Reactions  . Oxycodone Hives    Other reaction(s): Hives    Current Outpatient Medications  Medication Sig Dispense Refill  . acetaminophen (TYLENOL) 500 MG tablet Take 1,000 mg by mouth every 6 (six) hours as needed for moderate pain or headache. Reported on 12/14/2015    . amLODipine (NORVASC) 10 MG tablet Take 10 mg by mouth at bedtime.    [name] aspirin 81 MG EC tablet Take 81 mg by mouth 2 (two) times daily.     [name] atorvastatin (LIPITOR) 10 MG tablet Take 1 tablet (10 mg total) by mouth daily. Please make annual appt with Dr. Sallyanne Kuster in April for refills.Thank you. 90 tablet 0  . B Complex-C-Folic Acid (DIALYVITE 400) 0.8 MG TABS Take 1 tablet by mouth daily.    . calcitRIOL (ROCALTROL) 0.25 MCG capsule Take 0.25 mcg by mouth daily.    . carvedilol (COREG) 25 MG tablet Take 1 tablet (25 mg total) by mouth 2 (two) times daily. 867 tablet 3  . folic acid-vitamin b complex-vitamin c-selenium-zinc (DIALYVITE) 3 MG TABS tablet     . Methoxy PEG-Epoetin Beta (MIRCERA IJ) Mircera    . OVER THE COUNTER MEDICATION Place 1 drop into both eyes  daily. OVER THE COUNTER NUMBING EYE DROPS    . sertraline (ZOLOFT) 50 MG tablet Take 1 tablet (50 mg total) by mouth daily. 30 tablet 2  . temazepam (RESTORIL) 30 MG capsule Take 1 capsule (30 mg total) by mouth at bedtime as needed for sleep. 30 capsule 3  . apixaban (ELIQUIS) 2.5 MG TABS tablet Take 1 tablet (2.5 mg total) by mouth 2 (two) times daily. (Patient not taking: Reported on 03/09/2020) 180 tablet 3   No current facility-administered medications for this visit.    REVIEW OF SYSTEMS:  [X]  denotes positive finding, [ ]  denotes negative finding Cardiac  Comments:  Chest pain or chest pressure:    Shortness of breath upon exertion:    Short of  breath when lying flat:    Irregular heart rhythm:        Vascular    Pain in calf, thigh, or hip brought on by ambulation:    Pain in feet at night that wakes you up from your sleep:     Blood clot in your veins:    Leg swelling:           PHYSICAL EXAM: Vitals:   03/09/20 0952  BP: 117/73  Pulse: 77  Resp: 20  Temp: (!) 97.3 F (36.3 C)  SpO2: 95%  Weight: (!) 153.3 kg  Height: 5\' 11"  (1.803 m)    GENERAL: The patient is a well-nourished male, in no acute distress. The vital signs are documented above. CARDIOVASCULAR: Mega fistula on his left forearm.  Has extreme tortuosity throughout his forearm.  His cephalic vein above the antecubital space is also quite dilated and has a great deal of tortuosity as well. PULMONARY: There is good air exchange  MUSCULOSKELETAL: There are no major deformities or cyanosis. NEUROLOGIC: No focal weakness or paresthesias are detected. SKIN: There are no ulcers or rashes noted. PSYCHIATRIC: The patient has a normal affect.  DATA:  Duplex today shows adequate right cephalic vein and adequate basilic veins bilaterally for fistula.  MEDICAL ISSUES: I discussed options with the patient and his wife by telephone.  I discussed the options of attempted salvage of his forearm fistula.  I am concerned due to the fact that he has had multiple prior treatments with superficial cessation and aneurysm resection that this would be low chance for success.  I imaged his upper arm left cephalic vein with SonoSite ultrasound.  This is markedly dilated throughout its course.  He does have tortuosity of this as well.  I have recommended ligation of his radiocephalic fistula due to the very large aneurysmal changes throughout.  Also have recommended a upper arm brachiocephalic fistula.  I explained that he will require a transposition of the vein and retunneling superficially to both straighten the vein and also bring it superficial.  Explained this will require  several incisions including antecubital incision for the anastomosis and then one at his shoulder and one in the upper arm for mobilization of the vein and transposition.  We will schedule this on a nondialysis day    [name], MD [facility] Vascular and Vein Specialists of [facility] Tel [phone] Pager [phone]

## 2020-03-10 ENCOUNTER — Telehealth: Payer: Self-pay | Admitting: Family Medicine

## 2020-03-10 DIAGNOSIS — N186 End stage renal disease: Secondary | ICD-10-CM | POA: Diagnosis not present

## 2020-03-10 DIAGNOSIS — G4730 Sleep apnea, unspecified: Secondary | ICD-10-CM

## 2020-03-10 DIAGNOSIS — Z23 Encounter for immunization: Secondary | ICD-10-CM | POA: Diagnosis not present

## 2020-03-10 DIAGNOSIS — D509 Iron deficiency anemia, unspecified: Secondary | ICD-10-CM | POA: Diagnosis not present

## 2020-03-10 DIAGNOSIS — N2581 Secondary hyperparathyroidism of renal origin: Secondary | ICD-10-CM | POA: Diagnosis not present

## 2020-03-10 DIAGNOSIS — Z283 Underimmunization status: Secondary | ICD-10-CM | POA: Diagnosis not present

## 2020-03-10 DIAGNOSIS — D631 Anemia in chronic kidney disease: Secondary | ICD-10-CM | POA: Diagnosis not present

## 2020-03-10 DIAGNOSIS — T8249XD Other complication of vascular dialysis catheter, subsequent encounter: Secondary | ICD-10-CM | POA: Diagnosis not present

## 2020-03-10 DIAGNOSIS — D689 Coagulation defect, unspecified: Secondary | ICD-10-CM | POA: Diagnosis not present

## 2020-03-10 DIAGNOSIS — Z992 Dependence on renal dialysis: Secondary | ICD-10-CM | POA: Diagnosis not present

## 2020-03-10 MED ORDER — DEXTROSE 5 % IV SOLN
3.0000 g | INTRAVENOUS | Status: AC
  Administered 2020-03-11: 3 g via INTRAVENOUS
  Filled 2020-03-10: qty 3

## 2020-03-10 NOTE — Telephone Encounter (Signed)
Done

## 2020-03-10 NOTE — Anesthesia Preprocedure Evaluation (Addendum)
Anesthesia Evaluation  Patient identified by MRN, date of birth, ID band Patient awake    Reviewed: Allergy & Precautions, NPO status , Patient's Chart, lab work & pertinent test results  History of Anesthesia Complications Negative for: history of anesthetic complications  Airway Mallampati: III  TM Distance: >3 FB Neck ROM: Full    Dental  (+) Dental Advisory Given   Pulmonary neg recent URI, former smoker,    breath sounds clear to auscultation       Cardiovascular hypertension, Pt. on medications and Pt. on home beta blockers + Peripheral Vascular Disease   Rhythm:Regular     Neuro/Psych neg Seizures PSYCHIATRIC DISORDERS Anxiety patient denies cva CVA    GI/Hepatic   Endo/Other  Morbid obesity  Renal/GU ESRFRenal disease     Musculoskeletal   Abdominal   Peds  Hematology  (+) anemia , eliquis   Anesthesia Other Findings   Reproductive/Obstetrics                            Anesthesia Physical Anesthesia Plan  ASA: IV  Anesthesia Plan: General   Post-op Pain Management:    Induction: Intravenous  PONV Risk Score and Plan: 2 and Treatment may vary due to age or medical condition and Propofol infusion  Airway Management Planned: LMA and Oral ETT  Additional Equipment: None  Intra-op Plan:   Post-operative Plan: Extubation in OR  Informed Consent: I have reviewed the patients History and Physical, chart, labs and discussed the procedure including the risks, benefits and alternatives for the proposed anesthesia with the patient or authorized representative who has indicated his/her understanding and acceptance.     Dental advisory given  Plan Discussed with: CRNA and Surgeon  Anesthesia Plan Comments: ( Follows with cardiology for hx of aneurysm of the ascending aorta s/p Yacoub valve sparing root repair and 23mm HAART300 aortic annuloplasty(Dr. Tye [place], 2018), ,  history of stent graft repair for abdominal aortic aneurysmand dissection (Dr. Kellie Simmering, 2013), bilateral iliac artery aneurysms, hypertensive heart disease with severe left ventricular hypertrophy and well compensated diastolic heart failure, mild to moderate aortic insufficiency,history of DVT and pulmonary embolism on chronic anticoagulation (pt reports unable to afford Eliquis and has been advised to take two 81mg  ASA daily).  Followed by cardiology at [facility] (Dr. Sallyanne Kuster) and cardiothoracic surgery at [place] (Dr. Ysidro Evert).  Last seen by Dr. Sallyanne Kuster 04/24/19, overall doing well at that time. Discussed some concern for cardiac amyloidosis but low suspicion and additional workup could be postponed due to covid. 75yr followup recommended.  Last seen by Dr. Ysidro Evert 10/17/19. Per note, "[name] presents for ongoing aortic surveillance status post [name] valve-sparing root replacement with coronary reconstruction along with repair of aortic valve with a #17mm HAART 300 aortic ring annuloplasty on 05/07/2017. Dyspnea and fatigue are likely multifactorial due to anemia, renal dysfunction, and obesity. Aortic imaging today demonstrates stable appearance of aorta with trace AI. Based on this, we recommend ongoing aortic surveillance with cardiac MRI in 2 year.   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 nephrologist is Dr. Edrick Oh.  Will need DOS labs and eval.   Cardiac MRI 10/17/19 (care everywhere): Cardiac MRI: 1. The left ventricle is normal in cavity size. There is concentric severe left ventricular hypertrophy with a septal wall thickness of 2.1 cm (prior 2.0 cm) with a calculated myocardial mass of 325g (prior 328g). Global systolic function is hyperdynamic. The LV ejection fraction is 80% (prior  81%). There are no regional wall motion abnormalities.  2. The right ventricle is normal in cavity size, wall thickness, and systolic function.  3. Mildly enlarged left atrium.  Normal sized right  atrium  4. The aortic valve is trileaflet (s/p annuloplasty) in morphology and function. The peak aortic valve 3.5 m/s (prior 2.9 m/s). There is no significant valvular disease.  5. T2* was 36 ms (prior 42 ms), indicating the patient does not have iron overload.  6. Native T1 is 1482 ms (prior 1421 ms) (upper limit of normal is 1284 ms);  these findings could suggest cardiac amyloidosis. However, without gadolinium administration this finding cannot be confirmed.   Chest MRA 1. Postsurgical changes of aortic root replacement without evidence of complication.  There has been reimplantation of the coronary arteritis.    2. The aortic arch is left sided. There is normal branching of the arch vessels.  3. The main pulmonary artery is enlarged, which can suggest pulmonary hypertension   4. Trace bilateral pleural effusions are present  5. Cholelithiasis, incompletely evaluated. )      Anesthesia Quick Evaluation

## 2020-03-10 NOTE — Progress Notes (Signed)
Anesthesia Chart Review: Same day workup  Follows with cardiology for hx of aneurysm of the ascending aorta s/p Yacoub valve sparing root repair and 4mm HAART300 aortic annuloplasty(Dr. Ysidro Evert, [facility], 2018), , history of stent graft repair for abdominal aortic aneurysmand dissection (Dr. Kellie Simmering, 2013), bilateral iliac artery aneurysms, hypertensive heart disease with severe left ventricular hypertrophy and well compensated diastolic heart failure, mild to moderate aortic insufficiency,history of DVT and pulmonary embolism on chronic anticoagulation (pt reports unable to afford Eliquis and has been advised to take two 81mg  ASA daily).  Followed by cardiology at [facility] (Dr. Sallyanne Kuster) and cardiothoracic surgery at [place] (Dr. Ysidro Evert).  Last seen by Dr. Sallyanne Kuster 04/24/19, overall doing well at that time. Discussed some concern for cardiac amyloidosis but low suspicion and additional workup could be postponed due to covid. 14yr followup recommended.  Last seen by Dr. Ysidro Evert 10/17/19. Per note, "[name] presents for ongoing aortic surveillance status post [name] valve-sparing root replacement with coronary reconstruction along with repair of aortic valve with a #68mm HAART 300 aortic ring annuloplasty on 05/07/2017. Dyspnea and fatigue are likely multifactorial due to anemia, renal dysfunction, and obesity. Aortic imaging today demonstrates stable appearance of aorta with trace AI. Based on this, we recommend ongoing aortic surveillance with cardiac MRI in 2 year.   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 nephrologist is Dr. Edrick Oh.  Will need DOS labs and eval.   Cardiac MRI 10/17/19 (care everywhere): Cardiac MRI: 1. The left ventricle is normal in cavity size. There is concentric severe left ventricular hypertrophy with a septal wall thickness of 2.1 cm (prior 2.0 cm) with a calculated myocardial mass of 325g (prior 328g). Global systolic function is hyperdynamic. The LV ejection fraction is 80%  (prior 81%). There are no regional wall motion abnormalities.  2. The right ventricle is normal in cavity size, wall thickness, and systolic function.  3. Mildly enlarged left atrium.  Normal sized right atrium  4. The aortic valve is trileaflet (s/p annuloplasty) in morphology and function. The peak aortic valve 3.5 m/s (prior 2.9 m/s). There is no significant valvular disease.  5. T2* was 36 ms (prior 42 ms), indicating the patient does not have iron overload.  6. Native T1 is 1482 ms (prior 1421 ms) (upper limit of normal is 1284 ms);  these findings could suggest cardiac amyloidosis. However, without gadolinium administration this finding cannot be confirmed.   Chest MRA 1. Postsurgical changes of aortic root replacement without evidence of complication.  There has been reimplantation of the coronary arteritis.    2. The aortic arch is left sided. There is normal branching of the arch vessels.  3. The main pulmonary artery is enlarged, which can suggest pulmonary hypertension   4. Trace bilateral pleural effusions are present  5. Cholelithiasis, incompletely evaluated.    Wynonia Musty [facility]/Anesthesiology Phone [phone] 03/10/2020 11:41 AM

## 2020-03-11 ENCOUNTER — Other Ambulatory Visit: Payer: Self-pay

## 2020-03-11 ENCOUNTER — Ambulatory Visit (HOSPITAL_COMMUNITY): Payer: Medicare Other | Admitting: Physician Assistant

## 2020-03-11 ENCOUNTER — Encounter (HOSPITAL_COMMUNITY): Payer: Self-pay | Admitting: Vascular Surgery

## 2020-03-11 ENCOUNTER — Ambulatory Visit (HOSPITAL_COMMUNITY)
Admission: RE | Admit: 2020-03-11 | Discharge: 2020-03-11 | Disposition: A | Discharge status: Home / Self Care | Payer: Medicare Other | Attending: Vascular Surgery | Admitting: Vascular Surgery

## 2020-03-11 ENCOUNTER — Encounter (HOSPITAL_COMMUNITY)
Admission: RE | Disposition: A | Discharge status: Home / Self Care | Payer: Self-pay | Source: Home / Self Care | Attending: Vascular Surgery

## 2020-03-11 DIAGNOSIS — I120 Hypertensive chronic kidney disease with stage 5 chronic kidney disease or end stage renal disease: Secondary | ICD-10-CM | POA: Diagnosis not present

## 2020-03-11 DIAGNOSIS — Z86718 Personal history of other venous thrombosis and embolism: Secondary | ICD-10-CM | POA: Diagnosis not present

## 2020-03-11 DIAGNOSIS — N186 End stage renal disease: Secondary | ICD-10-CM | POA: Diagnosis not present

## 2020-03-11 DIAGNOSIS — Z79899 Other long term (current) drug therapy: Secondary | ICD-10-CM | POA: Insufficient documentation

## 2020-03-11 DIAGNOSIS — Z87891 Personal history of nicotine dependence: Secondary | ICD-10-CM | POA: Insufficient documentation

## 2020-03-11 DIAGNOSIS — Z20822 Contact with and (suspected) exposure to covid-19: Secondary | ICD-10-CM | POA: Diagnosis not present

## 2020-03-11 DIAGNOSIS — Z8673 Personal history of transient ischemic attack (TIA), and cerebral infarction without residual deficits: Secondary | ICD-10-CM | POA: Insufficient documentation

## 2020-03-11 DIAGNOSIS — Z885 Allergy status to narcotic agent status: Secondary | ICD-10-CM | POA: Diagnosis not present

## 2020-03-11 DIAGNOSIS — I714 Abdominal aortic aneurysm, without rupture: Secondary | ICD-10-CM | POA: Insufficient documentation

## 2020-03-11 DIAGNOSIS — I739 Peripheral vascular disease, unspecified: Secondary | ICD-10-CM | POA: Diagnosis not present

## 2020-03-11 DIAGNOSIS — Z7982 Long term (current) use of aspirin: Secondary | ICD-10-CM | POA: Diagnosis not present

## 2020-03-11 DIAGNOSIS — T82898A Other specified complication of vascular prosthetic devices, implants and grafts, initial encounter: Secondary | ICD-10-CM | POA: Diagnosis not present

## 2020-03-11 DIAGNOSIS — E669 Obesity, unspecified: Secondary | ICD-10-CM | POA: Insufficient documentation

## 2020-03-11 DIAGNOSIS — Z992 Dependence on renal dialysis: Secondary | ICD-10-CM | POA: Diagnosis not present

## 2020-03-11 DIAGNOSIS — Z6841 Body Mass Index (BMI) 40.0 and over, adult: Secondary | ICD-10-CM | POA: Insufficient documentation

## 2020-03-11 DIAGNOSIS — Z86711 Personal history of pulmonary embolism: Secondary | ICD-10-CM | POA: Diagnosis not present

## 2020-03-11 HISTORY — PX: BASCILIC VEIN TRANSPOSITION: SHX5742

## 2020-03-11 HISTORY — DX: Other complications of anesthesia, initial encounter: T88.59XA

## 2020-03-11 LAB — POCT I-STAT, CHEM 8
BUN: 15 mg/dL (ref 6–20)
Calcium, Ion: 1.16 mmol/L (ref 1.15–1.40)
Chloride: 96 mmol/L — ABNORMAL LOW (ref 98–111)
Creatinine, Ser: 5.1 mg/dL — ABNORMAL HIGH (ref 0.61–1.24)
Glucose, Bld: 107 mg/dL — ABNORMAL HIGH (ref 70–99)
HCT: 34 % — ABNORMAL LOW (ref 39.0–52.0)
Hemoglobin: 11.6 g/dL — ABNORMAL LOW (ref 13.0–17.0)
Potassium: 2.9 mmol/L — ABNORMAL LOW (ref 3.5–5.1)
Sodium: 139 mmol/L (ref 135–145)
TCO2: 34 mmol/L — ABNORMAL HIGH (ref 22–32)

## 2020-03-11 SURGERY — TRANSPOSITION, VEIN, BASILIC
Anesthesia: General | Laterality: Left

## 2020-03-11 MED ORDER — SODIUM CHLORIDE 0.9 % IV SOLN
INTRAVENOUS | Status: AC
  Filled 2020-03-11: qty 1.2

## 2020-03-11 MED ORDER — 0.9 % SODIUM CHLORIDE (POUR BTL) OPTIME
TOPICAL | Status: DC | PRN
  Administered 2020-03-11: 1000 mL

## 2020-03-11 MED ORDER — DEXAMETHASONE SODIUM PHOSPHATE 10 MG/ML IJ SOLN
INTRAMUSCULAR | Status: AC
  Filled 2020-03-11: qty 1

## 2020-03-11 MED ORDER — MIDAZOLAM HCL 2 MG/2ML IJ SOLN
INTRAMUSCULAR | Status: AC
  Filled 2020-03-11: qty 2

## 2020-03-11 MED ORDER — TRAMADOL HCL 50 MG PO TABS: 50.0000 mg | ORAL_TABLET | Freq: Four times a day (QID) | ORAL | 0 refills | Status: DC | PRN

## 2020-03-11 MED ORDER — EPHEDRINE SULFATE-NACL 50-0.9 MG/10ML-% IV SOSY
PREFILLED_SYRINGE | INTRAVENOUS | Status: DC | PRN
  Administered 2020-03-11 (×4): 10 mg via INTRAVENOUS

## 2020-03-11 MED ORDER — CHLORHEXIDINE GLUCONATE 4 % EX LIQD: 60.0000 mL | Freq: Once | CUTANEOUS | Status: DC

## 2020-03-11 MED ORDER — PROPOFOL 10 MG/ML IV BOLUS
INTRAVENOUS | Status: DC | PRN
  Administered 2020-03-11: 60 mg via INTRAVENOUS
  Administered 2020-03-11: 140 mg via INTRAVENOUS

## 2020-03-11 MED ORDER — ONDANSETRON HCL 4 MG/2ML IJ SOLN
INTRAMUSCULAR | Status: DC | PRN
  Administered 2020-03-11: 4 mg via INTRAVENOUS

## 2020-03-11 MED ORDER — FENTANYL CITRATE (PF) 250 MCG/5ML IJ SOLN
INTRAMUSCULAR | Status: DC | PRN
  Administered 2020-03-11: 50 ug via INTRAVENOUS
  Administered 2020-03-11 (×6): 25 ug via INTRAVENOUS

## 2020-03-11 MED ORDER — SODIUM CHLORIDE 0.9 % IV SOLN
INTRAVENOUS | Status: DC | PRN
  Administered 2020-03-11: 500 mL

## 2020-03-11 MED ORDER — LIDOCAINE-EPINEPHRINE 1 %-1:100000 IJ SOLN
INTRAMUSCULAR | Status: AC
  Filled 2020-03-11: qty 1

## 2020-03-11 MED ORDER — LIDOCAINE 2% (20 MG/ML) 5 ML SYRINGE
INTRAMUSCULAR | Status: AC
  Filled 2020-03-11: qty 5

## 2020-03-11 MED ORDER — PROPOFOL 10 MG/ML IV BOLUS
INTRAVENOUS | Status: AC
  Filled 2020-03-11: qty 20

## 2020-03-11 MED ORDER — DEXAMETHASONE SODIUM PHOSPHATE 10 MG/ML IJ SOLN
INTRAMUSCULAR | Status: DC | PRN
  Administered 2020-03-11: 4 mg via INTRAVENOUS

## 2020-03-11 MED ORDER — FENTANYL CITRATE (PF) 250 MCG/5ML IJ SOLN
INTRAMUSCULAR | Status: AC
  Filled 2020-03-11: qty 5

## 2020-03-11 MED ORDER — LIDOCAINE 2% (20 MG/ML) 5 ML SYRINGE
INTRAMUSCULAR | Status: DC | PRN
  Administered 2020-03-11: 60 mg via INTRAVENOUS

## 2020-03-11 MED ORDER — SODIUM CHLORIDE 0.9 % IV SOLN: INTRAVENOUS | Status: DC

## 2020-03-11 SURGICAL SUPPLY — 28 items
ADH SKN CLS APL DERMABOND .7 (GAUZE/BANDAGES/DRESSINGS) ×1
ARMBAND PINK RESTRICT EXTREMIT (MISCELLANEOUS) ×2 IMPLANT
CANISTER SUCT 3000ML PPV (MISCELLANEOUS) ×2 IMPLANT
CANNULA VESSEL 3MM 2 BLNT TIP (CANNULA) ×2 IMPLANT
CLIP LIGATING EXTRA MED SLVR (CLIP) ×2 IMPLANT
CLIP LIGATING EXTRA SM BLUE (MISCELLANEOUS) ×2 IMPLANT
COVER PROBE W GEL 5X96 (DRAPES) ×2 IMPLANT
DECANTER SPIKE VIAL GLASS SM (MISCELLANEOUS) ×2 IMPLANT
DERMABOND ADVANCED (GAUZE/BANDAGES/DRESSINGS) ×1
DERMABOND ADVANCED .7 DNX12 (GAUZE/BANDAGES/DRESSINGS) ×1 IMPLANT
ELECT REM PT RETURN 9FT ADLT (ELECTROSURGICAL) ×2
ELECTRODE REM PT RTRN 9FT ADLT (ELECTROSURGICAL) ×1 IMPLANT
GLOVE SS BIOGEL STRL SZ 7.5 (GLOVE) ×1 IMPLANT
GLOVE SUPERSENSE BIOGEL SZ 7.5 (GLOVE) ×1
GOWN STRL REUS W/ TWL LRG LVL3 (GOWN DISPOSABLE) ×3 IMPLANT
GOWN STRL REUS W/TWL LRG LVL3 (GOWN DISPOSABLE) ×6
KIT BASIN OR (CUSTOM PROCEDURE TRAY) ×2 IMPLANT
KIT TURNOVER KIT B (KITS) ×2 IMPLANT
NS IRRIG 1000ML POUR BTL (IV SOLUTION) ×2 IMPLANT
PACK CV ACCESS (CUSTOM PROCEDURE TRAY) ×2 IMPLANT
PAD ARMBOARD 7.5X6 YLW CONV (MISCELLANEOUS) ×4 IMPLANT
SPONGE LAP 18X18 RF (DISPOSABLE) ×1 IMPLANT
SUT PROLENE 6 0 CC (SUTURE) ×5 IMPLANT
SUT SILK 2 0 SH (SUTURE) IMPLANT
SUT VIC AB 3-0 SH 27 (SUTURE) ×6
SUT VIC AB 3-0 SH 27X BRD (SUTURE) ×1 IMPLANT
TOWEL GREEN STERILE (TOWEL DISPOSABLE) ×2 IMPLANT
UNDERPAD 30X30 (UNDERPADS AND DIAPERS) ×2 IMPLANT

## 2020-03-11 NOTE — Progress Notes (Signed)
Patient alert, oriented, sitting up in bed.  States "Comfortable".

## 2020-03-11 NOTE — Anesthesia Procedure Notes (Signed)
Procedure Name: LMA Insertion Date/Time: 03/11/2020 9:56 AM Performed by: Myna Bright, CRNA Pre-anesthesia Checklist: Patient identified, Emergency Drugs available, Suction available and Patient being monitored Patient Re-evaluated:Patient Re-evaluated prior to induction Oxygen Delivery Method: Circle system utilized Preoxygenation: Pre-oxygenation with 100% oxygen Induction Type: IV induction Ventilation: Mask ventilation without difficulty LMA: LMA inserted LMA Size: 5.0 Tube type: Oral Number of attempts: 1 Placement Confirmation: positive ETCO2 and breath sounds checked- equal and bilateral Tube secured with: Tape Dental Injury: Teeth and Oropharynx as per pre-operative assessment

## 2020-03-11 NOTE — Op Note (Signed)
    OPERATIVE REPORT  DATE OF SURGERY: 03/11/2020  PATIENT: [name], 49 y.o. male MRN: [id number]  DOB: Mar 18, 1971  PRE-OPERATIVE DIAGNOSIS: End-stage renal disease  POST-OPERATIVE DIAGNOSIS:  Same  PROCEDURE: Ligation of left radiocephalic fistula.  Translocation of left upper arm cephalic vein with brachiocephalic fistula  SURGEON:  Curt Jews, M.D.  PHYSICIAN ASSISTANT: Nurse  ANESTHESIA: General  EBL: per anesthesia record  Total I/O In: -  Out: 100 [Blood:100]  BLOOD ADMINISTERED: none  DRAINS: none  SPECIMEN: none  COUNTS CORRECT:  YES  PATIENT DISPOSITION:  PACU - hemodynamically stable  PROCEDURE DETAILS: Patient was taken operating placed supine position where the area of the left arm prepped draped you sterile fashion.  Patient had a huge Mega fistula which was excessively tortuous and his left forearm.  An incision was made over his prior radiocephalic anastomosis and the cephalic vein was ligated at this level.  Next a separate incision was made over cephalic vein at the antecubital space and another incision was made in the upper arm and then at the deltopectoral groove.  Patient had a very large cephalic vein throughout the upper arm as well.  The vein was mobilized circumferentially from the antecubital space to the shoulder and tributary branches were ligated with 3-0 silk ties and divided.  The vein was ligated at the antecubital space and divided and was brought out through the tunnel.  The vein was extremely tortuous and actually reached all the way down to the wrist.  The vein was of large size.  There were some areas of kinking in the scarring around the vein was excised to reduce the tortuosity and kinking of the vein.  Next a separate incision was made over the brachial artery.  The patient had a very large brachial artery with no evidence of atherosclerotic disease.  A subcutaneous tunnel was created from the brachial artery to the cephalic  vein at the deltopectoral groove.  The vein was brought back through this tunnel.  The vein was marked to reduce risk of twisting.  The artery was occluded proximally and distally and was opened with an 11 blade and sent longstanding with Potts scissors.  The was a very large size mismatch with a huge vein and not large artery.  The vein was's plicated with 6-0 Prolene suture to account for this.  The vein was sewn end-to-side to the artery with a running 6-0 Prolene suture.  Clamps removed and excellent thrill was noted.  Wounds irrigated with saline.  Hemostasis was obtained with electrocautery.  The wounds were closed with 3-0 Vicryl in the subcutaneous and subcuticular tissue.  Curlex and Ace wrap were applied the patient was transferred to the recovery room in stable condition   [name], M.D., [facility] 03/11/2020 12:54 PM

## 2020-03-11 NOTE — Discharge Instructions (Signed)
° °  [facility] ° °Discharge Instructions ° °AV Fistula or Graft Surgery for Dialysis Access ° °Please refer to the following instructions for your post-procedure care. Your surgeon or physician assistant will discuss any changes with you. ° °Activity ° °You may drive the day following your surgery, if you are comfortable and no longer taking prescription pain medication. Resume full activity as the soreness in your incision resolves. ° °Bathing/Showering ° °You may shower after you go home. Keep your incision dry for 48 hours. Do not soak in a bathtub, hot tub, or swim until the incision heals completely. You may not shower if you have a hemodialysis catheter. ° °Incision Care ° °Clean your incision with mild soap and water after 48 hours. Pat the area dry with a clean towel. You do not need a bandage unless otherwise instructed. Do not apply any ointments or creams to your incision. You may have skin glue on your incision. Do not peel it off. It will come off on its own in about one week. Your arm may swell a bit after surgery. To reduce swelling use pillows to elevate your arm so it is above your heart. Your doctor will tell you if you need to lightly wrap your arm with an ACE bandage. ° °Diet ° °Resume your normal diet. There are not special food restrictions following this procedure. In order to heal from your surgery, it is CRITICAL to get adequate nutrition. Your body requires vitamins, minerals, and protein. Vegetables are the best source of vitamins and minerals. Vegetables also provide the perfect balance of protein. Processed food has little nutritional value, so try to avoid this. ° °Medications ° °Resume taking all of your medications. If your incision is causing pain, you may take over-the counter pain relievers such as acetaminophen (Tylenol). If you were prescribed a stronger pain medication, please be aware these medications can cause nausea and constipation. Prevent  nausea by taking the medication with a snack or meal. Avoid constipation by drinking plenty of fluids and eating foods with high amount of fiber, such as fruits, vegetables, and grains. Do not take Tylenol if you are taking prescription pain medications. ° ° ° ° °Follow up °Your surgeon may want to see you in the office following your access surgery. If so, this will be arranged at the time of your surgery. ° °Please call us immediately for any of the following conditions: ° °Increased pain, redness, drainage (pus) from your incision site °Fever of 101 degrees or higher °Severe or worsening pain at your incision site °Hand pain or numbness. ° °Reduce your risk of vascular disease: ° °Stop smoking. If you would like help, call [facility] at 1-800-QUIT-NOW (1-800-784-8669) or [place] at [phone] ° °Manage your cholesterol °Maintain a desired weight °Control your diabetes °Keep your blood pressure down ° °Dialysis ° °It will take several weeks to several months for your new dialysis access to be ready for use. Your surgeon will determine when it is OK to use it. Your nephrologist will continue to direct your dialysis. You can continue to use your Permcath until your new access is ready for use. ° °If you have any questions, please call the office at [phone]. ° °

## 2020-03-11 NOTE — Transfer of Care (Signed)
Immediate Anesthesia Transfer of Care Note  Patient: [name]  Procedure(s) Performed: LEFT ARM ARTERIOVENOUS FISTULA  TRANSPOSITION (Left )  Patient Location: PACU  Anesthesia Type:General  Level of Consciousness: awake, alert , oriented and patient cooperative  Airway & Oxygen Therapy: Patient Spontanous Breathing and Patient connected to face mask oxygen  Post-op Assessment: Report given to RN, Post -op Vital signs reviewed and stable and Patient moving all extremities  Post vital signs: Reviewed and stable  Last Vitals:  Vitals Value Taken Time  BP 141/87 03/11/20 1301  Temp 36.4 C 03/11/20 1300  Pulse 73 03/11/20 1304  Resp 21 03/11/20 1304  SpO2 93 % 03/11/20 1304  Vitals shown include unvalidated device data.  Last Pain:  Vitals:   03/11/20 1300  TempSrc:   PainSc: 0-No pain      Patients Stated Pain Goal: 3 (07/37/10 6269)  Complications: No apparent anesthesia complications

## 2020-03-11 NOTE — Progress Notes (Signed)
Hovermatt placed on patient's bed.  Patient alert and oriented.  Waiting for surgeon.

## 2020-03-11 NOTE — Interval H&P Note (Signed)
History and Physical Interval Note:  03/11/2020 6:59 AM  [name]  has presented today for surgery, with the diagnosis of END STAGE RENAL DISEASE.  The various methods of treatment have been discussed with the patient and family. After consideration of risks, benefits and other options for treatment, the patient has consented to  Procedure(s): LEFT ARM ARTERIOVENOUS FISTULA  TRANSPOSITION (Left) as a surgical intervention.  The patient's history has been reviewed, patient examined, no change in status, stable for surgery.  I have reviewed the patient's chart and labs.  Questions were answered to the patient's satisfaction.     Curt Jews

## 2020-03-12 DIAGNOSIS — N2581 Secondary hyperparathyroidism of renal origin: Secondary | ICD-10-CM | POA: Diagnosis not present

## 2020-03-12 DIAGNOSIS — Z283 Underimmunization status: Secondary | ICD-10-CM | POA: Diagnosis not present

## 2020-03-12 DIAGNOSIS — D509 Iron deficiency anemia, unspecified: Secondary | ICD-10-CM | POA: Diagnosis not present

## 2020-03-12 DIAGNOSIS — D631 Anemia in chronic kidney disease: Secondary | ICD-10-CM | POA: Diagnosis not present

## 2020-03-12 DIAGNOSIS — D689 Coagulation defect, unspecified: Secondary | ICD-10-CM | POA: Diagnosis not present

## 2020-03-12 DIAGNOSIS — N186 End stage renal disease: Secondary | ICD-10-CM | POA: Diagnosis not present

## 2020-03-12 DIAGNOSIS — Z23 Encounter for immunization: Secondary | ICD-10-CM | POA: Diagnosis not present

## 2020-03-12 DIAGNOSIS — T8249XD Other complication of vascular dialysis catheter, subsequent encounter: Secondary | ICD-10-CM | POA: Diagnosis not present

## 2020-03-12 DIAGNOSIS — Z992 Dependence on renal dialysis: Secondary | ICD-10-CM | POA: Diagnosis not present

## 2020-03-15 DIAGNOSIS — D631 Anemia in chronic kidney disease: Secondary | ICD-10-CM | POA: Diagnosis not present

## 2020-03-15 DIAGNOSIS — D689 Coagulation defect, unspecified: Secondary | ICD-10-CM | POA: Diagnosis not present

## 2020-03-15 DIAGNOSIS — D509 Iron deficiency anemia, unspecified: Secondary | ICD-10-CM | POA: Diagnosis not present

## 2020-03-15 DIAGNOSIS — N186 End stage renal disease: Secondary | ICD-10-CM | POA: Diagnosis not present

## 2020-03-15 DIAGNOSIS — T8249XD Other complication of vascular dialysis catheter, subsequent encounter: Secondary | ICD-10-CM | POA: Diagnosis not present

## 2020-03-15 DIAGNOSIS — Z283 Underimmunization status: Secondary | ICD-10-CM | POA: Diagnosis not present

## 2020-03-15 DIAGNOSIS — Z992 Dependence on renal dialysis: Secondary | ICD-10-CM | POA: Diagnosis not present

## 2020-03-15 DIAGNOSIS — N2581 Secondary hyperparathyroidism of renal origin: Secondary | ICD-10-CM | POA: Diagnosis not present

## 2020-03-15 DIAGNOSIS — Z23 Encounter for immunization: Secondary | ICD-10-CM | POA: Diagnosis not present

## 2020-03-16 ENCOUNTER — Encounter: Payer: Self-pay | Admitting: *Deleted

## 2020-03-16 ENCOUNTER — Telehealth: Payer: Self-pay | Admitting: Vascular Surgery

## 2020-03-16 NOTE — Telephone Encounter (Signed)
The patient's wife called our office on the evening of 3/19, postop day 1.  She reported that she felt the patient was discharged from the recovery room to [name]..  Reported that he was incoherent and difficult the standing.  She had trouble getting him in the their home and he slept until 5 PM.  Fortunately has had no issues regarding his fistula revision.  Has some bruising and soreness which is resolving  I explained to the patient's wife that there are criteria as far as discharged from PACU.  I told her I would discuss his case with PACU nursing staff.  I called and spoke with the charge nurse this morning and gave the patient's information and asked that his case be looked into in follow-up to the wife given.  She was appreciative and I will follow up as planned in our office postoperatively

## 2020-03-16 NOTE — Anesthesia Postprocedure Evaluation (Signed)
Anesthesia Post Note  Patient: KEON PENDER  Procedure(s) Performed: LEFT ARM ARTERIOVENOUS FISTULA  TRANSPOSITION (Left )     Patient location during evaluation: PACU Anesthesia Type: General Level of consciousness: awake and alert Pain management: pain level controlled Vital Signs Assessment: post-procedure vital signs reviewed and stable Respiratory status: spontaneous breathing, nonlabored ventilation, respiratory function stable and patient connected to nasal cannula oxygen Cardiovascular status: blood pressure returned to baseline and stable Postop Assessment: no apparent nausea or vomiting Anesthetic complications: no    Last Vitals:  Vitals:   03/11/20 1300 03/11/20 1315  BP: (!) 141/87 (!) 141/99  Pulse: 74 75  Resp: 18 10  Temp: 36.4 C 36.7 C  SpO2: 99% 95%    Last Pain:  Vitals:   03/11/20 1300  TempSrc:   PainSc: 0-No pain   Pain Goal: Patients Stated Pain Goal: 3 (03/11/20 0730)                 Alfonse Garringer

## 2020-03-17 DIAGNOSIS — Z23 Encounter for immunization: Secondary | ICD-10-CM | POA: Diagnosis not present

## 2020-03-17 DIAGNOSIS — T8249XD Other complication of vascular dialysis catheter, subsequent encounter: Secondary | ICD-10-CM | POA: Diagnosis not present

## 2020-03-17 DIAGNOSIS — D689 Coagulation defect, unspecified: Secondary | ICD-10-CM | POA: Diagnosis not present

## 2020-03-17 DIAGNOSIS — Z283 Underimmunization status: Secondary | ICD-10-CM | POA: Diagnosis not present

## 2020-03-17 DIAGNOSIS — N186 End stage renal disease: Secondary | ICD-10-CM | POA: Diagnosis not present

## 2020-03-17 DIAGNOSIS — Z992 Dependence on renal dialysis: Secondary | ICD-10-CM | POA: Diagnosis not present

## 2020-03-17 DIAGNOSIS — D631 Anemia in chronic kidney disease: Secondary | ICD-10-CM | POA: Diagnosis not present

## 2020-03-17 DIAGNOSIS — N2581 Secondary hyperparathyroidism of renal origin: Secondary | ICD-10-CM | POA: Diagnosis not present

## 2020-03-17 DIAGNOSIS — D509 Iron deficiency anemia, unspecified: Secondary | ICD-10-CM | POA: Diagnosis not present

## 2020-03-19 DIAGNOSIS — Z283 Underimmunization status: Secondary | ICD-10-CM | POA: Diagnosis not present

## 2020-03-19 DIAGNOSIS — Z992 Dependence on renal dialysis: Secondary | ICD-10-CM | POA: Diagnosis not present

## 2020-03-19 DIAGNOSIS — T8249XD Other complication of vascular dialysis catheter, subsequent encounter: Secondary | ICD-10-CM | POA: Diagnosis not present

## 2020-03-19 DIAGNOSIS — N186 End stage renal disease: Secondary | ICD-10-CM | POA: Diagnosis not present

## 2020-03-19 DIAGNOSIS — Z23 Encounter for immunization: Secondary | ICD-10-CM | POA: Diagnosis not present

## 2020-03-19 DIAGNOSIS — N2581 Secondary hyperparathyroidism of renal origin: Secondary | ICD-10-CM | POA: Diagnosis not present

## 2020-03-19 DIAGNOSIS — D689 Coagulation defect, unspecified: Secondary | ICD-10-CM | POA: Diagnosis not present

## 2020-03-19 DIAGNOSIS — D509 Iron deficiency anemia, unspecified: Secondary | ICD-10-CM | POA: Diagnosis not present

## 2020-03-19 DIAGNOSIS — D631 Anemia in chronic kidney disease: Secondary | ICD-10-CM | POA: Diagnosis not present

## 2020-03-22 DIAGNOSIS — D509 Iron deficiency anemia, unspecified: Secondary | ICD-10-CM | POA: Diagnosis not present

## 2020-03-22 DIAGNOSIS — Z23 Encounter for immunization: Secondary | ICD-10-CM | POA: Diagnosis not present

## 2020-03-22 DIAGNOSIS — Z283 Underimmunization status: Secondary | ICD-10-CM | POA: Diagnosis not present

## 2020-03-22 DIAGNOSIS — Z992 Dependence on renal dialysis: Secondary | ICD-10-CM | POA: Diagnosis not present

## 2020-03-22 DIAGNOSIS — D631 Anemia in chronic kidney disease: Secondary | ICD-10-CM | POA: Diagnosis not present

## 2020-03-22 DIAGNOSIS — D689 Coagulation defect, unspecified: Secondary | ICD-10-CM | POA: Diagnosis not present

## 2020-03-22 DIAGNOSIS — N186 End stage renal disease: Secondary | ICD-10-CM | POA: Diagnosis not present

## 2020-03-22 DIAGNOSIS — T8249XD Other complication of vascular dialysis catheter, subsequent encounter: Secondary | ICD-10-CM | POA: Diagnosis not present

## 2020-03-22 DIAGNOSIS — N2581 Secondary hyperparathyroidism of renal origin: Secondary | ICD-10-CM | POA: Diagnosis not present

## 2020-03-23 ENCOUNTER — Telehealth: Payer: Self-pay | Admitting: Family Medicine

## 2020-03-23 MED ORDER — SERTRALINE HCL 100 MG PO TABS: 100.0000 mg | ORAL_TABLET | Freq: Every day | ORAL | 11 refills | Status: DC

## 2020-03-23 NOTE — Telephone Encounter (Signed)
His anxiety level has gone up due to the increased medical issues he has been dealing with (like dialysis), so we will in crease the Sertraline to 100 mg daily.

## 2020-03-24 DIAGNOSIS — Z283 Underimmunization status: Secondary | ICD-10-CM | POA: Diagnosis not present

## 2020-03-24 DIAGNOSIS — D631 Anemia in chronic kidney disease: Secondary | ICD-10-CM | POA: Diagnosis not present

## 2020-03-24 DIAGNOSIS — I129 Hypertensive chronic kidney disease with stage 1 through stage 4 chronic kidney disease, or unspecified chronic kidney disease: Secondary | ICD-10-CM | POA: Diagnosis not present

## 2020-03-24 DIAGNOSIS — Z23 Encounter for immunization: Secondary | ICD-10-CM | POA: Diagnosis not present

## 2020-03-24 DIAGNOSIS — T8249XD Other complication of vascular dialysis catheter, subsequent encounter: Secondary | ICD-10-CM | POA: Diagnosis not present

## 2020-03-24 DIAGNOSIS — N2581 Secondary hyperparathyroidism of renal origin: Secondary | ICD-10-CM | POA: Diagnosis not present

## 2020-03-24 DIAGNOSIS — D509 Iron deficiency anemia, unspecified: Secondary | ICD-10-CM | POA: Diagnosis not present

## 2020-03-24 DIAGNOSIS — Z992 Dependence on renal dialysis: Secondary | ICD-10-CM | POA: Diagnosis not present

## 2020-03-24 DIAGNOSIS — N186 End stage renal disease: Secondary | ICD-10-CM | POA: Diagnosis not present

## 2020-03-24 DIAGNOSIS — D689 Coagulation defect, unspecified: Secondary | ICD-10-CM | POA: Diagnosis not present

## 2020-03-26 DIAGNOSIS — D509 Iron deficiency anemia, unspecified: Secondary | ICD-10-CM | POA: Diagnosis not present

## 2020-03-26 DIAGNOSIS — E785 Hyperlipidemia, unspecified: Secondary | ICD-10-CM | POA: Diagnosis not present

## 2020-03-26 DIAGNOSIS — D631 Anemia in chronic kidney disease: Secondary | ICD-10-CM | POA: Diagnosis not present

## 2020-03-26 DIAGNOSIS — Z992 Dependence on renal dialysis: Secondary | ICD-10-CM | POA: Diagnosis not present

## 2020-03-26 DIAGNOSIS — Z23 Encounter for immunization: Secondary | ICD-10-CM | POA: Diagnosis not present

## 2020-03-26 DIAGNOSIS — N2581 Secondary hyperparathyroidism of renal origin: Secondary | ICD-10-CM | POA: Diagnosis not present

## 2020-03-26 DIAGNOSIS — Z283 Underimmunization status: Secondary | ICD-10-CM | POA: Diagnosis not present

## 2020-03-26 DIAGNOSIS — T8249XD Other complication of vascular dialysis catheter, subsequent encounter: Secondary | ICD-10-CM | POA: Diagnosis not present

## 2020-03-26 DIAGNOSIS — N186 End stage renal disease: Secondary | ICD-10-CM | POA: Diagnosis not present

## 2020-03-26 DIAGNOSIS — D689 Coagulation defect, unspecified: Secondary | ICD-10-CM | POA: Diagnosis not present

## 2020-03-29 DIAGNOSIS — E785 Hyperlipidemia, unspecified: Secondary | ICD-10-CM | POA: Diagnosis not present

## 2020-03-29 DIAGNOSIS — N2581 Secondary hyperparathyroidism of renal origin: Secondary | ICD-10-CM | POA: Diagnosis not present

## 2020-03-29 DIAGNOSIS — Z992 Dependence on renal dialysis: Secondary | ICD-10-CM | POA: Diagnosis not present

## 2020-03-29 DIAGNOSIS — Z283 Underimmunization status: Secondary | ICD-10-CM | POA: Diagnosis not present

## 2020-03-29 DIAGNOSIS — D509 Iron deficiency anemia, unspecified: Secondary | ICD-10-CM | POA: Diagnosis not present

## 2020-03-29 DIAGNOSIS — D689 Coagulation defect, unspecified: Secondary | ICD-10-CM | POA: Diagnosis not present

## 2020-03-29 DIAGNOSIS — Z23 Encounter for immunization: Secondary | ICD-10-CM | POA: Diagnosis not present

## 2020-03-29 DIAGNOSIS — D631 Anemia in chronic kidney disease: Secondary | ICD-10-CM | POA: Diagnosis not present

## 2020-03-29 DIAGNOSIS — T8249XD Other complication of vascular dialysis catheter, subsequent encounter: Secondary | ICD-10-CM | POA: Diagnosis not present

## 2020-03-29 DIAGNOSIS — N186 End stage renal disease: Secondary | ICD-10-CM | POA: Diagnosis not present

## 2020-03-31 DIAGNOSIS — D689 Coagulation defect, unspecified: Secondary | ICD-10-CM | POA: Diagnosis not present

## 2020-03-31 DIAGNOSIS — Z23 Encounter for immunization: Secondary | ICD-10-CM | POA: Diagnosis not present

## 2020-03-31 DIAGNOSIS — Z283 Underimmunization status: Secondary | ICD-10-CM | POA: Diagnosis not present

## 2020-03-31 DIAGNOSIS — Z992 Dependence on renal dialysis: Secondary | ICD-10-CM | POA: Diagnosis not present

## 2020-03-31 DIAGNOSIS — T8249XD Other complication of vascular dialysis catheter, subsequent encounter: Secondary | ICD-10-CM | POA: Diagnosis not present

## 2020-03-31 DIAGNOSIS — N186 End stage renal disease: Secondary | ICD-10-CM | POA: Diagnosis not present

## 2020-03-31 DIAGNOSIS — E785 Hyperlipidemia, unspecified: Secondary | ICD-10-CM | POA: Diagnosis not present

## 2020-03-31 DIAGNOSIS — N2581 Secondary hyperparathyroidism of renal origin: Secondary | ICD-10-CM | POA: Diagnosis not present

## 2020-03-31 DIAGNOSIS — D631 Anemia in chronic kidney disease: Secondary | ICD-10-CM | POA: Diagnosis not present

## 2020-03-31 DIAGNOSIS — D509 Iron deficiency anemia, unspecified: Secondary | ICD-10-CM | POA: Diagnosis not present

## 2020-04-01 ENCOUNTER — Other Ambulatory Visit: Payer: Self-pay | Admitting: *Deleted

## 2020-04-01 DIAGNOSIS — Z992 Dependence on renal dialysis: Secondary | ICD-10-CM

## 2020-04-01 DIAGNOSIS — N186 End stage renal disease: Secondary | ICD-10-CM

## 2020-04-02 DIAGNOSIS — T8249XD Other complication of vascular dialysis catheter, subsequent encounter: Secondary | ICD-10-CM | POA: Diagnosis not present

## 2020-04-02 DIAGNOSIS — Z992 Dependence on renal dialysis: Secondary | ICD-10-CM | POA: Diagnosis not present

## 2020-04-02 DIAGNOSIS — N186 End stage renal disease: Secondary | ICD-10-CM | POA: Diagnosis not present

## 2020-04-02 DIAGNOSIS — E785 Hyperlipidemia, unspecified: Secondary | ICD-10-CM | POA: Diagnosis not present

## 2020-04-02 DIAGNOSIS — D631 Anemia in chronic kidney disease: Secondary | ICD-10-CM | POA: Diagnosis not present

## 2020-04-02 DIAGNOSIS — N2581 Secondary hyperparathyroidism of renal origin: Secondary | ICD-10-CM | POA: Diagnosis not present

## 2020-04-02 DIAGNOSIS — D509 Iron deficiency anemia, unspecified: Secondary | ICD-10-CM | POA: Diagnosis not present

## 2020-04-02 DIAGNOSIS — Z23 Encounter for immunization: Secondary | ICD-10-CM | POA: Diagnosis not present

## 2020-04-02 DIAGNOSIS — D689 Coagulation defect, unspecified: Secondary | ICD-10-CM | POA: Diagnosis not present

## 2020-04-02 DIAGNOSIS — Z283 Underimmunization status: Secondary | ICD-10-CM | POA: Diagnosis not present

## 2020-04-05 DIAGNOSIS — Z283 Underimmunization status: Secondary | ICD-10-CM | POA: Diagnosis not present

## 2020-04-05 DIAGNOSIS — D689 Coagulation defect, unspecified: Secondary | ICD-10-CM | POA: Diagnosis not present

## 2020-04-05 DIAGNOSIS — D509 Iron deficiency anemia, unspecified: Secondary | ICD-10-CM | POA: Diagnosis not present

## 2020-04-05 DIAGNOSIS — Z23 Encounter for immunization: Secondary | ICD-10-CM | POA: Diagnosis not present

## 2020-04-05 DIAGNOSIS — N2581 Secondary hyperparathyroidism of renal origin: Secondary | ICD-10-CM | POA: Diagnosis not present

## 2020-04-05 DIAGNOSIS — D631 Anemia in chronic kidney disease: Secondary | ICD-10-CM | POA: Diagnosis not present

## 2020-04-05 DIAGNOSIS — N186 End stage renal disease: Secondary | ICD-10-CM | POA: Diagnosis not present

## 2020-04-05 DIAGNOSIS — Z992 Dependence on renal dialysis: Secondary | ICD-10-CM | POA: Diagnosis not present

## 2020-04-05 DIAGNOSIS — T8249XD Other complication of vascular dialysis catheter, subsequent encounter: Secondary | ICD-10-CM | POA: Diagnosis not present

## 2020-04-05 DIAGNOSIS — E785 Hyperlipidemia, unspecified: Secondary | ICD-10-CM | POA: Diagnosis not present

## 2020-04-06 ENCOUNTER — Ambulatory Visit (INDEPENDENT_AMBULATORY_CARE_PROVIDER_SITE_OTHER): Payer: Self-pay | Admitting: Physician Assistant

## 2020-04-06 ENCOUNTER — Other Ambulatory Visit: Payer: Self-pay

## 2020-04-06 ENCOUNTER — Ambulatory Visit (HOSPITAL_COMMUNITY)
Admission: RE | Admit: 2020-04-06 | Discharge: 2020-04-06 | Disposition: A | Discharge status: Home / Self Care | Payer: Medicare Other | Source: Ambulatory Visit | Attending: Vascular Surgery | Admitting: Vascular Surgery

## 2020-04-06 VITALS — BP 116/73 | HR 76 | Temp 97.8°F | Resp 16 | Ht 72.0 in | Wt 331.6 lb

## 2020-04-06 DIAGNOSIS — N186 End stage renal disease: Secondary | ICD-10-CM

## 2020-04-06 DIAGNOSIS — Z992 Dependence on renal dialysis: Secondary | ICD-10-CM | POA: Diagnosis not present

## 2020-04-06 NOTE — Progress Notes (Signed)
POST OPERATIVE OFFICE NOTE    CC:  F/u for surgery  HPI:  This is a 49 y.o. male who is s/p Ligation of left radiocephalic fistula.  Translocation of left upper arm cephalic vein with brachiocephalic fistula on 7/90/2409 by Dr. Donnetta Hutching and presents today for follow up.  His TDC was placed at [facility].   Pt has hx of EVAR for AAA in 2013 by Dr. Trula Slade and Dr. Kellie Simmering.  He had a CT of the abdomen/pelvis in November 2018 that showed stable appearance of aortoiliac stent graft with stable size of the native aortic aneurysm and no acute abdominal or pelvic findings.  He has not had any follow up studies.  He last saw Dr. Trula Slade for AAA in November 2018 and at that time, he did not have any issues.   He underwent aortic arch and valve surgery at [facility] in early 2018.   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  The pt does not have evidence of steal sx & denies pain or numbness in his hand.  He does have some shooting pain down his forearm occasionally. The pt is on dialysis M/W/F at the [facility] location.   His wife Levada Dy was present for the visit via telephone.   Allergies  Allergen Reactions  . Oxycodone Hives    Current Outpatient Medications  Medication Sig Dispense Refill  . acetaminophen (TYLENOL) 500 MG tablet Take 1,000 mg by mouth every 6 (six) hours as needed for moderate pain or headache. Reported on 12/14/2015    . amLODipine (NORVASC) 10 MG tablet Take 10 mg by mouth at bedtime.    [name] apixaban (ELIQUIS) 2.5 MG TABS tablet Take 1 tablet (2.5 mg total) by mouth 2 (two) times daily. 180 tablet 3  . aspirin 81 MG EC tablet Take 81 mg by mouth 2 (two) times daily.     [name] atorvastatin (LIPITOR) 10 MG tablet Take 1 tablet (10 mg total) by mouth daily. Please make annual appt with Dr. Sallyanne Kuster in April for refills.Thank you. (Patient taking differently: Take 10 mg by mouth at bedtime. Please make annual appt with Dr. Sallyanne Kuster in April for refills.Thank you.) 90  tablet 0  . B Complex-C-Folic Acid (DIALYVITE 735) 0.8 MG TABS Take 1 tablet by mouth daily.    . carvedilol (COREG) 25 MG tablet Take 1 tablet (25 mg total) by mouth 2 (two) times daily. 180 tablet 3  . Methoxy PEG-Epoetin Beta (MIRCERA IJ) Inject as directed as needed.     . sertraline (ZOLOFT) 100 MG tablet Take 1 tablet (100 mg total) by mouth daily. 30 tablet 11  . temazepam (RESTORIL) 30 MG capsule Take 1 capsule (30 mg total) by mouth at bedtime as needed for sleep. (Patient taking differently: Take 30 mg by mouth at bedtime. ) 30 capsule 3  . traMADol (ULTRAM) 50 MG tablet Take 1 tablet (50 mg total) by mouth every 6 (six) hours as needed. 20 tablet 0   No current facility-administered medications for this visit.     ROS:  See HPI  Physical Exam:  Today's Vitals   04/06/20 1312  BP: 116/73  Pulse: 76  Resp: 16  Temp: 97.8 F (36.6 C)  SpO2: 95%  Weight: (!) 331 lb 9.6 oz (150.4 kg)  Height: 6' (1.829 m)   Body mass index is 44.97 kg/m.   Incision:  Well healed-the distal incision has small keloid.   Extremities:  There is a palpable left radial  pulse.  Motor and sensory are in tact.  There is a thrill/bruit present.      Assessment/Plan:  This is a 49 y.o. male who is s/p: Ligation of left radiocephalic fistula.  Translocation of left upper arm cephalic vein with brachiocephalic fistula on 7/35/6701 by Dr. Donnetta Hutching   ESRD -the pt does not have evidence of steal. -the fistula/graft can be used May 11, 2020. -If pt has tunneled dialysis catheter and the access has been used successfully to the satisfaction of the dialysis center, the tunneled catheter can be removed at their discretion.   -the pt will follow up as needed for HD access  AAA -hx of EVAR in 2013 by Dr. Kellie Simmering.  Pt last had CT scan in 2018, which revealed his AAA and stent graft were stable.  He has not had follow up for that since then.  Discussed with Dr. Carlis Abbott and will have pt return in the next month  for an EVAR duplex and see Dr. Trula Slade.  If unable to see with u/s, may need CTA and I did discuss this with the pt.  Pt knows to call 911 should he develop severe sudden onset of abdominal or back pain.   (He is followed at [facility] for his aortic arch/valve surgery)   Leontine Locket, [facility] [phone]  Clinic MD:  Carlis Abbott

## 2020-04-07 ENCOUNTER — Ambulatory Visit (INDEPENDENT_AMBULATORY_CARE_PROVIDER_SITE_OTHER): Payer: Medicare Other | Admitting: Cardiovascular Disease

## 2020-04-07 ENCOUNTER — Encounter: Payer: Self-pay | Admitting: Cardiovascular Disease

## 2020-04-07 ENCOUNTER — Other Ambulatory Visit: Payer: Self-pay | Admitting: *Deleted

## 2020-04-07 VITALS — BP 120/80 | HR 82 | Ht 72.0 in | Wt 332.0 lb

## 2020-04-07 DIAGNOSIS — E785 Hyperlipidemia, unspecified: Secondary | ICD-10-CM | POA: Diagnosis not present

## 2020-04-07 DIAGNOSIS — D509 Iron deficiency anemia, unspecified: Secondary | ICD-10-CM | POA: Diagnosis not present

## 2020-04-07 DIAGNOSIS — I714 Abdominal aortic aneurysm, without rupture, unspecified: Secondary | ICD-10-CM

## 2020-04-07 DIAGNOSIS — Z9889 Other specified postprocedural states: Secondary | ICD-10-CM

## 2020-04-07 DIAGNOSIS — I10 Essential (primary) hypertension: Secondary | ICD-10-CM

## 2020-04-07 DIAGNOSIS — Z86718 Personal history of other venous thrombosis and embolism: Secondary | ICD-10-CM

## 2020-04-07 DIAGNOSIS — I5032 Chronic diastolic (congestive) heart failure: Secondary | ICD-10-CM

## 2020-04-07 DIAGNOSIS — I723 Aneurysm of iliac artery: Secondary | ICD-10-CM | POA: Diagnosis not present

## 2020-04-07 DIAGNOSIS — N186 End stage renal disease: Secondary | ICD-10-CM | POA: Diagnosis not present

## 2020-04-07 DIAGNOSIS — N2581 Secondary hyperparathyroidism of renal origin: Secondary | ICD-10-CM | POA: Diagnosis not present

## 2020-04-07 DIAGNOSIS — T8249XD Other complication of vascular dialysis catheter, subsequent encounter: Secondary | ICD-10-CM | POA: Diagnosis not present

## 2020-04-07 DIAGNOSIS — D689 Coagulation defect, unspecified: Secondary | ICD-10-CM | POA: Diagnosis not present

## 2020-04-07 DIAGNOSIS — Z283 Underimmunization status: Secondary | ICD-10-CM | POA: Diagnosis not present

## 2020-04-07 DIAGNOSIS — D631 Anemia in chronic kidney disease: Secondary | ICD-10-CM | POA: Diagnosis not present

## 2020-04-07 DIAGNOSIS — Z23 Encounter for immunization: Secondary | ICD-10-CM | POA: Diagnosis not present

## 2020-04-07 DIAGNOSIS — E7800 Pure hypercholesterolemia, unspecified: Secondary | ICD-10-CM

## 2020-04-07 DIAGNOSIS — Z992 Dependence on renal dialysis: Secondary | ICD-10-CM | POA: Diagnosis not present

## 2020-04-07 DIAGNOSIS — Z8679 Personal history of other diseases of the circulatory system: Secondary | ICD-10-CM

## 2020-04-07 NOTE — Progress Notes (Signed)
Cardiology office note     Evaluation Performed:  Follow-up visit  Date:  04/07/2020   ID:  [name], DOB 1971-02-28, MRN [id number]   PCP:  Laurey Morale, MD  Cardiologist:  Krist Rosenboom Electrophysiologist:  None   Chief Complaint:  Aortic valve repair f/u, chronic diastolic CHF f/u  History of Present Illness:    [name] is a 49 y.o. male with a history of obesity, severe systemic hypertension, aneurysm of the ascending aorta s/p Yacoub valve sparing root repair and 63mm HAART300 aortic annuloplasty(Dr. Ysidro Evert, [facility], 2018), , history of stent graft repair for abdominal aortic aneurysm and dissection (Dr. Kellie Simmering, 2013), bilateral iliac artery aneurysms, hypertensive heart disease with severe left ventricular hypertrophy and well compensated diastolic heart failure, mild to moderate aortic insufficiency, history of DVT and pulmonary embolism on chronic anticoagulation (history of bleeding problems on Xarelto, switched to low-dose Eliquis), ESRD (Dr. [name], dialysis center Dr. Hollie Salk), large left forearm AV fistula ligated with a new left upper arm fistula.  He started hemodialysis Monday Wednesday Friday in January.  He was having extreme fatigue before that and he is now "80% better".  He does not have issues of dyspnea and has only minimal ankle edema.  His dry weight has been established at 150 kg.  Had some issues with low potassium and his diet is being adjusted.  Still makes urine about twice a day.  Denies chest pain, palpitations, syncope.  Had some dizziness 1 day when ultrafiltration was exceeded beyond his dry weight.[name]  He is no longer taking oral anticoagulants.  Xarelto caused recurrent spontaneous bleeding.  Eliquis was well-tolerated but was unaffordable, although we appealed to the manufacturer for patient assistance and appealed to his insurance company for tier exception.  He is currently taking aspirin for DVT prophylaxis.  He has not had unilateral  leg swelling, tenderness, cough or hemoptysis.  Blood pressure is not very well controlled on amlodipine and carvedilol.  Follow-up cardiac and aortic MRI performed in October if still shows severe left ventricular hypertrophy with preserved left ventricular systolic function and intact aortic valve repair without regurgitation.  To transaortic flow velocity has increased further from 2.9 m/seconds to 3.5 m/second.  He has not had an echocardiogram.  There is no evidence of hemochromatosis.  There was suspicion for amyloidosis but gadolinium could not be administered.  The aortic repair is intact.  The patient does not have symptoms concerning for COVID-19 infection (fever, chills, cough, or new shortness of breath).  He is very reluctant to have the vaccine.   Past Medical History:  Diagnosis Date  . Abdominal aortic aneurysm ([place])   . Anemia   . Anxiety   . Aortic regurgitation 12/10/08   Echo: mild to mod. AOV regurg,mild AO root dilatation, LA mildly dilated, EF 65%, LV wall thickness markedly increased, small PE  . Chronic kidney disease   . Chronic renal disease    advanced  . Complication of anesthesia    slow to awaken 1 time  . DVT (deep venous thrombosis) ([place])   . H/O aortic dissection 2009   infarenal abdominal aortic dissection  . History of pulmonary embolus (PE) 2010  . Hypertension    dr [name]  . Left ventricular hypertrophy   . Obesity   . Stroke [facility]) 2009   pt says no stroke   Past Surgical History:  Procedure Laterality Date  . ABDOMINAL AORTAGRAM N/A 02/06/2012   Procedure: ABDOMINAL Maxcine Ham;  Surgeon: Butch Penny  Trula Slade, MD;  Location: [place] CATH LAB;  Service: Cardiovascular;  Laterality: N/A;  . ABDOMINAL AORTIC ANEURYSM REPAIR  09/25/2012   EVAR  . AORTIC ROOT REPLACEMENT  05/07/2017   s/p valve sparing root replacement with coronary reconstruction along with repair of aortic valve with a 21MM HAART 300 aortic ring annuloplasty at [facility]  . AV FISTULA  PLACEMENT Left 05/19/2015   Procedure: ARTERIOVENOUS (AV) FISTULA CREATION-LEFT RADIOCEPHALIC;  Surgeon: Mal [name], MD;  Location: [place];  Service: Vascular;  Laterality: Left;  . BASCILIC VEIN TRANSPOSITION Left 03/11/2020   Procedure: LEFT ARM ARTERIOVENOUS FISTULA  TRANSPOSITION;  Surgeon: [name], MD;  Location: [place];  Service: Vascular;  Laterality: Left;  . CARDIAC CATHETERIZATION  02/11/10   false + Nuc  . CORNEAL TRANSPLANT Left 1997  . FISTULA SUPERFICIALIZATION Left 08/04/2015   Procedure: FISTULA SUPERFICIALIZATION LEFT RADIAL CEPHALIC;  Surgeon: Mal [name], MD;  Location: [place];  Service: Vascular;  Laterality: Left;  . INTRAVASCULAR ULTRASOUND  09/25/2012   Procedure: INTRAVASCULAR ULTRASOUND;  Surgeon: Mal [name], MD;  Location: [place];  Service: Vascular;  Laterality: N/A;  . LIGATION OF COMPETING BRANCHES OF ARTERIOVENOUS FISTULA Left 08/04/2015   Procedure: LIGATION OF MULTIPLE COMPETING BRANCHES OF ARTERIOVENOUS FISTULA;  Surgeon: Mal [name], MD;  Location: [place];  Service: Vascular;  Laterality: Left;  . RESECTION OF ARTERIOVENOUS FISTULA ANEURYSM Left 01/02/2018   Procedure: RESECTION OF ARTERIOVENOUS FISTULA ANEURYSM LEFT RADIOCEPHALIC;  Surgeon: [name], MD;  Location: [facility];  Service: Cardiovascular;  Laterality: Left;     Current Meds  Medication Sig  . acetaminophen (TYLENOL) 500 MG tablet Take 1,000 mg by mouth every 6 (six) hours as needed for moderate pain or headache. Reported on 12/14/2015  . amLODipine (NORVASC) 10 MG tablet Take 10 mg by mouth at bedtime.  [name] aspirin 81 MG EC tablet Take 81 mg by mouth 2 (two) times daily.   [name] atorvastatin (LIPITOR) 10 MG tablet Take 1 tablet (10 mg total) by mouth daily. Please make annual appt with Dr. Sallyanne Kuster in April for refills.Thank you. (Patient taking differently: Take 10 mg by mouth at bedtime. Please make annual appt with Dr. Sallyanne Kuster in April for refills.Thank you.)  . B Complex-C-Folic Acid  (DIALYVITE 800) 0.8 MG TABS Take 1 tablet by mouth daily.  . carvedilol (COREG) 25 MG tablet Take 1 tablet (25 mg total) by mouth 2 (two) times daily.  . sertraline (ZOLOFT) 100 MG tablet Take 1 tablet (100 mg total) by mouth daily.  . temazepam (RESTORIL) 30 MG capsule Take 1 capsule (30 mg total) by mouth at bedtime as needed for sleep. (Patient taking differently: Take 30 mg by mouth at bedtime. )     Allergies:   Oxycodone   Social History   Tobacco Use  . Smoking status: Former Smoker    Years: 3.00    Types: Cigarettes    Quit date: 01/25/1991    Years since quitting: 29.2  . Smokeless tobacco: Never Used  Substance Use Topics  . Alcohol use: No    Alcohol/week: 0.0 standard drinks  . Drug use: No     Family Hx: The patient's family history includes Hypertension in his father and mother.  ROS:   Please see the history of present illness.     All other systems reviewed and are negative.   Prior CV studies:   The following studies were reviewed today: Notes from his follow-up visit with Dr. Ysidro Evert  May 2019, CT of the aorta with stable aortic repair same date  Labs/Other Tests and Data Reviewed:    Cardiac/aortic MRI 10/17/2019: Cardiac MRI:  1. The left ventricle is normal in cavity size. There is concentric severe left ventricular hypertrophy with a septal wall thickness of 2.1 cm (prior 2.0 cm) with a calculated myocardial mass of 325g (prior 328g). Global systolic function is hyperdynamic. The LV ejection fraction is 80% (prior 81%). There are no regional wall motion abnormalities.  2. The right ventricle is normal in cavity size, wall thickness, and systolic function.  3. Mildly enlarged left atrium.  Normal sized right atrium  4. The aortic valve is trileaflet (s/p annuloplasty) in morphology and function. The peak aortic valve 3.5 m/s (prior 2.9 m/s). There is no significant valvular disease.  5. T2* was 36 ms (prior 42 ms), indicating the patient does not  have iron overload.  6. Native T1 is 1482 ms (prior 1421 ms) (upper limit of normal is 1284 ms);  these findings could suggest cardiac amyloidosis. However, without gadolinium administration this finding cannot be confirmed.   Chest MRA  1. Postsurgical changes of aortic root replacement without evidence of complication.  There has been reimplantation of the coronary arteritis.    2. The aortic arch is left sided. There is normal branching of the arch vessels.  3. The main pulmonary artery is enlarged, which can suggest pulmonary hypertension   4. Trace bilateral pleural effusions are present  5. Cholelithiasis, incompletely evaluated.   Bi-orthogonal luminal aortic dimensions with prior measurements below:  Sinus of Valsalva (largest dimension, cross-sectional dimension): 4.7 x 3.6 cm (previously 4.7 x 3.5 cm) Sinotubular junction: 2.7 x 2.5 cm (previously 2.7 x 2.5 cm) Mid-ascending aorta: 3.2 x 3.2 cm (previously 3.2 x 3.2 cm) Proximal aortic arch (aorta at the origin of the innominate artery): 3.0 x 3.1 cm (previously 3.3 x 3.2 cm) Mid-aortic arch (between left common carotid and subclavian arteries): 2.9 x 2.9 cm (previously 3.2 x 3.1 cm) Proximal descending thoracic aorta: 2.3 x 2.4 cm (previously 2.7 x 2.6 cm) Mid-descending aorta: 2.5 x 2.5 cm (previously 2.7 x 2.7 cm)   EKG:  An ECG dated today was personally reviewed today and demonstrated:  NSR, LA abnormality, incomplete LBBB (QRS 110 ms), prominent secondary ST-T changes inferolaterally, QTc 504 ms.  Recent Labs: 03/11/2020: BUN 15; Creatinine, Ser 5.10; Hemoglobin 11.6; Potassium 2.9; Sodium 139   Recent Lipid Panel Lab Results  Component Value Date/Time   CHOL 140 02/29/2012 08:29 AM   TRIG 72 02/29/2012 08:29 AM   HDL 42 02/29/2012 08:29 AM   CHOLHDL 3.3 02/29/2012 08:29 AM   LDLCALC 84 02/29/2012 08:29 AM    Wt Readings from Last 3 Encounters:  04/07/20 (!) 332 lb (150.6 kg)  04/06/20 (!) 331 lb 9.6 oz  (150.4 kg)  03/11/20 (!) 338 lb (153.3 kg)     Objective:    Vital Signs:  BP 120/80   Pulse 82   Ht 6' (1.829 m)   Wt (!) 332 lb (150.6 kg)   BMI 45.03 kg/m     General: Alert, oriented x3, no distress, morbid obesity Head: no evidence of trauma, PERRL, EOMI, no exophtalmos or lid lag, no myxedema, no xanthelasma; normal ears, nose and oropharynx Neck: normal jugular venous pulsations and no hepatojugular reflux; brisk carotid pulses without delay and no carotid bruits Chest: clear to auscultation, no signs of consolidation by percussion or palpation, normal fremitus, symmetrical and full respiratory excursions.  Healthy right dialysis catheter site Cardiovascular: normal position and quality of the apical impulse, regular rhythm, normal first and second heart sounds, no rubs or gallops, the continuous bruit from the AV fistula is radiating throughout the chest and makes it hard to pick out any diastolic murmurs. Abdomen: no tenderness or distention, no masses by palpation, no abnormal pulsatility or arterial bruits, normal bowel sounds, no hepatosplenomegaly Extremities: no clubbing, cyanosis.  1+ symmetrical ankle edema.  Aneurysmal left forearm AV fistula without thrill or bruit, excellent thrill and bruit overlying the left upper arm AV fistula with well-healing scars Neurological: grossly nonfocal Psych: Normal mood and affect   ASSESSMENT & PLAN:    1. CHF: Chronic diastolic heart failure due to severe left ventricular hypertrophy, at least partly due to longstanding hypertension that was insufficiently treated.  His MRI findings do raise concern for possible cardiac amyloidosis (gadolinium could not be administered), although the course of his illness has been very benign compared to the typical course of the most common cases of hereditary or wild-type TTR amyloidosis.  We will schedule for a pyrophosphate scan and a repeat echocardiogram.  Currently appears to be euvolemic at the  established "dry weight" of 150 kg.  2. ESRD: On hemodialysis Monday Wednesday Friday, currently through a right subclavian catheter until his left upper arm AV fistula is fully mature.  Still makes urine approximately twice a day.  Fatigue has improved "80%" since starting hemodialysis.   3. Long QT: reports having some issues with low potassium, with subsequent adjustments in diet.  Today's ECG shows a moderately prolonged QT interval that may reflect a low potassium level.  Avoid additional QT prolonging drugs. 4. HTN: Excellent control.. 5. Asc Ao aneurysm: s/p repair 2018.  Good findings at cardiac MRI in October 2020, at [facility]. 6. AI: s/p aortic valve annuloplasty repair with a 21 mm HAART 300 ring.  Mild iatrogenic aortic stenosis.  MRI reports aortic velocity has increased from 2.9-3.5 but also reports "no aortic valve disease".  Suspect this is due to increased cardiac output from his AV fistula.  Recheck echocardiogram. 7. AAA:  History of graft repair, monitored by Dr. Trula Slade.  Unable to administer contrast for follow-up has been with CT without contrast or ultrasound. 8. Iliac aneurysm: as above 9. DVT was complicated by pulmonary embolism.   Currently just on aspirin for prophylaxis.  Unable to afford DOAC.  No bleeding problems on the Eliquis (which did occur with Xarelto). Has been denied patient assistance and tier exception.  Currently on aspirin for DVT prophylaxis 10. Morbid obesity:  Congratulated him on losing about 50 pounds.  Keep on trying to gradually lose weight. 11. HLP: Check lipids at the dialysis center when he next has labs.   COVID-19 Education: The signs and symptoms of COVID-19 were discussed with the patient and how to seek care for testing (follow up with PCP or arrange E-visit).  The importance of social distancing was discussed today.  Time:   Today, I have spent 31 minutes with the patient with telehealth technology discussing the above problems.      Medication Adjustments/Labs and Tests Ordered: Current medicines are reviewed at length with the patient today.  Concerns regarding medicines are outlined above.   Tests Ordered: Orders Placed This Encounter  Procedures  . Lipid panel  . MYOCARDIAL AMYLOID IMAGING PLANAR AND SPECT  . EKG 12-Lead  . ECHOCARDIOGRAM COMPLETE    Medication Changes: No orders of the defined types were placed in  this encounter.   Disposition:  Follow up 12 months  Signed, Sanda Klein, MD  04/07/2020 9:01 AM    [place]

## 2020-04-07 NOTE — Patient Instructions (Signed)
Medication Instructions:  No changes *If you need a refill on your cardiac medications before your next appointment, please call your pharmacy*   Lab Work: Your provider would like for you to have the following labs: fasting Lipid when you have dialysis   If you have labs (blood work) drawn today and your tests are completely normal, you will receive your results only by: [name] MyChart Message (if you have MyChart) OR . A paper copy in the mail If you have any lab test that is abnormal or we need to change your treatment, we will call you to review the results.   Testing/Procedures: Myocardial Amyloid Planar and Spect Scan - Your physician has requested that you have a scan to evaluate for cardiac amyloidosis. This test "outlines" the heart to determine if you have any amyloid protein deposits located in your heart. This will be performed here at our [facility] location. The test will take approximately 2 hours to complete. Please do not wear any cologne or fragrances.   Your physician has requested that you have an echocardiogram. Echocardiography is a painless test that uses sound waves to create images of your heart. It provides your doctor with information about the size and shape of your heart and how well your heart's chambers and valves are working. You may receive an ultrasound enhancing agent through an IV if needed to better visualize your heart during the echo.This procedure takes approximately one hour. There are no restrictions for this procedure. This will take place at the [address], [address].    Follow-Up: At [facility], you and your health needs are our priority.  As part of our continuing mission to provide you with exceptional heart care, we have created designated Provider Care Teams.  These Care Teams include your primary Cardiologist (physician) and Advanced Practice Providers (APPs -  Physician Assistants and Nurse Practitioners) who all work together to provide  you with the care you need, when you need it.  We recommend signing up for the patient portal called "MyChart".  Sign up information is provided on this After Visit Summary.  MyChart is used to connect with patients for Virtual Visits (Telemedicine).  Patients are able to view lab/test results, encounter notes, upcoming appointments, etc.  Non-urgent messages can be sent to your provider as well.   To learn more about what you can do with MyChart, go to [email or web address].    Your next appointment:   12 month(s)  The format for your next appointment:   In Person  Provider:   You may see Sanda Klein, MD or one of the following Advanced Practice Providers on your designated Care Team:    Almyra Deforest, PA-C  Fabian Sharp, PA-C or   Roby Lofts, [place]

## 2020-04-08 ENCOUNTER — Telehealth: Payer: Self-pay | Admitting: Cardiovascular Disease

## 2020-04-08 MED ORDER — APIXABAN 2.5 MG PO TABS: 2.5000 mg | ORAL_TABLET | Freq: Two times a day (BID) | ORAL | Status: DC

## 2020-04-08 NOTE — Telephone Encounter (Signed)
Patient calling the office for samples of medication:   1.  What medication and dosage are you requesting samples for? Eliquis   2.  Are you currently out of this medication? Patient is out    

## 2020-04-08 NOTE — Telephone Encounter (Signed)
Spoke with pt and wife who were requesting samples. Informed wife that Eliquis is no longer listed on medication list and per chart review pt was to continue with ASA for prophylaxis as he is unable to afford Eliquis. Wife began to yell and state, "My husband should not have been taking off Eliquis." " Yes, what can't afford it but we have always been able to get samples." " I am tired of doctors and nurse making errors, and if something happen to my husband, I am going to sue." Nurse again, informed wife that Dr. Loletha Grayer didn't recommend pt to stop taking Eliquis but noted an alternative since pt can not afford medication. Wife voiced that husband rely on the office providing samples and demanded to speak to MD's nurse.  Will route message to nurse.

## 2020-04-08 NOTE — Telephone Encounter (Signed)
Returned the call to the wife and the patient. She stated that the patient's kidney doctor told him to take aspirin 81 mg bid when the patient's other providers did not have any Eliquis samples for the patient. She stated that the patient wants to stay on the Eliquis 2.5 mg bid. She has been advised that it is not good for the patient to keep switching back and forth and that he should not be taking both the Aspirin and Eliquis at the same time. She has verbalized her understanding and stated that he only takes the Aspirin when he does not have samples for the Eliquis.   It was suggested that we try for assistance again so that he can stay on just the Eliquis and they both agreed. The assistance forms will be with the patient's samples and he was asked to bring his section back to the office when he had completed them.   Medication Samples have been provided to the patient.  Drug name: Eliquis       Strength: 2.5 mg        Qty: 4 boxes   LOT: [id number]  Exp.Date: 03/2021

## 2020-04-09 DIAGNOSIS — D509 Iron deficiency anemia, unspecified: Secondary | ICD-10-CM | POA: Diagnosis not present

## 2020-04-09 DIAGNOSIS — Z992 Dependence on renal dialysis: Secondary | ICD-10-CM | POA: Diagnosis not present

## 2020-04-09 DIAGNOSIS — E785 Hyperlipidemia, unspecified: Secondary | ICD-10-CM | POA: Diagnosis not present

## 2020-04-09 DIAGNOSIS — N186 End stage renal disease: Secondary | ICD-10-CM | POA: Diagnosis not present

## 2020-04-09 DIAGNOSIS — Z23 Encounter for immunization: Secondary | ICD-10-CM | POA: Diagnosis not present

## 2020-04-09 DIAGNOSIS — D689 Coagulation defect, unspecified: Secondary | ICD-10-CM | POA: Diagnosis not present

## 2020-04-09 DIAGNOSIS — T8249XD Other complication of vascular dialysis catheter, subsequent encounter: Secondary | ICD-10-CM | POA: Diagnosis not present

## 2020-04-09 DIAGNOSIS — N2581 Secondary hyperparathyroidism of renal origin: Secondary | ICD-10-CM | POA: Diagnosis not present

## 2020-04-09 DIAGNOSIS — D631 Anemia in chronic kidney disease: Secondary | ICD-10-CM | POA: Diagnosis not present

## 2020-04-09 DIAGNOSIS — Z283 Underimmunization status: Secondary | ICD-10-CM | POA: Diagnosis not present

## 2020-04-12 DIAGNOSIS — N186 End stage renal disease: Secondary | ICD-10-CM | POA: Diagnosis not present

## 2020-04-12 DIAGNOSIS — D509 Iron deficiency anemia, unspecified: Secondary | ICD-10-CM | POA: Diagnosis not present

## 2020-04-12 DIAGNOSIS — D631 Anemia in chronic kidney disease: Secondary | ICD-10-CM | POA: Diagnosis not present

## 2020-04-12 DIAGNOSIS — N2581 Secondary hyperparathyroidism of renal origin: Secondary | ICD-10-CM | POA: Diagnosis not present

## 2020-04-12 DIAGNOSIS — Z992 Dependence on renal dialysis: Secondary | ICD-10-CM | POA: Diagnosis not present

## 2020-04-12 DIAGNOSIS — E785 Hyperlipidemia, unspecified: Secondary | ICD-10-CM | POA: Diagnosis not present

## 2020-04-12 DIAGNOSIS — Z23 Encounter for immunization: Secondary | ICD-10-CM | POA: Diagnosis not present

## 2020-04-12 DIAGNOSIS — Z283 Underimmunization status: Secondary | ICD-10-CM | POA: Diagnosis not present

## 2020-04-12 DIAGNOSIS — D689 Coagulation defect, unspecified: Secondary | ICD-10-CM | POA: Diagnosis not present

## 2020-04-12 DIAGNOSIS — T8249XD Other complication of vascular dialysis catheter, subsequent encounter: Secondary | ICD-10-CM | POA: Diagnosis not present

## 2020-04-14 DIAGNOSIS — N186 End stage renal disease: Secondary | ICD-10-CM | POA: Diagnosis not present

## 2020-04-14 DIAGNOSIS — Z23 Encounter for immunization: Secondary | ICD-10-CM | POA: Diagnosis not present

## 2020-04-14 DIAGNOSIS — T8249XD Other complication of vascular dialysis catheter, subsequent encounter: Secondary | ICD-10-CM | POA: Diagnosis not present

## 2020-04-14 DIAGNOSIS — Z283 Underimmunization status: Secondary | ICD-10-CM | POA: Diagnosis not present

## 2020-04-14 DIAGNOSIS — D689 Coagulation defect, unspecified: Secondary | ICD-10-CM | POA: Diagnosis not present

## 2020-04-14 DIAGNOSIS — E785 Hyperlipidemia, unspecified: Secondary | ICD-10-CM | POA: Diagnosis not present

## 2020-04-14 DIAGNOSIS — D509 Iron deficiency anemia, unspecified: Secondary | ICD-10-CM | POA: Diagnosis not present

## 2020-04-14 DIAGNOSIS — Z992 Dependence on renal dialysis: Secondary | ICD-10-CM | POA: Diagnosis not present

## 2020-04-14 DIAGNOSIS — N2581 Secondary hyperparathyroidism of renal origin: Secondary | ICD-10-CM | POA: Diagnosis not present

## 2020-04-14 DIAGNOSIS — D631 Anemia in chronic kidney disease: Secondary | ICD-10-CM | POA: Diagnosis not present

## 2020-04-15 ENCOUNTER — Other Ambulatory Visit: Payer: Self-pay | Admitting: Cardiovascular Disease

## 2020-04-15 NOTE — Telephone Encounter (Signed)
Rx(s) sent to pharmacy electronically.  

## 2020-04-16 DIAGNOSIS — T8249XD Other complication of vascular dialysis catheter, subsequent encounter: Secondary | ICD-10-CM | POA: Diagnosis not present

## 2020-04-16 DIAGNOSIS — D631 Anemia in chronic kidney disease: Secondary | ICD-10-CM | POA: Diagnosis not present

## 2020-04-16 DIAGNOSIS — E785 Hyperlipidemia, unspecified: Secondary | ICD-10-CM | POA: Diagnosis not present

## 2020-04-16 DIAGNOSIS — Z23 Encounter for immunization: Secondary | ICD-10-CM | POA: Diagnosis not present

## 2020-04-16 DIAGNOSIS — D509 Iron deficiency anemia, unspecified: Secondary | ICD-10-CM | POA: Diagnosis not present

## 2020-04-16 DIAGNOSIS — Z992 Dependence on renal dialysis: Secondary | ICD-10-CM | POA: Diagnosis not present

## 2020-04-16 DIAGNOSIS — D689 Coagulation defect, unspecified: Secondary | ICD-10-CM | POA: Diagnosis not present

## 2020-04-16 DIAGNOSIS — N186 End stage renal disease: Secondary | ICD-10-CM | POA: Diagnosis not present

## 2020-04-16 DIAGNOSIS — N2581 Secondary hyperparathyroidism of renal origin: Secondary | ICD-10-CM | POA: Diagnosis not present

## 2020-04-16 DIAGNOSIS — Z283 Underimmunization status: Secondary | ICD-10-CM | POA: Diagnosis not present

## 2020-04-19 DIAGNOSIS — D631 Anemia in chronic kidney disease: Secondary | ICD-10-CM | POA: Diagnosis not present

## 2020-04-19 DIAGNOSIS — Z992 Dependence on renal dialysis: Secondary | ICD-10-CM | POA: Diagnosis not present

## 2020-04-19 DIAGNOSIS — Z23 Encounter for immunization: Secondary | ICD-10-CM | POA: Diagnosis not present

## 2020-04-19 DIAGNOSIS — Z283 Underimmunization status: Secondary | ICD-10-CM | POA: Diagnosis not present

## 2020-04-19 DIAGNOSIS — D689 Coagulation defect, unspecified: Secondary | ICD-10-CM | POA: Diagnosis not present

## 2020-04-19 DIAGNOSIS — D509 Iron deficiency anemia, unspecified: Secondary | ICD-10-CM | POA: Diagnosis not present

## 2020-04-19 DIAGNOSIS — E785 Hyperlipidemia, unspecified: Secondary | ICD-10-CM | POA: Diagnosis not present

## 2020-04-19 DIAGNOSIS — N186 End stage renal disease: Secondary | ICD-10-CM | POA: Diagnosis not present

## 2020-04-19 DIAGNOSIS — T8249XD Other complication of vascular dialysis catheter, subsequent encounter: Secondary | ICD-10-CM | POA: Diagnosis not present

## 2020-04-19 DIAGNOSIS — N2581 Secondary hyperparathyroidism of renal origin: Secondary | ICD-10-CM | POA: Diagnosis not present

## 2020-04-21 ENCOUNTER — Telehealth (HOSPITAL_COMMUNITY): Payer: Self-pay

## 2020-04-21 ENCOUNTER — Telehealth: Payer: Self-pay | Admitting: Cardiovascular Disease

## 2020-04-21 DIAGNOSIS — E785 Hyperlipidemia, unspecified: Secondary | ICD-10-CM | POA: Diagnosis not present

## 2020-04-21 DIAGNOSIS — Z283 Underimmunization status: Secondary | ICD-10-CM | POA: Diagnosis not present

## 2020-04-21 DIAGNOSIS — D631 Anemia in chronic kidney disease: Secondary | ICD-10-CM | POA: Diagnosis not present

## 2020-04-21 DIAGNOSIS — N2581 Secondary hyperparathyroidism of renal origin: Secondary | ICD-10-CM | POA: Diagnosis not present

## 2020-04-21 DIAGNOSIS — Z992 Dependence on renal dialysis: Secondary | ICD-10-CM | POA: Diagnosis not present

## 2020-04-21 DIAGNOSIS — D689 Coagulation defect, unspecified: Secondary | ICD-10-CM | POA: Diagnosis not present

## 2020-04-21 DIAGNOSIS — T8249XD Other complication of vascular dialysis catheter, subsequent encounter: Secondary | ICD-10-CM | POA: Diagnosis not present

## 2020-04-21 DIAGNOSIS — N186 End stage renal disease: Secondary | ICD-10-CM | POA: Diagnosis not present

## 2020-04-21 DIAGNOSIS — Z23 Encounter for immunization: Secondary | ICD-10-CM | POA: Diagnosis not present

## 2020-04-21 DIAGNOSIS — D509 Iron deficiency anemia, unspecified: Secondary | ICD-10-CM | POA: Diagnosis not present

## 2020-04-21 NOTE — Telephone Encounter (Signed)
Spoke with the patient, detailed instructions given. He stated he understood and would be here for his test. Asked to call back with any questions. S.Siennah Barrasso EMTP °

## 2020-04-21 NOTE — Telephone Encounter (Signed)
     I went in pt's chart to see who called him today(04-21-20)

## 2020-04-22 ENCOUNTER — Other Ambulatory Visit: Payer: Self-pay

## 2020-04-22 ENCOUNTER — Telehealth: Payer: Self-pay | Admitting: Family Medicine

## 2020-04-22 ENCOUNTER — Ambulatory Visit (HOSPITAL_COMMUNITY): Payer: Medicare Other | Attending: Internal Medicine

## 2020-04-22 ENCOUNTER — Encounter (HOSPITAL_COMMUNITY): Payer: Medicare Other

## 2020-04-22 DIAGNOSIS — Z8679 Personal history of other diseases of the circulatory system: Secondary | ICD-10-CM

## 2020-04-22 DIAGNOSIS — Z9889 Other specified postprocedural states: Secondary | ICD-10-CM | POA: Diagnosis not present

## 2020-04-22 DIAGNOSIS — I5032 Chronic diastolic (congestive) heart failure: Secondary | ICD-10-CM

## 2020-04-22 NOTE — Telephone Encounter (Signed)
Pt is calling in stating that the following medications sertraline (ZOLOFT) 100 MG and temazepam (RESTORIL) 30 MG are not working for him and he does not know if he need a stronger dose of the medications b/c it is not helping him sleep.  Pt would like to have a call back to discuss his options.

## 2020-04-23 DIAGNOSIS — Z992 Dependence on renal dialysis: Secondary | ICD-10-CM | POA: Diagnosis not present

## 2020-04-23 DIAGNOSIS — N186 End stage renal disease: Secondary | ICD-10-CM | POA: Diagnosis not present

## 2020-04-23 DIAGNOSIS — Z23 Encounter for immunization: Secondary | ICD-10-CM | POA: Diagnosis not present

## 2020-04-23 DIAGNOSIS — D689 Coagulation defect, unspecified: Secondary | ICD-10-CM | POA: Diagnosis not present

## 2020-04-23 DIAGNOSIS — D509 Iron deficiency anemia, unspecified: Secondary | ICD-10-CM | POA: Diagnosis not present

## 2020-04-23 DIAGNOSIS — N2581 Secondary hyperparathyroidism of renal origin: Secondary | ICD-10-CM | POA: Diagnosis not present

## 2020-04-23 DIAGNOSIS — E785 Hyperlipidemia, unspecified: Secondary | ICD-10-CM | POA: Diagnosis not present

## 2020-04-23 DIAGNOSIS — I129 Hypertensive chronic kidney disease with stage 1 through stage 4 chronic kidney disease, or unspecified chronic kidney disease: Secondary | ICD-10-CM | POA: Diagnosis not present

## 2020-04-23 DIAGNOSIS — T8249XD Other complication of vascular dialysis catheter, subsequent encounter: Secondary | ICD-10-CM | POA: Diagnosis not present

## 2020-04-23 DIAGNOSIS — D631 Anemia in chronic kidney disease: Secondary | ICD-10-CM | POA: Diagnosis not present

## 2020-04-23 DIAGNOSIS — Z283 Underimmunization status: Secondary | ICD-10-CM | POA: Diagnosis not present

## 2020-04-23 NOTE — Telephone Encounter (Signed)
Left message for patient to call back  

## 2020-04-23 NOTE — Telephone Encounter (Signed)
Set up an OV for [place] to discuss this

## 2020-04-26 DIAGNOSIS — D631 Anemia in chronic kidney disease: Secondary | ICD-10-CM | POA: Diagnosis not present

## 2020-04-26 DIAGNOSIS — D689 Coagulation defect, unspecified: Secondary | ICD-10-CM | POA: Diagnosis not present

## 2020-04-26 DIAGNOSIS — T8249XD Other complication of vascular dialysis catheter, subsequent encounter: Secondary | ICD-10-CM | POA: Diagnosis not present

## 2020-04-26 DIAGNOSIS — D509 Iron deficiency anemia, unspecified: Secondary | ICD-10-CM | POA: Diagnosis not present

## 2020-04-26 DIAGNOSIS — N186 End stage renal disease: Secondary | ICD-10-CM | POA: Diagnosis not present

## 2020-04-26 DIAGNOSIS — Z992 Dependence on renal dialysis: Secondary | ICD-10-CM | POA: Diagnosis not present

## 2020-04-26 DIAGNOSIS — N2581 Secondary hyperparathyroidism of renal origin: Secondary | ICD-10-CM | POA: Diagnosis not present

## 2020-04-27 ENCOUNTER — Other Ambulatory Visit: Payer: Self-pay

## 2020-04-27 ENCOUNTER — Ambulatory Visit (HOSPITAL_COMMUNITY): Payer: Medicare Other | Attending: Cardiology

## 2020-04-27 DIAGNOSIS — I5032 Chronic diastolic (congestive) heart failure: Secondary | ICD-10-CM | POA: Diagnosis not present

## 2020-04-27 MED ORDER — TECHNETIUM TC 99M PYROPHOSPHATE
21.7000 | Freq: Once | INTRAVENOUS | Status: AC
  Administered 2020-04-27: 21.7 via INTRAVENOUS

## 2020-04-28 DIAGNOSIS — Z992 Dependence on renal dialysis: Secondary | ICD-10-CM | POA: Diagnosis not present

## 2020-04-28 DIAGNOSIS — T8249XD Other complication of vascular dialysis catheter, subsequent encounter: Secondary | ICD-10-CM | POA: Diagnosis not present

## 2020-04-28 DIAGNOSIS — D631 Anemia in chronic kidney disease: Secondary | ICD-10-CM | POA: Diagnosis not present

## 2020-04-28 DIAGNOSIS — D689 Coagulation defect, unspecified: Secondary | ICD-10-CM | POA: Diagnosis not present

## 2020-04-28 DIAGNOSIS — D509 Iron deficiency anemia, unspecified: Secondary | ICD-10-CM | POA: Diagnosis not present

## 2020-04-28 DIAGNOSIS — N2581 Secondary hyperparathyroidism of renal origin: Secondary | ICD-10-CM | POA: Diagnosis not present

## 2020-04-28 DIAGNOSIS — N186 End stage renal disease: Secondary | ICD-10-CM | POA: Diagnosis not present

## 2020-04-29 NOTE — Telephone Encounter (Signed)
Patient has been scheduled for a MyChart visit Tuesday 05/04/2020 at 10:30 AM

## 2020-04-30 DIAGNOSIS — D509 Iron deficiency anemia, unspecified: Secondary | ICD-10-CM | POA: Diagnosis not present

## 2020-04-30 DIAGNOSIS — D631 Anemia in chronic kidney disease: Secondary | ICD-10-CM | POA: Diagnosis not present

## 2020-04-30 DIAGNOSIS — Z992 Dependence on renal dialysis: Secondary | ICD-10-CM | POA: Diagnosis not present

## 2020-04-30 DIAGNOSIS — N186 End stage renal disease: Secondary | ICD-10-CM | POA: Diagnosis not present

## 2020-04-30 DIAGNOSIS — N2581 Secondary hyperparathyroidism of renal origin: Secondary | ICD-10-CM | POA: Diagnosis not present

## 2020-04-30 DIAGNOSIS — D689 Coagulation defect, unspecified: Secondary | ICD-10-CM | POA: Diagnosis not present

## 2020-04-30 DIAGNOSIS — T8249XD Other complication of vascular dialysis catheter, subsequent encounter: Secondary | ICD-10-CM | POA: Diagnosis not present

## 2020-05-03 ENCOUNTER — Ambulatory Visit (HOSPITAL_COMMUNITY)
Admission: RE | Admit: 2020-05-03 | Discharge: 2020-05-03 | Disposition: A | Discharge status: Home / Self Care | Payer: Medicare Other | Source: Ambulatory Visit | Attending: Physician Assistant | Admitting: Physician Assistant

## 2020-05-03 ENCOUNTER — Encounter: Payer: Self-pay | Admitting: Surgery

## 2020-05-03 ENCOUNTER — Ambulatory Visit (INDEPENDENT_AMBULATORY_CARE_PROVIDER_SITE_OTHER): Payer: Medicare Other | Admitting: Surgery

## 2020-05-03 ENCOUNTER — Other Ambulatory Visit: Payer: Self-pay

## 2020-05-03 VITALS — BP 118/72 | HR 76 | Temp 96.8°F | Resp 20 | Ht 72.0 in | Wt 330.0 lb

## 2020-05-03 DIAGNOSIS — T8249XD Other complication of vascular dialysis catheter, subsequent encounter: Secondary | ICD-10-CM | POA: Diagnosis not present

## 2020-05-03 DIAGNOSIS — D631 Anemia in chronic kidney disease: Secondary | ICD-10-CM | POA: Diagnosis not present

## 2020-05-03 DIAGNOSIS — I714 Abdominal aortic aneurysm, without rupture, unspecified: Secondary | ICD-10-CM

## 2020-05-03 DIAGNOSIS — D689 Coagulation defect, unspecified: Secondary | ICD-10-CM | POA: Diagnosis not present

## 2020-05-03 DIAGNOSIS — D509 Iron deficiency anemia, unspecified: Secondary | ICD-10-CM | POA: Diagnosis not present

## 2020-05-03 DIAGNOSIS — N2581 Secondary hyperparathyroidism of renal origin: Secondary | ICD-10-CM | POA: Diagnosis not present

## 2020-05-03 DIAGNOSIS — N186 End stage renal disease: Secondary | ICD-10-CM | POA: Diagnosis not present

## 2020-05-03 DIAGNOSIS — Z992 Dependence on renal dialysis: Secondary | ICD-10-CM | POA: Diagnosis not present

## 2020-05-03 NOTE — Progress Notes (Signed)
[facility] [place]  Patient name: [name] MRN: [id number] DOB: 11-02-1971 Sex: male   REASON FOR VISIT:    Follow up  [name]:    [name] is a 49 y.o. male who is a former patient of Dr. Kellie Simmering.  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  He has also had a ascending aortic aneurysm repaired at [facility].  He recently underwent ligation of the left radiocephalic fistula and translocation of the left upper arm cephalic vein with brachiocephalic fistula creation by Dr. Donnetta Hutching.  She is on Eliquis for history of DVT.  He takes a statin for hypercholesterolemia and is medically managed for hypertension.  He is a former smoker.  PAST MEDICAL HISTORY:   Past Medical History:  Diagnosis Date  . Abdominal aortic aneurysm ([place])   . Anemia   . Anxiety   . Aortic regurgitation 12/10/08   Echo: mild to mod. AOV regurg,mild AO root dilatation, LA mildly dilated, EF 65%, LV wall thickness markedly increased, small PE  . Chronic kidney disease   . Chronic renal disease    advanced  . Complication of anesthesia    slow to awaken 1 time  . DVT (deep venous thrombosis) ([place])   . H/O aortic dissection 2009   infarenal abdominal aortic dissection  . History of pulmonary embolus (PE) 2010  . Hypertension    dr [name]  . Left ventricular hypertrophy   . Obesity   . Stroke [facility]) 2009   pt says no stroke     FAMILY HISTORY:   Family History  Problem Relation Age of Onset  . Hypertension Mother   . Hypertension Father     SOCIAL HISTORY:   Social History   Tobacco Use  . Smoking status: Former Smoker    Years: 3.00    Types: Cigarettes    Quit date: 01/25/1991    Years since quitting: 29.2  . Smokeless tobacco: Never Used  Substance Use Topics  . Alcohol use: No    Alcohol/week: 0.0 standard drinks      ALLERGIES:   Allergies  Allergen Reactions  . Oxycodone Hives     CURRENT MEDICATIONS:   Current Outpatient Medications  Medication Sig Dispense Refill  . acetaminophen (TYLENOL) 500 MG tablet Take 1,000 mg by mouth every 6 (six) hours as needed for moderate pain or headache. Reported on 12/14/2015    . amLODipine (NORVASC) 10 MG tablet Take 10 mg by mouth at bedtime.    [name] apixaban (ELIQUIS) 2.5 MG TABS tablet Take 1 tablet (2.5 mg total) by mouth 2 (two) times daily. 60 tablet   . aspirin 81 MG EC tablet Take 81 mg by mouth 2 (two) times daily.     [name] atorvastatin (LIPITOR) 10 MG tablet TAKE 1 TABLET BY MOUTH  DAILY 90 tablet 3  . B Complex-C-Folic Acid (DIALYVITE 295) 0.8 MG TABS Take 1 tablet by mouth daily.    . carvedilol (COREG) 25 MG tablet TAKE 1 TABLET BY MOUTH  TWICE DAILY 180 tablet 3  . Methoxy PEG-Epoetin Beta (MIRCERA IJ) Inject as directed as needed.     . sertraline (ZOLOFT) 100 MG tablet Take 1 tablet (100 mg total) by mouth daily. 30 tablet 11  . temazepam (RESTORIL) 30 MG capsule Take 1 capsule (30 mg total) by mouth at bedtime as needed for sleep. (Patient taking differently: Take 30 mg by  mouth at bedtime. ) 30 capsule 3   No current facility-administered medications for this visit.    REVIEW OF SYSTEMS:   [X]  denotes positive finding, [ ]  denotes negative finding Cardiac  Comments:  Chest pain or chest pressure:    Shortness of breath upon exertion:    Short of breath when lying flat:    Irregular heart rhythm:        Vascular    Pain in calf, thigh, or hip brought on by ambulation:    Pain in feet at night that wakes you up from your sleep:     Blood clot in your veins:    Leg swelling:         Pulmonary    Oxygen at home:    Productive cough:     Wheezing:         Neurologic    Sudden weakness in arms or legs:     Sudden numbness in arms or legs:     Sudden onset of difficulty speaking or slurred speech:    Temporary loss of vision in  one eye:     Problems with dizziness:         Gastrointestinal    Blood in stool:     Vomited blood:         Genitourinary    Burning when urinating:     Blood in urine:        Psychiatric    Major depression:         Hematologic    Bleeding problems:    Problems with blood clotting too easily:        Skin    Rashes or ulcers:        Constitutional    Fever or chills:      PHYSICAL EXAM:   Vitals:   05/03/20 1017  BP: 118/72  Pulse: 76  Resp: 20  Temp: (!) 96.8 F (36 C)  SpO2: 97%  Weight: (!) 330 lb (149.7 kg)  Height: 6' (1.829 m)    GENERAL: The patient is a well-nourished male, in no acute distress. The vital signs are documented above. CARDIAC: There is a regular rate and rhythm.  VASCULAR: Excellent thrill in left upper arm fistula PULMONARY: Non-labored respirations ABDOMEN: Soft and non-tender  MUSCULOSKELETAL: There are no major deformities or cyanosis. NEUROLOGIC: No focal weakness or paresthesias are detected. SKIN: There are no ulcers or rashes noted. PSYCHIATRIC: The patient has a normal affect.  STUDIES:   I have reviewed his ultrasound with the following findings: IVC/Iliac: Patent stent. the largest diamater measures 6.21 x 6.40 cms.  Unable to visualize disection   MEDICAL ISSUES:   The patient is here today for evaluation of his abdominal aortic aneurysm.  His ultrasound suggested an increase in size.  It now measures 6.4 cm.  We have not been able to get a CT angiogram for a while because of his renal function.  He is now on dialysis and so I will order a CT angiogram of the chest abdomen pelvis to evaluate his a sending repair as well as his abdominal aneurysmal repair.  I will get this within the next month and have him follow-up to discuss the results.    Leia Alf, MD, FACS [facility] [phone] Pager [phone]

## 2020-05-04 ENCOUNTER — Telehealth (INDEPENDENT_AMBULATORY_CARE_PROVIDER_SITE_OTHER): Payer: Medicare Other | Admitting: Family Medicine

## 2020-05-04 ENCOUNTER — Encounter: Payer: Self-pay | Admitting: Family Medicine

## 2020-05-04 DIAGNOSIS — G4700 Insomnia, unspecified: Secondary | ICD-10-CM | POA: Diagnosis not present

## 2020-05-04 DIAGNOSIS — F419 Anxiety disorder, unspecified: Secondary | ICD-10-CM | POA: Insufficient documentation

## 2020-05-04 MED ORDER — VENLAFAXINE HCL ER 150 MG PO CP24: 150.0000 mg | ORAL_CAPSULE | Freq: Every day | ORAL | 2 refills | Status: DC

## 2020-05-04 MED ORDER — ZOLPIDEM TARTRATE 10 MG PO TABS: 10.0000 mg | ORAL_TABLET | Freq: Every evening | ORAL | 2 refills | Status: DC | PRN

## 2020-05-04 NOTE — Progress Notes (Signed)
Subjective:    Patient ID: [name], male    DOB: 1971-04-05, 49 y.o.   MRN: [id number]  HPI Virtual Visit via Video Note  I connected with the patient on 05/04/20 at 10:30 AM EDT by a video enabled telemedicine application and verified that I am speaking with the correct person using two identifiers.  Location patient: home Location provider:work or home office Persons participating in the virtual visit: patient, provider  I discussed the limitations of evaluation and management by telemedicine and the availability of in person appointments. The patient expressed understanding and agreed to proceed.   HPI: Here to follow up on anxiety and insomnia. In January he started on Zoloft and Temazepam, and for several months these worked very well for him. However lately his anxiety levels have been going back up, and he has trouble sleeping.    ROS: See pertinent positives and negatives per HPI.  Past Medical History:  Diagnosis Date  . Abdominal aortic aneurysm ([place])   . Anemia   . Anxiety   . Aortic regurgitation 12/10/08   Echo: mild to mod. AOV regurg,mild AO root dilatation, LA mildly dilated, EF 65%, LV wall thickness markedly increased, small PE  . Chronic kidney disease   . Chronic renal disease    advanced  . Complication of anesthesia    slow to awaken 1 time  . DVT (deep venous thrombosis) ([place])   . H/O aortic dissection 2009   infarenal abdominal aortic dissection  . History of pulmonary embolus (PE) 2010  . Hypertension    dr [name]  . Left ventricular hypertrophy   . Obesity   . Stroke [facility]) 2009   pt says no stroke    Past Surgical History:  Procedure Laterality Date  . ABDOMINAL AORTAGRAM N/A 02/06/2012   Procedure: ABDOMINAL Maxcine Ham;  Surgeon: [name], MD;  Location: [facility] CATH LAB;  Service: Cardiovascular;  Laterality: N/A;  . ABDOMINAL AORTIC ANEURYSM REPAIR  09/25/2012   EVAR  . AORTIC ROOT REPLACEMENT  05/07/2017   s/p valve  sparing root replacement with coronary reconstruction along with repair of aortic valve with a 21MM HAART 300 aortic ring annuloplasty at [facility]  . AV FISTULA PLACEMENT Left 05/19/2015   Procedure: ARTERIOVENOUS (AV) FISTULA CREATION-LEFT RADIOCEPHALIC;  Surgeon: Mal [name], MD;  Location: [place];  Service: Vascular;  Laterality: Left;  . BASCILIC VEIN TRANSPOSITION Left 03/11/2020   Procedure: LEFT ARM ARTERIOVENOUS FISTULA  TRANSPOSITION;  Surgeon: [name], MD;  Location: [place];  Service: Vascular;  Laterality: Left;  . CARDIAC CATHETERIZATION  02/11/10   false + Nuc  . CORNEAL TRANSPLANT Left 1997  . FISTULA SUPERFICIALIZATION Left 08/04/2015   Procedure: FISTULA SUPERFICIALIZATION LEFT RADIAL CEPHALIC;  Surgeon: Mal [name], MD;  Location: [place];  Service: Vascular;  Laterality: Left;  . INTRAVASCULAR ULTRASOUND  09/25/2012   Procedure: INTRAVASCULAR ULTRASOUND;  Surgeon: Mal [name], MD;  Location: [place];  Service: Vascular;  Laterality: N/A;  . LIGATION OF COMPETING BRANCHES OF ARTERIOVENOUS FISTULA Left 08/04/2015   Procedure: LIGATION OF MULTIPLE COMPETING BRANCHES OF ARTERIOVENOUS FISTULA;  Surgeon: Mal [name], MD;  Location: [place];  Service: Vascular;  Laterality: Left;  . RESECTION OF ARTERIOVENOUS FISTULA ANEURYSM Left 01/02/2018   Procedure: RESECTION OF ARTERIOVENOUS FISTULA ANEURYSM LEFT RADIOCEPHALIC;  Surgeon: [name], MD;  Location: [facility] OR;  Service: Cardiovascular;  Laterality: Left;    Family History  Problem Relation Age of Onset  . Hypertension  Mother   . Hypertension Father      Current Outpatient Medications:  .  acetaminophen (TYLENOL) 500 MG tablet, Take 1,000 mg by mouth every 6 (six) hours as needed for moderate pain or headache. Reported on 12/14/2015, Disp: , Rfl:  .  amLODipine (NORVASC) 10 MG tablet, Take 10 mg by mouth at bedtime., Disp: , Rfl:  .  apixaban (ELIQUIS) 2.5 MG TABS tablet, Take 1 tablet (2.5 mg total) by mouth 2 (two) times  daily., Disp: 60 tablet, Rfl:  .  aspirin 81 MG EC tablet, Take 81 mg by mouth 2 (two) times daily. , Disp: , Rfl:  .  atorvastatin (LIPITOR) 10 MG tablet, TAKE 1 TABLET BY MOUTH  DAILY, Disp: 90 tablet, Rfl: 3 .  B Complex-C-Folic Acid (DIALYVITE 494) 0.8 MG TABS, Take 1 tablet by mouth daily., Disp: , Rfl:  .  carvedilol (COREG) 25 MG tablet, TAKE 1 TABLET BY MOUTH  TWICE DAILY, Disp: 180 tablet, Rfl: 3 .  Methoxy PEG-Epoetin Beta (MIRCERA IJ), Inject as directed as needed. , Disp: , Rfl:  .  venlafaxine XR (EFFEXOR XR) 150 MG 24 hr capsule, Take 1 capsule (150 mg total) by mouth daily., Disp: 30 capsule, Rfl: 2 .  zolpidem (AMBIEN) 10 MG tablet, Take 1 tablet (10 mg total) by mouth at bedtime as needed for sleep., Disp: 30 tablet, Rfl: 2  EXAM:  VITALS per patient if applicable:  GENERAL: alert, oriented, appears well and in no acute distress  HEENT: atraumatic, conjunttiva clear, no obvious abnormalities on inspection of external nose and ears  NECK: normal movements of the head and neck  LUNGS: on inspection no signs of respiratory distress, breathing rate appears normal, no obvious gross SOB, gasping or wheezing  CV: no obvious cyanosis  MS: moves all visible extremities without noticeable abnormality  PSYCH/NEURO: pleasant and cooperative, no obvious depression or anxiety, speech and thought processing grossly intact  ASSESSMENT AND PLAN: For the anxiety, we will switch to Effexor XR 150 mg daily. For the insomnia we will switch to Zolpidem 10 mg qhs. Follow up in 4 weeks.  Alysia Penna, MD  Discussed the following assessment and plan:  No diagnosis found.     I discussed the assessment and treatment plan with the patient. The patient was provided an opportunity to ask questions and all were answered. The patient agreed with the plan and demonstrated an understanding of the instructions.   The patient was advised to call back or seek an in-person evaluation if the  symptoms worsen or if the condition fails to improve as anticipated.     Review of Systems     Objective:   Physical Exam        Assessment & Plan:

## 2020-05-05 DIAGNOSIS — Z992 Dependence on renal dialysis: Secondary | ICD-10-CM | POA: Diagnosis not present

## 2020-05-05 DIAGNOSIS — D631 Anemia in chronic kidney disease: Secondary | ICD-10-CM | POA: Diagnosis not present

## 2020-05-05 DIAGNOSIS — D509 Iron deficiency anemia, unspecified: Secondary | ICD-10-CM | POA: Diagnosis not present

## 2020-05-05 DIAGNOSIS — D689 Coagulation defect, unspecified: Secondary | ICD-10-CM | POA: Diagnosis not present

## 2020-05-05 DIAGNOSIS — T8249XD Other complication of vascular dialysis catheter, subsequent encounter: Secondary | ICD-10-CM | POA: Diagnosis not present

## 2020-05-05 DIAGNOSIS — N2581 Secondary hyperparathyroidism of renal origin: Secondary | ICD-10-CM | POA: Diagnosis not present

## 2020-05-05 DIAGNOSIS — N186 End stage renal disease: Secondary | ICD-10-CM | POA: Diagnosis not present

## 2020-05-07 DIAGNOSIS — D509 Iron deficiency anemia, unspecified: Secondary | ICD-10-CM | POA: Diagnosis not present

## 2020-05-07 DIAGNOSIS — D689 Coagulation defect, unspecified: Secondary | ICD-10-CM | POA: Diagnosis not present

## 2020-05-07 DIAGNOSIS — D631 Anemia in chronic kidney disease: Secondary | ICD-10-CM | POA: Diagnosis not present

## 2020-05-07 DIAGNOSIS — Z992 Dependence on renal dialysis: Secondary | ICD-10-CM | POA: Diagnosis not present

## 2020-05-07 DIAGNOSIS — N186 End stage renal disease: Secondary | ICD-10-CM | POA: Diagnosis not present

## 2020-05-07 DIAGNOSIS — T8249XD Other complication of vascular dialysis catheter, subsequent encounter: Secondary | ICD-10-CM | POA: Diagnosis not present

## 2020-05-07 DIAGNOSIS — N2581 Secondary hyperparathyroidism of renal origin: Secondary | ICD-10-CM | POA: Diagnosis not present

## 2020-05-10 ENCOUNTER — Other Ambulatory Visit: Payer: Self-pay

## 2020-05-10 DIAGNOSIS — D509 Iron deficiency anemia, unspecified: Secondary | ICD-10-CM | POA: Diagnosis not present

## 2020-05-10 DIAGNOSIS — T8249XD Other complication of vascular dialysis catheter, subsequent encounter: Secondary | ICD-10-CM | POA: Diagnosis not present

## 2020-05-10 DIAGNOSIS — D631 Anemia in chronic kidney disease: Secondary | ICD-10-CM | POA: Diagnosis not present

## 2020-05-10 DIAGNOSIS — N2581 Secondary hyperparathyroidism of renal origin: Secondary | ICD-10-CM | POA: Diagnosis not present

## 2020-05-10 DIAGNOSIS — N186 End stage renal disease: Secondary | ICD-10-CM | POA: Diagnosis not present

## 2020-05-10 DIAGNOSIS — Z992 Dependence on renal dialysis: Secondary | ICD-10-CM | POA: Diagnosis not present

## 2020-05-10 DIAGNOSIS — I714 Abdominal aortic aneurysm, without rupture, unspecified: Secondary | ICD-10-CM

## 2020-05-10 DIAGNOSIS — D689 Coagulation defect, unspecified: Secondary | ICD-10-CM | POA: Diagnosis not present

## 2020-05-11 ENCOUNTER — Other Ambulatory Visit: Payer: Self-pay

## 2020-05-11 ENCOUNTER — Ambulatory Visit (INDEPENDENT_AMBULATORY_CARE_PROVIDER_SITE_OTHER): Payer: Medicare Other | Admitting: Family Medicine

## 2020-05-11 ENCOUNTER — Encounter: Payer: Self-pay | Admitting: Family Medicine

## 2020-05-11 VITALS — BP 126/70 | HR 99 | Temp 97.7°F | Wt 329.2 lb

## 2020-05-11 DIAGNOSIS — N390 Urinary tract infection, site not specified: Secondary | ICD-10-CM

## 2020-05-11 DIAGNOSIS — R300 Dysuria: Secondary | ICD-10-CM

## 2020-05-11 LAB — POCT URINALYSIS DIPSTICK
Bilirubin, UA: NEGATIVE
Blood, UA: NEGATIVE
Glucose, UA: NEGATIVE
Ketones, UA: NEGATIVE
Leukocytes, UA: NEGATIVE
Nitrite, UA: NEGATIVE
Protein, UA: POSITIVE — AB
Spec Grav, UA: 1.01 (ref 1.010–1.025)
Urobilinogen, UA: 0.2 E.U./dL
pH, UA: 8.5 — AB (ref 5.0–8.0)

## 2020-05-11 MED ORDER — SULFAMETHOXAZOLE-TRIMETHOPRIM 800-160 MG PO TABS: 1.0000 | ORAL_TABLET | Freq: Two times a day (BID) | ORAL | 0 refills | Status: DC

## 2020-05-11 MED ORDER — LIDOCAINE 5 % EX OINT: 1.0000 "application " | TOPICAL_OINTMENT | CUTANEOUS | 5 refills | Status: DC | PRN

## 2020-05-11 NOTE — Progress Notes (Signed)
   Subjective:    Patient ID: [name], male    DOB: 21-Apr-1971, 49 y.o.   MRN: [id number]  HPI Here for 3 days of increased urgency and frequency of urination. Some mild burning. No fever. He has never had a UTI before.    Review of Systems  Constitutional: Negative.   Respiratory: Negative.   Cardiovascular: Negative.   Gastrointestinal: Negative.   Genitourinary: Positive for dysuria, frequency and urgency. Negative for flank pain and hematuria.       Objective:   Physical Exam Constitutional:      Appearance: Normal appearance.  Cardiovascular:     Rate and Rhythm: Normal rate and regular rhythm.     Pulses: Normal pulses.     Heart sounds: Normal heart sounds.  Pulmonary:     Effort: Pulmonary effort is normal.     Breath sounds: Normal breath sounds.  Neurological:     Mental Status: He is alert.           Assessment & Plan:  UTI, likely a prostatitis. Treat with Bactrim DS. Culture the sample.  Alysia Penna, MD

## 2020-05-12 DIAGNOSIS — D509 Iron deficiency anemia, unspecified: Secondary | ICD-10-CM | POA: Diagnosis not present

## 2020-05-12 DIAGNOSIS — T8249XD Other complication of vascular dialysis catheter, subsequent encounter: Secondary | ICD-10-CM | POA: Diagnosis not present

## 2020-05-12 DIAGNOSIS — N186 End stage renal disease: Secondary | ICD-10-CM | POA: Diagnosis not present

## 2020-05-12 DIAGNOSIS — Z992 Dependence on renal dialysis: Secondary | ICD-10-CM | POA: Diagnosis not present

## 2020-05-12 DIAGNOSIS — D689 Coagulation defect, unspecified: Secondary | ICD-10-CM | POA: Diagnosis not present

## 2020-05-12 DIAGNOSIS — D631 Anemia in chronic kidney disease: Secondary | ICD-10-CM | POA: Diagnosis not present

## 2020-05-12 DIAGNOSIS — N2581 Secondary hyperparathyroidism of renal origin: Secondary | ICD-10-CM | POA: Diagnosis not present

## 2020-05-12 LAB — URINE CULTURE
MICRO NUMBER:: 10490729
Result:: NO GROWTH
SPECIMEN QUALITY:: ADEQUATE

## 2020-05-14 DIAGNOSIS — D689 Coagulation defect, unspecified: Secondary | ICD-10-CM | POA: Diagnosis not present

## 2020-05-14 DIAGNOSIS — T8249XD Other complication of vascular dialysis catheter, subsequent encounter: Secondary | ICD-10-CM | POA: Diagnosis not present

## 2020-05-14 DIAGNOSIS — D631 Anemia in chronic kidney disease: Secondary | ICD-10-CM | POA: Diagnosis not present

## 2020-05-14 DIAGNOSIS — D509 Iron deficiency anemia, unspecified: Secondary | ICD-10-CM | POA: Diagnosis not present

## 2020-05-14 DIAGNOSIS — N186 End stage renal disease: Secondary | ICD-10-CM | POA: Diagnosis not present

## 2020-05-14 DIAGNOSIS — N2581 Secondary hyperparathyroidism of renal origin: Secondary | ICD-10-CM | POA: Diagnosis not present

## 2020-05-14 DIAGNOSIS — Z992 Dependence on renal dialysis: Secondary | ICD-10-CM | POA: Diagnosis not present

## 2020-05-17 DIAGNOSIS — N186 End stage renal disease: Secondary | ICD-10-CM | POA: Diagnosis not present

## 2020-05-17 DIAGNOSIS — D509 Iron deficiency anemia, unspecified: Secondary | ICD-10-CM | POA: Diagnosis not present

## 2020-05-17 DIAGNOSIS — N2581 Secondary hyperparathyroidism of renal origin: Secondary | ICD-10-CM | POA: Diagnosis not present

## 2020-05-17 DIAGNOSIS — Z992 Dependence on renal dialysis: Secondary | ICD-10-CM | POA: Diagnosis not present

## 2020-05-17 DIAGNOSIS — T8249XD Other complication of vascular dialysis catheter, subsequent encounter: Secondary | ICD-10-CM | POA: Diagnosis not present

## 2020-05-17 DIAGNOSIS — D689 Coagulation defect, unspecified: Secondary | ICD-10-CM | POA: Diagnosis not present

## 2020-05-17 DIAGNOSIS — D631 Anemia in chronic kidney disease: Secondary | ICD-10-CM | POA: Diagnosis not present

## 2020-05-19 DIAGNOSIS — D509 Iron deficiency anemia, unspecified: Secondary | ICD-10-CM | POA: Diagnosis not present

## 2020-05-19 DIAGNOSIS — D689 Coagulation defect, unspecified: Secondary | ICD-10-CM | POA: Diagnosis not present

## 2020-05-19 DIAGNOSIS — N186 End stage renal disease: Secondary | ICD-10-CM | POA: Diagnosis not present

## 2020-05-19 DIAGNOSIS — N2581 Secondary hyperparathyroidism of renal origin: Secondary | ICD-10-CM | POA: Diagnosis not present

## 2020-05-19 DIAGNOSIS — Z992 Dependence on renal dialysis: Secondary | ICD-10-CM | POA: Diagnosis not present

## 2020-05-19 DIAGNOSIS — D631 Anemia in chronic kidney disease: Secondary | ICD-10-CM | POA: Diagnosis not present

## 2020-05-19 DIAGNOSIS — T8249XD Other complication of vascular dialysis catheter, subsequent encounter: Secondary | ICD-10-CM | POA: Diagnosis not present

## 2020-05-21 DIAGNOSIS — D631 Anemia in chronic kidney disease: Secondary | ICD-10-CM | POA: Diagnosis not present

## 2020-05-21 DIAGNOSIS — D689 Coagulation defect, unspecified: Secondary | ICD-10-CM | POA: Diagnosis not present

## 2020-05-21 DIAGNOSIS — Z992 Dependence on renal dialysis: Secondary | ICD-10-CM | POA: Diagnosis not present

## 2020-05-21 DIAGNOSIS — N2581 Secondary hyperparathyroidism of renal origin: Secondary | ICD-10-CM | POA: Diagnosis not present

## 2020-05-21 DIAGNOSIS — N186 End stage renal disease: Secondary | ICD-10-CM | POA: Diagnosis not present

## 2020-05-21 DIAGNOSIS — T8249XD Other complication of vascular dialysis catheter, subsequent encounter: Secondary | ICD-10-CM | POA: Diagnosis not present

## 2020-05-21 DIAGNOSIS — D509 Iron deficiency anemia, unspecified: Secondary | ICD-10-CM | POA: Diagnosis not present

## 2020-05-24 DIAGNOSIS — I129 Hypertensive chronic kidney disease with stage 1 through stage 4 chronic kidney disease, or unspecified chronic kidney disease: Secondary | ICD-10-CM | POA: Diagnosis not present

## 2020-05-24 DIAGNOSIS — D689 Coagulation defect, unspecified: Secondary | ICD-10-CM | POA: Diagnosis not present

## 2020-05-24 DIAGNOSIS — D509 Iron deficiency anemia, unspecified: Secondary | ICD-10-CM | POA: Diagnosis not present

## 2020-05-24 DIAGNOSIS — D631 Anemia in chronic kidney disease: Secondary | ICD-10-CM | POA: Diagnosis not present

## 2020-05-24 DIAGNOSIS — N186 End stage renal disease: Secondary | ICD-10-CM | POA: Diagnosis not present

## 2020-05-24 DIAGNOSIS — Z992 Dependence on renal dialysis: Secondary | ICD-10-CM | POA: Diagnosis not present

## 2020-05-24 DIAGNOSIS — T8249XD Other complication of vascular dialysis catheter, subsequent encounter: Secondary | ICD-10-CM | POA: Diagnosis not present

## 2020-05-24 DIAGNOSIS — N2581 Secondary hyperparathyroidism of renal origin: Secondary | ICD-10-CM | POA: Diagnosis not present

## 2020-05-26 DIAGNOSIS — D509 Iron deficiency anemia, unspecified: Secondary | ICD-10-CM | POA: Diagnosis not present

## 2020-05-26 DIAGNOSIS — D631 Anemia in chronic kidney disease: Secondary | ICD-10-CM | POA: Diagnosis not present

## 2020-05-26 DIAGNOSIS — Z992 Dependence on renal dialysis: Secondary | ICD-10-CM | POA: Diagnosis not present

## 2020-05-26 DIAGNOSIS — N186 End stage renal disease: Secondary | ICD-10-CM | POA: Diagnosis not present

## 2020-05-26 DIAGNOSIS — D689 Coagulation defect, unspecified: Secondary | ICD-10-CM | POA: Diagnosis not present

## 2020-05-26 DIAGNOSIS — R7989 Other specified abnormal findings of blood chemistry: Secondary | ICD-10-CM | POA: Diagnosis not present

## 2020-05-26 DIAGNOSIS — N2581 Secondary hyperparathyroidism of renal origin: Secondary | ICD-10-CM | POA: Diagnosis not present

## 2020-05-26 DIAGNOSIS — T8249XD Other complication of vascular dialysis catheter, subsequent encounter: Secondary | ICD-10-CM | POA: Diagnosis not present

## 2020-05-28 DIAGNOSIS — D631 Anemia in chronic kidney disease: Secondary | ICD-10-CM | POA: Diagnosis not present

## 2020-05-28 DIAGNOSIS — Z992 Dependence on renal dialysis: Secondary | ICD-10-CM | POA: Diagnosis not present

## 2020-05-28 DIAGNOSIS — T8249XD Other complication of vascular dialysis catheter, subsequent encounter: Secondary | ICD-10-CM | POA: Diagnosis not present

## 2020-05-28 DIAGNOSIS — N186 End stage renal disease: Secondary | ICD-10-CM | POA: Diagnosis not present

## 2020-05-28 DIAGNOSIS — R7989 Other specified abnormal findings of blood chemistry: Secondary | ICD-10-CM | POA: Diagnosis not present

## 2020-05-28 DIAGNOSIS — D689 Coagulation defect, unspecified: Secondary | ICD-10-CM | POA: Diagnosis not present

## 2020-05-28 DIAGNOSIS — D509 Iron deficiency anemia, unspecified: Secondary | ICD-10-CM | POA: Diagnosis not present

## 2020-05-28 DIAGNOSIS — N2581 Secondary hyperparathyroidism of renal origin: Secondary | ICD-10-CM | POA: Diagnosis not present

## 2020-05-31 DIAGNOSIS — Z992 Dependence on renal dialysis: Secondary | ICD-10-CM | POA: Diagnosis not present

## 2020-05-31 DIAGNOSIS — N186 End stage renal disease: Secondary | ICD-10-CM | POA: Diagnosis not present

## 2020-05-31 DIAGNOSIS — D631 Anemia in chronic kidney disease: Secondary | ICD-10-CM | POA: Diagnosis not present

## 2020-05-31 DIAGNOSIS — R7989 Other specified abnormal findings of blood chemistry: Secondary | ICD-10-CM | POA: Diagnosis not present

## 2020-05-31 DIAGNOSIS — D509 Iron deficiency anemia, unspecified: Secondary | ICD-10-CM | POA: Diagnosis not present

## 2020-05-31 DIAGNOSIS — D689 Coagulation defect, unspecified: Secondary | ICD-10-CM | POA: Diagnosis not present

## 2020-05-31 DIAGNOSIS — T8249XD Other complication of vascular dialysis catheter, subsequent encounter: Secondary | ICD-10-CM | POA: Diagnosis not present

## 2020-05-31 DIAGNOSIS — N2581 Secondary hyperparathyroidism of renal origin: Secondary | ICD-10-CM | POA: Diagnosis not present

## 2020-06-02 ENCOUNTER — Other Ambulatory Visit: Payer: Self-pay | Admitting: Cardiovascular Disease

## 2020-06-02 DIAGNOSIS — N186 End stage renal disease: Secondary | ICD-10-CM | POA: Diagnosis not present

## 2020-06-02 DIAGNOSIS — R7989 Other specified abnormal findings of blood chemistry: Secondary | ICD-10-CM | POA: Diagnosis not present

## 2020-06-02 DIAGNOSIS — T8249XD Other complication of vascular dialysis catheter, subsequent encounter: Secondary | ICD-10-CM | POA: Diagnosis not present

## 2020-06-02 DIAGNOSIS — D509 Iron deficiency anemia, unspecified: Secondary | ICD-10-CM | POA: Diagnosis not present

## 2020-06-02 DIAGNOSIS — N2581 Secondary hyperparathyroidism of renal origin: Secondary | ICD-10-CM | POA: Diagnosis not present

## 2020-06-02 DIAGNOSIS — D631 Anemia in chronic kidney disease: Secondary | ICD-10-CM | POA: Diagnosis not present

## 2020-06-02 DIAGNOSIS — Z992 Dependence on renal dialysis: Secondary | ICD-10-CM | POA: Diagnosis not present

## 2020-06-02 DIAGNOSIS — D689 Coagulation defect, unspecified: Secondary | ICD-10-CM | POA: Diagnosis not present

## 2020-06-02 NOTE — Telephone Encounter (Signed)
29 M 149.3 kg SCr 5.10 (3/21), LOV Croitoru 4/21  - chart notes low dose Eliquis due to prior bleed issues using Xarelto.

## 2020-06-02 NOTE — Telephone Encounter (Signed)
New Message: ° ° ° °Patient calling the office for samples of medication: ° ° °1.  What medication and dosage are you requesting samples for?  apixaban (ELIQUIS) 2.5 MG TABS tablet ° °2.  Are you currently out of this medication? Yes  ° ° °

## 2020-06-02 NOTE — Telephone Encounter (Signed)
2 wks samples left at front desk.  Please call patient to let him know

## 2020-06-03 ENCOUNTER — Ambulatory Visit
Admission: RE | Admit: 2020-06-03 | Discharge: 2020-06-03 | Disposition: A | Discharge status: Home / Self Care | Payer: Medicare Other | Source: Ambulatory Visit | Attending: Surgery | Admitting: Surgery

## 2020-06-03 DIAGNOSIS — I714 Abdominal aortic aneurysm, without rupture, unspecified: Secondary | ICD-10-CM

## 2020-06-03 MED ORDER — IOPAMIDOL (ISOVUE-370) INJECTION 76%
75.0000 mL | Freq: Once | INTRAVENOUS | Status: AC | PRN
  Administered 2020-06-03: 75 mL via INTRAVENOUS

## 2020-06-04 ENCOUNTER — Telehealth: Payer: Self-pay | Admitting: Cardiovascular Disease

## 2020-06-04 DIAGNOSIS — T8249XD Other complication of vascular dialysis catheter, subsequent encounter: Secondary | ICD-10-CM | POA: Diagnosis not present

## 2020-06-04 DIAGNOSIS — R7989 Other specified abnormal findings of blood chemistry: Secondary | ICD-10-CM | POA: Diagnosis not present

## 2020-06-04 DIAGNOSIS — N2581 Secondary hyperparathyroidism of renal origin: Secondary | ICD-10-CM | POA: Diagnosis not present

## 2020-06-04 DIAGNOSIS — D509 Iron deficiency anemia, unspecified: Secondary | ICD-10-CM | POA: Diagnosis not present

## 2020-06-04 DIAGNOSIS — D689 Coagulation defect, unspecified: Secondary | ICD-10-CM | POA: Diagnosis not present

## 2020-06-04 DIAGNOSIS — D631 Anemia in chronic kidney disease: Secondary | ICD-10-CM | POA: Diagnosis not present

## 2020-06-04 DIAGNOSIS — Z992 Dependence on renal dialysis: Secondary | ICD-10-CM | POA: Diagnosis not present

## 2020-06-04 DIAGNOSIS — N186 End stage renal disease: Secondary | ICD-10-CM | POA: Diagnosis not present

## 2020-06-04 NOTE — Telephone Encounter (Signed)
Wife advised that samples will be placed at front desk for pick up.  Eliquis 2.5 mg Qty: 1 bottle Lot # [id number] Expire 4/22

## 2020-06-04 NOTE — Telephone Encounter (Signed)
Wife of the patient wanted to speak with Dr. Victorino December Nurse. The wife only wanted to speak to the Nurse.  The patient is not having any critical symptoms that would need addressed by a Triage nurse

## 2020-06-04 NOTE — Telephone Encounter (Signed)
Patient's wife states that the patient is out of Eliquis and needs some samples because they are unable to afford it. I advised the patient's wife that I would send a note to the nurses at [facility] to see if they could get him some samples and they would let her know. Patient thanked me for the call back.

## 2020-06-07 ENCOUNTER — Other Ambulatory Visit: Payer: Self-pay | Admitting: Surgery

## 2020-06-07 ENCOUNTER — Encounter: Payer: Self-pay | Admitting: Surgery

## 2020-06-07 ENCOUNTER — Emergency Department (HOSPITAL_COMMUNITY)
Admission: EM | Admit: 2020-06-07 | Discharge: 2020-06-07 | Disposition: A | Discharge status: Home / Self Care | Payer: Medicare Other | Attending: Emergency Medicine | Admitting: Emergency Medicine

## 2020-06-07 ENCOUNTER — Encounter (HOSPITAL_COMMUNITY): Payer: Self-pay | Admitting: Emergency Medicine

## 2020-06-07 ENCOUNTER — Emergency Department (HOSPITAL_COMMUNITY): Payer: Medicare Other

## 2020-06-07 ENCOUNTER — Ambulatory Visit (INDEPENDENT_AMBULATORY_CARE_PROVIDER_SITE_OTHER): Payer: Medicare Other | Admitting: Surgery

## 2020-06-07 ENCOUNTER — Other Ambulatory Visit: Payer: Self-pay

## 2020-06-07 VITALS — BP 116/74 | HR 90 | Temp 97.3°F | Resp 20 | Ht 72.0 in | Wt 327.0 lb

## 2020-06-07 DIAGNOSIS — R52 Pain, unspecified: Secondary | ICD-10-CM | POA: Diagnosis not present

## 2020-06-07 DIAGNOSIS — I130 Hypertensive heart and chronic kidney disease with heart failure and stage 1 through stage 4 chronic kidney disease, or unspecified chronic kidney disease: Secondary | ICD-10-CM | POA: Diagnosis not present

## 2020-06-07 DIAGNOSIS — N184 Chronic kidney disease, stage 4 (severe): Secondary | ICD-10-CM | POA: Diagnosis not present

## 2020-06-07 DIAGNOSIS — Z7901 Long term (current) use of anticoagulants: Secondary | ICD-10-CM | POA: Diagnosis not present

## 2020-06-07 DIAGNOSIS — Z86718 Personal history of other venous thrombosis and embolism: Secondary | ICD-10-CM | POA: Diagnosis not present

## 2020-06-07 DIAGNOSIS — I714 Abdominal aortic aneurysm, without rupture, unspecified: Secondary | ICD-10-CM

## 2020-06-07 DIAGNOSIS — I509 Heart failure, unspecified: Secondary | ICD-10-CM | POA: Insufficient documentation

## 2020-06-07 DIAGNOSIS — R519 Headache, unspecified: Secondary | ICD-10-CM | POA: Insufficient documentation

## 2020-06-07 DIAGNOSIS — R079 Chest pain, unspecified: Secondary | ICD-10-CM | POA: Diagnosis not present

## 2020-06-07 DIAGNOSIS — R55 Syncope and collapse: Secondary | ICD-10-CM | POA: Diagnosis not present

## 2020-06-07 DIAGNOSIS — R42 Dizziness and giddiness: Secondary | ICD-10-CM | POA: Diagnosis not present

## 2020-06-07 DIAGNOSIS — Z79899 Other long term (current) drug therapy: Secondary | ICD-10-CM | POA: Diagnosis not present

## 2020-06-07 DIAGNOSIS — R0602 Shortness of breath: Secondary | ICD-10-CM | POA: Diagnosis not present

## 2020-06-07 DIAGNOSIS — Z743 Need for continuous supervision: Secondary | ICD-10-CM | POA: Diagnosis not present

## 2020-06-07 LAB — BASIC METABOLIC PANEL
Anion gap: 13 (ref 5–15)
BUN: 44 mg/dL — ABNORMAL HIGH (ref 6–20)
CO2: 21 mmol/L — ABNORMAL LOW (ref 22–32)
Calcium: 9.1 mg/dL (ref 8.9–10.3)
Chloride: 103 mmol/L (ref 98–111)
Creatinine, Ser: 10.93 mg/dL — ABNORMAL HIGH (ref 0.61–1.24)
GFR calc Af Amer: 6 mL/min — ABNORMAL LOW (ref >60)
GFR calc non Af Amer: 5 mL/min — ABNORMAL LOW (ref >60)
Glucose, Bld: 100 mg/dL — ABNORMAL HIGH (ref 70–99)
Potassium: 4.1 mmol/L (ref 3.5–5.1)
Sodium: 137 mmol/L (ref 135–145)

## 2020-06-07 LAB — CBC
HCT: 31.1 % — ABNORMAL LOW (ref 39.0–52.0)
Hemoglobin: 9.7 g/dL — ABNORMAL LOW (ref 13.0–17.0)
MCH: 28.4 pg (ref 26.0–34.0)
MCHC: 31.2 g/dL (ref 30.0–36.0)
MCV: 91.2 fL (ref 80.0–100.0)
Platelets: 159 10*3/uL (ref 150–400)
RBC: 3.41 MIL/uL — ABNORMAL LOW (ref 4.22–5.81)
RDW: 15.2 % (ref 11.5–15.5)
WBC: 6.7 10*3/uL (ref 4.0–10.5)
nRBC: 0 % (ref 0.0–0.2)

## 2020-06-07 MED ORDER — SODIUM CHLORIDE 0.9% FLUSH: 3.0000 mL | Freq: Once | INTRAVENOUS | Status: DC

## 2020-06-07 NOTE — ED Provider Notes (Signed)
[facility] EMERGENCY DEPARTMENT Provider Note   CSN: [id number] Arrival date & time: 06/07/20  1223     History Chief Complaint  Patient presents with  . Dizziness    [name] is a 49 y.o. male.  The history is provided by the patient.  Dizziness Quality:  Lightheadedness Severity:  Mild Onset quality:  Gradual Timing:  Intermittent Progression:  Waxing and waning Chronicity:  Recurrent Context: standing up   Relieved by:  Being still Associated symptoms: headaches and shortness of breath   Associated symptoms: no blood in stool, no chest pain, no diarrhea, no hearing loss, no nausea, no palpitations, no tinnitus, no vomiting and no weakness   Risk factors comment:  AAA, ESRD      Past Medical History:  Diagnosis Date  . Abdominal aortic aneurysm ([place])   . Anemia   . Anxiety   . Aortic regurgitation 12/10/08   Echo: mild to mod. AOV regurg,mild AO root dilatation, LA mildly dilated, EF 65%, LV wall thickness markedly increased, small PE  . Chronic kidney disease   . Chronic renal disease    advanced  . Complication of anesthesia    slow to awaken 1 time  . DVT (deep venous thrombosis) ([place])   . H/O aortic dissection 2009   infarenal abdominal aortic dissection  . History of pulmonary embolus (PE) 2010  . Hypertension    dr [name]  . Left ventricular hypertrophy   . Obesity   . Stroke [facility]) 2009   pt says no stroke    Patient Active Problem List   Diagnosis Date Noted  . Insomnia 05/04/2020  . Anxiety disorder 05/04/2020  . Pre-operative cardiovascular examination 12/13/2017  . Normal coronary arteries 06/25/2017  . Long term (current) use of anticoagulants 08/19/2016  . Aortic insufficiency 08/17/2016  . End stage renal disease ([place]) 12/14/2015  . Chronic kidney disease, stage III (moderate) 09/14/2015  . Gout 06/01/2015  . Chronic kidney disease (CKD), stage IV (severe) ([place]) 05/11/2015  . AAA (abdominal aortic  aneurysm) without rupture ([place]) 12/02/2013  . Heart failure with preserved left ventricular function (HFpEF) ([place]) 11/04/2013  . Ascending aortic aneurysm ([place]) 09/08/2013  . h/o VTE (venous thromboembolism) 08/30/2013  . SOB (shortness of breath) 08/30/2013  . Chest pain, atypical 07/30/2013  . Lower extremity edema 07/30/2013  . h/o AAA (abdominal aortic aneurysm) 09/03/2012  . Erectile dysfunction 02/28/2012  . History of DVT (deep vein thrombosis) 02/15/2012  . Abdominal aneurysm without mention of rupture 01/30/2012  . Obesity, morbid, BMI 50 or higher ([place]) 05/27/2009  . HYPERTENSION, BENIGN ESSENTIAL 05/27/2009  . Chronic kidney disease, stage IV (severe) ([place]) 05/27/2009  . Essential hypertension, malignant 05/12/2009    Past Surgical History:  Procedure Laterality Date  . ABDOMINAL AORTAGRAM N/A 02/06/2012   Procedure: ABDOMINAL Maxcine [place];  Surgeon: [name], MD;  Location: [facility] CATH LAB;  Service: Cardiovascular;  Laterality: N/A;  . ABDOMINAL AORTIC ANEURYSM REPAIR  09/25/2012   EVAR  . AORTIC ROOT REPLACEMENT  05/07/2017   s/p valve sparing root replacement with coronary reconstruction along with repair of aortic valve with a 21MM HAART 300 aortic ring annuloplasty at [facility]  . AV FISTULA PLACEMENT Left 05/19/2015   Procedure: ARTERIOVENOUS (AV) FISTULA CREATION-LEFT RADIOCEPHALIC;  Surgeon: Mal [name], MD;  Location: [facility];  Service: Vascular;  Laterality: Left;  . BASCILIC VEIN TRANSPOSITION Left 03/11/2020   Procedure: LEFT ARM ARTERIOVENOUS FISTULA  TRANSPOSITION;  Surgeon: [name], MD;  Location: [facility] OR;  Service: Vascular;  Laterality: Left;  . CARDIAC CATHETERIZATION  02/11/10   false + Nuc  . CORNEAL TRANSPLANT Left 1997  . FISTULA SUPERFICIALIZATION Left 08/04/2015   Procedure: FISTULA SUPERFICIALIZATION LEFT RADIAL CEPHALIC;  Surgeon: Mal [name], MD;  Location: [facility];  Service: Vascular;  Laterality: Left;  . INTRAVASCULAR ULTRASOUND  09/25/2012    Procedure: INTRAVASCULAR ULTRASOUND;  Surgeon: Mal [name], MD;  Location: [facility];  Service: Vascular;  Laterality: N/A;  . LIGATION OF COMPETING BRANCHES OF ARTERIOVENOUS FISTULA Left 08/04/2015   Procedure: LIGATION OF MULTIPLE COMPETING BRANCHES OF ARTERIOVENOUS FISTULA;  Surgeon: Mal [name], MD;  Location: [place];  Service: Vascular;  Laterality: Left;  . RESECTION OF ARTERIOVENOUS FISTULA ANEURYSM Left 01/02/2018   Procedure: RESECTION OF ARTERIOVENOUS FISTULA ANEURYSM LEFT RADIOCEPHALIC;  Surgeon: [name], MD;  Location: [facility] OR;  Service: Cardiovascular;  Laterality: Left;       Family History  Problem Relation Age of Onset  . Hypertension Mother   . Hypertension Father     Social History   Tobacco Use  . Smoking status: Former Smoker    Years: 3.00    Types: Cigarettes    Quit date: 01/25/1991    Years since quitting: 29.3  . Smokeless tobacco: Never Used  Vaping Use  . Vaping Use: Never used  Substance Use Topics  . Alcohol use: No    Alcohol/week: 0.0 standard drinks  . Drug use: No    Home Medications Prior to Admission medications   Medication Sig Start Date End Date Taking? Authorizing Provider  acetaminophen (TYLENOL) 500 MG tablet Take 1,000 mg by mouth every 6 (six) hours as needed for moderate pain or headache. Reported on 12/14/2015    [provider]  amLODipine (NORVASC) 10 MG tablet Take 10 mg by mouth at bedtime. 01/16/20   [provider]  apixaban (ELIQUIS) 2.5 MG TABS tablet Take 1 tablet (2.5 mg total) by mouth 2 (two) times daily. 04/08/20   Croitoru, Mihai, MD  aspirin 81 MG EC tablet Take 81 mg by mouth 2 (two) times daily.     [provider]  atorvastatin (LIPITOR) 10 MG tablet TAKE 1 TABLET BY MOUTH  DAILY 04/15/20   Croitoru, Mihai, MD  B Complex-C-Folic Acid (DIALYVITE 295) 0.8 MG TABS Take 1 tablet by mouth daily. 01/14/20   [provider]  carvedilol (COREG) 25 MG tablet TAKE 1 TABLET BY MOUTH  TWICE  DAILY 04/15/20   Croitoru, Dani Gobble, MD  lactulose [facility]) 10 GM/15ML solution  06/02/20   [provider]  Methoxy PEG-Epoetin Beta (MIRCERA IJ) Inject as directed as needed.  01/26/20 01/24/21  [provider]  venlafaxine XR (EFFEXOR XR) 150 MG 24 hr capsule Take 1 capsule (150 mg total) by mouth daily. 05/04/20   Laurey Morale, MD  zolpidem (AMBIEN) 10 MG tablet Take 1 tablet (10 mg total) by mouth at bedtime as needed for sleep. 05/04/20   Laurey Morale, MD    Allergies    Oxycodone  Review of Systems   Review of Systems  Constitutional: Negative for chills and fever.  HENT: Positive for sore throat. Negative for ear pain, hearing loss and tinnitus.   Eyes: Negative for pain and visual disturbance.  Respiratory: Positive for shortness of breath. Negative for cough.   Cardiovascular: Negative for chest pain and palpitations.  Gastrointestinal: Negative for abdominal pain, blood in stool, diarrhea, nausea and vomiting.  Genitourinary: Negative for  dysuria and hematuria.  Musculoskeletal: Negative for arthralgias and back pain.  Skin: Negative for color change and rash.  Neurological: Positive for dizziness and headaches. Negative for seizures, syncope and weakness.  All other systems reviewed and are negative.   Physical Exam Updated Vital Signs  ED Triage Vitals  Enc Vitals Group     BP 06/07/20 1238 104/68     Pulse Rate 06/07/20 1238 86     Resp 06/07/20 1238 17     Temp 06/07/20 1238 98.3 F (36.8 C)     Temp Source 06/07/20 1653 Oral     SpO2 06/07/20 1238 100 %     Weight 06/07/20 1234 (!) 324 lb (147 kg)     Height 06/07/20 1234 6' (1.829 m)     Head Circumference --      Peak Flow --      Pain Score 06/07/20 1234 0     Pain Loc --      Pain Edu? --      Excl. in [place]? --     Physical Exam Vitals and nursing note reviewed.  Constitutional:      General: He is not in acute distress.    Appearance: He is well-developed. He is not ill-appearing.   HENT:     Head: Normocephalic and atraumatic.     Right Ear: Tympanic membrane normal.     Left Ear: Tympanic membrane normal.     Nose: Nose normal.     Mouth/Throat:     Mouth: Mucous membranes are moist.     Pharynx: No oropharyngeal exudate or posterior oropharyngeal erythema.  Eyes:     Extraocular Movements: Extraocular movements intact.     Conjunctiva/sclera: Conjunctivae normal.     Pupils: Pupils are equal, round, and reactive to light.  Cardiovascular:     Rate and Rhythm: Normal rate and regular rhythm.     Pulses: Normal pulses.     Heart sounds: Normal heart sounds. No murmur heard.   Pulmonary:     Effort: Pulmonary effort is normal. No respiratory distress.     Breath sounds: Normal breath sounds.  Abdominal:     General: Abdomen is flat. There is no distension.     Palpations: Abdomen is soft.     Tenderness: There is no abdominal tenderness.  Musculoskeletal:        General: Normal range of motion.     Cervical back: Normal range of motion and neck supple.     Right lower leg: No edema.     Left lower leg: No edema.  Skin:    General: Skin is warm and dry.  Neurological:     General: No focal deficit present.     Mental Status: He is alert and oriented to person, place, and time.     Cranial Nerves: No cranial nerve deficit.     Sensory: No sensory deficit.     Motor: No weakness.     Coordination: Coordination normal.     Comments: 5+ out of 5 strength throughout, normal sensation, no drift, normal finger-to-nose finger, normal speech, normal gait     ED Results / Procedures / Treatments   Labs (all labs ordered are listed, but only abnormal results are displayed) Labs Reviewed  BASIC METABOLIC PANEL - Abnormal; Notable for the following components:      Result Value   CO2 21 (*)    Glucose, Bld 100 (*)    BUN 44 (*)  Creatinine, Ser 10.93 (*)    GFR calc non Af Amer 5 (*)    GFR calc Af Amer 6 (*)    All other components within normal  limits  CBC - Abnormal; Notable for the following components:   RBC 3.41 (*)    Hemoglobin 9.7 (*)    HCT 31.1 (*)    All other components within normal limits  URINALYSIS, ROUTINE W REFLEX MICROSCOPIC  CBG MONITORING, ED    EKG EKG Interpretation  Date/Time:  Monday June 07 2020 18:42:35 EDT Ventricular Rate:  81 PR Interval:    QRS Duration: 113 QT Interval:  416 QTC Calculation: 483 R Axis:   57 Text Interpretation: Sinus rhythm Borderline intraventricular conduction delay Repol abnrm suggests ischemia, lateral leads No significant change since last tracing Confirmed by Lennice Sites [phone]) on 06/07/2020 6:48:07 PM   Radiology CT Head Wo Contrast  Result Date: 06/07/2020 CLINICAL DATA:  Acute headache with normal neuro exam. Lightheaded and dizzy at dialysis. Three syncopal episodes today. EXAM: CT HEAD WITHOUT CONTRAST TECHNIQUE: Contiguous axial images were obtained from the base of the skull through the vertex without intravenous contrast. COMPARISON:  None. FINDINGS: Brain: No intracranial hemorrhage, mass effect, or midline shift. No hydrocephalus. The basilar cisterns are patent. No evidence of territorial infarct or acute ischemia. No extra-axial or intracranial fluid collection. Vascular: No hyperdense vessel or unexpected calcification. Skull: No fracture or focal lesion.  Frontal hyperostosis Sinuses/Orbits: Paranasal sinuses and mastoid air cells are clear. The visualized orbits are unremarkable. Other: None. IMPRESSION: Negative noncontrast head CT. Electronically Signed   By: Keith Rake M.D.   On: 06/07/2020 19:52   DG Chest Portable 1 View  Result Date: 06/07/2020 CLINICAL DATA:  Near syncope while on dialysis. Chest pain and shortness of breath. EXAM: PORTABLE CHEST 1 VIEW COMPARISON:  06/03/2020 FINDINGS: Previous median sternotomy and CABG procedure. Cardiac enlargement. Right chest wall dialysis catheter is noted with tip at the cavoatrial junction. Decreased  lung volumes with asymmetric elevation of right hemidiaphragm, unchanged. IMPRESSION: No acute cardiopulmonary abnormalities. Electronically Signed   By: Kerby Moors M.D.   On: 06/07/2020 18:45    Procedures Procedures (including critical care time)  Medications Ordered in ED Medications  sodium chloride flush (NS) 0.9 % injection 3 mL (has no administration in time range)    ED Course  I have reviewed the triage vital signs and the nursing notes.  Pertinent labs & imaging results that were available during my care of the patient were reviewed by me and considered in my medical decision making (see chart for details).    MDM Rules/Calculators/A&P                          [name] is a 49 year old male with history of CKD, AAA, DVT on blood thinners who presents to the ED after near syncopal event/dizziness.  Patient with normal vitals.  No fever.  Patient with 2 episodes of near syncope today.  Positional dizziness.  No chest pain or shortness of breath currently but did have some shortness of breath earlier today.  No signs of chest infection or fluid overload on chest x-ray.  EKG shows sinus rhythm.  Unchanged compared to prior.  No ischemic changes that are new.  Not having any chest pain.  EKG showed no evidence of arrhythmia.  Did not fully lose consciousness.  Doubt cardiac process.  Doubt PE given that patient is on blood  thinner.  No significant anemia, electrolyte abnormality.  Creatinine elevated but patient with ESRD.  Potassium within normal limits.  Does not have any abdominal pain.  Recently did have CT scanning to evaluate for AAA and has an endoleak and is following up with IR.  He saw vascular surgery today and overall he is doing well from that standpoint.  Patient has daily dizziness.  Blood pressure typically is low he states.  Does not take pressure medications on days of dialysis.  Given that his had some the symptoms for a long time suspect may be medication  related.  Recommend that he follow-up with his nephrologist and his cardiologist to discuss blood pressure medication.  No concern for stroke given normal neurological exam.  Head CT was normal as well.  Given return precautions and discharged in ED in good condition.  This chart was dictated using voice recognition software.  Despite best efforts to proofread,  errors can occur which can change the documentation meaning.    Final Clinical Impression(s) / ED Diagnoses Final diagnoses:  Dizziness    Rx / DC Orders ED Discharge Orders    None       Lennice Sites, DO 06/07/20 2110

## 2020-06-07 NOTE — ED Triage Notes (Signed)
EMS stated, on the way to dialysis and had a syncope episode . Sit down given ginger ale at Dr. Gabriel Carina. Pt stated, felt dizzy.

## 2020-06-07 NOTE — ED Notes (Signed)
pts wife Levada Dy, [phone]

## 2020-06-07 NOTE — Discharge Instructions (Addendum)
Follow-up with cardiology and nephrology to discuss blood pressure medications.  Return to the ED if symptoms worsen.

## 2020-06-07 NOTE — Progress Notes (Signed)
[facility] [place]  Patient name: [name] MRN: [id number] DOB: 1971/08/27 Sex: male   REASON FOR VISIT:    Follow up AAA  [name]:    [name] is a 49 y.o. male who is a former patient of Dr. Kellie Simmering.  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  He has also had a ascending aortic aneurysm repaired at [facility].  His most recent u/s showed a increase in the size of his AAA.  We had not previously been able to get a CTA because of his renal issues.  He is now on dialysis.  He is back for CTA review.  He does say that he began having back pain last night that radiates around to the front.  He recently underwent ligation of the left radiocephalic fistula and translocation of the left upper arm cephalic vein with brachiocephalic fistula creation by Dr. Donnetta Hutching.  He is on Eliquis for history of DVT.  He takes a statin for hypercholesterolemia and is medically managed for hypertension.  He is a former smoker.  PAST MEDICAL HISTORY:   Past Medical History:  Diagnosis Date  . Abdominal aortic aneurysm ([place])   . Anemia   . Anxiety   . Aortic regurgitation 12/10/08   Echo: mild to mod. AOV regurg,mild AO root dilatation, LA mildly dilated, EF 65%, LV wall thickness markedly increased, small PE  . Chronic kidney disease   . Chronic renal disease    advanced  . Complication of anesthesia    slow to awaken 1 time  . DVT (deep venous thrombosis) ([place])   . H/O aortic dissection 2009   infarenal abdominal aortic dissection  . History of pulmonary embolus (PE) 2010  . Hypertension    dr [name]  . Left ventricular hypertrophy   . Obesity   . Stroke [facility]) 2009   pt says no stroke     FAMILY HISTORY:   Family History  Problem Relation Age of Onset  . Hypertension Mother   . Hypertension Father     SOCIAL HISTORY:    Social History   Tobacco Use  . Smoking status: Former Smoker    Years: 3.00    Types: Cigarettes    Quit date: 01/25/1991    Years since quitting: 29.3  . Smokeless tobacco: Never Used  Substance Use Topics  . Alcohol use: No    Alcohol/week: 0.0 standard drinks     ALLERGIES:   Allergies  Allergen Reactions  . Oxycodone Hives     CURRENT MEDICATIONS:   Current Outpatient Medications  Medication Sig Dispense Refill  . acetaminophen (TYLENOL) 500 MG tablet Take 1,000 mg by mouth every 6 (six) hours as needed for moderate pain or headache. Reported on 12/14/2015    . amLODipine (NORVASC) 10 MG tablet Take 10 mg by mouth at bedtime.    [name] apixaban (ELIQUIS) 2.5 MG TABS tablet Take 1 tablet (2.5 mg total) by mouth 2 (two) times daily. 60 tablet   . aspirin 81 MG EC tablet Take 81 mg by mouth 2 (two) times daily.     [name] atorvastatin (LIPITOR) 10 MG tablet TAKE 1 TABLET BY MOUTH  DAILY 90 tablet 3  . B Complex-C-Folic Acid (DIALYVITE 086) 0.8 MG TABS Take 1 tablet by mouth daily.    . carvedilol (COREG) 25 MG tablet TAKE 1 TABLET BY MOUTH  TWICE DAILY 180 tablet 3  .  lactulose (CHRONULAC) 10 GM/15ML solution     . Methoxy PEG-Epoetin Beta (MIRCERA IJ) Inject as directed as needed.     . venlafaxine XR (EFFEXOR XR) 150 MG 24 hr capsule Take 1 capsule (150 mg total) by mouth daily. 30 capsule 2  . zolpidem (AMBIEN) 10 MG tablet Take 1 tablet (10 mg total) by mouth at bedtime as needed for sleep. 30 tablet 2   No current facility-administered medications for this visit.    REVIEW OF SYSTEMS:   [X]  denotes positive finding, [ ]  denotes negative finding Cardiac  Comments:  Chest pain or chest pressure:    Shortness of breath upon exertion:    Short of breath when lying flat:    Irregular heart rhythm:        Vascular    Pain in calf, thigh, or hip brought on by ambulation:    Pain in feet at night that wakes you up from your sleep:     Blood clot in your veins:    Leg  swelling:         Pulmonary    Oxygen at home:    Productive cough:     Wheezing:         Neurologic    Sudden weakness in arms or legs:     Sudden numbness in arms or legs:     Sudden onset of difficulty speaking or slurred speech:    Temporary loss of vision in one eye:     Problems with dizziness:         Gastrointestinal    Blood in stool:     Vomited blood:         Genitourinary    Burning when urinating:     Blood in urine:        Psychiatric    Major depression:         Hematologic    Bleeding problems:    Problems with blood clotting too easily:        Skin    Rashes or ulcers:        Constitutional    Fever or chills:      PHYSICAL EXAM:   Vitals:   06/07/20 0845  BP: 116/74  Pulse: 90  Resp: 20  Temp: (!) 97.3 F (36.3 C)  SpO2: 95%  Weight: (!) 327 lb (148.3 kg)  Height: 6' (1.829 m)    GENERAL: The patient is a well-nourished male, in no acute distress. The vital signs are documented above. CARDIAC: There is a regular rate and rhythm.  PULMONARY: Non-labored respirations MUSCULOSKELETAL: There are no major deformities or cyanosis. NEUROLOGIC: No focal weakness or paresthesias are detected. SKIN: There are no ulcers or rashes noted. PSYCHIATRIC: The patient has a normal affect.  STUDIES:   I have reviewed the CTA with the following findings: 1. Status post aortic root replacement with some residual dilatation of the proximal root measuring up to 4.9-5.0 cm. The ascending aortic graft is normally patent and demonstrates no evidence of anastomotic pseudoaneurysm. 2. Significant enlargement of the surrounding aneurysm sac since the prior unenhanced CT in 2018 with maximal sac dimensions of 5.9 x 6.9 cm (remeasured at 4.2 x 4.7 cm on the unenhanced 2018 study). On the arterial phase, there are some opacified lumbar arteries at the L3, L4 and L5 levels that approach the posterior sac. 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 This is likely partly due to technical factors due to the patient's size and degree of arterial contrast opacification. There is no evidence of type I endoleak. 3. 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 4. Fusiform dilatation of a normally patent celiac trunk measuring up to 13 mm. 5. Cholelithiasis. 6. Multiple bilateral renal cysts. In addition, some hyperdense cortical lesions are identified with the largest measuring 2.4 cm at the level of the lower pole of the right kidney. These may represent hemorrhagic cysts. Some of these may be acquired from hemodialysis. Additional follow-up with CT recommended.  MEDICAL ISSUES:   Status post endovascular repair of abdominal aortic aneurysm.  He has had progressive enlargement of his aneurysm sac however this has not been evaluated by CT scan until recently due to renal issues.  His CT scan shows a maximum aortic diameter of 6.9 cm.  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  I discussed referral to interventional radiology for treatment of his endoleak.  I have scheduled him to see me again in 6 months  Status post aortic root replacement at [facility].  He gets annual follow-up with.  Bilateral renal stents: He has known about these and is monitoring them.  Cholelithiasis: Potentially his pain that he is experiencing now is related to this.  We have discussed a fat-free healthy diet to see if this alleviates his symptoms.  If not, he needs to seek follow-up with his primary care physician for further evaluation of his gallbladder with right upper quadrant ultrasound    Annamarie Major, IV, MD, FACS [facility] [phone] Pager [phone]

## 2020-06-08 ENCOUNTER — Ambulatory Visit
Admission: RE | Admit: 2020-06-08 | Discharge: 2020-06-08 | Disposition: A | Discharge status: Home / Self Care | Payer: Medicare Other | Source: Ambulatory Visit | Attending: Surgery | Admitting: Surgery

## 2020-06-08 ENCOUNTER — Encounter: Payer: Self-pay | Admitting: *Deleted

## 2020-06-08 ENCOUNTER — Telehealth: Payer: Self-pay | Admitting: Cardiovascular Disease

## 2020-06-08 DIAGNOSIS — I714 Abdominal aortic aneurysm, without rupture, unspecified: Secondary | ICD-10-CM

## 2020-06-08 DIAGNOSIS — T82330A Leakage of aortic (bifurcation) graft (replacement), initial encounter: Secondary | ICD-10-CM | POA: Diagnosis not present

## 2020-06-08 HISTORY — PX: IR RADIOLOGIST EVAL & MGMT: IMG5224

## 2020-06-08 MED ORDER — CARVEDILOL 25 MG PO TABS: 12.5000 mg | ORAL_TABLET | Freq: Two times a day (BID) | ORAL | 3 refills | Status: DC

## 2020-06-08 NOTE — Telephone Encounter (Signed)
Stop the amlodipine. If SBP is over 120, take carvedilol 12.5 mg twice daily (half of his previous dose; OK to cut pills in half). Has he been eating and drinking OK? Any vomiting or diarrhea? Any bleeding? Has he been compliant with Eliquis?

## 2020-06-08 NOTE — Telephone Encounter (Signed)
Patient states he was in the hospital yesterday and was advised to stop taking some of his BP medication.  Patient states he passed out three times yesterday.  He stated that Dr. Sallyanne Kuster and the renal doctor need to get together to come up with a plan.

## 2020-06-08 NOTE — Telephone Encounter (Signed)
The concern is that he may have had a pulmonary embolism causing the low BP. If this is confirmed, we need to not only restart Eliquis, but to use at a higher dose of 10 mg twice daily for the first week. The echo is the least aggressive diagnostic test  to clarify this. We could do a D-dimer (if normal does not need echo, if elevated, needs echo).

## 2020-06-08 NOTE — Telephone Encounter (Signed)
Spoke with patient. Patient "fell out" in the parking lot of dialysis yesterday. Paramedics were unable to to obtain a manual BP for over 10 minutes per spouse and when they did it was 90/47. Patient had not taken his blood pressure medications that day and was unable to have dialysis due to the event. He was evaluated at the ER and they told him to have cardiology and nephrology to work together on his blood pressure.   Asked that patient monitor BP daily. Check BP before he takes his blood pressure medications to ensure it is stable before he medicates. He will hold his medication if his blood pressure is less than 100/60. He should check his blood pressure and heart rate during his dizzy and lightheaded episodes to help determine if they are coming from hypotension or hypertension.   Patient placed on schedule for 6/30 with Dr. Sallyanne Kuster and will follow up with nephrology before that appointment. Patient will bring a log of blood pressures and symptoms to each appointment. Patient to call back if he has any other issues or concerns.   Will route to MD for review, no sooner available appointment at this time with MD or APP.

## 2020-06-08 NOTE — Telephone Encounter (Signed)
Called  Patient . Patient was concerned of why he needs another  Echo , since he had one  In April 2021. Patient states they did a lot of testing yesterday at the ER.  patient would like for Dr Sallyanne Kuster to review and respond again

## 2020-06-08 NOTE — Consult Note (Signed)
Chief Complaint: Enlarging aneurysm sac, SP EVAR.    Referring Physician(s): [name],Vance W  History of Present Illness: [name] is a 49 y.o. male presenting as a scheduled follow up to [facility], kindly referred by Dr. Trula Slade, for evaluation of growth of aneurysm sac SP EVAR, and suspected endoleak.   Jack. Evans joins [place] today virtually.  His wife is on the call.  We confirmed his identity with 2 personal identifiers.   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  He was treated by his Vascular Surgery team of Dr. Kellie Simmering and Dr. Trula Slade on 09/25/2012.    The EVAR was performed percutaneously, with Gore Excluder Device.  Main body was left, 70mm x 14.54mm x 12cm; contra limb from the right was 15mm x 14cm; left extension was 16mm x 12cm.  The landing zone was bilateral CIA, into ectatic segments.    The post-dissection aneurysm was maximal diameter of 5.6 cm at the time of treatment.   There was then several interval CT scans for surveillance which showed positive remodeling, with the size decreasing over time.  My measurement on CT 10/29/2017 is 3.7cm.    Dr. Stephens Shire office notes indicate that the patient had surveillance US demonstrating an increased size of the sac, which led to CT performed 06/03/20.  This confirms enlargement of the sac, with greatest diameter estimated 6.9cm.    [name] denies any symptoms of abdominal or back pain at this time, stating that he feels fine.    He denies any fevers, rigors, chills.    He continues with his M/W/F dialysis.    Past Medical History:  Diagnosis Date  . Abdominal aortic aneurysm ([place])   . Anemia   . Anxiety   . Aortic regurgitation 12/10/08   Echo: mild to mod. AOV regurg,mild AO root dilatation, LA mildly dilated, EF 65%, LV wall thickness markedly increased, small PE  . Chronic kidney disease   . Chronic renal disease    advanced  . Complication of  anesthesia    slow to awaken 1 time  . DVT (deep venous thrombosis) ([place])   . H/O aortic dissection 2009   infarenal abdominal aortic dissection  . History of pulmonary embolus (PE) 2010  . Hypertension    dr [name]  . Left ventricular hypertrophy   . Obesity   . Stroke [facility]) 2009   pt says no stroke    Past Surgical History:  Procedure Laterality Date  . ABDOMINAL AORTAGRAM N/A 02/06/2012   Procedure: ABDOMINAL Maxcine Ham;  Surgeon: [name], MD;  [facility] C[facility];  Service: Cardiovascular;  Laterality: N/A;  . ABDOMINAL AORTIC ANEURYSM REPAIR  09/25/2012   EVAR  . AORTIC ROOT REPLACEMENT  05/07/2017   s/p valve sparing root replacement with coronary reconstruction along with repair of aortic valve with a 21MM HAART 300 aortic ring annuloplasty at [facility]  . AV FISTULA PLACEMENT Left 05/19/2015   Procedure: ARTERIOVENOUS (AV) FISTULA CREATION-LEFT RADIOCEPHALIC;  Surgeon: Mal [name], MD;  Location: [place];  Service: Vascular;  Laterality: Left;  . BASCILIC VEIN TRANSPOSITION Left 03/11/2020   Procedure: LEFT ARM ARTERIOVENOUS FISTULA  TRANSPOSITION;  Surgeon: [name], MD;  Location: [place];  Service: Vascular;  Laterality: Left;  . CARDIAC CATHETERIZATION  02/11/10   false + Nuc  . CORNEAL TRANSPLANT Left 1997  . FISTULA SUPERFICIALIZATION Left 08/04/2015   Procedure: FISTULA SUPERFICIALIZATION LEFT RADIAL CEPHALIC;  Surgeon: Nelda Severe  Kellie Simmering, MD;  Location: [place];  Service: Vascular;  Laterality: Left;  . INTRAVASCULAR ULTRASOUND  09/25/2012   Procedure: INTRAVASCULAR ULTRASOUND;  Surgeon: Mal [name], MD;  Location: [facility] OR;  Service: Vascular;  Laterality: N/A;  . IR RADIOLOGIST EVAL & MGMT  06/08/2020  . LIGATION OF COMPETING BRANCHES OF ARTERIOVENOUS FISTULA Left 08/04/2015   Procedure: LIGATION OF MULTIPLE COMPETING BRANCHES OF ARTERIOVENOUS FISTULA;  Surgeon: Mal [name], MD;  Location: [place];  Service: Vascular;  Laterality: Left;  . RESECTION OF ARTERIOVENOUS  FISTULA ANEURYSM Left 01/02/2018   Procedure: RESECTION OF ARTERIOVENOUS FISTULA ANEURYSM LEFT RADIOCEPHALIC;  Surgeon: [name], MD;  Location: [place];  Service: Cardiovascular;  Laterality: Left;    Allergies: Oxycodone  Medications: Prior to Admission medications   Medication Sig Start Date End Date Taking? Authorizing Provider  acetaminophen (TYLENOL) 500 MG tablet Take 1,000 mg by mouth every 6 (six) hours as needed for moderate pain or headache. Reported on 12/14/2015    [provider]  apixaban (ELIQUIS) 2.5 MG TABS tablet Take 1 tablet (2.5 mg total) by mouth 2 (two) times daily. 04/08/20   Croitoru, Mihai, MD  aspirin 81 MG EC tablet Take 81 mg by mouth 2 (two) times daily.     [provider]  atorvastatin (LIPITOR) 10 MG tablet TAKE 1 TABLET BY MOUTH  DAILY 04/15/20   Croitoru, Mihai, MD  B Complex-C-Folic Acid (DIALYVITE 101) 0.8 MG TABS Take 1 tablet by mouth daily. 01/14/20   [provider]  carvedilol (COREG) 25 MG tablet Take 0.5 tablets (12.5 mg total) by mouth 2 (two) times daily. If SBP > 120 06/08/20   Croitoru, Mihai, MD  lactulose [facility]) 10 GM/15ML solution  06/02/20   [provider]  Methoxy PEG-Epoetin Beta (MIRCERA IJ) Inject as directed as needed.  01/26/20 01/24/21  [provider]  venlafaxine XR (EFFEXOR XR) 150 MG 24 hr capsule Take 1 capsule (150 mg total) by mouth daily. 05/04/20   Laurey Morale, MD  zolpidem (AMBIEN) 10 MG tablet Take 1 tablet (10 mg total) by mouth at bedtime as needed for sleep. 05/04/20   Laurey Morale, MD     Family History  Problem Relation Age of Onset  . Hypertension Mother   . Hypertension Father     Social History   Socioeconomic History  . Marital status: Married    Spouse name: Not on file  . Number of children: Not on file  . Years of education: Not on file  . Highest education level: Not on file  Occupational History  . Not on file  Tobacco Use  . Smoking status: Former  Smoker    Years: 3.00    Types: Cigarettes    Quit date: 01/25/1991    Years since quitting: 29.3  . Smokeless tobacco: Never Used  Vaping Use  . Vaping Use: Never used  Substance and Sexual Activity  . Alcohol use: No    Alcohol/week: 0.0 standard drinks  . Drug use: No  . Sexual activity: Not Currently    Partners: Female  Other Topics Concern  . Not on file  Social History Narrative  . Not on file   Social Determinants of Health   Financial Resource Strain:   . Difficulty of Paying Living Expenses:   Food Insecurity:   . Worried About Charity fundraiser in the Last Year:   . Arboriculturist in the Last Year:   Transportation Needs:   .  Lack of Transportation (Medical):   [name] Lack of Transportation (Non-Medical):   Physical Activity:   . Days of Exercise per Week:   . Minutes of Exercise per Session:   Stress:   . Feeling of Stress :   Social Connections:   . Frequency of Communication with Friends and Family:   . Frequency of Social Gatherings with Friends and Family:   . Attends Religious Services:   . Active Member of Clubs or Organizations:   . Attends Archivist Meetings:   [name] Marital Status:        Review of Systems  Review of Systems: A 12 point ROS discussed and pertinent positives are indicated in the HPI above.  All other systems are negative.  Physical Exam No direct physical exam was performed (except for noted visual exam findings with Video Visits).    Vital Signs: There were no vitals taken for this visit.  Imaging: CT Head Wo Contrast  Result Date: 06/07/2020 CLINICAL DATA:  Acute headache with normal neuro exam. Lightheaded and dizzy at dialysis. Three syncopal episodes today. EXAM: CT HEAD WITHOUT CONTRAST TECHNIQUE: Contiguous axial images were obtained from the base of the skull through the vertex without intravenous contrast. COMPARISON:  None. FINDINGS: Brain: No intracranial hemorrhage, mass effect, or midline shift. No  hydrocephalus. The basilar cisterns are patent. No evidence of territorial infarct or acute ischemia. No extra-axial or intracranial fluid collection. Vascular: No hyperdense vessel or unexpected calcification. Skull: No fracture or focal lesion.  Frontal hyperostosis Sinuses/Orbits: Paranasal sinuses and mastoid air cells are clear. The visualized orbits are unremarkable. Other: None. IMPRESSION: Negative noncontrast head CT. Electronically Signed   By: Keith Rake M.D.   On: 06/07/2020 19:52   DG Chest Portable 1 View  Result Date: 06/07/2020 CLINICAL DATA:  Near syncope while on dialysis. Chest pain and shortness of breath. EXAM: PORTABLE CHEST 1 VIEW COMPARISON:  06/03/2020 FINDINGS: Previous median sternotomy and CABG procedure. Cardiac enlargement. Right chest wall dialysis catheter is noted with tip at the cavoatrial junction. Decreased lung volumes with asymmetric elevation of right hemidiaphragm, unchanged. IMPRESSION: No acute cardiopulmonary abnormalities. Electronically Signed   By: Kerby Moors M.D.   On: 06/07/2020 18:45   CT ANGIO CHEST AORTA W/CM & OR WO/CM  Result Date: 06/04/2020 CLINICAL DATA:  Status post EVAR to treat abdominal aortic dissection with aneurysm formation in 2013. Status post aortic root replacement with repair of aortic valve in 2018 for aneurysmal disease of the aortic root. EXAM: CT ANGIOGRAPHY CHEST, ABDOMEN AND PELVIS TECHNIQUE: Non-contrast CT of the chest was initially obtained. Multidetector CT imaging through the chest, abdomen and pelvis was performed using the standard protocol during bolus administration of intravenous contrast. Multiplanar reconstructed images and MIPs were obtained and reviewed to evaluate the vascular anatomy. CONTRAST:  22mL ISOVUE-370 IOPAMIDOL (ISOVUE-370) INJECTION 76% COMPARISON:  CT of the abdomen and pelvis on 10/29/2017, MRA of the chest on 01/30/2017 FINDINGS: CTA CHEST FINDINGS Cardiovascular: After aortic root replacement,  there is some residual dilatation of the proximal root measuring up to 4.9-5.0 cm primarily due to some dilatation of the left posterior aortic sinus. The ascending aortic graft is normally patent and demonstrates no evidence anastomotic pseudoaneurysm. The aortic arch measures 2.9-3.3 cm. The descending thoracic aorta measures 2.8 cm. No evidence of aortic dissection. Visualized proximal great vessels demonstrate normal patency. The heart size is normal. No pericardial fluid identified. No significant calcified coronary artery plaque with suggestion of minimal plaque in the distribution  of the RCA. Tunneled hemodialysis catheter present via the lower right internal jugular vein with catheter tip just below the SVC/RA junction. Central pulmonary arteries are normal in caliber. Mediastinum/Nodes: No enlarged mediastinal, hilar, or axillary lymph nodes. Thyroid gland, trachea, and esophagus demonstrate no significant findings. Lungs/Pleura: There is no evidence of pulmonary edema, consolidation, pneumothorax, nodule or pleural fluid. Musculoskeletal: No chest wall abnormality. No acute or significant osseous findings. Review of the MIP images confirms the above findings. CTA ABDOMEN AND PELVIS FINDINGS VASCULAR Aorta: Aortic endograft demonstrates stable positioning since prior unenhanced studies without evidence of migration or angulation. Proximal aortic and bilateral common iliac limb apposition appears intact and stable. There is significant enlargement of the surrounding aneurysm sac since the prior unenhanced CT in 2018 with maximal sac dimensions of 5.9 x 6.9 cm. When remeasured ring with similar axis on the prior exam in 2018, the sac measures 4.2 x 4.7 cm. On the arterial phase, there are some opacified lumbar arteries at the L3, L4 and L5 levels that approach the posterior sac margin. The superior and posterior aspect of the aneurysm sac does demonstrate increase in density from the unenhanced phase to the  arterial phase and delayed phase. The more anterior aspect of the aneurysm sac does not demonstrate progressive increase in density. Findings would support the presence of a type II endoleak supplied by one or more lumbar arteries but the actual focal inflow is difficult to visualize by CTA. This is likely partly due to technical factors due to the patient's size and degree of arterial contrast opacification. There is no evidence of a type I endoleak. Celiac: Fusiform dilatation of a normally patent celiac trunk measuring up to 13 mm. Distal branch vessels demonstrate normal patency. SMA: Normally patent. Renals: Normally patent bilateral single renal arteries. IMA: Occluded origin. IMA branches are reconstituted by SMA collaterals. No evidence of visible Inflow: Bilateral iliac arteries demonstrate normal patency. 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 The left iliac endograft limb extends to the common iliac bifurcation. The bifurcation itself measures approximately 2.3 cm. Bilateral internal and external iliac arteries demonstrate normal patency. Bilateral common femoral arteries and femoral bifurcations are normally patent. Veins: Delayed imaging shows no venous abnormalities in the abdomen or pelvis. Review of the MIP images confirms the above findings. NON-VASCULAR Hepatobiliary: Unremarkable liver. Multiple gallstones. No evidence of biliary dilatation. Pancreas: Unremarkable. No pancreatic ductal dilatation or surrounding inflammatory changes. Spleen: Normal in size without focal abnormality. Adrenals/Urinary Tract: Bilateral atrophic kidneys without evidence of hydronephrosis. There are several bilateral renal cysts. In addition, some hyperdense cortical lesions are identified with the largest measuring 2.4 cm at the level of the lower pole of the right kidney. These may represent hemorrhagic cysts. Some of these may be acquired  from hemodialysis. Additional follow-up with CT recommended. Stomach/Bowel: No evidence of bowel obstruction, ileus, lesion, inflammation or free air. Lymphatic: No enlarged lymph nodes identified. Reproductive: Prostate is unremarkable. Other: No ascites or abnormal fluid collections. Small umbilical hernia containing fat. Musculoskeletal: No acute or significant osseous findings. Review of the MIP images confirms the above findings. IMPRESSION: 1. Status post aortic root replacement with some residual dilatation of the proximal root measuring up to 4.9-5.0 cm. The ascending aortic graft is normally patent and demonstrates no evidence of anastomotic pseudoaneurysm. 2. Significant enlargement of the surrounding aneurysm sac since the prior unenhanced CT in 2018 with maximal sac dimensions of 5.9 x  6.9 cm (remeasured at 4.2 x 4.7 cm on the unenhanced 2018 study). On the arterial phase, there are some opacified lumbar arteries at the L3, L4 and L5 levels that approach the posterior sac. Findings would support the presence of a type II endoleak supplied by one or more lumbar arteries but the actual focal inflow is difficult to visualize by CTA. This is likely partly due to technical factors due to the patient's size and degree of arterial contrast opacification. There is no evidence of type I endoleak. 3. 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 4. Fusiform dilatation of a normally patent celiac trunk measuring up to 13 mm. 5. Cholelithiasis. 6. Multiple bilateral renal cysts. In addition, some hyperdense cortical lesions are identified with the largest measuring 2.4 cm at the level of the lower pole of the right kidney. These may represent hemorrhagic cysts. Some of these may be acquired from hemodialysis. Additional follow-up with CT recommended. Electronically Signed   By: Aletta Edouard M.D.   On: 06/04/2020 09:40   IR Radiologist Eval &  Mgmt  Result Date: 06/08/2020 Please refer to notes tab for details about interventional procedure. (Op Note)  CT ANGIO ABDOMEN PELVIS  W &/OR WO CONTRAST  Result Date: 06/04/2020 CLINICAL DATA:  Status post EVAR to treat abdominal aortic dissection with aneurysm formation in 2013. Status post aortic root replacement with repair of aortic valve in 2018 for aneurysmal disease of the aortic root. EXAM: CT ANGIOGRAPHY CHEST, ABDOMEN AND PELVIS TECHNIQUE: Non-contrast CT of the chest was initially obtained. Multidetector CT imaging through the chest, abdomen and pelvis was performed using the standard protocol during bolus administration of intravenous contrast. Multiplanar reconstructed images and MIPs were obtained and reviewed to evaluate the vascular anatomy. CONTRAST:  28mL ISOVUE-370 IOPAMIDOL (ISOVUE-370) INJECTION 76% COMPARISON:  CT of the abdomen and pelvis on 10/29/2017, MRA of the chest on 01/30/2017 FINDINGS: CTA CHEST FINDINGS Cardiovascular: After aortic root replacement, there is some residual dilatation of the proximal root measuring up to 4.9-5.0 cm primarily due to some dilatation of the left posterior aortic sinus. The ascending aortic graft is normally patent and demonstrates no evidence anastomotic pseudoaneurysm. The aortic arch measures 2.9-3.3 cm. The descending thoracic aorta measures 2.8 cm. No evidence of aortic dissection. Visualized proximal great vessels demonstrate normal patency. The heart size is normal. No pericardial fluid identified. No significant calcified coronary artery plaque with suggestion of minimal plaque in the distribution of the RCA. Tunneled hemodialysis catheter present via the lower right internal jugular vein with catheter tip just below the SVC/RA junction. Central pulmonary arteries are normal in caliber. Mediastinum/Nodes: No enlarged mediastinal, hilar, or axillary lymph nodes. Thyroid gland, trachea, and esophagus demonstrate no significant findings.  Lungs/Pleura: There is no evidence of pulmonary edema, consolidation, pneumothorax, nodule or pleural fluid. Musculoskeletal: No chest wall abnormality. No acute or significant osseous findings. Review of the MIP images confirms the above findings. CTA ABDOMEN AND PELVIS FINDINGS VASCULAR Aorta: Aortic endograft demonstrates stable positioning since prior unenhanced studies without evidence of migration or angulation. Proximal aortic and bilateral common iliac limb apposition appears intact and stable. There is significant enlargement of the surrounding aneurysm sac since the prior unenhanced CT in 2018 with maximal sac dimensions of 5.9 x 6.9 cm. When remeasured ring with similar axis on the prior exam in 2018, the sac measures 4.2 x 4.7 cm. On the arterial phase, there are some opacified lumbar  arteries at the L3, L4 and L5 levels that approach the posterior sac margin. The superior and posterior aspect of the aneurysm sac does demonstrate increase in density from the unenhanced phase to the arterial phase and delayed phase. The more anterior aspect of the aneurysm sac does not demonstrate progressive increase in density. Findings would support the presence of a type II endoleak supplied by one or more lumbar arteries but the actual focal inflow is difficult to visualize by CTA. This is likely partly due to technical factors due to the patient's size and degree of arterial contrast opacification. There is no evidence of a type I endoleak. Celiac: Fusiform dilatation of a normally patent celiac trunk measuring up to 13 mm. Distal branch vessels demonstrate normal patency. SMA: Normally patent. Renals: Normally patent bilateral single renal arteries. IMA: Occluded origin. IMA branches are reconstituted by SMA collaterals. No evidence of visible Inflow: Bilateral iliac arteries demonstrate normal patency. 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 The left iliac endograft limb extends to the common iliac bifurcation. The bifurcation itself measures approximately 2.3 cm. Bilateral internal and external iliac arteries demonstrate normal patency. Bilateral common femoral arteries and femoral bifurcations are normally patent. Veins: Delayed imaging shows no venous abnormalities in the abdomen or pelvis. Review of the MIP images confirms the above findings. NON-VASCULAR Hepatobiliary: Unremarkable liver. Multiple gallstones. No evidence of biliary dilatation. Pancreas: Unremarkable. No pancreatic ductal dilatation or surrounding inflammatory changes. Spleen: Normal in size without focal abnormality. Adrenals/Urinary Tract: Bilateral atrophic kidneys without evidence of hydronephrosis. There are several bilateral renal cysts. In addition, some hyperdense cortical lesions are identified with the largest measuring 2.4 cm at the level of the lower pole of the right kidney. These may represent hemorrhagic cysts. Some of these may be acquired from hemodialysis. Additional follow-up with CT recommended. Stomach/Bowel: No evidence of bowel obstruction, ileus, lesion, inflammation or free air. Lymphatic: No enlarged lymph nodes identified. Reproductive: Prostate is unremarkable. Other: No ascites or abnormal fluid collections. Small umbilical hernia containing fat. Musculoskeletal: No acute or significant osseous findings. Review of the MIP images confirms the above findings. IMPRESSION: 1. Status post aortic root replacement with some residual dilatation of the proximal root measuring up to 4.9-5.0 cm. The ascending aortic graft is normally patent and demonstrates no evidence of anastomotic pseudoaneurysm. 2. Significant enlargement of the surrounding aneurysm sac since the prior unenhanced CT in 2018 with maximal sac dimensions of 5.9 x 6.9 cm (remeasured at 4.2 x 4.7 cm on the unenhanced 2018 study). On the arterial phase, there are some opacified lumbar arteries at  the L3, L4 and L5 levels that approach the posterior sac. Findings would support the presence of a type II endoleak supplied by one or more lumbar arteries but the actual focal inflow is difficult to visualize by CTA. This is likely partly due to technical factors due to the patient's size and degree of arterial contrast opacification. There is no evidence of type I endoleak. 3. 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 4. Fusiform dilatation of a normally patent celiac trunk measuring up to 13 mm. 5. Cholelithiasis. 6. Multiple bilateral renal cysts. In addition, some hyperdense cortical lesions are identified with the largest measuring 2.4 cm at the level of the lower pole of the right kidney. These may represent hemorrhagic cysts. Some of these may be acquired from  hemodialysis. Additional follow-up with CT recommended. Electronically Signed   By: Aletta Edouard M.D.   On: 06/04/2020 09:40    Labs:  CBC: Recent Labs    12/12/19 1303 12/29/19 1342 03/11/20 0801 06/07/20 1244  WBC  --   --   --  6.7  HGB 9.6* 9.8* 11.6* 9.7*  HCT  --   --  34.0* 31.1*  PLT  --   --   --  159    COAGS: No results for input(s): INR, APTT in the last 8760 hours.  BMP: Recent Labs    03/11/20 0801 06/07/20 1244  NA 139 137  K 2.9* 4.1  CL 96* 103  CO2  --  21*  GLUCOSE 107* 100*  BUN 15 44*  CALCIUM  --  9.1  CREATININE 5.10* 10.93*  GFRNONAA  --  5*  GFRAA  --  6*    LIVER FUNCTION TESTS: No results for input(s): BILITOT, AST, ALT, ALKPHOS, PROT, ALBUMIN in the last 8760 hours.  TUMOR MARKERS: No results for input(s): AFPTM, CEA, CA199, CHROMGRNA in the last 8760 hours.  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   I had a lengthy discussion with him regarding his diagnosis.  This included a basic  discussion of the surgical trade-off of open vs EVAR repair, with decreased up-front mortality/morbidity of EVAR yielding to a known increased rate of secondary intervention, the natural history and cited risk of rupture of untreated endoleak (~2% by [facility] data), and the indications for our interventions.  I reminded him that when deciding to pursue a repair of AAA with EVAR, we accept a known risk of late reinterventions, sometimes even multiple interventions, for the decreased upfront morbidity/mortality risk.  Given that, it is not unusual to need intervention for endoleak, sometimes even more than one intervention for a persisting leak.  Our strategy at this point might be: active surveillance vs treatment, with potential treatment options to include trans-arterial, trans-venous, or direct sac access.  Specific risks that we discussed regarding invention include those of angiogram/embolization including: bleeding, infection, arterial dissection, need for further surgery/procedure, hospitalization, contrast reaction, kidney injury, non-target embolization, cardiopulmonary collapse, death.    Regarding active surveillance, I did discuss with him my concern that there has been late enlargement now about 8-9 years out, which is concerning to me.  My recommendation would be for trans-arterial treatment attempt.   After our discussion, he would like to proceed with treatment.   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  With Dr. Earleen Newport at [facility], moderate sedation.  - We will need to contact his prescribing provider of his anti-coagulation to get clearance to withdraw and determine any need for a bridge.    Thank you for this interesting consult.  I greatly enjoyed meeting [name] and look forward to participating in their care.  A copy of this report was sent to the requesting provider on this  date.  Electronically Signed: Corrie Evans 06/08/2020, 5:06 PM   I spent a total of  40 Minutes   in remote  clinical consultation, greater than 50% of which was counseling/coordinating care for endoleak with enlarging AAA aneurysm sac.    Visit type: Audio only (telephone). Audio (no video) only due to patient's lack of internet/smartphone capability. Alternative for  in-person consultation at [facility], [address], [place], [place]. This visit type was conducted due to national recommendations for restrictions regarding the COVID-19 Pandemic (e.g. social distancing).  This format is felt to be most appropriate for this patient at this time.  All issues noted in this document were discussed and addressed.

## 2020-06-08 NOTE — Telephone Encounter (Signed)
That makes me worry that his lower BP could be due to a repeat pulmonary embolism. The treatment would be to restart the eliquis of course. Can we please schedule him for an echo to see if there is any sign of worsened right ventricular function, please. Order ASAP. thanks

## 2020-06-08 NOTE — Telephone Encounter (Signed)
Patient updated on new medication instructions. Medication list updated. Patient states he has been drinking and eating OK. No N/V/D. No bleeding. He says he has been taking his Eliquis but when he's out he takes 2-81mg  aspirin. He would not specify how often that occurs but states his wife just picked up his Eliquis recently.

## 2020-06-09 ENCOUNTER — Telehealth: Payer: Self-pay | Admitting: Cardiovascular Disease

## 2020-06-09 ENCOUNTER — Telehealth: Payer: Self-pay

## 2020-06-09 DIAGNOSIS — Z79899 Other long term (current) drug therapy: Secondary | ICD-10-CM

## 2020-06-09 DIAGNOSIS — R42 Dizziness and giddiness: Secondary | ICD-10-CM

## 2020-06-09 DIAGNOSIS — N186 End stage renal disease: Secondary | ICD-10-CM | POA: Diagnosis not present

## 2020-06-09 DIAGNOSIS — N2581 Secondary hyperparathyroidism of renal origin: Secondary | ICD-10-CM | POA: Diagnosis not present

## 2020-06-09 DIAGNOSIS — T8249XD Other complication of vascular dialysis catheter, subsequent encounter: Secondary | ICD-10-CM | POA: Diagnosis not present

## 2020-06-09 DIAGNOSIS — D509 Iron deficiency anemia, unspecified: Secondary | ICD-10-CM | POA: Diagnosis not present

## 2020-06-09 DIAGNOSIS — D689 Coagulation defect, unspecified: Secondary | ICD-10-CM | POA: Diagnosis not present

## 2020-06-09 DIAGNOSIS — D631 Anemia in chronic kidney disease: Secondary | ICD-10-CM | POA: Diagnosis not present

## 2020-06-09 DIAGNOSIS — R7989 Other specified abnormal findings of blood chemistry: Secondary | ICD-10-CM | POA: Diagnosis not present

## 2020-06-09 DIAGNOSIS — Z992 Dependence on renal dialysis: Secondary | ICD-10-CM | POA: Diagnosis not present

## 2020-06-09 NOTE — Telephone Encounter (Addendum)
Dialysis center contacted [phone]).  They are able to draw a D-dimer.  Order faxed to dialysis center.  They use [facility] so, results should be posted to epic.

## 2020-06-09 NOTE — Telephone Encounter (Signed)
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 Pt is on dialysis.  Procedure: embolization of type 2 endo leak Date of procedure: TBD  Patient can hold Eliquis for 2 days prior to procedure as requested.

## 2020-06-09 NOTE — Telephone Encounter (Signed)
Follow Up  Angie is calling back from the dialysis center and states that she will be drawing the d-dimer lab.

## 2020-06-09 NOTE — Telephone Encounter (Signed)
New Message     [facility] Pre-operative Risk Assessment    HEARTCARE STAFF: - Please ensure there is not already an duplicate clearance open for this procedure. - Under Visit Info/Reason for Call, type in Other and utilize the format Clearance MM/DD/YY or Clearance TBD. Do not use dashes or single digits. - If request is for dental extraction, please clarify the # of teeth to be extracted.  Request for surgical clearance:  1. What type of surgery is being performed? Embolization of Type 2 Endo Leak   2. When is this surgery scheduled? TBD (Wednesday, Thursday, or Friday of next week)   3. What type of clearance is required (medical clearance vs. Pharmacy clearance to hold med vs. Both)? Pharmacy  4. Are there any medications that need to be held prior to surgery and how long? Hold Eliquis for 48 hours prior   5. Practice name and name of physician performing surgery? [facility]; Dr. Earleen Evans  6. What is the office phone number? [phone]   7.   What is the office fax number? [phone]  8.   Anesthesia type (None, local, MAC, general) ? Moderate   [name] 06/09/2020, 8:21 AM  _________________________________________________________________   (provider comments below)

## 2020-06-09 NOTE — Telephone Encounter (Signed)
Per [name], FNP-C:dialysis lab is checking to see if they can draw a d-dimer. Their fax number is [phone]; d-dimer order for me.  [name] Male, 49 y.o., 1971/10/09 MRN: [id number] phone [phone] Order placed and faxed to dialysis center.  CALLED AND VERIFIED RECEIPT W/JUNE.   Called and verified W/Raif Chachere that they do use [facility] they will use lab slip provided

## 2020-06-09 NOTE — Telephone Encounter (Signed)
    [name] -from [facility] called, she said they can't do the d- dimer today and will do the blood work on [employer]

## 2020-06-10 DIAGNOSIS — Z452 Encounter for adjustment and management of vascular access device: Secondary | ICD-10-CM | POA: Diagnosis not present

## 2020-06-10 NOTE — Telephone Encounter (Signed)
Per [name], FNP-C:dialysis lab is checking to see if they can draw a d-dimer. Their fax number is [phone]; d-dimer order for me.  [name] Male, 49 y.o., 11/13/1971 MRN: [id number] phone [phone] Order placed and faxed to dialysis center.  CALLED AND VERIFIED RECEIPT W/JUNE.   Called and verified W/Zakariah Urwin that they do use [facility] they will use lab slip provided

## 2020-06-11 ENCOUNTER — Other Ambulatory Visit (HOSPITAL_COMMUNITY): Payer: Self-pay | Admitting: Interventional Radiology

## 2020-06-11 DIAGNOSIS — D509 Iron deficiency anemia, unspecified: Secondary | ICD-10-CM | POA: Diagnosis not present

## 2020-06-11 DIAGNOSIS — T8249XD Other complication of vascular dialysis catheter, subsequent encounter: Secondary | ICD-10-CM | POA: Diagnosis not present

## 2020-06-11 DIAGNOSIS — D631 Anemia in chronic kidney disease: Secondary | ICD-10-CM | POA: Diagnosis not present

## 2020-06-11 DIAGNOSIS — Z992 Dependence on renal dialysis: Secondary | ICD-10-CM | POA: Diagnosis not present

## 2020-06-11 DIAGNOSIS — R7989 Other specified abnormal findings of blood chemistry: Secondary | ICD-10-CM | POA: Diagnosis not present

## 2020-06-11 DIAGNOSIS — N2581 Secondary hyperparathyroidism of renal origin: Secondary | ICD-10-CM | POA: Diagnosis not present

## 2020-06-11 DIAGNOSIS — I714 Abdominal aortic aneurysm, without rupture, unspecified: Secondary | ICD-10-CM

## 2020-06-11 DIAGNOSIS — D689 Coagulation defect, unspecified: Secondary | ICD-10-CM | POA: Diagnosis not present

## 2020-06-11 DIAGNOSIS — N186 End stage renal disease: Secondary | ICD-10-CM | POA: Diagnosis not present

## 2020-06-14 DIAGNOSIS — T8249XD Other complication of vascular dialysis catheter, subsequent encounter: Secondary | ICD-10-CM | POA: Diagnosis not present

## 2020-06-14 DIAGNOSIS — Z992 Dependence on renal dialysis: Secondary | ICD-10-CM | POA: Diagnosis not present

## 2020-06-14 DIAGNOSIS — D631 Anemia in chronic kidney disease: Secondary | ICD-10-CM | POA: Diagnosis not present

## 2020-06-14 DIAGNOSIS — N2581 Secondary hyperparathyroidism of renal origin: Secondary | ICD-10-CM | POA: Diagnosis not present

## 2020-06-14 DIAGNOSIS — D689 Coagulation defect, unspecified: Secondary | ICD-10-CM | POA: Diagnosis not present

## 2020-06-14 DIAGNOSIS — N186 End stage renal disease: Secondary | ICD-10-CM | POA: Diagnosis not present

## 2020-06-14 DIAGNOSIS — R7989 Other specified abnormal findings of blood chemistry: Secondary | ICD-10-CM | POA: Diagnosis not present

## 2020-06-14 DIAGNOSIS — D509 Iron deficiency anemia, unspecified: Secondary | ICD-10-CM | POA: Diagnosis not present

## 2020-06-16 DIAGNOSIS — N2581 Secondary hyperparathyroidism of renal origin: Secondary | ICD-10-CM | POA: Diagnosis not present

## 2020-06-16 DIAGNOSIS — R7989 Other specified abnormal findings of blood chemistry: Secondary | ICD-10-CM | POA: Diagnosis not present

## 2020-06-16 DIAGNOSIS — D509 Iron deficiency anemia, unspecified: Secondary | ICD-10-CM | POA: Diagnosis not present

## 2020-06-16 DIAGNOSIS — T8249XD Other complication of vascular dialysis catheter, subsequent encounter: Secondary | ICD-10-CM | POA: Diagnosis not present

## 2020-06-16 DIAGNOSIS — D631 Anemia in chronic kidney disease: Secondary | ICD-10-CM | POA: Diagnosis not present

## 2020-06-16 DIAGNOSIS — N186 End stage renal disease: Secondary | ICD-10-CM | POA: Diagnosis not present

## 2020-06-16 DIAGNOSIS — Z992 Dependence on renal dialysis: Secondary | ICD-10-CM | POA: Diagnosis not present

## 2020-06-16 DIAGNOSIS — D689 Coagulation defect, unspecified: Secondary | ICD-10-CM | POA: Diagnosis not present

## 2020-06-16 NOTE — Telephone Encounter (Signed)
CALLED DIALYSIS CENTER TO SEE IF THEY HAVE D-DIMER RESULTS. THEY STATE THAT THEY WERE UNABLE TO TO DO D-DIMER ON Friday BUT THEY DID DRAW IT YESTERDAY THEY SHOULD GET RESULT TOMORROW.

## 2020-06-17 ENCOUNTER — Other Ambulatory Visit: Payer: Self-pay | Admitting: Radiology

## 2020-06-17 DIAGNOSIS — N186 End stage renal disease: Secondary | ICD-10-CM | POA: Diagnosis not present

## 2020-06-17 DIAGNOSIS — D689 Coagulation defect, unspecified: Secondary | ICD-10-CM | POA: Diagnosis not present

## 2020-06-17 DIAGNOSIS — Z992 Dependence on renal dialysis: Secondary | ICD-10-CM | POA: Diagnosis not present

## 2020-06-17 DIAGNOSIS — D509 Iron deficiency anemia, unspecified: Secondary | ICD-10-CM | POA: Diagnosis not present

## 2020-06-17 DIAGNOSIS — N2581 Secondary hyperparathyroidism of renal origin: Secondary | ICD-10-CM | POA: Diagnosis not present

## 2020-06-17 DIAGNOSIS — R7989 Other specified abnormal findings of blood chemistry: Secondary | ICD-10-CM | POA: Diagnosis not present

## 2020-06-17 DIAGNOSIS — D631 Anemia in chronic kidney disease: Secondary | ICD-10-CM | POA: Diagnosis not present

## 2020-06-17 DIAGNOSIS — T8249XD Other complication of vascular dialysis catheter, subsequent encounter: Secondary | ICD-10-CM | POA: Diagnosis not present

## 2020-06-18 ENCOUNTER — Other Ambulatory Visit: Payer: Self-pay

## 2020-06-18 ENCOUNTER — Other Ambulatory Visit (HOSPITAL_COMMUNITY): Payer: Self-pay | Admitting: Interventional Radiology

## 2020-06-18 ENCOUNTER — Ambulatory Visit (HOSPITAL_COMMUNITY)
Admission: RE | Admit: 2020-06-18 | Discharge: 2020-06-18 | Disposition: A | Discharge status: Home / Self Care | Payer: Medicare Other | Source: Ambulatory Visit | Attending: Interventional Radiology | Admitting: Interventional Radiology

## 2020-06-18 ENCOUNTER — Telehealth: Payer: Self-pay | Admitting: General Practice

## 2020-06-18 DIAGNOSIS — F419 Anxiety disorder, unspecified: Secondary | ICD-10-CM | POA: Insufficient documentation

## 2020-06-18 DIAGNOSIS — Z79899 Other long term (current) drug therapy: Secondary | ICD-10-CM | POA: Diagnosis not present

## 2020-06-18 DIAGNOSIS — I714 Abdominal aortic aneurysm, without rupture, unspecified: Secondary | ICD-10-CM

## 2020-06-18 DIAGNOSIS — Z7901 Long term (current) use of anticoagulants: Secondary | ICD-10-CM | POA: Insufficient documentation

## 2020-06-18 DIAGNOSIS — Z7982 Long term (current) use of aspirin: Secondary | ICD-10-CM | POA: Insufficient documentation

## 2020-06-18 DIAGNOSIS — I129 Hypertensive chronic kidney disease with stage 1 through stage 4 chronic kidney disease, or unspecified chronic kidney disease: Secondary | ICD-10-CM | POA: Diagnosis not present

## 2020-06-18 DIAGNOSIS — Z6841 Body Mass Index (BMI) 40.0 and over, adult: Secondary | ICD-10-CM | POA: Insufficient documentation

## 2020-06-18 DIAGNOSIS — Z87891 Personal history of nicotine dependence: Secondary | ICD-10-CM | POA: Diagnosis not present

## 2020-06-18 DIAGNOSIS — N189 Chronic kidney disease, unspecified: Secondary | ICD-10-CM | POA: Diagnosis not present

## 2020-06-18 DIAGNOSIS — T82330A Leakage of aortic (bifurcation) graft (replacement), initial encounter: Secondary | ICD-10-CM | POA: Diagnosis not present

## 2020-06-18 DIAGNOSIS — D649 Anemia, unspecified: Secondary | ICD-10-CM | POA: Insufficient documentation

## 2020-06-18 DIAGNOSIS — E669 Obesity, unspecified: Secondary | ICD-10-CM | POA: Insufficient documentation

## 2020-06-18 DIAGNOSIS — Z86718 Personal history of other venous thrombosis and embolism: Secondary | ICD-10-CM | POA: Diagnosis not present

## 2020-06-18 HISTORY — PX: IR ANGIOGRAM PELVIS SELECTIVE OR SUPRASELECTIVE: IMG661

## 2020-06-18 HISTORY — PX: IR ANGIOGRAM SELECTIVE EACH ADDITIONAL VESSEL: IMG667

## 2020-06-18 HISTORY — PX: IR AORTAGRAM ABDOMINAL SERIALOGRAM: IMG636

## 2020-06-18 HISTORY — PX: IR US GUIDE VASC ACCESS RIGHT: IMG2390

## 2020-06-18 LAB — BASIC METABOLIC PANEL
Anion gap: 12 (ref 5–15)
BUN: 24 mg/dL — ABNORMAL HIGH (ref 6–20)
CO2: 29 mmol/L (ref 22–32)
Calcium: 9.3 mg/dL (ref 8.9–10.3)
Chloride: 97 mmol/L — ABNORMAL LOW (ref 98–111)
Creatinine, Ser: 4.78 mg/dL — ABNORMAL HIGH (ref 0.61–1.24)
GFR calc Af Amer: 15 mL/min — ABNORMAL LOW (ref >60)
GFR calc non Af Amer: 13 mL/min — ABNORMAL LOW (ref >60)
Glucose, Bld: 102 mg/dL — ABNORMAL HIGH (ref 70–99)
Potassium: 4.1 mmol/L (ref 3.5–5.1)
Sodium: 138 mmol/L (ref 135–145)

## 2020-06-18 LAB — CBC
HCT: 26.9 % — ABNORMAL LOW (ref 39.0–52.0)
Hemoglobin: 8.3 g/dL — ABNORMAL LOW (ref 13.0–17.0)
MCH: 28.4 pg (ref 26.0–34.0)
MCHC: 30.9 g/dL (ref 30.0–36.0)
MCV: 92.1 fL (ref 80.0–100.0)
Platelets: 278 10*3/uL (ref 150–400)
RBC: 2.92 MIL/uL — ABNORMAL LOW (ref 4.22–5.81)
RDW: 15 % (ref 11.5–15.5)
WBC: 5.9 10*3/uL (ref 4.0–10.5)
nRBC: 0 % (ref 0.0–0.2)

## 2020-06-18 LAB — PROTIME-INR
INR: 1.1 (ref 0.8–1.2)
Prothrombin Time: 13.6 seconds (ref 11.4–15.2)

## 2020-06-18 LAB — D-DIMER, QUANTITATIVE: D-Dimer, Quant: 15.08 ug/mL-FEU — ABNORMAL HIGH (ref 0.00–0.50)

## 2020-06-18 MED ORDER — FENTANYL CITRATE (PF) 100 MCG/2ML IJ SOLN
INTRAMUSCULAR | Status: AC
  Filled 2020-06-18: qty 2

## 2020-06-18 MED ORDER — LIDOCAINE HCL 1 % IJ SOLN
INTRAMUSCULAR | Status: AC
  Filled 2020-06-18: qty 20

## 2020-06-18 MED ORDER — SODIUM CHLORIDE 0.9 % IV SOLN: INTRAVENOUS | Status: DC

## 2020-06-18 MED ORDER — LIDOCAINE HCL (PF) 1 % IJ SOLN
INTRAMUSCULAR | Status: AC | PRN
  Administered 2020-06-18: 10 mL

## 2020-06-18 MED ORDER — HEPARIN SODIUM (PORCINE) 1000 UNIT/ML IJ SOLN
INTRAMUSCULAR | Status: AC
  Filled 2020-06-18: qty 1

## 2020-06-18 MED ORDER — MIDAZOLAM HCL 2 MG/2ML IJ SOLN
INTRAMUSCULAR | Status: AC | PRN
  Administered 2020-06-18: 0.5 mg via INTRAVENOUS
  Administered 2020-06-18: 1 mg via INTRAVENOUS
  Administered 2020-06-18: 0.5 mg via INTRAVENOUS

## 2020-06-18 MED ORDER — HEPARIN SODIUM (PORCINE) 1000 UNIT/ML IJ SOLN
INTRAMUSCULAR | Status: AC | PRN
  Administered 2020-06-18: 6000 [IU] via INTRAVENOUS

## 2020-06-18 MED ORDER — MIDAZOLAM HCL 2 MG/2ML IJ SOLN
INTRAMUSCULAR | Status: AC
  Filled 2020-06-18: qty 2

## 2020-06-18 MED ORDER — HYDRALAZINE HCL 20 MG/ML IJ SOLN
INTRAMUSCULAR | Status: AC | PRN
  Administered 2020-06-18: 10 mg via INTRAVENOUS

## 2020-06-18 MED ORDER — HYDRALAZINE HCL 20 MG/ML IJ SOLN
INTRAMUSCULAR | Status: AC
  Filled 2020-06-18: qty 1

## 2020-06-18 MED ORDER — IODIXANOL 320 MG/ML IV SOLN
100.0000 mL | Freq: Once | INTRAVENOUS | Status: AC | PRN
  Administered 2020-06-18: 15 mL via INTRAVENOUS

## 2020-06-18 MED ORDER — IODIXANOL 320 MG/ML IV SOLN
100.0000 mL | Freq: Once | INTRAVENOUS | Status: AC | PRN
  Administered 2020-06-18: 80 mL via INTRAVENOUS

## 2020-06-18 MED ORDER — IODIXANOL 320 MG/ML IV SOLN
100.0000 mL | Freq: Once | INTRAVENOUS | Status: AC | PRN
  Administered 2020-06-18: 18 mL via INTRAVENOUS

## 2020-06-18 MED ORDER — FENTANYL CITRATE (PF) 100 MCG/2ML IJ SOLN
INTRAMUSCULAR | Status: AC | PRN
  Administered 2020-06-18: 50 ug via INTRAVENOUS
  Administered 2020-06-18 (×2): 25 ug via INTRAVENOUS

## 2020-06-18 NOTE — Telephone Encounter (Signed)
Called and s/w Webb Silversmith at dialysis she states that D-dimer is still pending. Pt is at short stay-d/w Adonis Huguenin she states to fax over the lab order and they will add. Faxed to [phone]

## 2020-06-18 NOTE — Progress Notes (Signed)
Patient and wife was given discharge instructions. Both verbalized understanding. 

## 2020-06-18 NOTE — H&P (Signed)
Chief Complaint: Patient was seen in consultation today for aortogram with embolization/treatment of endoleak at the request of [place]  Referring Physician(s): Corrie Mckusick  Supervising Physician: Corrie Mckusick  Patient Status: [facility] - Out-pt  History of Present Illness: [name] is a 49 y.o. male with hx of AA previously treated with endovascular graft. 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 After consultation with Dr. Earleen Newport on 6/15(see consult note for details), he is now scheduled for angiogram with intent to treat/embolize endoleak. PMHx, meds, labs, imaging, allergies reviewed. Had HD each of the past 2 days, feels fine this am. Feels well, no recent fevers, chills, illness. Has been NPO today as directed.   Past Medical History:  Diagnosis Date  . Abdominal aortic aneurysm ([place])   . Anemia   . Anxiety   . Aortic regurgitation 12/10/08   Echo: mild to mod. AOV regurg,mild AO root dilatation, LA mildly dilated, EF 65%, LV wall thickness markedly increased, small PE  . Chronic kidney disease   . Chronic renal disease    advanced  . Complication of anesthesia    slow to awaken 1 time  . DVT (deep venous thrombosis) ([place])   . H/O aortic dissection 2009   infarenal abdominal aortic dissection  . History of pulmonary embolus (PE) 2010  . Hypertension    dr [name]  . Left ventricular hypertrophy   . Obesity   . Stroke [facility]) 2009   pt says no stroke    Past Surgical History:  Procedure Laterality Date  . ABDOMINAL AORTAGRAM N/A 02/06/2012   Procedure: ABDOMINAL Maxcine Ham;  Surgeon: [name], MD;  Location: [facility] CATH LAB;  Service: Cardiovascular;  Laterality: N/A;  . ABDOMINAL AORTIC ANEURYSM REPAIR  09/25/2012   EVAR  . AORTIC ROOT REPLACEMENT  05/07/2017   s/p valve sparing root replacement with coronary reconstruction along with repair of aortic valve with a 21MM HAART 300 aortic ring annuloplasty at [facility]  .  AV FISTULA PLACEMENT Left 05/19/2015   Procedure: ARTERIOVENOUS (AV) FISTULA CREATION-LEFT RADIOCEPHALIC;  Surgeon: Mal [name], MD;  Location: [place];  Service: Vascular;  Laterality: Left;  . BASCILIC VEIN TRANSPOSITION Left 03/11/2020   Procedure: LEFT ARM ARTERIOVENOUS FISTULA  TRANSPOSITION;  Surgeon: [name], MD;  Location: [place];  Service: Vascular;  Laterality: Left;  . CARDIAC CATHETERIZATION  02/11/10   false + Nuc  . CORNEAL TRANSPLANT Left 1997  . FISTULA SUPERFICIALIZATION Left 08/04/2015   Procedure: FISTULA SUPERFICIALIZATION LEFT RADIAL CEPHALIC;  Surgeon: Mal [name], MD;  Location: [place];  Service: Vascular;  Laterality: Left;  . INTRAVASCULAR ULTRASOUND  09/25/2012   Procedure: INTRAVASCULAR ULTRASOUND;  Surgeon: Mal [name], MD;  Location: [facility] OR;  Service: Vascular;  Laterality: N/A;  . IR RADIOLOGIST EVAL & MGMT  06/08/2020  . LIGATION OF COMPETING BRANCHES OF ARTERIOVENOUS FISTULA Left 08/04/2015   Procedure: LIGATION OF MULTIPLE COMPETING BRANCHES OF ARTERIOVENOUS FISTULA;  Surgeon: Mal [name], MD;  Location: [place];  Service: Vascular;  Laterality: Left;  . RESECTION OF ARTERIOVENOUS FISTULA ANEURYSM Left 01/02/2018   Procedure: RESECTION OF ARTERIOVENOUS FISTULA ANEURYSM LEFT RADIOCEPHALIC;  Surgeon: [name], MD;  Location: [place];  Service: Cardiovascular;  Laterality: Left;    Allergies: Oxycodone  Medications: Prior to Admission medications   Medication Sig Start Date End Date Taking? Authorizing Provider  acetaminophen (TYLENOL) 500 MG tablet Take 1,000 mg by mouth every 6 (six) hours as needed for moderate  pain or headache. Reported on 12/14/2015   Yes [provider]  apixaban (ELIQUIS) 2.5 MG TABS tablet Take 1 tablet (2.5 mg total) by mouth 2 (two) times daily. Patient taking differently: Take 2.5 mg by mouth See admin instructions. Mon, Wed, Fri, take once daily, all other days taking twice daily 04/08/20  Yes Croitoru, Mihai, MD    aspirin 81 MG EC tablet Take 81 mg by mouth See admin instructions. Mon Wed Fri take 1 tablet daily, and Tues and Thurs take 1 tablet twice daily   Yes [provider]  atorvastatin (LIPITOR) 10 MG tablet TAKE 1 TABLET BY MOUTH  DAILY Patient taking differently: Take 10 mg by mouth at bedtime.  04/15/20  Yes Croitoru, Mihai, MD  B Complex-C-Folic Acid (DIALYVITE 008) 0.8 MG TABS Take 1 tablet by mouth daily. 01/14/20  Yes [provider]  carvedilol (COREG) 25 MG tablet Take 0.5 tablets (12.5 mg total) by mouth 2 (two) times daily. If SBP > 120 Patient taking differently: Take 12.5 mg by mouth See admin instructions. If SBP > 120; Mon, Wed, Fri, take once daily, all other days taking twice daily 06/08/20  Yes Croitoru, Dani Gobble, MD  Methoxy PEG-Epoetin Beta (MIRCERA IJ) Inject as directed as needed.  01/26/20 01/24/21 Yes [provider]  venlafaxine XR (EFFEXOR XR) 150 MG 24 hr capsule Take 1 capsule (150 mg total) by mouth daily. 05/04/20  Yes Laurey Morale, MD  zolpidem (AMBIEN) 10 MG tablet Take 1 tablet (10 mg total) by mouth at bedtime as needed for sleep. 05/04/20  Yes Laurey Morale, MD     Family History  Problem Relation Age of Onset  . Hypertension Mother   . Hypertension Father     Social History   Socioeconomic History  . Marital status: Married    Spouse name: Not on file  . Number of children: Not on file  . Years of education: Not on file  . Highest education level: Not on file  Occupational History  . Not on file  Tobacco Use  . Smoking status: Former Smoker    Years: 3.00    Types: Cigarettes    Quit date: 01/25/1991    Years since quitting: 29.4  . Smokeless tobacco: Never Used  Vaping Use  . Vaping Use: Never used  Substance and Sexual Activity  . Alcohol use: No    Alcohol/week: 0.0 standard drinks  . Drug use: No  . Sexual activity: Not Currently    Partners: Female  Other Topics Concern  . Not on file  Social History Narrative  .  Not on file   Social Determinants of Health   Financial Resource Strain:   . Difficulty of Paying Living Expenses:   Food Insecurity:   . Worried About Charity fundraiser in the Last Year:   . Arboriculturist in the Last Year:   Transportation Needs:   . Film/video editor (Medical):   [name] Lack of Transportation (Non-Medical):   Physical Activity:   . Days of Exercise per Week:   . Minutes of Exercise per Session:   Stress:   . Feeling of Stress :   Social Connections:   . Frequency of Communication with Friends and Family:   . Frequency of Social Gatherings with Friends and Family:   . Attends Religious Services:   . Active Member of Clubs or Organizations:   . Attends Archivist Meetings:   [name] Marital Status:  Review of Systems: A 12 point ROS discussed and pertinent positives are indicated in the HPI above.  All other systems are negative.  Review of Systems  Vital Signs: BP (!) 156/104   Pulse 99   Temp 98.3 F (36.8 C) (Oral)   Resp 17   Ht 6' (1.829 m)   Wt (!) 147.9 kg   SpO2 99%   BMI 44.21 kg/m   Physical Exam Constitutional:      Appearance: Normal appearance. He is obese. He is not ill-appearing.  HENT:     Mouth/Throat:     Mouth: Mucous membranes are moist.     Pharynx: Oropharynx is clear.  Cardiovascular:     Rate and Rhythm: Normal rate and regular rhythm.     Pulses: Normal pulses.     Heart sounds: Normal heart sounds.  Pulmonary:     Effort: Pulmonary effort is normal. No respiratory distress.     Breath sounds: Normal breath sounds.  Musculoskeletal:     Comments: (L)UE AVF palpable, good thrill  Skin:    General: Skin is warm and dry.  Neurological:     General: No focal deficit present.     Mental Status: He is alert and oriented to person, place, and time.  Psychiatric:        Mood and Affect: Mood normal.        Thought Content: Thought content normal.        Judgment: Judgment normal.     Imaging: CT Head  Wo Contrast  Result Date: 06/07/2020 CLINICAL DATA:  Acute headache with normal neuro exam. Lightheaded and dizzy at dialysis. Three syncopal episodes today. EXAM: CT HEAD WITHOUT CONTRAST TECHNIQUE: Contiguous axial images were obtained from the base of the skull through the vertex without intravenous contrast. COMPARISON:  None. FINDINGS: Brain: No intracranial hemorrhage, mass effect, or midline shift. No hydrocephalus. The basilar cisterns are patent. No evidence of territorial infarct or acute ischemia. No extra-axial or intracranial fluid collection. Vascular: No hyperdense vessel or unexpected calcification. Skull: No fracture or focal lesion.  Frontal hyperostosis Sinuses/Orbits: Paranasal sinuses and mastoid air cells are clear. The visualized orbits are unremarkable. Other: None. IMPRESSION: Negative noncontrast head CT. Electronically Signed   By: Keith Rake M.D.   On: 06/07/2020 19:52   DG Chest Portable 1 View  Result Date: 06/07/2020 CLINICAL DATA:  Near syncope while on dialysis. Chest pain and shortness of breath. EXAM: PORTABLE CHEST 1 VIEW COMPARISON:  06/03/2020 FINDINGS: Previous median sternotomy and CABG procedure. Cardiac enlargement. Right chest wall dialysis catheter is noted with tip at the cavoatrial junction. Decreased lung volumes with asymmetric elevation of right hemidiaphragm, unchanged. IMPRESSION: No acute cardiopulmonary abnormalities. Electronically Signed   By: Kerby Moors M.D.   On: 06/07/2020 18:45   CT ANGIO CHEST AORTA W/CM & OR WO/CM  Result Date: 06/04/2020 CLINICAL DATA:  Status post EVAR to treat abdominal aortic dissection with aneurysm formation in 2013. Status post aortic root replacement with repair of aortic valve in 2018 for aneurysmal disease of the aortic root. EXAM: CT ANGIOGRAPHY CHEST, ABDOMEN AND PELVIS TECHNIQUE: Non-contrast CT of the chest was initially obtained. Multidetector CT imaging through the chest, abdomen and pelvis was performed  using the standard protocol during bolus administration of intravenous contrast. Multiplanar reconstructed images and MIPs were obtained and reviewed to evaluate the vascular anatomy. CONTRAST:  10mL ISOVUE-370 IOPAMIDOL (ISOVUE-370) INJECTION 76% COMPARISON:  CT of the abdomen and pelvis on 10/29/2017, MRA of the chest  on 01/30/2017 FINDINGS: CTA CHEST FINDINGS Cardiovascular: After aortic root replacement, there is some residual dilatation of the proximal root measuring up to 4.9-5.0 cm primarily due to some dilatation of the left posterior aortic sinus. The ascending aortic graft is normally patent and demonstrates no evidence anastomotic pseudoaneurysm. The aortic arch measures 2.9-3.3 cm. The descending thoracic aorta measures 2.8 cm. No evidence of aortic dissection. Visualized proximal great vessels demonstrate normal patency. The heart size is normal. No pericardial fluid identified. No significant calcified coronary artery plaque with suggestion of minimal plaque in the distribution of the RCA. Tunneled hemodialysis catheter present via the lower right internal jugular vein with catheter tip just below the SVC/RA junction. Central pulmonary arteries are normal in caliber. Mediastinum/Nodes: No enlarged mediastinal, hilar, or axillary lymph nodes. Thyroid gland, trachea, and esophagus demonstrate no significant findings. Lungs/Pleura: There is no evidence of pulmonary edema, consolidation, pneumothorax, nodule or pleural fluid. Musculoskeletal: No chest wall abnormality. No acute or significant osseous findings. Review of the MIP images confirms the above findings. CTA ABDOMEN AND PELVIS FINDINGS VASCULAR Aorta: Aortic endograft demonstrates stable positioning since prior unenhanced studies without evidence of migration or angulation. Proximal aortic and bilateral common iliac limb apposition appears intact and stable. There is significant enlargement of the surrounding aneurysm sac since the prior  unenhanced CT in 2018 with maximal sac dimensions of 5.9 x 6.9 cm. When remeasured ring with similar axis on the prior exam in 2018, the sac measures 4.2 x 4.7 cm. On the arterial phase, there are some opacified lumbar arteries at the L3, L4 and L5 levels that approach the posterior sac margin. The superior and posterior aspect of the aneurysm sac does demonstrate increase in density from the unenhanced phase to the arterial phase and delayed phase. The more anterior aspect of the aneurysm sac does not demonstrate progressive increase in density. Findings would support the presence of a type II endoleak supplied by one or more lumbar arteries but the actual focal inflow is difficult to visualize by CTA. This is likely partly due to technical factors due to the patient's size and degree of arterial contrast opacification. There is no evidence of a type I endoleak. Celiac: Fusiform dilatation of a normally patent celiac trunk measuring up to 13 mm. Distal branch vessels demonstrate normal patency. SMA: Normally patent. Renals: Normally patent bilateral single renal arteries. IMA: Occluded origin. IMA branches are reconstituted by SMA collaterals. No evidence of visible Inflow: Bilateral iliac arteries demonstrate normal patency. 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 The left iliac endograft limb extends to the common iliac bifurcation. The bifurcation itself measures approximately 2.3 cm. Bilateral internal and external iliac arteries demonstrate normal patency. Bilateral common femoral arteries and femoral bifurcations are normally patent. Veins: Delayed imaging shows no venous abnormalities in the abdomen or pelvis. Review of the MIP images confirms the above findings. NON-VASCULAR Hepatobiliary: Unremarkable liver. Multiple gallstones. No evidence of biliary dilatation. Pancreas: Unremarkable. No pancreatic ductal dilatation or surrounding  inflammatory changes. Spleen: Normal in size without focal abnormality. Adrenals/Urinary Tract: Bilateral atrophic kidneys without evidence of hydronephrosis. There are several bilateral renal cysts. In addition, some hyperdense cortical lesions are identified with the largest measuring 2.4 cm at the level of the lower pole of the right kidney. These may represent hemorrhagic cysts. Some of these may be acquired from hemodialysis. Additional follow-up with CT recommended. Stomach/Bowel: No evidence of bowel obstruction, ileus, lesion, inflammation  or free air. Lymphatic: No enlarged lymph nodes identified. Reproductive: Prostate is unremarkable. Other: No ascites or abnormal fluid collections. Small umbilical hernia containing fat. Musculoskeletal: No acute or significant osseous findings. Review of the MIP images confirms the above findings. IMPRESSION: 1. Status post aortic root replacement with some residual dilatation of the proximal root measuring up to 4.9-5.0 cm. The ascending aortic graft is normally patent and demonstrates no evidence of anastomotic pseudoaneurysm. 2. Significant enlargement of the surrounding aneurysm sac since the prior unenhanced CT in 2018 with maximal sac dimensions of 5.9 x 6.9 cm (remeasured at 4.2 x 4.7 cm on the unenhanced 2018 study). On the arterial phase, there are some opacified lumbar arteries at the L3, L4 and L5 levels that approach the posterior sac. Findings would support the presence of a type II endoleak supplied by one or more lumbar arteries but the actual focal inflow is difficult to visualize by CTA. This is likely partly due to technical factors due to the patient's size and degree of arterial contrast opacification. There is no evidence of type I endoleak. 3. 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 4. Fusiform dilatation of a normally patent celiac trunk measuring up to 13 mm. 5.  Cholelithiasis. 6. Multiple bilateral renal cysts. In addition, some hyperdense cortical lesions are identified with the largest measuring 2.4 cm at the level of the lower pole of the right kidney. These may represent hemorrhagic cysts. Some of these may be acquired from hemodialysis. Additional follow-up with CT recommended. Electronically Signed   By: Aletta Edouard M.D.   On: 06/04/2020 09:40   IR Radiologist Eval & Mgmt  Result Date: 06/08/2020 Please refer to notes tab for details about interventional procedure. (Op Note)  CT ANGIO ABDOMEN PELVIS  W &/OR WO CONTRAST  Result Date: 06/04/2020 CLINICAL DATA:  Status post EVAR to treat abdominal aortic dissection with aneurysm formation in 2013. Status post aortic root replacement with repair of aortic valve in 2018 for aneurysmal disease of the aortic root. EXAM: CT ANGIOGRAPHY CHEST, ABDOMEN AND PELVIS TECHNIQUE: Non-contrast CT of the chest was initially obtained. Multidetector CT imaging through the chest, abdomen and pelvis was performed using the standard protocol during bolus administration of intravenous contrast. Multiplanar reconstructed images and MIPs were obtained and reviewed to evaluate the vascular anatomy. CONTRAST:  52mL ISOVUE-370 IOPAMIDOL (ISOVUE-370) INJECTION 76% COMPARISON:  CT of the abdomen and pelvis on 10/29/2017, MRA of the chest on 01/30/2017 FINDINGS: CTA CHEST FINDINGS Cardiovascular: After aortic root replacement, there is some residual dilatation of the proximal root measuring up to 4.9-5.0 cm primarily due to some dilatation of the left posterior aortic sinus. The ascending aortic graft is normally patent and demonstrates no evidence anastomotic pseudoaneurysm. The aortic arch measures 2.9-3.3 cm. The descending thoracic aorta measures 2.8 cm. No evidence of aortic dissection. Visualized proximal great vessels demonstrate normal patency. The heart size is normal. No pericardial fluid identified. No significant calcified  coronary artery plaque with suggestion of minimal plaque in the distribution of the RCA. Tunneled hemodialysis catheter present via the lower right internal jugular vein with catheter tip just below the SVC/RA junction. Central pulmonary arteries are normal in caliber. Mediastinum/Nodes: No enlarged mediastinal, hilar, or axillary lymph nodes. Thyroid gland, trachea, and esophagus demonstrate no significant findings. Lungs/Pleura: There is no evidence of pulmonary edema, consolidation, pneumothorax, nodule or pleural fluid. Musculoskeletal: No chest wall abnormality. No acute or significant osseous  findings. Review of the MIP images confirms the above findings. CTA ABDOMEN AND PELVIS FINDINGS VASCULAR Aorta: Aortic endograft demonstrates stable positioning since prior unenhanced studies without evidence of migration or angulation. Proximal aortic and bilateral common iliac limb apposition appears intact and stable. There is significant enlargement of the surrounding aneurysm sac since the prior unenhanced CT in 2018 with maximal sac dimensions of 5.9 x 6.9 cm. When remeasured ring with similar axis on the prior exam in 2018, the sac measures 4.2 x 4.7 cm. On the arterial phase, there are some opacified lumbar arteries at the L3, L4 and L5 levels that approach the posterior sac margin. The superior and posterior aspect of the aneurysm sac does demonstrate increase in density from the unenhanced phase to the arterial phase and delayed phase. The more anterior aspect of the aneurysm sac does not demonstrate progressive increase in density. Findings would support the presence of a type II endoleak supplied by one or more lumbar arteries but the actual focal inflow is difficult to visualize by CTA. This is likely partly due to technical factors due to the patient's size and degree of arterial contrast opacification. There is no evidence of a type I endoleak. Celiac: Fusiform dilatation of a normally patent celiac trunk  measuring up to 13 mm. Distal branch vessels demonstrate normal patency. SMA: Normally patent. Renals: Normally patent bilateral single renal arteries. IMA: Occluded origin. IMA branches are reconstituted by SMA collaterals. No evidence of visible Inflow: Bilateral iliac arteries demonstrate normal patency. 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 The left iliac endograft limb extends to the common iliac bifurcation. The bifurcation itself measures approximately 2.3 cm. Bilateral internal and external iliac arteries demonstrate normal patency. Bilateral common femoral arteries and femoral bifurcations are normally patent. Veins: Delayed imaging shows no venous abnormalities in the abdomen or pelvis. Review of the MIP images confirms the above findings. NON-VASCULAR Hepatobiliary: Unremarkable liver. Multiple gallstones. No evidence of biliary dilatation. Pancreas: Unremarkable. No pancreatic ductal dilatation or surrounding inflammatory changes. Spleen: Normal in size without focal abnormality. Adrenals/Urinary Tract: Bilateral atrophic kidneys without evidence of hydronephrosis. There are several bilateral renal cysts. In addition, some hyperdense cortical lesions are identified with the largest measuring 2.4 cm at the level of the lower pole of the right kidney. These may represent hemorrhagic cysts. Some of these may be acquired from hemodialysis. Additional follow-up with CT recommended. Stomach/Bowel: No evidence of bowel obstruction, ileus, lesion, inflammation or free air. Lymphatic: No enlarged lymph nodes identified. Reproductive: Prostate is unremarkable. Other: No ascites or abnormal fluid collections. Small umbilical hernia containing fat. Musculoskeletal: No acute or significant osseous findings. Review of the MIP images confirms the above findings. IMPRESSION: 1. Status post aortic root replacement with some residual dilatation  of the proximal root measuring up to 4.9-5.0 cm. The ascending aortic graft is normally patent and demonstrates no evidence of anastomotic pseudoaneurysm. 2. Significant enlargement of the surrounding aneurysm sac since the prior unenhanced CT in 2018 with maximal sac dimensions of 5.9 x 6.9 cm (remeasured at 4.2 x 4.7 cm on the unenhanced 2018 study). On the arterial phase, there are some opacified lumbar arteries at the L3, L4 and L5 levels that approach the posterior sac. Findings would support the presence of a type II endoleak supplied by one or more lumbar arteries but the actual focal inflow is difficult to visualize by CTA. This is likely partly due to technical factors  due to the patient's size and degree of arterial contrast opacification. There is no evidence of type I endoleak. 3. 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 4. Fusiform dilatation of a normally patent celiac trunk measuring up to 13 mm. 5. Cholelithiasis. 6. Multiple bilateral renal cysts. In addition, some hyperdense cortical lesions are identified with the largest measuring 2.4 cm at the level of the lower pole of the right kidney. These may represent hemorrhagic cysts. Some of these may be acquired from hemodialysis. Additional follow-up with CT recommended. Electronically Signed   By: Aletta Edouard M.D.   On: 06/04/2020 09:40    Labs:  CBC: Recent Labs    12/29/19 1342 03/11/20 0801 06/07/20 1244 06/18/20 0837  WBC  --   --  6.7 5.9  HGB 9.8* 11.6* 9.7* 8.3*  HCT  --  34.0* 31.1* 26.9*  PLT  --   --  159 278    COAGS: Recent Labs    06/18/20 0837  INR 1.1    BMP: Recent Labs    03/11/20 0801 06/07/20 1244 06/18/20 0837  NA 139 137 138  K 2.9* 4.1 4.1  CL 96* 103 97*  CO2  --  21* 29  GLUCOSE 107* 100* 102*  BUN 15 44* 24*  CALCIUM  --  9.1 9.3  CREATININE 5.10* 10.93* 4.78*  GFRNONAA  --  5* 13*  GFRAA  --  6* 15*     Assessment and Plan: Aortic aneurysm endoleak. Plan for aortogram with intervention. Risks and benefits of aortogram with endoleak repair were discussed with the patient including, but not limited to bleeding, infection, vascular injury or contrast induced renal failure.  This interventional procedure involves the use of X-rays and because of the nature of the planned procedure, it is possible that we will have prolonged use of X-ray fluoroscopy.  Potential radiation risks to you include (but are not limited to) the following: - A slightly elevated risk for cancer  several years later in life. This risk is typically less than 0.5% percent. This risk is low in comparison to the normal incidence of human cancer, which is 33% for women and 50% for men according to the [place]. - Radiation induced injury can include skin redness, resembling a rash, tissue breakdown / ulcers and hair loss (which can be temporary or permanent).   The likelihood of either of these occurring depends on the difficulty of the procedure and whether you are sensitive to radiation due to previous procedures, disease, or genetic conditions.   IF your procedure requires a prolonged use of radiation, you will be notified and given written instructions for further action.  It is your responsibility to monitor the irradiated area for the 2 weeks following the procedure and to notify your physician if you are concerned that you have suffered a radiation induced injury.    All of the patient's questions were answered, patient is agreeable to proceed.  Consent signed and in chart.    Thank you for this interesting consult.  I greatly enjoyed meeting MACAULAY REICHER and look forward to participating in their care.  A copy of this report was sent to the requesting provider on this date.  Electronically Signed: Ascencion Dike, PA-C 06/18/2020, 9:50 AM   I spent a total of 20 minutes in face to face  in clinical consultation, greater than 50% of which was counseling/coordinating care for aortogram

## 2020-06-18 NOTE — Discharge Instructions (Signed)
Femoral Site Care This sheet gives you information about how to care for yourself after your procedure. Your health care provider may also give you more specific instructions. If you have problems or questions, contact your health care provider. What can I expect after the procedure? After the procedure, it is common to have:  Bruising that usually fades within 1-2 weeks.  Tenderness at the site. Follow these instructions at home: Wound care  Follow instructions from your health care provider about how to take care of your insertion site. Make sure you: ? Wash your hands with soap and water before you change your bandage (dressing). If soap and water are not available, use hand sanitizer. ? Change your dressing as told by your health care provider. ? Leave stitches (sutures), skin glue, or adhesive strips in place. These skin closures may need to stay in place for 2 weeks or longer. If adhesive strip edges start to loosen and curl up, you may trim the loose edges. Do not remove adhesive strips completely unless your health care provider tells you to do that.  Do not take baths, swim, or use a hot tub until your health care provider approves.  You may shower 24-48 hours after the procedure or as told by your health care provider. ? Gently wash the site with plain soap and water. ? Pat the area dry with a clean towel. ? Do not rub the site. This may cause bleeding.  Do not apply powder or lotion to the site. Keep the site clean and dry.  Check your femoral site every day for signs of infection. Check for: ? Redness, swelling, or pain. ? Fluid or blood. ? Warmth. ? Pus or a bad smell. Activity  For the first 2-3 days after your procedure, or as long as directed: ? Avoid climbing stairs as much as possible. ? Do not squat.  Do not lift anything that is heavier than 10 lb (4.5 kg), or the limit that you are told, until your health care provider says that it is safe.  Rest as  directed. ? Avoid sitting for a long time without moving. Get up to take short walks every 1-2 hours.  Do not drive for 24 hours if you were given a medicine to help you relax (sedative). General instructions  Take over-the-counter and prescription medicines only as told by your health care provider.  Keep all follow-up visits as told by your health care provider. This is important. Contact a health care provider if you have:  A fever or chills.  You have redness, swelling, or pain around your insertion site. Get help right away if:  The catheter insertion area swells very fast.  You pass out.  You suddenly start to sweat or your skin gets clammy.  The catheter insertion area is bleeding, and the bleeding does not stop when you hold steady pressure on the area.  The area near or just beyond the catheter insertion site becomes pale, cool, tingly, or numb. These symptoms may represent a serious problem that is an emergency. Do not wait to see if the symptoms will go away. Get medical help right away. Call your local emergency services (911 in the [place] Do not drive yourself to the hospital. Summary  After the procedure, it is common to have bruising that usually fades within 1-2 weeks.  Check your femoral site every day for signs of infection.  Do not lift anything that is heavier than 10 lb (4.5 kg), or the   limit that you are told, until your health care provider says that it is safe. This information is not intended to replace advice given to you by your health care provider. Make sure you discuss any questions you have with your health care provider. Document Revised: 12/24/2017 Document Reviewed: 12/24/2017 Elsevier Patient Education  2020 [employer]  

## 2020-06-18 NOTE — Telephone Encounter (Signed)
Patient returning Sylvester call.

## 2020-06-18 NOTE — Telephone Encounter (Signed)
Patient contacted 06/18/2020 12:40 PM about D-dimer result.  Left detailed message about probable pulmonary embolus.  Also contacted Dr. Loletha Grayer and reviewed result.  Reviewed risks of missing medication and reinforced the need to not miss any doses/skip doses of Eliquis for PE prevention.  Instructed to call back with any questions or concerns.

## 2020-06-18 NOTE — Procedures (Addendum)
Interventional Radiology Procedure Note  Procedure:   US guided access right CFA Angiogram Attempt at superselective catheter position of iliolumbar artery supplying the endoleak = failed.  Manual pressure for hemostasis  Complications: None  Recommendations:  - Right hip straight x 4 hrs - dialysis Monday is scheduled - Routine wound care - Will follow up in clinic in 1-2 weeks to discuss trans-lumbar/alternative approach - Do not submerge for 7 days   Signed,  Dulcy Fanny. Earleen Newport, DO

## 2020-06-18 NOTE — Telephone Encounter (Signed)
Spoke with [name], he reports he does not take eliquis on dialysis days because he was told it isd taken right out of his system. Discussed with dr Percival Spanish, he advised the patient to take the eliquis twice daily everyday. Will forward this information to jesse. Patient reports he just had surgery so he feels that is why the number is high.

## 2020-06-18 NOTE — Sedation Documentation (Signed)
Patient transported to short stay via stretcher. Vitals WNL. Groin site assessed with Lorenda Peck. Site in pink, warm and dry and soft to palpation. No hematoma noted. Intact distal DP and PT pulses.

## 2020-06-18 NOTE — Sedation Documentation (Addendum)
1320-5 fr exoseal failed

## 2020-06-21 DIAGNOSIS — R7989 Other specified abnormal findings of blood chemistry: Secondary | ICD-10-CM | POA: Diagnosis not present

## 2020-06-21 DIAGNOSIS — D689 Coagulation defect, unspecified: Secondary | ICD-10-CM | POA: Diagnosis not present

## 2020-06-21 DIAGNOSIS — T8249XD Other complication of vascular dialysis catheter, subsequent encounter: Secondary | ICD-10-CM | POA: Diagnosis not present

## 2020-06-21 DIAGNOSIS — Z992 Dependence on renal dialysis: Secondary | ICD-10-CM | POA: Diagnosis not present

## 2020-06-21 DIAGNOSIS — N2581 Secondary hyperparathyroidism of renal origin: Secondary | ICD-10-CM | POA: Diagnosis not present

## 2020-06-21 DIAGNOSIS — N186 End stage renal disease: Secondary | ICD-10-CM | POA: Diagnosis not present

## 2020-06-21 DIAGNOSIS — D509 Iron deficiency anemia, unspecified: Secondary | ICD-10-CM | POA: Diagnosis not present

## 2020-06-21 DIAGNOSIS — D631 Anemia in chronic kidney disease: Secondary | ICD-10-CM | POA: Diagnosis not present

## 2020-06-21 NOTE — Telephone Encounter (Signed)
[name], I fully agree that he should take the Eliquis every day twice daily regardless of dialysis. I am confused about the "clearance": is there another planned procedure, other than the angiogram that has already been completed? It's important to avoid interruptions in his anticoagulation, especially the next 3 months. He had abrupt onset of dizziness, with documented severe hypotension, NOT due to dialysis. That is why I worry that he had a PE. It appears he has an appt with me this week. Will discuss in more detail.

## 2020-06-21 NOTE — Telephone Encounter (Addendum)
I spoke with the patient and his wife. His wife says the procedure was unsuccessful at identifying/treating the endoleak and she was dissatisfied with the provider that did the procedure on Friday. She says she reached out to Dr. Trula Slade to get a referral to [facility], therefore it sounds like this still needs to be addressed at some point. He did hold his Eliquis in preparation for his angiogram - he had received the d-dimer results after the procedure so has since been back on Eliquis twice a day as instructed. They are grateful for appointment on Wednesday and will see you then! I'll remove this from the pre-op box.

## 2020-06-21 NOTE — Telephone Encounter (Signed)
I will forward clearance notes to Dr. Sallyanne Kuster for upcoming appt 06/23/20. I will send FYI to requesting office pt has appt with cardiologist 06/23/20 for pre op assessment. I will remove from the pre op call back pool.

## 2020-06-21 NOTE — Telephone Encounter (Addendum)
   Primary Cardiologist: Sanda Klein, MD  Chart reviewed as part of pre-operative protocol coverage.  Prior notes reviewed. Original clearance sent 06/09/20. Notes indicate indicated a plan for d-dimer based on symptom of dizziness. There appear to have been difficulties facilitating getting this done at [facility]. It resulted on 06/18/20, elevated at 15. Per separate phone note 06/18/20 by [name], NP, "Patient contacted 06/18/2020 12:40 PM about D-dimer result.  Left detailed message about probable pulmonary embolus.  Also contacted Dr. Loletha Grayer and reviewed result.  Reviewed risks of missing medication and reinforced the need to not miss any doses/skip doses of Eliquis for PE prevention.  Instructed to call back with any questions or concerns." Hilda Blades Mathis's note indicates "Spoke with pt, he reports he does not take eliquis on dialysis days because he was told it isd taken right out of his system. Discussed with dr Percival Spanish, he advised the patient to take the eliquis twice daily everyday. Will forward this information to jesse. Patient reports he just had surgery so he feels that is why the number is high." Do not see specific recommendations back to anticoagulation/clearance for procedure.   I do see the patient had IR procedure same day (06/18/20) which seems to be the procedure the original clearance was for. Unclear to me if he held the Eliquis for this study. IR report is as follows:  FINDINGS: Ultrasound demonstrates patency of the common femoral artery.  Angiogram of the aortic endograft demonstrates no evidence of a type 3 endoleak. There is no evidence of a type 1 a endoleak or a type 1 B endoleak.  There is clear filling of the right iliolumbar artery, which is the largest caliber artery extending towards the lumbar connections, below the right-sided endograft gait. There is then cross-filling of the lumbar arteries to the left-sided arteries. There is some retrograde filling of the median  sacral artery from the left, although this did not appear to have a large contribution to the endoleak.  The origin of the iliolumbar artery proved to be originating at a common origin it appears from the lateral sacral artery.  IMPRESSION: Status post ultrasound guided access right common femoral artery for aortic and pelvic angiogram, with failed attempt of a super selective catheter position into the right iliolumbar arteries supplying the endoleak.  Signed,  Dulcy Fanny. Dellia Nims, RPVI  [facility]  [facility]   Electronically Signed   By: Corrie Mckusick D.O.   On: 06/18/2020 14:56   I do not see further input from IR whether additional procedures are needed. It is not clear to me what the plan is regarding this patient's follow-up plan is regarding suspected PE - patient does have appointment scheduled 06/23/20 in our office.  Will route to Dr. Sallyanne Kuster and Denyse Amass for input about next steps.  Charlie Pitter, PA-C 06/21/2020, 8:49 AM

## 2020-06-21 NOTE — Telephone Encounter (Signed)
Thanks, [employer]

## 2020-06-23 ENCOUNTER — Ambulatory Visit: Payer: Medicare Other | Admitting: Cardiovascular Disease

## 2020-06-23 ENCOUNTER — Telehealth: Payer: Self-pay | Admitting: *Deleted

## 2020-06-23 DIAGNOSIS — D689 Coagulation defect, unspecified: Secondary | ICD-10-CM | POA: Diagnosis not present

## 2020-06-23 DIAGNOSIS — T8249XD Other complication of vascular dialysis catheter, subsequent encounter: Secondary | ICD-10-CM | POA: Diagnosis not present

## 2020-06-23 DIAGNOSIS — N2581 Secondary hyperparathyroidism of renal origin: Secondary | ICD-10-CM | POA: Diagnosis not present

## 2020-06-23 DIAGNOSIS — R7989 Other specified abnormal findings of blood chemistry: Secondary | ICD-10-CM | POA: Diagnosis not present

## 2020-06-23 DIAGNOSIS — D509 Iron deficiency anemia, unspecified: Secondary | ICD-10-CM | POA: Diagnosis not present

## 2020-06-23 DIAGNOSIS — Z992 Dependence on renal dialysis: Secondary | ICD-10-CM | POA: Diagnosis not present

## 2020-06-23 DIAGNOSIS — D631 Anemia in chronic kidney disease: Secondary | ICD-10-CM | POA: Diagnosis not present

## 2020-06-23 DIAGNOSIS — I129 Hypertensive chronic kidney disease with stage 1 through stage 4 chronic kidney disease, or unspecified chronic kidney disease: Secondary | ICD-10-CM | POA: Diagnosis not present

## 2020-06-23 DIAGNOSIS — N186 End stage renal disease: Secondary | ICD-10-CM | POA: Diagnosis not present

## 2020-06-23 NOTE — Telephone Encounter (Signed)
The patient cancelled his follow up appointment with Dr. Sallyanne Kuster on 06/23/20.

## 2020-06-24 ENCOUNTER — Other Ambulatory Visit: Payer: Self-pay | Admitting: Interventional Radiology

## 2020-06-24 DIAGNOSIS — IMO0002 Reserved for concepts with insufficient information to code with codable children: Secondary | ICD-10-CM

## 2020-06-25 DIAGNOSIS — N186 End stage renal disease: Secondary | ICD-10-CM | POA: Diagnosis not present

## 2020-06-25 DIAGNOSIS — N2581 Secondary hyperparathyroidism of renal origin: Secondary | ICD-10-CM | POA: Diagnosis not present

## 2020-06-25 DIAGNOSIS — D509 Iron deficiency anemia, unspecified: Secondary | ICD-10-CM | POA: Diagnosis not present

## 2020-06-25 DIAGNOSIS — Z992 Dependence on renal dialysis: Secondary | ICD-10-CM | POA: Diagnosis not present

## 2020-06-25 DIAGNOSIS — D689 Coagulation defect, unspecified: Secondary | ICD-10-CM | POA: Diagnosis not present

## 2020-06-25 DIAGNOSIS — D631 Anemia in chronic kidney disease: Secondary | ICD-10-CM | POA: Diagnosis not present

## 2020-06-28 DIAGNOSIS — D631 Anemia in chronic kidney disease: Secondary | ICD-10-CM | POA: Diagnosis not present

## 2020-06-28 DIAGNOSIS — Z992 Dependence on renal dialysis: Secondary | ICD-10-CM | POA: Diagnosis not present

## 2020-06-28 DIAGNOSIS — D509 Iron deficiency anemia, unspecified: Secondary | ICD-10-CM | POA: Diagnosis not present

## 2020-06-28 DIAGNOSIS — N2581 Secondary hyperparathyroidism of renal origin: Secondary | ICD-10-CM | POA: Diagnosis not present

## 2020-06-28 DIAGNOSIS — N186 End stage renal disease: Secondary | ICD-10-CM | POA: Diagnosis not present

## 2020-06-28 DIAGNOSIS — D689 Coagulation defect, unspecified: Secondary | ICD-10-CM | POA: Diagnosis not present

## 2020-06-28 NOTE — Progress Notes (Signed)
Cardiology Clinic Note   Patient Name: [name] Date of Encounter: 06/29/2020  Primary Care Provider:  Laurey Morale, MD Primary Cardiologist:  Sanda Klein, MD  Patient Profile    [name] 49 year old male presents the clinic today for follow-up evaluation of his hypertension, and episode of dizziness/severe hypotension.  Past Medical History    Past Medical History:  Diagnosis Date   Abdominal aortic aneurysm (HCC)    Anemia    Anxiety    Aortic regurgitation 12/10/08   Echo: mild to mod. AOV regurg,mild AO root dilatation, LA mildly dilated, EF 65%, LV wall thickness markedly increased, small PE   Chronic kidney disease    Chronic renal disease    advanced   Complication of anesthesia    slow to awaken 1 time   DVT (deep venous thrombosis) ([place])    H/O aortic dissection 2009   infarenal abdominal aortic dissection   History of pulmonary embolus (PE) 2010   Hypertension    dr webb   Left ventricular hypertrophy    Obesity    Stroke [facility]) 2009   pt says no stroke   Past Surgical History:  Procedure Laterality Date   ABDOMINAL AORTAGRAM N/A 02/06/2012   Procedure: ABDOMINAL Maxcine Ham;  Surgeon: [name], MD;  [facility] C[facility] LAB;  Service: Cardiovascular;  Laterality: N/A;   ABDOMINAL AORTIC ANEURYSM REPAIR  09/25/2012   EVAR   AORTIC ROOT REPLACEMENT  05/07/2017   s/p valve sparing root replacement with coronary reconstruction along with repair of aortic valve with a 21MM HAART 300 aortic ring annuloplasty at [place] Left 05/19/2015   Procedure: ARTERIOVENOUS (AV) FISTULA CREATION-LEFT RADIOCEPHALIC;  Surgeon: Mal [name], MD;  Location: [place];  Service: Vascular;  Laterality: Left;   [place] Left 03/11/2020   Procedure: LEFT ARM ARTERIOVENOUS FISTULA  TRANSPOSITION;  Surgeon: [name], MD;  Location: [place];  Service: Vascular;  Laterality: Left;   CARDIAC  CATHETERIZATION  02/11/10   false + Nuc   CORNEAL TRANSPLANT Left 1997   FISTULA SUPERFICIALIZATION Left 08/04/2015   Procedure: FISTULA SUPERFICIALIZATION LEFT RADIAL CEPHALIC;  Surgeon: Mal [name], MD;  Location: [place];  Service: Vascular;  Laterality: Left;   INTRAVASCULAR ULTRASOUND  09/25/2012   Procedure: INTRAVASCULAR ULTRASOUND;  Surgeon: Mal [name], MD;  Location: [place];  Service: Vascular;  Laterality: N/A;   IR ANGIOGRAM PELVIS SELECTIVE OR SUPRASELECTIVE  06/18/2020   IR ANGIOGRAM SELECTIVE EACH ADDITIONAL VESSEL  06/18/2020   IR AORTAGRAM ABDOMINAL SERIALOGRAM  06/18/2020   IR RADIOLOGIST EVAL & MGMT  06/08/2020   IR US GUIDE VASC ACCESS RIGHT  06/18/2020   LIGATION OF COMPETING BRANCHES OF ARTERIOVENOUS FISTULA Left 08/04/2015   Procedure: LIGATION OF MULTIPLE COMPETING BRANCHES OF ARTERIOVENOUS FISTULA;  Surgeon: Mal [name], MD;  Location: [facility] OR;  Service: Vascular;  Laterality: Left;   RESECTION OF ARTERIOVENOUS FISTULA ANEURYSM Left 01/02/2018   Procedure: RESECTION OF ARTERIOVENOUS FISTULA ANEURYSM LEFT RADIOCEPHALIC;  Surgeon: [name], MD;  Location: [place];  Service: Cardiovascular;  Laterality: Left;    Allergies  Allergies  Allergen Reactions   Oxycodone Hives    History of Present Illness    [name] is a past medical history of obesity, severe systemic hypertension, aneurysm of the ascending aorta status post acute valve repair (Dr. Ysidro Evert, [facility] 2018), stent graft repair for abdominal aortic aneurysm and dissection (Dr. Kellie Simmering, 2013), bilateral iliac artery  aneurysms, hypertensive heart disease with severe LVH, and compensated diastolic heart failure.  He also has a history of mild to moderate aortic insufficiency, DVT, end-stage renal disease, left forearm AV fistula ligated with a new left upper arm fistula, and pulmonary embolism (on Eliquis).  On hemodialysis [facility].  Did have some symptoms of fatigue during HD in January.  His dry  weight is 150 kg.  Noted to have some issues with low potassium which were managed by adjusting his diet.  Was still making urine 2 times per day and has had some dizziness after ultrafiltration when his dry weight was exceeded.  Did have issues with Xarelto causing recurrent spontaneous bleeding.  Eliquis had been tolerated well in the past but he did have some trouble paying for the medication.  Appeal was sent to manufacture and his insurance for tier exception.  He was being provided with Eliquis samples.  Is a follow-up cardiac and aortic MRI in October showed severe left ventricular hypertrophy with preserved LV function.  His intra aortic valve repair showed no regurgitation.  No evidence of hemochromatosis was noted.  Suspicion for amyloidosis however gadolinium could not be administered.  His aortic repair was also noted to be intact.  Presented to the ED on 06/07/20 after an episode of hypotension and dizziness in the HD parking lot. EMS was unable to obtain a BP for several minutes and eventually were able to get a BP in the 90/40 range. In the ED his EKG showed sinus rhythm, CT was normal however a follow up with neurology was recommended. Dr. Sallyanne Kuster had concern for PE. A D-Dimer was ordered but was delayed due to the patient requesting to have the lab drawn at HD. His dialysis center also had trouble obtaining the D-Dimer which further delayed his result. D-Dimer resulted on 06/18/20 and was 15.08. He was instructed to follow BID dosing for his Eliquis and expressed understanding.   He presents to the clinic today for follow up evaluation and states after speaking with cardiology clinic he has been taking his Eliquis twice daily.  We discussed the risks of possible fatal PE with medication noncompliance.  He states that initially when he presented to start dialysis he was told to not take his morning medications on the days of dialysis.  Therefore, he was not taking his Eliquis in the mornings  predialysis.  He feels frustrated with his dialysis center.  He has noticed that they are not able to draw labs in some cases like he feels they should and at times pull too much fluid during treatments.  He has noticed that if he gets close to 3 L during his treatments he will begin to cramp and not feel well the next day.  He states that he has a follow-up consultation with [facility] on Thursday to talk about his endoleak.  He states that when Dr. Earleen Newport attempted the procedure he was not able to complete embolization.  He is worried that he will continue to bleed.  I have instructed him to have preoperative clearance form sent after they discuss their plan of care.  We also talked about the need to continue his Eliquis uninterrupted for the next 3 months due to probable PE.  He and his wife agree with this plan and his wife feels that he had the same symptoms with his prior PE that he had with his recent episode.  We also talked about wearing lower extremity support stockings to help with dizziness  surrounding HD.  I have given him salty 6 diet sheet, asked him to monitor his blood pressures and bring a log to his next appointment, and follow-up with Dr. Loletha Grayer in 3 months.  Today he denies chest pain, shortness of breath, lower extremity edema, fatigue, palpitations, melena, hematuria, hemoptysis, diaphoresis, weakness, presyncope, syncope, orthopnea, and PND.   Home Medications    Prior to Admission medications   Medication Sig Start Date End Date Taking? Authorizing Provider  acetaminophen (TYLENOL) 500 MG tablet Take 1,000 mg by mouth every 6 (six) hours as needed for moderate pain or headache. Reported on 12/14/2015    [provider]  apixaban (ELIQUIS) 2.5 MG TABS tablet Take 1 tablet (2.5 mg total) by mouth 2 (two) times daily. Patient taking differently: Take 2.5 mg by mouth See admin instructions. Mon, Wed, Fri, take once daily, all other days taking twice daily 04/08/20   Croitoru, Mihai, MD    aspirin 81 MG EC tablet Take 81 mg by mouth See admin instructions. Mon Wed Fri take 1 tablet daily, and Tues and Thurs take 1 tablet twice daily    [provider]  atorvastatin (LIPITOR) 10 MG tablet TAKE 1 TABLET BY MOUTH  DAILY Patient taking differently: Take 10 mg by mouth at bedtime.  04/15/20   Croitoru, Mihai, MD  B Complex-C-Folic Acid (DIALYVITE 353) 0.8 MG TABS Take 1 tablet by mouth daily. 01/14/20   [provider]  carvedilol (COREG) 25 MG tablet Take 0.5 tablets (12.5 mg total) by mouth 2 (two) times daily. If SBP > 120 Patient taking differently: Take 12.5 mg by mouth See admin instructions. If SBP > 120; Mon, Wed, Fri, take once daily, all other days taking twice daily 06/08/20   Croitoru, Dani Gobble, MD  Methoxy PEG-Epoetin Beta (MIRCERA IJ) Inject as directed as needed.  01/26/20 01/24/21  [provider]  venlafaxine XR (EFFEXOR XR) 150 MG 24 hr capsule Take 1 capsule (150 mg total) by mouth daily. 05/04/20   Laurey Morale, MD  zolpidem (AMBIEN) 10 MG tablet Take 1 tablet (10 mg total) by mouth at bedtime as needed for sleep. 05/04/20   Laurey Morale, MD    Family History    Family History  Problem Relation Age of Onset   Hypertension Mother    Hypertension Father    He indicated that his mother is alive. He indicated that his father is alive. He indicated that his sister is alive. He indicated that all of his three brothers are alive.  Social History    Social History   Socioeconomic History   Marital status: Married    Spouse name: Not on file   Number of children: Not on file   Years of education: Not on file   Highest education level: Not on file  Occupational History   Not on file  Tobacco Use   Smoking status: Former Smoker    Years: 3.00    Types: Cigarettes    Quit date: 01/25/1991    Years since quitting: 29.4   Smokeless tobacco: Never Used  Vaping Use   Vaping Use: Never used  Substance and Sexual Activity   Alcohol  use: No    Alcohol/week: 0.0 standard drinks   Drug use: No   Sexual activity: Not Currently    Partners: Female  Other Topics Concern   Not on file  Social History Narrative   Not on file   Social Determinants of Health   Financial Resource Strain:  Difficulty of Paying Living Expenses:   Food Insecurity:    Worried About Charity fundraiser in the Last Year:    Arboriculturist in the Last Year:   Transportation Needs:    Film/video editor (Medical):    Lack of Transportation (Non-Medical):   Physical Activity:    Days of Exercise per Week:    Minutes of Exercise per Session:   Stress:    Feeling of Stress :   Social Connections:    Frequency of Communication with Friends and Family:    Frequency of Social Gatherings with Friends and Family:    Attends Religious Services:    Active Member of Clubs or Organizations:    Attends Music therapist:    Marital Status:   Intimate Partner Violence:    Fear of Current or Ex-Partner:    Emotionally Abused:    Physically Abused:    Sexually Abused:      Review of Systems    General:  No chills, fever, night sweats or weight changes.  Cardiovascular:  No chest pain, dyspnea on exertion, edema, orthopnea, palpitations, paroxysmal nocturnal dyspnea. Dermatological: No rash, lesions/masses Respiratory: No cough, dyspnea Urologic: No hematuria, dysuria Abdominal:   No nausea, vomiting, diarrhea, bright red blood per rectum, melena, or hematemesis Neurologic:  No visual changes, wkns, changes in mental status. All other systems reviewed and are otherwise negative except as noted above.  Physical Exam    VS:  BP (!) 156/90 (BP Location: Right Arm, Patient Position: Sitting, Cuff Size: Large)    Pulse 88    Ht 6' (1.829 m)    Wt (!) 327 lb (148.3 kg)    SpO2 99%    BMI 44.35 kg/m  , BMI Body mass index is 44.35 kg/m. GEN: Well nourished, well developed, in no acute distress. HEENT:  normal. Neck: Supple, no JVD, carotid bruits, or masses. Cardiac: RRR, no murmurs, rubs, or gallops. No clubbing, cyanosis, edema.  Radials/DP/PT 2+ and equal bilaterally.  Respiratory:  Respirations regular and unlabored, clear to auscultation bilaterally. GI: Soft, nontender, nondistended, BS + x 4. MS: no deformity or atrophy. Skin: warm and dry, no rash. Neuro:  Strength and sensation are intact. Psych: Normal affect.  Accessory Clinical Findings    ECG personally reviewed by me today-none today.  EKG 06/08/20 Normal sinus rhythm ST and T wave abnormality consider lateral ischemia 91 bpm  Echocardiogram 04/22/2020 IMPRESSIONS    1. Left ventricular ejection fraction, by estimation, is 65 to 70%. The  left ventricle has normal function. The left ventricle has no regional  wall motion abnormalities. There is severe left ventricular hypertrophy.  Left ventricular diastolic parameters  are consistent with Grade I diastolic dysfunction (impaired relaxation).  2. Right ventricular systolic function is normal. The right ventricular  size is normal.  3. Left atrial size was moderately dilated.  4. Right atrial size was mildly dilated.  5. The mitral valve is abnormal. Trivial mitral valve regurgitation.  6. The aortic valve has been repaired/replaced. Aortic valve  regurgitation is mild. There is a 21 mm HAART ring valve present in the  aortic position. Aortic regurgitation PHT measures 435 msec. Aortic valve  mean gradient measures 14.0 mmHg. Aortic valve  Vmax measures 2.57 m/s.  7. Aortic root/ascending aorta has been repaired/replaced. This measures  normal in size.  8. The inferior vena cava is normal in size with greater than 50%  respiratory variability, suggesting right atrial pressure of 3  mmHg.  Assessment & Plan   1.  Dizziness/severe hypotension/elevated D-dimer-no further episodes of dizziness.  Episode on 06/07/2020 believed to be related to pulmonary  embolus.  D-dimer was elevated 15.08.   Continue Eliquis uninterrupted for minimum of 3 months. Continue aspirin Educated about importance of compliance and risks of subsequent PE.  Essential hypertension-BP today 156/90.  States he did not take his medications until about 30 minutes prior to his appointment.  Better controlled at home 116/78 routinely. Continue carvedilol Heart healthy low-sodium diet-salty 6 given Increase physical activity as tolerated  Chronic diastolic CHF-euvolemic today.  Echocardiogram 4/21 showed an EF of 65-70%, severe LVH, G1 DD, moderately dilated left atria, mildly dilated right atria, repaired aortic valve with mild regurgitation, and repaired aortic root. Continue carvedilol Heart healthy low-sodium diet-salty 6 given Increase physical activity as tolerated  End-stage renal disease-HD Monday Wednesday Friday.  No longer having severe episodes of fatigue.  Hyperlipidemia-LDL 84 02/29/2012. Heart healthy low-sodium diet Increase physical activity as tolerated Repeat lipid panel  Ascending aortic aneurysm repair-2018.  Cardiac MRI at [facility] 09/2019 showed reassuring findings.  Abdominal aortic aneurysm-graft repair by Dr. Trula Slade.  CT angio chest 06/03/2020 showed residual dilation of proximal root measuring 4.9-5.0 cm.  Ascending aortic graft patent with no evidence of pseudoaneurysm or dissection. Followed by Dr. Trula Slade  Disposition: Follow-up with Dr. Sallyanne Kuster in 3 months.  Jossie Ng. Nelle Sayed NP-C    06/29/2020, 12:53 PM [address] Office [phone] Fax [phone]

## 2020-06-29 ENCOUNTER — Ambulatory Visit (INDEPENDENT_AMBULATORY_CARE_PROVIDER_SITE_OTHER): Payer: Medicare Other | Admitting: General Practice

## 2020-06-29 ENCOUNTER — Encounter: Payer: Self-pay | Admitting: General Practice

## 2020-06-29 ENCOUNTER — Other Ambulatory Visit: Payer: Self-pay

## 2020-06-29 VITALS — BP 156/90 | HR 88 | Ht 72.0 in | Wt 327.0 lb

## 2020-06-29 DIAGNOSIS — I712 Thoracic aortic aneurysm, without rupture: Secondary | ICD-10-CM

## 2020-06-29 DIAGNOSIS — N186 End stage renal disease: Secondary | ICD-10-CM

## 2020-06-29 DIAGNOSIS — I714 Abdominal aortic aneurysm, without rupture, unspecified: Secondary | ICD-10-CM

## 2020-06-29 DIAGNOSIS — R42 Dizziness and giddiness: Secondary | ICD-10-CM

## 2020-06-29 DIAGNOSIS — I7121 Aneurysm of the ascending aorta, without rupture: Secondary | ICD-10-CM

## 2020-06-29 DIAGNOSIS — I10 Essential (primary) hypertension: Secondary | ICD-10-CM | POA: Diagnosis not present

## 2020-06-29 DIAGNOSIS — I950 Idiopathic hypotension: Secondary | ICD-10-CM | POA: Diagnosis not present

## 2020-06-29 DIAGNOSIS — E7800 Pure hypercholesterolemia, unspecified: Secondary | ICD-10-CM

## 2020-06-29 DIAGNOSIS — I5032 Chronic diastolic (congestive) heart failure: Secondary | ICD-10-CM

## 2020-06-29 DIAGNOSIS — Z992 Dependence on renal dialysis: Secondary | ICD-10-CM

## 2020-06-29 NOTE — Patient Instructions (Signed)
Medication Instructions:  CONTINUE TO TAKE ELIQUIS TWICE DAILY *If you need a refill on your cardiac medications before your next appointment, please call your pharmacy*  Special Instructions TAKE AND LOG YOUR BLOOD PRESSURE AND BRING THIS LOG WITH YOU TO APPOINTMENTS  PLEASE READ AND FOLLOW SALTY 6-ATTACHED  Follow-Up: Your next appointment:  3 month(s)  In Person with Sanda Klein, MD  At [facility], you and your health needs are our priority.  As part of our continuing mission to provide you with exceptional heart care, we have created designated Provider Care Teams.  These Care Teams include your primary Cardiologist (physician) and Advanced Practice Providers (APPs -  Physician Assistants and Nurse Practitioners) who all work together to provide you with the care you need, when you need it.

## 2020-06-30 ENCOUNTER — Telehealth: Payer: Self-pay | Admitting: Family Medicine

## 2020-06-30 DIAGNOSIS — D689 Coagulation defect, unspecified: Secondary | ICD-10-CM | POA: Diagnosis not present

## 2020-06-30 DIAGNOSIS — Z992 Dependence on renal dialysis: Secondary | ICD-10-CM | POA: Diagnosis not present

## 2020-06-30 DIAGNOSIS — N2581 Secondary hyperparathyroidism of renal origin: Secondary | ICD-10-CM | POA: Diagnosis not present

## 2020-06-30 DIAGNOSIS — D500 Iron deficiency anemia secondary to blood loss (chronic): Secondary | ICD-10-CM

## 2020-06-30 DIAGNOSIS — D631 Anemia in chronic kidney disease: Secondary | ICD-10-CM | POA: Diagnosis not present

## 2020-06-30 DIAGNOSIS — D509 Iron deficiency anemia, unspecified: Secondary | ICD-10-CM | POA: Diagnosis not present

## 2020-06-30 DIAGNOSIS — N186 End stage renal disease: Secondary | ICD-10-CM | POA: Diagnosis not present

## 2020-06-30 NOTE — Telephone Encounter (Signed)
Please advise 

## 2020-06-30 NOTE — Telephone Encounter (Signed)
I put in the order for hi to go to the [facility] tomorrow to pick up some stool cards to take home

## 2020-06-30 NOTE — Telephone Encounter (Signed)
Pt stated he needs a stool test per Nephrologist due to hemoglobin being low. Pt is only available Tuesday's and Thursdays and would like to have this done tomorrow if possible.   Pt can be reached at [phone]

## 2020-07-01 NOTE — Telephone Encounter (Signed)
Left message to return phone call.

## 2020-07-02 ENCOUNTER — Telehealth: Payer: Self-pay | Admitting: Cardiovascular Disease

## 2020-07-02 DIAGNOSIS — Z992 Dependence on renal dialysis: Secondary | ICD-10-CM | POA: Diagnosis not present

## 2020-07-02 DIAGNOSIS — D509 Iron deficiency anemia, unspecified: Secondary | ICD-10-CM | POA: Diagnosis not present

## 2020-07-02 DIAGNOSIS — N186 End stage renal disease: Secondary | ICD-10-CM | POA: Diagnosis not present

## 2020-07-02 DIAGNOSIS — D689 Coagulation defect, unspecified: Secondary | ICD-10-CM | POA: Diagnosis not present

## 2020-07-02 DIAGNOSIS — N2581 Secondary hyperparathyroidism of renal origin: Secondary | ICD-10-CM | POA: Diagnosis not present

## 2020-07-02 DIAGNOSIS — D631 Anemia in chronic kidney disease: Secondary | ICD-10-CM | POA: Diagnosis not present

## 2020-07-02 MED ORDER — AMLODIPINE BESYLATE 5 MG PO TABS
5.0000 mg | ORAL_TABLET | Freq: Every day | ORAL | 3 refills | Status: DC
Start: 2020-07-02 — End: 2021-09-14

## 2020-07-02 NOTE — Telephone Encounter (Signed)
New Message  Pt c/o BP issue: STAT if pt c/o blurred vision, one-sided weakness or slurred speech  1. What are your last 5 BP readings? 178/95  2. Are you having any other symptoms (ex. Dizziness, headache, blurred vision, passed out)? Dizziness, headaches,   3. What is your BP issue? Bp elevated

## 2020-07-02 NOTE — Telephone Encounter (Signed)
Patient wife notified of update and verbalized understanding. Pt wife wanted to know if Dr.fry will follow the card results or if the nephrologist will. Pt wife is hoping Dr.Fry will.

## 2020-07-02 NOTE — Telephone Encounter (Signed)
Returned the call to the patient. He stated that his blood pressure has been running high since the Amlodipine was discontinued. It was previously discontinued due to hypotension and Carvedilol was reduced to 12.5 mg twice daily.   The patient increased the Carvedilol to 25 mg twice daily but still has been having high blood pressures in the 511-021'R systolic. He has been having headaches with the high blood pressures.   Per Dr. Sallyanne Kuster, the patient may start Amlodipine 5 mg once daily. The patient has verbalized his understanding. He stated that he has some at home and will try to split them.

## 2020-07-05 DIAGNOSIS — D509 Iron deficiency anemia, unspecified: Secondary | ICD-10-CM | POA: Diagnosis not present

## 2020-07-05 DIAGNOSIS — D631 Anemia in chronic kidney disease: Secondary | ICD-10-CM | POA: Diagnosis not present

## 2020-07-05 DIAGNOSIS — Z992 Dependence on renal dialysis: Secondary | ICD-10-CM | POA: Diagnosis not present

## 2020-07-05 DIAGNOSIS — N2581 Secondary hyperparathyroidism of renal origin: Secondary | ICD-10-CM | POA: Diagnosis not present

## 2020-07-05 DIAGNOSIS — D689 Coagulation defect, unspecified: Secondary | ICD-10-CM | POA: Diagnosis not present

## 2020-07-05 DIAGNOSIS — N186 End stage renal disease: Secondary | ICD-10-CM | POA: Diagnosis not present

## 2020-07-05 NOTE — Telephone Encounter (Signed)
The results will come to me, so I will be following them

## 2020-07-05 NOTE — Telephone Encounter (Signed)
Tried to call pt spouse back but no answer.

## 2020-07-05 NOTE — Telephone Encounter (Signed)
Noted  

## 2020-07-06 DIAGNOSIS — I9789 Other postprocedural complications and disorders of the circulatory system, not elsewhere classified: Secondary | ICD-10-CM | POA: Diagnosis not present

## 2020-07-07 DIAGNOSIS — D509 Iron deficiency anemia, unspecified: Secondary | ICD-10-CM | POA: Diagnosis not present

## 2020-07-07 DIAGNOSIS — N186 End stage renal disease: Secondary | ICD-10-CM | POA: Diagnosis not present

## 2020-07-07 DIAGNOSIS — D689 Coagulation defect, unspecified: Secondary | ICD-10-CM | POA: Diagnosis not present

## 2020-07-07 DIAGNOSIS — N2581 Secondary hyperparathyroidism of renal origin: Secondary | ICD-10-CM | POA: Diagnosis not present

## 2020-07-07 DIAGNOSIS — Z992 Dependence on renal dialysis: Secondary | ICD-10-CM | POA: Diagnosis not present

## 2020-07-07 DIAGNOSIS — D631 Anemia in chronic kidney disease: Secondary | ICD-10-CM | POA: Diagnosis not present

## 2020-07-09 DIAGNOSIS — N186 End stage renal disease: Secondary | ICD-10-CM | POA: Diagnosis not present

## 2020-07-09 DIAGNOSIS — N2581 Secondary hyperparathyroidism of renal origin: Secondary | ICD-10-CM | POA: Diagnosis not present

## 2020-07-09 DIAGNOSIS — D689 Coagulation defect, unspecified: Secondary | ICD-10-CM | POA: Diagnosis not present

## 2020-07-09 DIAGNOSIS — D631 Anemia in chronic kidney disease: Secondary | ICD-10-CM | POA: Diagnosis not present

## 2020-07-09 DIAGNOSIS — D509 Iron deficiency anemia, unspecified: Secondary | ICD-10-CM | POA: Diagnosis not present

## 2020-07-09 DIAGNOSIS — Z992 Dependence on renal dialysis: Secondary | ICD-10-CM | POA: Diagnosis not present

## 2020-07-12 DIAGNOSIS — N2581 Secondary hyperparathyroidism of renal origin: Secondary | ICD-10-CM | POA: Diagnosis not present

## 2020-07-12 DIAGNOSIS — D689 Coagulation defect, unspecified: Secondary | ICD-10-CM | POA: Diagnosis not present

## 2020-07-12 DIAGNOSIS — N186 End stage renal disease: Secondary | ICD-10-CM | POA: Diagnosis not present

## 2020-07-12 DIAGNOSIS — D631 Anemia in chronic kidney disease: Secondary | ICD-10-CM | POA: Diagnosis not present

## 2020-07-12 DIAGNOSIS — Z992 Dependence on renal dialysis: Secondary | ICD-10-CM | POA: Diagnosis not present

## 2020-07-12 DIAGNOSIS — D509 Iron deficiency anemia, unspecified: Secondary | ICD-10-CM | POA: Diagnosis not present

## 2020-07-14 DIAGNOSIS — Z992 Dependence on renal dialysis: Secondary | ICD-10-CM | POA: Diagnosis not present

## 2020-07-14 DIAGNOSIS — N2581 Secondary hyperparathyroidism of renal origin: Secondary | ICD-10-CM | POA: Diagnosis not present

## 2020-07-14 DIAGNOSIS — D689 Coagulation defect, unspecified: Secondary | ICD-10-CM | POA: Diagnosis not present

## 2020-07-14 DIAGNOSIS — D631 Anemia in chronic kidney disease: Secondary | ICD-10-CM | POA: Diagnosis not present

## 2020-07-14 DIAGNOSIS — N186 End stage renal disease: Secondary | ICD-10-CM | POA: Diagnosis not present

## 2020-07-14 DIAGNOSIS — D509 Iron deficiency anemia, unspecified: Secondary | ICD-10-CM | POA: Diagnosis not present

## 2020-07-16 DIAGNOSIS — D509 Iron deficiency anemia, unspecified: Secondary | ICD-10-CM | POA: Diagnosis not present

## 2020-07-16 DIAGNOSIS — N186 End stage renal disease: Secondary | ICD-10-CM | POA: Diagnosis not present

## 2020-07-16 DIAGNOSIS — N2581 Secondary hyperparathyroidism of renal origin: Secondary | ICD-10-CM | POA: Diagnosis not present

## 2020-07-16 DIAGNOSIS — Z992 Dependence on renal dialysis: Secondary | ICD-10-CM | POA: Diagnosis not present

## 2020-07-16 DIAGNOSIS — D631 Anemia in chronic kidney disease: Secondary | ICD-10-CM | POA: Diagnosis not present

## 2020-07-16 DIAGNOSIS — D689 Coagulation defect, unspecified: Secondary | ICD-10-CM | POA: Diagnosis not present

## 2020-07-19 ENCOUNTER — Telehealth: Payer: Self-pay | Admitting: Cardiovascular Disease

## 2020-07-19 DIAGNOSIS — N2581 Secondary hyperparathyroidism of renal origin: Secondary | ICD-10-CM | POA: Diagnosis not present

## 2020-07-19 DIAGNOSIS — N186 End stage renal disease: Secondary | ICD-10-CM | POA: Diagnosis not present

## 2020-07-19 DIAGNOSIS — D689 Coagulation defect, unspecified: Secondary | ICD-10-CM | POA: Diagnosis not present

## 2020-07-19 DIAGNOSIS — D509 Iron deficiency anemia, unspecified: Secondary | ICD-10-CM | POA: Diagnosis not present

## 2020-07-19 DIAGNOSIS — D631 Anemia in chronic kidney disease: Secondary | ICD-10-CM | POA: Diagnosis not present

## 2020-07-19 DIAGNOSIS — Z992 Dependence on renal dialysis: Secondary | ICD-10-CM | POA: Diagnosis not present

## 2020-07-19 NOTE — Telephone Encounter (Signed)
Follow Up:    Wife called you back, she said pt need some samples of Eliquis please.

## 2020-07-19 NOTE — Telephone Encounter (Signed)
Returned call to wife-no eliquis available at this time.  States they have applied for patient assistance multiple times and he gets denied.

## 2020-07-19 NOTE — Telephone Encounter (Signed)
Patient's wife, [name], is requesting to speak with a nurse regarding patient's eliquis, she refused to give any more information.

## 2020-07-19 NOTE — Telephone Encounter (Signed)
Left message to call back  

## 2020-07-20 ENCOUNTER — Telehealth: Payer: Self-pay | Admitting: *Deleted

## 2020-07-20 ENCOUNTER — Encounter: Payer: Self-pay | Admitting: Cardiovascular Disease

## 2020-07-20 ENCOUNTER — Telehealth: Payer: Self-pay

## 2020-07-20 NOTE — Telephone Encounter (Signed)
See other encounter.

## 2020-07-20 NOTE — Telephone Encounter (Signed)
Wyonia Hough, LPN, this is a pt of Dr. Sallyanne Kuster, but pt is getting samples from [facility] because [facility] does not have any I guess. When I called the pt that wife stated that they have applied for pt assistance and keep getting denied, but the pt and the wife is on disability and even though they got denied, wife stated that they were not told why they were denied. Can you please look into this for me? Thanks

## 2020-07-20 NOTE — Telephone Encounter (Signed)
I am not aware of any of the programs which are allowed to receive the medication for free.  Is it the $45 a month co-pay that is too much?

## 2020-07-20 NOTE — Telephone Encounter (Signed)
Samples left at the front office for the pt per the request of the [facility] office..   Eliquis 2.5 mg Lot [id number] Exp 06/2021 #28.

## 2020-07-20 NOTE — Telephone Encounter (Signed)
Duplicate

## 2020-07-20 NOTE — Telephone Encounter (Signed)
Stephani Police, RN      07/20/20 12:53 PM Note Samples left at the front office for the pt per the request of the [facility] office..    Eliquis 2.5 mg Lot [id number] Exp 06/2021 #28.

## 2020-07-20 NOTE — Telephone Encounter (Signed)
Called the patient about retrying for eliquis assistance. He was denied 05/2019 due to it being covered by insurance. They stated that they cannot afford a copayment at this point because the wife and the patient are both on disability so they are hesitant to try again.   Samples have been left for the patient:  Drug name: Eliquis       Strength: 2.5 mg bid        Qty: 4 boxes  LOT: [id number]  Exp.Date: 06/2021

## 2020-07-20 NOTE — Telephone Encounter (Signed)
He is on Medicare, so he will not qualify for co-pay card.   Do they have Medicare plus Medicaid?  Otherwise, they will qualify for patient assistance  if the make $52,260 for a family of two.

## 2020-07-20 NOTE — Telephone Encounter (Signed)
Wife of the patient wanted to know if our [address] office has any samples of Eliquis.

## 2020-07-21 DIAGNOSIS — Z992 Dependence on renal dialysis: Secondary | ICD-10-CM | POA: Diagnosis not present

## 2020-07-21 DIAGNOSIS — N186 End stage renal disease: Secondary | ICD-10-CM | POA: Diagnosis not present

## 2020-07-21 DIAGNOSIS — D689 Coagulation defect, unspecified: Secondary | ICD-10-CM | POA: Diagnosis not present

## 2020-07-21 DIAGNOSIS — D631 Anemia in chronic kidney disease: Secondary | ICD-10-CM | POA: Diagnosis not present

## 2020-07-21 DIAGNOSIS — N2581 Secondary hyperparathyroidism of renal origin: Secondary | ICD-10-CM | POA: Diagnosis not present

## 2020-07-21 DIAGNOSIS — D509 Iron deficiency anemia, unspecified: Secondary | ICD-10-CM | POA: Diagnosis not present

## 2020-07-23 DIAGNOSIS — Z992 Dependence on renal dialysis: Secondary | ICD-10-CM | POA: Diagnosis not present

## 2020-07-23 DIAGNOSIS — D689 Coagulation defect, unspecified: Secondary | ICD-10-CM | POA: Diagnosis not present

## 2020-07-23 DIAGNOSIS — D631 Anemia in chronic kidney disease: Secondary | ICD-10-CM | POA: Diagnosis not present

## 2020-07-23 DIAGNOSIS — D509 Iron deficiency anemia, unspecified: Secondary | ICD-10-CM | POA: Diagnosis not present

## 2020-07-23 DIAGNOSIS — N186 End stage renal disease: Secondary | ICD-10-CM | POA: Diagnosis not present

## 2020-07-23 DIAGNOSIS — N2581 Secondary hyperparathyroidism of renal origin: Secondary | ICD-10-CM | POA: Diagnosis not present

## 2020-07-24 DIAGNOSIS — N186 End stage renal disease: Secondary | ICD-10-CM | POA: Diagnosis not present

## 2020-07-24 DIAGNOSIS — I129 Hypertensive chronic kidney disease with stage 1 through stage 4 chronic kidney disease, or unspecified chronic kidney disease: Secondary | ICD-10-CM | POA: Diagnosis not present

## 2020-07-24 DIAGNOSIS — Z992 Dependence on renal dialysis: Secondary | ICD-10-CM | POA: Diagnosis not present

## 2020-07-26 DIAGNOSIS — D631 Anemia in chronic kidney disease: Secondary | ICD-10-CM | POA: Diagnosis not present

## 2020-07-26 DIAGNOSIS — N2581 Secondary hyperparathyroidism of renal origin: Secondary | ICD-10-CM | POA: Diagnosis not present

## 2020-07-26 DIAGNOSIS — Z992 Dependence on renal dialysis: Secondary | ICD-10-CM | POA: Diagnosis not present

## 2020-07-26 DIAGNOSIS — D509 Iron deficiency anemia, unspecified: Secondary | ICD-10-CM | POA: Diagnosis not present

## 2020-07-26 DIAGNOSIS — N186 End stage renal disease: Secondary | ICD-10-CM | POA: Diagnosis not present

## 2020-07-26 DIAGNOSIS — D689 Coagulation defect, unspecified: Secondary | ICD-10-CM | POA: Diagnosis not present

## 2020-07-28 ENCOUNTER — Telehealth: Payer: Self-pay | Admitting: Cardiovascular Disease

## 2020-07-28 DIAGNOSIS — D631 Anemia in chronic kidney disease: Secondary | ICD-10-CM | POA: Diagnosis not present

## 2020-07-28 DIAGNOSIS — N2581 Secondary hyperparathyroidism of renal origin: Secondary | ICD-10-CM | POA: Diagnosis not present

## 2020-07-28 DIAGNOSIS — D509 Iron deficiency anemia, unspecified: Secondary | ICD-10-CM | POA: Diagnosis not present

## 2020-07-28 DIAGNOSIS — N186 End stage renal disease: Secondary | ICD-10-CM | POA: Diagnosis not present

## 2020-07-28 DIAGNOSIS — Z992 Dependence on renal dialysis: Secondary | ICD-10-CM | POA: Diagnosis not present

## 2020-07-28 DIAGNOSIS — D689 Coagulation defect, unspecified: Secondary | ICD-10-CM | POA: Diagnosis not present

## 2020-07-28 NOTE — Telephone Encounter (Signed)
Patient called stating he was having surgery on Friday at [facility] and wanted to know if it were okay with Dr. Sallyanne Kuster to hold eliquis. Patient made aware that he needs to get a preop clearance form faxed to the office to be filled out. Patient stated his wife would call back to handle this.

## 2020-07-28 NOTE — Telephone Encounter (Signed)
Patient called stating he is having surgery on Friday morning at [facility], they advised him to stop taking Eliquis.  He is calling to make sure Dr. Sallyanne Kuster is ok with that.  He is have a repair of his abdominal aorta done.

## 2020-07-29 DIAGNOSIS — D631 Anemia in chronic kidney disease: Secondary | ICD-10-CM | POA: Diagnosis not present

## 2020-07-29 DIAGNOSIS — N186 End stage renal disease: Secondary | ICD-10-CM | POA: Diagnosis not present

## 2020-07-29 DIAGNOSIS — D509 Iron deficiency anemia, unspecified: Secondary | ICD-10-CM | POA: Diagnosis not present

## 2020-07-29 DIAGNOSIS — N2581 Secondary hyperparathyroidism of renal origin: Secondary | ICD-10-CM | POA: Diagnosis not present

## 2020-07-29 DIAGNOSIS — Z992 Dependence on renal dialysis: Secondary | ICD-10-CM | POA: Diagnosis not present

## 2020-07-29 DIAGNOSIS — D689 Coagulation defect, unspecified: Secondary | ICD-10-CM | POA: Diagnosis not present

## 2020-07-30 DIAGNOSIS — I9789 Other postprocedural complications and disorders of the circulatory system, not elsewhere classified: Secondary | ICD-10-CM | POA: Diagnosis not present

## 2020-07-30 DIAGNOSIS — Z79899 Other long term (current) drug therapy: Secondary | ICD-10-CM | POA: Diagnosis not present

## 2020-07-30 DIAGNOSIS — Z8679 Personal history of other diseases of the circulatory system: Secondary | ICD-10-CM | POA: Diagnosis not present

## 2020-07-30 DIAGNOSIS — I714 Abdominal aortic aneurysm, without rupture: Secondary | ICD-10-CM | POA: Diagnosis not present

## 2020-07-30 DIAGNOSIS — Z95828 Presence of other vascular implants and grafts: Secondary | ICD-10-CM | POA: Diagnosis not present

## 2020-07-30 DIAGNOSIS — Z48812 Encounter for surgical aftercare following surgery on the circulatory system: Secondary | ICD-10-CM | POA: Diagnosis not present

## 2020-08-02 DIAGNOSIS — D689 Coagulation defect, unspecified: Secondary | ICD-10-CM | POA: Diagnosis not present

## 2020-08-02 DIAGNOSIS — D509 Iron deficiency anemia, unspecified: Secondary | ICD-10-CM | POA: Diagnosis not present

## 2020-08-02 DIAGNOSIS — N2581 Secondary hyperparathyroidism of renal origin: Secondary | ICD-10-CM | POA: Diagnosis not present

## 2020-08-02 DIAGNOSIS — D631 Anemia in chronic kidney disease: Secondary | ICD-10-CM | POA: Diagnosis not present

## 2020-08-02 DIAGNOSIS — N186 End stage renal disease: Secondary | ICD-10-CM | POA: Diagnosis not present

## 2020-08-02 DIAGNOSIS — Z992 Dependence on renal dialysis: Secondary | ICD-10-CM | POA: Diagnosis not present

## 2020-08-03 ENCOUNTER — Other Ambulatory Visit: Payer: Self-pay | Admitting: Family Medicine

## 2020-08-04 DIAGNOSIS — D509 Iron deficiency anemia, unspecified: Secondary | ICD-10-CM | POA: Diagnosis not present

## 2020-08-04 DIAGNOSIS — N186 End stage renal disease: Secondary | ICD-10-CM | POA: Diagnosis not present

## 2020-08-04 DIAGNOSIS — D689 Coagulation defect, unspecified: Secondary | ICD-10-CM | POA: Diagnosis not present

## 2020-08-04 DIAGNOSIS — N2581 Secondary hyperparathyroidism of renal origin: Secondary | ICD-10-CM | POA: Diagnosis not present

## 2020-08-04 DIAGNOSIS — D631 Anemia in chronic kidney disease: Secondary | ICD-10-CM | POA: Diagnosis not present

## 2020-08-04 DIAGNOSIS — Z992 Dependence on renal dialysis: Secondary | ICD-10-CM | POA: Diagnosis not present

## 2020-08-04 NOTE — Telephone Encounter (Signed)
Last filled 05/04/2020 Last OV 05/11/2020  Ok to fill?

## 2020-08-06 DIAGNOSIS — D509 Iron deficiency anemia, unspecified: Secondary | ICD-10-CM | POA: Diagnosis not present

## 2020-08-06 DIAGNOSIS — D689 Coagulation defect, unspecified: Secondary | ICD-10-CM | POA: Diagnosis not present

## 2020-08-06 DIAGNOSIS — D631 Anemia in chronic kidney disease: Secondary | ICD-10-CM | POA: Diagnosis not present

## 2020-08-06 DIAGNOSIS — N2581 Secondary hyperparathyroidism of renal origin: Secondary | ICD-10-CM | POA: Diagnosis not present

## 2020-08-06 DIAGNOSIS — Z992 Dependence on renal dialysis: Secondary | ICD-10-CM | POA: Diagnosis not present

## 2020-08-06 DIAGNOSIS — N186 End stage renal disease: Secondary | ICD-10-CM | POA: Diagnosis not present

## 2020-08-09 DIAGNOSIS — D631 Anemia in chronic kidney disease: Secondary | ICD-10-CM | POA: Diagnosis not present

## 2020-08-09 DIAGNOSIS — Z992 Dependence on renal dialysis: Secondary | ICD-10-CM | POA: Diagnosis not present

## 2020-08-09 DIAGNOSIS — N186 End stage renal disease: Secondary | ICD-10-CM | POA: Diagnosis not present

## 2020-08-09 DIAGNOSIS — N2581 Secondary hyperparathyroidism of renal origin: Secondary | ICD-10-CM | POA: Diagnosis not present

## 2020-08-09 DIAGNOSIS — D689 Coagulation defect, unspecified: Secondary | ICD-10-CM | POA: Diagnosis not present

## 2020-08-09 DIAGNOSIS — D509 Iron deficiency anemia, unspecified: Secondary | ICD-10-CM | POA: Diagnosis not present

## 2020-08-10 ENCOUNTER — Ambulatory Visit: Payer: Medicare Other | Admitting: Family Medicine

## 2020-08-11 DIAGNOSIS — D689 Coagulation defect, unspecified: Secondary | ICD-10-CM | POA: Diagnosis not present

## 2020-08-11 DIAGNOSIS — N186 End stage renal disease: Secondary | ICD-10-CM | POA: Diagnosis not present

## 2020-08-11 DIAGNOSIS — N2581 Secondary hyperparathyroidism of renal origin: Secondary | ICD-10-CM | POA: Diagnosis not present

## 2020-08-11 DIAGNOSIS — Z992 Dependence on renal dialysis: Secondary | ICD-10-CM | POA: Diagnosis not present

## 2020-08-11 DIAGNOSIS — D509 Iron deficiency anemia, unspecified: Secondary | ICD-10-CM | POA: Diagnosis not present

## 2020-08-11 DIAGNOSIS — D631 Anemia in chronic kidney disease: Secondary | ICD-10-CM | POA: Diagnosis not present

## 2020-08-12 ENCOUNTER — Other Ambulatory Visit: Payer: Self-pay

## 2020-08-12 ENCOUNTER — Encounter: Payer: Self-pay | Admitting: Family Medicine

## 2020-08-12 ENCOUNTER — Ambulatory Visit (INDEPENDENT_AMBULATORY_CARE_PROVIDER_SITE_OTHER): Payer: Medicare Other | Admitting: Family Medicine

## 2020-08-12 VITALS — BP 140/80 | HR 109 | Temp 98.5°F | Wt 320.2 lb

## 2020-08-12 DIAGNOSIS — N390 Urinary tract infection, site not specified: Secondary | ICD-10-CM | POA: Diagnosis not present

## 2020-08-12 DIAGNOSIS — R300 Dysuria: Secondary | ICD-10-CM

## 2020-08-12 LAB — POCT URINALYSIS DIPSTICK
Bilirubin, UA: NEGATIVE
Blood, UA: POSITIVE
Glucose, UA: NEGATIVE
Ketones, UA: NEGATIVE
Nitrite, UA: NEGATIVE
Protein, UA: POSITIVE — AB
Spec Grav, UA: 1.01 (ref 1.010–1.025)
Urobilinogen, UA: 0.2 E.U./dL
pH, UA: >=9 — AB (ref 5.0–8.0)

## 2020-08-12 MED ORDER — CIPROFLOXACIN HCL 500 MG PO TABS: 500.0000 mg | ORAL_TABLET | Freq: Two times a day (BID) | ORAL | 0 refills | Status: DC

## 2020-08-12 NOTE — Progress Notes (Signed)
   Subjective:    Patient ID: [name], male    DOB: 13-Aug-1971, 50 y.o.   MRN: [id number]  HPI Here for one week of increased urgency to urinate and occasional burning. Sometimes he feels the urge to urinate but cannot. No fever or nausea or back pain. He drinks plenty of water.    Review of Systems  Constitutional: Negative.   Respiratory: Negative.   Cardiovascular: Negative.   Genitourinary: Positive for difficulty urinating, dysuria, frequency and urgency. Negative for flank pain and hematuria.       Objective:   Physical Exam Constitutional:      Appearance: Normal appearance. He is not ill-appearing.  Cardiovascular:     Rate and Rhythm: Normal rate and regular rhythm.     Pulses: Normal pulses.     Heart sounds: Normal heart sounds.  Pulmonary:     Effort: Pulmonary effort is normal.     Breath sounds: Normal breath sounds.  Abdominal:     General: Abdomen is flat. Bowel sounds are normal. There is no distension.     Palpations: Abdomen is soft. There is no mass.     Tenderness: There is no abdominal tenderness. There is no guarding or rebound.     Hernia: No hernia is present.  Neurological:     Mental Status: He is alert.           Assessment & Plan:  UTI, treat with Cipro for 10 days. Culture the sample.  Alysia Penna, MD

## 2020-08-13 DIAGNOSIS — D631 Anemia in chronic kidney disease: Secondary | ICD-10-CM | POA: Diagnosis not present

## 2020-08-13 DIAGNOSIS — Z992 Dependence on renal dialysis: Secondary | ICD-10-CM | POA: Diagnosis not present

## 2020-08-13 DIAGNOSIS — N186 End stage renal disease: Secondary | ICD-10-CM | POA: Diagnosis not present

## 2020-08-13 DIAGNOSIS — D509 Iron deficiency anemia, unspecified: Secondary | ICD-10-CM | POA: Diagnosis not present

## 2020-08-13 DIAGNOSIS — N2581 Secondary hyperparathyroidism of renal origin: Secondary | ICD-10-CM | POA: Diagnosis not present

## 2020-08-13 DIAGNOSIS — D689 Coagulation defect, unspecified: Secondary | ICD-10-CM | POA: Diagnosis not present

## 2020-08-14 LAB — URINE CULTURE
MICRO NUMBER:: 10847302
SPECIMEN QUALITY:: ADEQUATE

## 2020-08-16 DIAGNOSIS — N2581 Secondary hyperparathyroidism of renal origin: Secondary | ICD-10-CM | POA: Diagnosis not present

## 2020-08-16 DIAGNOSIS — D689 Coagulation defect, unspecified: Secondary | ICD-10-CM | POA: Diagnosis not present

## 2020-08-16 DIAGNOSIS — D509 Iron deficiency anemia, unspecified: Secondary | ICD-10-CM | POA: Diagnosis not present

## 2020-08-16 DIAGNOSIS — N186 End stage renal disease: Secondary | ICD-10-CM | POA: Diagnosis not present

## 2020-08-16 DIAGNOSIS — D631 Anemia in chronic kidney disease: Secondary | ICD-10-CM | POA: Diagnosis not present

## 2020-08-16 DIAGNOSIS — Z992 Dependence on renal dialysis: Secondary | ICD-10-CM | POA: Diagnosis not present

## 2020-08-18 DIAGNOSIS — D509 Iron deficiency anemia, unspecified: Secondary | ICD-10-CM | POA: Diagnosis not present

## 2020-08-18 DIAGNOSIS — N2581 Secondary hyperparathyroidism of renal origin: Secondary | ICD-10-CM | POA: Diagnosis not present

## 2020-08-18 DIAGNOSIS — Z992 Dependence on renal dialysis: Secondary | ICD-10-CM | POA: Diagnosis not present

## 2020-08-18 DIAGNOSIS — D689 Coagulation defect, unspecified: Secondary | ICD-10-CM | POA: Diagnosis not present

## 2020-08-18 DIAGNOSIS — N186 End stage renal disease: Secondary | ICD-10-CM | POA: Diagnosis not present

## 2020-08-18 DIAGNOSIS — D631 Anemia in chronic kidney disease: Secondary | ICD-10-CM | POA: Diagnosis not present

## 2020-08-20 DIAGNOSIS — D509 Iron deficiency anemia, unspecified: Secondary | ICD-10-CM | POA: Diagnosis not present

## 2020-08-20 DIAGNOSIS — N2581 Secondary hyperparathyroidism of renal origin: Secondary | ICD-10-CM | POA: Diagnosis not present

## 2020-08-20 DIAGNOSIS — N186 End stage renal disease: Secondary | ICD-10-CM | POA: Diagnosis not present

## 2020-08-20 DIAGNOSIS — Z992 Dependence on renal dialysis: Secondary | ICD-10-CM | POA: Diagnosis not present

## 2020-08-20 DIAGNOSIS — D631 Anemia in chronic kidney disease: Secondary | ICD-10-CM | POA: Diagnosis not present

## 2020-08-20 DIAGNOSIS — D689 Coagulation defect, unspecified: Secondary | ICD-10-CM | POA: Diagnosis not present

## 2020-08-23 DIAGNOSIS — Z992 Dependence on renal dialysis: Secondary | ICD-10-CM | POA: Diagnosis not present

## 2020-08-23 DIAGNOSIS — N2581 Secondary hyperparathyroidism of renal origin: Secondary | ICD-10-CM | POA: Diagnosis not present

## 2020-08-23 DIAGNOSIS — D509 Iron deficiency anemia, unspecified: Secondary | ICD-10-CM | POA: Diagnosis not present

## 2020-08-23 DIAGNOSIS — D689 Coagulation defect, unspecified: Secondary | ICD-10-CM | POA: Diagnosis not present

## 2020-08-23 DIAGNOSIS — N186 End stage renal disease: Secondary | ICD-10-CM | POA: Diagnosis not present

## 2020-08-23 DIAGNOSIS — D631 Anemia in chronic kidney disease: Secondary | ICD-10-CM | POA: Diagnosis not present

## 2020-08-24 DIAGNOSIS — Z992 Dependence on renal dialysis: Secondary | ICD-10-CM | POA: Diagnosis not present

## 2020-08-24 DIAGNOSIS — N186 End stage renal disease: Secondary | ICD-10-CM | POA: Diagnosis not present

## 2020-08-24 DIAGNOSIS — I129 Hypertensive chronic kidney disease with stage 1 through stage 4 chronic kidney disease, or unspecified chronic kidney disease: Secondary | ICD-10-CM | POA: Diagnosis not present

## 2020-08-25 DIAGNOSIS — D631 Anemia in chronic kidney disease: Secondary | ICD-10-CM | POA: Diagnosis not present

## 2020-08-25 DIAGNOSIS — Z283 Underimmunization status: Secondary | ICD-10-CM | POA: Diagnosis not present

## 2020-08-25 DIAGNOSIS — N186 End stage renal disease: Secondary | ICD-10-CM | POA: Diagnosis not present

## 2020-08-25 DIAGNOSIS — N2581 Secondary hyperparathyroidism of renal origin: Secondary | ICD-10-CM | POA: Diagnosis not present

## 2020-08-25 DIAGNOSIS — Z992 Dependence on renal dialysis: Secondary | ICD-10-CM | POA: Diagnosis not present

## 2020-08-25 DIAGNOSIS — D509 Iron deficiency anemia, unspecified: Secondary | ICD-10-CM | POA: Diagnosis not present

## 2020-08-25 DIAGNOSIS — Z23 Encounter for immunization: Secondary | ICD-10-CM | POA: Diagnosis not present

## 2020-08-25 DIAGNOSIS — D689 Coagulation defect, unspecified: Secondary | ICD-10-CM | POA: Diagnosis not present

## 2020-08-27 DIAGNOSIS — N186 End stage renal disease: Secondary | ICD-10-CM | POA: Diagnosis not present

## 2020-08-27 DIAGNOSIS — N2581 Secondary hyperparathyroidism of renal origin: Secondary | ICD-10-CM | POA: Diagnosis not present

## 2020-08-27 DIAGNOSIS — D509 Iron deficiency anemia, unspecified: Secondary | ICD-10-CM | POA: Diagnosis not present

## 2020-08-27 DIAGNOSIS — Z992 Dependence on renal dialysis: Secondary | ICD-10-CM | POA: Diagnosis not present

## 2020-08-27 DIAGNOSIS — Z23 Encounter for immunization: Secondary | ICD-10-CM | POA: Diagnosis not present

## 2020-08-27 DIAGNOSIS — D689 Coagulation defect, unspecified: Secondary | ICD-10-CM | POA: Diagnosis not present

## 2020-08-27 DIAGNOSIS — D631 Anemia in chronic kidney disease: Secondary | ICD-10-CM | POA: Diagnosis not present

## 2020-08-27 DIAGNOSIS — Z283 Underimmunization status: Secondary | ICD-10-CM | POA: Diagnosis not present

## 2020-08-30 DIAGNOSIS — D689 Coagulation defect, unspecified: Secondary | ICD-10-CM | POA: Diagnosis not present

## 2020-08-30 DIAGNOSIS — N2581 Secondary hyperparathyroidism of renal origin: Secondary | ICD-10-CM | POA: Diagnosis not present

## 2020-08-30 DIAGNOSIS — Z283 Underimmunization status: Secondary | ICD-10-CM | POA: Diagnosis not present

## 2020-08-30 DIAGNOSIS — D509 Iron deficiency anemia, unspecified: Secondary | ICD-10-CM | POA: Diagnosis not present

## 2020-08-30 DIAGNOSIS — Z23 Encounter for immunization: Secondary | ICD-10-CM | POA: Diagnosis not present

## 2020-08-30 DIAGNOSIS — D631 Anemia in chronic kidney disease: Secondary | ICD-10-CM | POA: Diagnosis not present

## 2020-08-30 DIAGNOSIS — N186 End stage renal disease: Secondary | ICD-10-CM | POA: Diagnosis not present

## 2020-08-30 DIAGNOSIS — Z992 Dependence on renal dialysis: Secondary | ICD-10-CM | POA: Diagnosis not present

## 2020-09-01 DIAGNOSIS — Z23 Encounter for immunization: Secondary | ICD-10-CM | POA: Diagnosis not present

## 2020-09-01 DIAGNOSIS — D509 Iron deficiency anemia, unspecified: Secondary | ICD-10-CM | POA: Diagnosis not present

## 2020-09-01 DIAGNOSIS — N186 End stage renal disease: Secondary | ICD-10-CM | POA: Diagnosis not present

## 2020-09-01 DIAGNOSIS — Z283 Underimmunization status: Secondary | ICD-10-CM | POA: Diagnosis not present

## 2020-09-01 DIAGNOSIS — D689 Coagulation defect, unspecified: Secondary | ICD-10-CM | POA: Diagnosis not present

## 2020-09-01 DIAGNOSIS — D631 Anemia in chronic kidney disease: Secondary | ICD-10-CM | POA: Diagnosis not present

## 2020-09-01 DIAGNOSIS — N2581 Secondary hyperparathyroidism of renal origin: Secondary | ICD-10-CM | POA: Diagnosis not present

## 2020-09-01 DIAGNOSIS — Z992 Dependence on renal dialysis: Secondary | ICD-10-CM | POA: Diagnosis not present

## 2020-09-02 ENCOUNTER — Telehealth: Payer: Self-pay | Admitting: Cardiovascular Disease

## 2020-09-02 NOTE — Telephone Encounter (Signed)
Eliquis assistance application has been left with the samples with instructions to fill out and bring back to his appointment on 9/30 with Dr. Sallyanne Kuster so we may try again to get assistance.   Medication Samples have been provided to the patient.  Drug name: Eliquis       Strength: 2.5 mg        Qty: 4 boxes  LOT: [id number]  Exp.Date: 09/2021

## 2020-09-02 NOTE — Telephone Encounter (Signed)
Patient calling the office for samples of medication:   1.  What medication and dosage are you requesting samples for? apixaban (ELIQUIS) 2.5 MG TABS tablet  2.  Are you currently out of this medication? Has two pills left   Is requesting Lattie Haw be the only one who calls back in regards to this.

## 2020-09-02 NOTE — Telephone Encounter (Signed)
Follow up    Patients wife is calling back in reference to samples. She only wants Lattie Haw to call her

## 2020-09-03 DIAGNOSIS — Z992 Dependence on renal dialysis: Secondary | ICD-10-CM | POA: Diagnosis not present

## 2020-09-03 DIAGNOSIS — Z23 Encounter for immunization: Secondary | ICD-10-CM | POA: Diagnosis not present

## 2020-09-03 DIAGNOSIS — Z283 Underimmunization status: Secondary | ICD-10-CM | POA: Diagnosis not present

## 2020-09-03 DIAGNOSIS — N186 End stage renal disease: Secondary | ICD-10-CM | POA: Diagnosis not present

## 2020-09-03 DIAGNOSIS — D509 Iron deficiency anemia, unspecified: Secondary | ICD-10-CM | POA: Diagnosis not present

## 2020-09-03 DIAGNOSIS — D631 Anemia in chronic kidney disease: Secondary | ICD-10-CM | POA: Diagnosis not present

## 2020-09-03 DIAGNOSIS — N2581 Secondary hyperparathyroidism of renal origin: Secondary | ICD-10-CM | POA: Diagnosis not present

## 2020-09-03 DIAGNOSIS — D689 Coagulation defect, unspecified: Secondary | ICD-10-CM | POA: Diagnosis not present

## 2020-09-06 DIAGNOSIS — D631 Anemia in chronic kidney disease: Secondary | ICD-10-CM | POA: Diagnosis not present

## 2020-09-06 DIAGNOSIS — Z23 Encounter for immunization: Secondary | ICD-10-CM | POA: Diagnosis not present

## 2020-09-06 DIAGNOSIS — Z992 Dependence on renal dialysis: Secondary | ICD-10-CM | POA: Diagnosis not present

## 2020-09-06 DIAGNOSIS — N186 End stage renal disease: Secondary | ICD-10-CM | POA: Diagnosis not present

## 2020-09-06 DIAGNOSIS — Z283 Underimmunization status: Secondary | ICD-10-CM | POA: Diagnosis not present

## 2020-09-06 DIAGNOSIS — N2581 Secondary hyperparathyroidism of renal origin: Secondary | ICD-10-CM | POA: Diagnosis not present

## 2020-09-06 DIAGNOSIS — D689 Coagulation defect, unspecified: Secondary | ICD-10-CM | POA: Diagnosis not present

## 2020-09-06 DIAGNOSIS — D509 Iron deficiency anemia, unspecified: Secondary | ICD-10-CM | POA: Diagnosis not present

## 2020-09-08 DIAGNOSIS — Z283 Underimmunization status: Secondary | ICD-10-CM | POA: Diagnosis not present

## 2020-09-08 DIAGNOSIS — N2581 Secondary hyperparathyroidism of renal origin: Secondary | ICD-10-CM | POA: Diagnosis not present

## 2020-09-08 DIAGNOSIS — D631 Anemia in chronic kidney disease: Secondary | ICD-10-CM | POA: Diagnosis not present

## 2020-09-08 DIAGNOSIS — D509 Iron deficiency anemia, unspecified: Secondary | ICD-10-CM | POA: Diagnosis not present

## 2020-09-08 DIAGNOSIS — Z23 Encounter for immunization: Secondary | ICD-10-CM | POA: Diagnosis not present

## 2020-09-08 DIAGNOSIS — Z992 Dependence on renal dialysis: Secondary | ICD-10-CM | POA: Diagnosis not present

## 2020-09-08 DIAGNOSIS — N186 End stage renal disease: Secondary | ICD-10-CM | POA: Diagnosis not present

## 2020-09-08 DIAGNOSIS — D689 Coagulation defect, unspecified: Secondary | ICD-10-CM | POA: Diagnosis not present

## 2020-09-10 DIAGNOSIS — Z23 Encounter for immunization: Secondary | ICD-10-CM | POA: Diagnosis not present

## 2020-09-10 DIAGNOSIS — N186 End stage renal disease: Secondary | ICD-10-CM | POA: Diagnosis not present

## 2020-09-10 DIAGNOSIS — D509 Iron deficiency anemia, unspecified: Secondary | ICD-10-CM | POA: Diagnosis not present

## 2020-09-10 DIAGNOSIS — N2581 Secondary hyperparathyroidism of renal origin: Secondary | ICD-10-CM | POA: Diagnosis not present

## 2020-09-10 DIAGNOSIS — D631 Anemia in chronic kidney disease: Secondary | ICD-10-CM | POA: Diagnosis not present

## 2020-09-10 DIAGNOSIS — Z992 Dependence on renal dialysis: Secondary | ICD-10-CM | POA: Diagnosis not present

## 2020-09-10 DIAGNOSIS — Z283 Underimmunization status: Secondary | ICD-10-CM | POA: Diagnosis not present

## 2020-09-10 DIAGNOSIS — D689 Coagulation defect, unspecified: Secondary | ICD-10-CM | POA: Diagnosis not present

## 2020-09-13 DIAGNOSIS — D689 Coagulation defect, unspecified: Secondary | ICD-10-CM | POA: Diagnosis not present

## 2020-09-13 DIAGNOSIS — Z992 Dependence on renal dialysis: Secondary | ICD-10-CM | POA: Diagnosis not present

## 2020-09-13 DIAGNOSIS — Z283 Underimmunization status: Secondary | ICD-10-CM | POA: Diagnosis not present

## 2020-09-13 DIAGNOSIS — N186 End stage renal disease: Secondary | ICD-10-CM | POA: Diagnosis not present

## 2020-09-13 DIAGNOSIS — N2581 Secondary hyperparathyroidism of renal origin: Secondary | ICD-10-CM | POA: Diagnosis not present

## 2020-09-13 DIAGNOSIS — D509 Iron deficiency anemia, unspecified: Secondary | ICD-10-CM | POA: Diagnosis not present

## 2020-09-13 DIAGNOSIS — Z23 Encounter for immunization: Secondary | ICD-10-CM | POA: Diagnosis not present

## 2020-09-13 DIAGNOSIS — D631 Anemia in chronic kidney disease: Secondary | ICD-10-CM | POA: Diagnosis not present

## 2020-09-15 DIAGNOSIS — Z23 Encounter for immunization: Secondary | ICD-10-CM | POA: Diagnosis not present

## 2020-09-15 DIAGNOSIS — Z283 Underimmunization status: Secondary | ICD-10-CM | POA: Diagnosis not present

## 2020-09-15 DIAGNOSIS — N186 End stage renal disease: Secondary | ICD-10-CM | POA: Diagnosis not present

## 2020-09-15 DIAGNOSIS — D631 Anemia in chronic kidney disease: Secondary | ICD-10-CM | POA: Diagnosis not present

## 2020-09-15 DIAGNOSIS — Z992 Dependence on renal dialysis: Secondary | ICD-10-CM | POA: Diagnosis not present

## 2020-09-15 DIAGNOSIS — D509 Iron deficiency anemia, unspecified: Secondary | ICD-10-CM | POA: Diagnosis not present

## 2020-09-15 DIAGNOSIS — D689 Coagulation defect, unspecified: Secondary | ICD-10-CM | POA: Diagnosis not present

## 2020-09-15 DIAGNOSIS — N2581 Secondary hyperparathyroidism of renal origin: Secondary | ICD-10-CM | POA: Diagnosis not present

## 2020-09-17 DIAGNOSIS — Z23 Encounter for immunization: Secondary | ICD-10-CM | POA: Diagnosis not present

## 2020-09-17 DIAGNOSIS — D631 Anemia in chronic kidney disease: Secondary | ICD-10-CM | POA: Diagnosis not present

## 2020-09-17 DIAGNOSIS — D509 Iron deficiency anemia, unspecified: Secondary | ICD-10-CM | POA: Diagnosis not present

## 2020-09-17 DIAGNOSIS — N2581 Secondary hyperparathyroidism of renal origin: Secondary | ICD-10-CM | POA: Diagnosis not present

## 2020-09-17 DIAGNOSIS — D689 Coagulation defect, unspecified: Secondary | ICD-10-CM | POA: Diagnosis not present

## 2020-09-17 DIAGNOSIS — N186 End stage renal disease: Secondary | ICD-10-CM | POA: Diagnosis not present

## 2020-09-17 DIAGNOSIS — Z992 Dependence on renal dialysis: Secondary | ICD-10-CM | POA: Diagnosis not present

## 2020-09-17 DIAGNOSIS — Z283 Underimmunization status: Secondary | ICD-10-CM | POA: Diagnosis not present

## 2020-09-20 DIAGNOSIS — Z283 Underimmunization status: Secondary | ICD-10-CM | POA: Diagnosis not present

## 2020-09-20 DIAGNOSIS — N2581 Secondary hyperparathyroidism of renal origin: Secondary | ICD-10-CM | POA: Diagnosis not present

## 2020-09-20 DIAGNOSIS — Z992 Dependence on renal dialysis: Secondary | ICD-10-CM | POA: Diagnosis not present

## 2020-09-20 DIAGNOSIS — D689 Coagulation defect, unspecified: Secondary | ICD-10-CM | POA: Diagnosis not present

## 2020-09-20 DIAGNOSIS — N186 End stage renal disease: Secondary | ICD-10-CM | POA: Diagnosis not present

## 2020-09-20 DIAGNOSIS — Z23 Encounter for immunization: Secondary | ICD-10-CM | POA: Diagnosis not present

## 2020-09-20 DIAGNOSIS — D509 Iron deficiency anemia, unspecified: Secondary | ICD-10-CM | POA: Diagnosis not present

## 2020-09-20 DIAGNOSIS — D631 Anemia in chronic kidney disease: Secondary | ICD-10-CM | POA: Diagnosis not present

## 2020-09-22 DIAGNOSIS — D689 Coagulation defect, unspecified: Secondary | ICD-10-CM | POA: Diagnosis not present

## 2020-09-22 DIAGNOSIS — Z23 Encounter for immunization: Secondary | ICD-10-CM | POA: Diagnosis not present

## 2020-09-22 DIAGNOSIS — Z992 Dependence on renal dialysis: Secondary | ICD-10-CM | POA: Diagnosis not present

## 2020-09-22 DIAGNOSIS — N2581 Secondary hyperparathyroidism of renal origin: Secondary | ICD-10-CM | POA: Diagnosis not present

## 2020-09-22 DIAGNOSIS — N186 End stage renal disease: Secondary | ICD-10-CM | POA: Diagnosis not present

## 2020-09-22 DIAGNOSIS — D631 Anemia in chronic kidney disease: Secondary | ICD-10-CM | POA: Diagnosis not present

## 2020-09-22 DIAGNOSIS — D509 Iron deficiency anemia, unspecified: Secondary | ICD-10-CM | POA: Diagnosis not present

## 2020-09-22 DIAGNOSIS — Z283 Underimmunization status: Secondary | ICD-10-CM | POA: Diagnosis not present

## 2020-09-23 ENCOUNTER — Ambulatory Visit (INDEPENDENT_AMBULATORY_CARE_PROVIDER_SITE_OTHER): Payer: Medicare Other | Admitting: Cardiovascular Disease

## 2020-09-23 ENCOUNTER — Other Ambulatory Visit: Payer: Self-pay

## 2020-09-23 ENCOUNTER — Encounter: Payer: Self-pay | Admitting: Cardiovascular Disease

## 2020-09-23 VITALS — BP 136/94 | HR 101 | Ht 72.0 in | Wt 310.8 lb

## 2020-09-23 DIAGNOSIS — Z9889 Other specified postprocedural states: Secondary | ICD-10-CM

## 2020-09-23 DIAGNOSIS — R9431 Abnormal electrocardiogram [ECG] [EKG]: Secondary | ICD-10-CM

## 2020-09-23 DIAGNOSIS — I129 Hypertensive chronic kidney disease with stage 1 through stage 4 chronic kidney disease, or unspecified chronic kidney disease: Secondary | ICD-10-CM | POA: Diagnosis not present

## 2020-09-23 DIAGNOSIS — N186 End stage renal disease: Secondary | ICD-10-CM | POA: Diagnosis not present

## 2020-09-23 DIAGNOSIS — I5032 Chronic diastolic (congestive) heart failure: Secondary | ICD-10-CM | POA: Diagnosis not present

## 2020-09-23 DIAGNOSIS — Z86711 Personal history of pulmonary embolism: Secondary | ICD-10-CM

## 2020-09-23 DIAGNOSIS — I10 Essential (primary) hypertension: Secondary | ICD-10-CM | POA: Diagnosis not present

## 2020-09-23 DIAGNOSIS — Z992 Dependence on renal dialysis: Secondary | ICD-10-CM | POA: Diagnosis not present

## 2020-09-23 DIAGNOSIS — I422 Other hypertrophic cardiomyopathy: Secondary | ICD-10-CM | POA: Diagnosis not present

## 2020-09-23 DIAGNOSIS — I351 Nonrheumatic aortic (valve) insufficiency: Secondary | ICD-10-CM

## 2020-09-23 DIAGNOSIS — Z8679 Personal history of other diseases of the circulatory system: Secondary | ICD-10-CM

## 2020-09-23 DIAGNOSIS — E7800 Pure hypercholesterolemia, unspecified: Secondary | ICD-10-CM

## 2020-09-23 NOTE — Progress Notes (Signed)
Cardiology office note     Evaluation Performed:  Follow-up visit  Date:  09/23/2020   ID:  [name], DOB 1971/09/09, MRN [id number]   PCP:  Laurey Morale, MD  Cardiologist:  Jaymie Misch Electrophysiologist:  None   Chief Complaint:  Aortic valve repair f/u, chronic diastolic CHF f/u  History of Present Illness:    [name] is a 49 y.o. male with a history of obesity, severe systemic hypertension, aneurysm of the ascending aorta s/p Yacoub valve sparing root repair and 26mm HAART300 aortic annuloplasty(Dr. Ysidro Evert, [facility], 2018), , history of stent graft repair for abdominal aortic aneurysm and dissection (Dr. Kellie Simmering, 2013), bilateral iliac artery aneurysms, hypertensive heart disease with severe left ventricular hypertrophy and well compensated diastolic heart failure, mild to moderate aortic insufficiency, history of DVT and pulmonary embolism on chronic anticoagulation (history of bleeding problems on Xarelto, switched to low-dose Eliquis), ESRD (Dr. [name], dialysis center Dr. Hollie Salk), large left forearm AV fistula ligated with a new left upper arm fistula.  He started hemodialysis Monday Wednesday Friday in January.  He was having extreme fatigue before that and he is now "80% better".  He does not have issues of dyspnea and has only minimal ankle edema.  His dry weight has been established at 150 kg.  Had some issues with low potassium and his diet is being adjusted.  Still makes urine about twice a day.  Denies chest pain, palpitations, syncope.  Had some dizziness 1 day when ultrafiltration was exceeded beyond his dry weight.[name]  He is no longer taking oral anticoagulants.  Xarelto caused recurrent spontaneous bleeding.  Eliquis was well-tolerated but was unaffordable, although we appealed to the manufacturer for patient assistance and appealed to his insurance company for tier exception.  He is currently taking aspirin for DVT prophylaxis.  He has not had unilateral  leg swelling, tenderness, cough or hemoptysis.  Blood pressure is not very well controlled on amlodipine and carvedilol.  Follow-up cardiac and aortic MRI performed in October if still shows severe left ventricular hypertrophy with preserved left ventricular systolic function and intact aortic valve repair without regurgitation.  To transaortic flow velocity has increased further from 2.9 m/seconds to 3.5 m/second.  He has not had an echocardiogram.  There is no evidence of hemochromatosis.  There was suspicion for amyloidosis but gadolinium could not be administered.  The aortic repair is intact.  The patient does not have symptoms concerning for COVID-19 infection (fever, chills, cough, or new shortness of breath).  He is very reluctant to have the vaccine.   Past Medical History:  Diagnosis Date   Abdominal aortic aneurysm (HCC)    Anemia    Anxiety    Aortic regurgitation 12/10/08   Echo: mild to mod. AOV regurg,mild AO root dilatation, LA mildly dilated, EF 65%, LV wall thickness markedly increased, small PE   Chronic kidney disease    Chronic renal disease    advanced   Complication of anesthesia    slow to awaken 1 time   DVT (deep venous thrombosis) ([place])    H/O aortic dissection 2009   infarenal abdominal aortic dissection   History of pulmonary embolus (PE) 2010   Hypertension    dr webb   Left ventricular hypertrophy    Obesity    Stroke [facility]) 2009   pt says no stroke   Past Surgical History:  Procedure Laterality Date   ABDOMINAL AORTAGRAM N/A 02/06/2012   Procedure: ABDOMINAL Maxcine Ham;  Surgeon: Butch Penny  Trula Slade, MD;  Location: [place] CATH LAB;  Service: Cardiovascular;  Laterality: N/A;   ABDOMINAL AORTIC ANEURYSM REPAIR  09/25/2012   EVAR   AORTIC ROOT REPLACEMENT  05/07/2017   s/p valve sparing root replacement with coronary reconstruction along with repair of aortic valve with a 21MM HAART 300 aortic ring annuloplasty at [place] Left 05/19/2015   Procedure: ARTERIOVENOUS (AV) FISTULA CREATION-LEFT RADIOCEPHALIC;  Surgeon: Mal [name], MD;  Location: [place];  Service: Vascular;  Laterality: Left;   [place] Left 03/11/2020   Procedure: LEFT ARM ARTERIOVENOUS FISTULA  TRANSPOSITION;  Surgeon: [name], MD;  Location: [place];  Service: Vascular;  Laterality: Left;   CARDIAC CATHETERIZATION  02/11/10   false + Nuc   CORNEAL TRANSPLANT Left 1997   FISTULA SUPERFICIALIZATION Left 08/04/2015   Procedure: FISTULA SUPERFICIALIZATION LEFT RADIAL CEPHALIC;  Surgeon: Mal [name], MD;  Location: [place];  Service: Vascular;  Laterality: Left;   INTRAVASCULAR ULTRASOUND  09/25/2012   Procedure: INTRAVASCULAR ULTRASOUND;  Surgeon: Mal [name], MD;  Location: [place];  Service: Vascular;  Laterality: N/A;   IR ANGIOGRAM PELVIS SELECTIVE OR SUPRASELECTIVE  06/18/2020   IR ANGIOGRAM SELECTIVE EACH ADDITIONAL VESSEL  06/18/2020   IR AORTAGRAM ABDOMINAL SERIALOGRAM  06/18/2020   IR RADIOLOGIST EVAL & MGMT  06/08/2020   IR US GUIDE VASC ACCESS RIGHT  06/18/2020   LIGATION OF COMPETING BRANCHES OF ARTERIOVENOUS FISTULA Left 08/04/2015   Procedure: LIGATION OF MULTIPLE COMPETING BRANCHES OF ARTERIOVENOUS FISTULA;  Surgeon: Mal [name], MD;  Location[facility];  Service: Vascular;  Laterality: Left;   RESECTION OF ARTERIOVENOUS FISTULA ANEURYSM Left 01/02/2018   Procedure: RESECTION OF ARTERIOVENOUS FISTULA ANEURYSM LEFT RADIOCEPHALIC;  Surgeon: [name], MD;  Location: [facility] OR;  Service: Cardiovascular;  Laterality: Left;     Current Meds  Medication Sig   acetaminophen (TYLENOL) 500 MG tablet Take 1,000 mg by mouth every 6 (six) hours as needed for moderate pain or headache. Reported on 12/14/2015   amLODipine (NORVASC) 5 MG tablet Take 1 tablet (5 mg total) by mouth daily.   apixaban (ELIQUIS) 2.5 MG TABS tablet Take 1 tablet (2.5 mg total) by mouth 2 (two) times daily. (Patient taking  differently: Take 2.5 mg by mouth See admin instructions. Mon, Wed, Fri, take once daily, all other days taking twice daily)   aspirin 81 MG EC tablet Take 81 mg by mouth See admin instructions. Mon Wed Fri take 1 tablet daily, and Tues and Thurs take 1 tablet twice daily  Takes once a day if patient has Eliquis   atorvastatin (LIPITOR) 10 MG tablet TAKE 1 TABLET BY MOUTH  DAILY (Patient taking differently: Take 10 mg by mouth at bedtime. )   B Complex-C-Folic Acid (DIALYVITE 161) 0.8 MG TABS Take 1 tablet by mouth daily.   carvedilol (COREG) 25 MG tablet Take 0.5 tablets (12.5 mg total) by mouth 2 (two) times daily. If SBP > 120 (Patient taking differently: Take 25 mg by mouth in the morning and at bedtime. )   ciprofloxacin (CIPRO) 500 MG tablet Take 1 tablet (500 mg total) by mouth 2 (two) times daily.   Methoxy PEG-Epoetin Beta (MIRCERA IJ) Inject as directed as needed.    venlafaxine XR (EFFEXOR-XR) 150 MG 24 hr capsule TAKE 1 CAPSULE BY MOUTH EVERY DAY   zolpidem (AMBIEN) 10 MG tablet TAKE 1 TABLET BY MOUTH AT BEDTIME AS NEEDED FOR SLEEP.  Allergies:   Oxycodone   Social History   Tobacco Use   Smoking status: Former Smoker    Years: 3.00    Types: Cigarettes    Quit date: 01/25/1991    Years since quitting: 29.6   Smokeless tobacco: Never Used  Vaping Use   Vaping Use: Never used  Substance Use Topics   Alcohol use: No    Alcohol/week: 0.0 standard drinks   Drug use: No     Family Hx: The patient's family history includes Hypertension in his father and mother.  ROS:   Please see the history of present illness.     All other systems reviewed and are negative.   Prior CV studies:   The following studies were reviewed today: Notes from his follow-up visit with Dr. Ysidro Evert May 2019, CT of the aorta with stable aortic repair same date  Labs/Other Tests and Data Reviewed:    Cardiac/aortic MRI 10/17/2019: Cardiac MRI:  1. The left ventricle is normal in  cavity size. There is concentric severe left ventricular hypertrophy with a septal wall thickness of 2.1 cm (prior 2.0 cm) with a calculated myocardial mass of 325g (prior 328g). Global systolic function is hyperdynamic. The LV ejection fraction is 80% (prior 81%). There are no regional wall motion abnormalities.  2. The right ventricle is normal in cavity size, wall thickness, and systolic function.  3. Mildly enlarged left atrium.  Normal sized right atrium  4. The aortic valve is trileaflet (s/p annuloplasty) in morphology and function. The peak aortic valve 3.5 m/s (prior 2.9 m/s). There is no significant valvular disease.  5. T2* was 36 ms (prior 42 ms), indicating the patient does not have iron overload.  6. Native T1 is 1482 ms (prior 1421 ms) (upper limit of normal is 1284 ms);  these findings could suggest cardiac amyloidosis. However, without gadolinium administration this finding cannot be confirmed.   Chest MRA  1. Postsurgical changes of aortic root replacement without evidence of complication.  There has been reimplantation of the coronary arteritis.    2. The aortic arch is left sided. There is normal branching of the arch vessels.  3. The main pulmonary artery is enlarged, which can suggest pulmonary hypertension   4. Trace bilateral pleural effusions are present  5. Cholelithiasis, incompletely evaluated.   Bi-orthogonal luminal aortic dimensions with prior measurements below:  Sinus of Valsalva (largest dimension, cross-sectional dimension): 4.7 x 3.6 cm (previously 4.7 x 3.5 cm) Sinotubular junction: 2.7 x 2.5 cm (previously 2.7 x 2.5 cm) Mid-ascending aorta: 3.2 x 3.2 cm (previously 3.2 x 3.2 cm) Proximal aortic arch (aorta at the origin of the innominate artery): 3.0 x 3.1 cm (previously 3.3 x 3.2 cm) Mid-aortic arch (between left common carotid and subclavian arteries): 2.9 x 2.9 cm (previously 3.2 x 3.1 cm) Proximal descending thoracic aorta: 2.3 x 2.4 cm  (previously 2.7 x 2.6 cm) Mid-descending aorta: 2.5 x 2.5 cm (previously 2.7 x 2.7 cm)   EKG:  An ECG dated today was personally reviewed today and demonstrated:  NSR, LA abnormality, incomplete LBBB (QRS 110 ms), prominent secondary ST-T changes inferolaterally, QTc 504 ms.  Recent Labs: 06/18/2020: BUN 24; Creatinine, Ser 4.78; Hemoglobin 8.3; Platelets 278; Potassium 4.1; Sodium 138   Recent Lipid Panel Lab Results  Component Value Date/Time   CHOL 140 02/29/2012 08:29 AM   TRIG 72 02/29/2012 08:29 AM   HDL 42 02/29/2012 08:29 AM   CHOLHDL 3.3 02/29/2012 08:29 AM   LDLCALC 84 02/29/2012 08:29  AM    Wt Readings from Last 3 Encounters:  09/23/20 (!) 310 lb 12.8 oz (141 kg)  08/12/20 (!) 320 lb 3.2 oz (145.2 kg)  06/29/20 (!) 327 lb (148.3 kg)     Objective:    Vital Signs:  BP (!) 136/94    Pulse (!) 101    Ht 6' (1.829 m)    Wt (!) 310 lb 12.8 oz (141 kg)    SpO2 98%    BMI 42.15 kg/m     General: Alert, oriented x3, no distress, morbid obesity Head: no evidence of trauma, PERRL, EOMI, no exophtalmos or lid lag, no myxedema, no xanthelasma; normal ears, nose and oropharynx Neck: normal jugular venous pulsations and no hepatojugular reflux; brisk carotid pulses without delay and no carotid bruits Chest: clear to auscultation, no signs of consolidation by percussion or palpation, normal fremitus, symmetrical and full respiratory excursions.  Healthy right dialysis catheter site Cardiovascular: normal position and quality of the apical impulse, regular rhythm, normal first and second heart sounds, no rubs or gallops, the continuous bruit from the AV fistula is radiating throughout the chest and makes it hard to pick out any diastolic murmurs. Abdomen: no tenderness or distention, no masses by palpation, no abnormal pulsatility or arterial bruits, normal bowel sounds, no hepatosplenomegaly Extremities: no clubbing, cyanosis.  1+ symmetrical ankle edema.  Aneurysmal left forearm AV  fistula without thrill or bruit, excellent thrill and bruit overlying the left upper arm AV fistula with well-healing scars Neurological: grossly nonfocal Psych: Normal mood and affect   ASSESSMENT & PLAN:    1. CHF: Chronic diastolic heart failure due to severe left ventricular hypertrophy, at least partly due to longstanding hypertension that was insufficiently treated.  His MRI findings do raise concern for possible cardiac amyloidosis (gadolinium could not be administered), although the course of his illness has been very benign compared to the typical course of the most common cases of hereditary or wild-type TTR amyloidosis.  We will schedule for a pyrophosphate scan and a repeat echocardiogram.  Currently appears to be euvolemic at the established "dry weight" of 150 kg.  2. ESRD: On hemodialysis Monday Wednesday Friday, currently through a right subclavian catheter until his left upper arm AV fistula is fully mature.  Still makes urine approximately twice a day.  Fatigue has improved "80%" since starting hemodialysis.   3. Long QT: reports having some issues with low potassium, with subsequent adjustments in diet.  Today's ECG shows a moderately prolonged QT interval that may reflect a low potassium level.  Avoid additional QT prolonging drugs. 4. HTN: Excellent control.. 5. Asc Ao aneurysm: s/p repair 2018.  Good findings at cardiac MRI in October 2020, at [facility]. 6. AI: s/p aortic valve annuloplasty repair with a 21 mm HAART 300 ring.  Mild iatrogenic aortic stenosis.  MRI reports aortic velocity has increased from 2.9-3.5 but also reports "no aortic valve disease".  Suspect this is due to increased cardiac output from his AV fistula.  Recheck echocardiogram. 7. AAA:  History of graft repair, monitored by Dr. Trula Slade.  Unable to administer contrast for follow-up has been with CT without contrast or ultrasound. 8. Iliac aneurysm: as above 9. DVT was complicated by pulmonary embolism.    Currently just on aspirin for prophylaxis.  Unable to afford DOAC.  No bleeding problems on the Eliquis (which did occur with Xarelto). Has been denied patient assistance and tier exception.  Currently on aspirin for DVT prophylaxis 10. Morbid obesity:  Congratulated  him on losing about 50 pounds.  Keep on trying to gradually lose weight. 11. HLP: Check lipids at the dialysis center when he next has labs.   COVID-19 Education: The signs and symptoms of COVID-19 were discussed with the patient and how to seek care for testing (follow up with PCP or arrange E-visit).  The importance of social distancing was discussed today.  Time:   Today, I have spent 31 minutes with the patient with telehealth technology discussing the above problems.     Medication Adjustments/Labs and Tests Ordered: Current medicines are reviewed at length with the patient today.  Concerns regarding medicines are outlined above.   Tests Ordered: No orders of the defined types were placed in this encounter.   Medication Changes: No orders of the defined types were placed in this encounter.   Disposition:  Follow up 12 months  Signed, Sanda Klein, MD  09/23/2020 10:22 AM    [facility]

## 2020-09-23 NOTE — Patient Instructions (Signed)
Medication Instructions:  No changes *If you need a refill on your cardiac medications before your next appointment, please call your pharmacy*   Lab Work: None ordered If you have labs (blood work) drawn today and your tests are completely normal, you will receive your results only by: [name] MyChart Message (if you have MyChart) OR . A paper copy in the mail If you have any lab test that is abnormal or we need to change your treatment, we will call you to review the results.   Testing/Procedures: None ordered   Follow-Up: At [facility], you and your health needs are our priority.  As part of our continuing mission to provide you with exceptional heart care, we have created designated Provider Care Teams.  These Care Teams include your primary Cardiologist (physician) and Advanced Practice Providers (APPs -  Physician Assistants and Nurse Practitioners) who all work together to provide you with the care you need, when you need it.  We recommend signing up for the patient portal called "MyChart".  Sign up information is provided on this After Visit Summary.  MyChart is used to connect with patients for Virtual Visits (Telemedicine).  Patients are able to view lab/test results, encounter notes, upcoming appointments, etc.  Non-urgent messages can be sent to your provider as well.   To learn more about what you can do with MyChart, go to [email or web address].    Your next appointment:   12 month(s)  The format for your next appointment:   In Person  Provider:   You may see Sanda Klein, MD or one of the following Advanced Practice Providers on your designated Care Team:    Almyra Deforest, PA-C  Fabian Sharp, [place] or   Roby Lofts, PA-C    Other Instructions We will talk to one on our social workers about the cost of the Eliquis.

## 2020-09-24 DIAGNOSIS — D509 Iron deficiency anemia, unspecified: Secondary | ICD-10-CM | POA: Diagnosis not present

## 2020-09-24 DIAGNOSIS — D689 Coagulation defect, unspecified: Secondary | ICD-10-CM | POA: Diagnosis not present

## 2020-09-24 DIAGNOSIS — N186 End stage renal disease: Secondary | ICD-10-CM | POA: Diagnosis not present

## 2020-09-24 DIAGNOSIS — N2581 Secondary hyperparathyroidism of renal origin: Secondary | ICD-10-CM | POA: Diagnosis not present

## 2020-09-24 DIAGNOSIS — D631 Anemia in chronic kidney disease: Secondary | ICD-10-CM | POA: Diagnosis not present

## 2020-09-24 DIAGNOSIS — Z992 Dependence on renal dialysis: Secondary | ICD-10-CM | POA: Diagnosis not present

## 2020-09-25 NOTE — Progress Notes (Signed)
Cardiology office note     Evaluation Performed:  Follow-up visit  Date:  09/25/2020   ID:  [name], DOB 01-18-1971, MRN [id number]   PCP:  Laurey Morale, MD  Cardiologist:  Eilene Voigt Electrophysiologist:  None   Chief Complaint:  Aortic valve repair f/u, chronic diastolic CHF f/u  History of Present Illness:    [name] is a 49 y.o. male with a history of obesity, severe systemic hypertension, aneurysm of the ascending aorta s/p Yacoub valve sparing root repair and 44mm HAART300 aortic annuloplasty(Dr. Ysidro Evert, [facility], 2018), , history of stent graft repair for abdominal aortic aneurysm and dissection (Dr. Kellie Simmering, 2013), bilateral iliac artery aneurysms, hypertensive heart disease with severe left ventricular hypertrophy and well compensated diastolic heart failure, mild to moderate aortic insufficiency, history of DVT and pulmonary embolism on chronic anticoagulation (history of bleeding problems on Xarelto, switched to low-dose Eliquis), ESRD (Dr. [name], dialysis center Dr. Hollie Salk), large left forearm AV fistula ligated with a new left upper arm fistula.  He started hemodialysis Monday Wednesday Friday in January.  He had extreme fatigue before that had improvement with hemodialysis, but has not been able to return to his previous level of physical activity.  He is not completely anuric and urinates about twice a day.  He does not develop dyspnea during the sessions and has only mild ankle swelling.  At times he has severe cramping whenever more than 2 L of ultrafiltration not required.  He has not been necessary to interrupt his dialysis due to the hypotension, but he often has low blood pressures following dialysis sessions.  They often make him wait in the dialysis center for a while for his blood pressure to recover.  He had an episode of syncope that was strongly suggestive of an acute pulmonary embolism event.  This occurred when he had been taking less than the  recommended dose of Eliquis since somebody had suggested that it would be dialyzed off.  He was only taking it on nondialysis days.  He is also having a lot of time paying for the Eliquis which costs him about $270 every month.  Technetium pyrophosphate scan in May 2021 was interpreted as equivocal, but in my opinion it did not show evidence of cardiac amyloidosis.  His echo in April 2021 showed normal LVEF 65-70% with severe LVH and grade 1 diastolic dysfunction.  The left atrium was moderately dilated.  The aortic valve repair was holding up well with only mild regurgitation.  The mean transvalvular gradient was 14 mmHg.  There has been concern about enlargement of the endosac of his AAA.  He underwent an abdominal aortogram on July 30, 2020 [facility] with selective arteriography of multiple branches, and an attempt to identify the source of the type II endoleak.  No evidence of an artery feeding endoleak was identified.  No embolization was performed.  Follow-up cardiac and aortic MRI performed in October 2020 still shows severe left ventricular hypertrophy with preserved left ventricular systolic function and intact aortic valve repair without regurgitation.  The transaortic flow velocity has increased further from 2.9 m/seconds to 3.5 m/second.  He has not had an echocardiogram.  There is no evidence of hemochromatosis.  There was suspicion for amyloidosis but gadolinium could not be administered.  The aortic repair is intact.     Past Medical History:  Diagnosis Date  . Abdominal aortic aneurysm ([place])   . Anemia   . Anxiety   . Aortic regurgitation 12/10/08  Echo: mild to mod. AOV regurg,mild AO root dilatation, LA mildly dilated, EF 65%, LV wall thickness markedly increased, small PE  . Chronic kidney disease   . Chronic renal disease    advanced  . Complication of anesthesia    slow to awaken 1 time  . DVT (deep venous thrombosis) ([place])   . 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 History of pulmonary embolus (PE) 2010  . Hypertension    dr [name]  . Left ventricular hypertrophy   . Obesity   . Stroke [facility]) 2009   pt says no stroke   Past Surgical History:  Procedure Laterality Date  . ABDOMINAL AORTAGRAM N/A 02/06/2012   Procedure: ABDOMINAL Maxcine Ham;  Surgeon: [name], MD;  Location: [facility] CATH LAB;  Service: Cardiovascular;  Laterality: N/A;  . ABDOMINAL AORTIC ANEURYSM REPAIR  09/25/2012   EVAR  . AORTIC ROOT REPLACEMENT  05/07/2017   s/p valve sparing root replacement with coronary reconstruction along with repair of aortic valve with a 21MM HAART 300 aortic ring annuloplasty at [facility]  . AV FISTULA PLACEMENT Left 05/19/2015   Procedure: ARTERIOVENOUS (AV) FISTULA CREATION-LEFT RADIOCEPHALIC;  Surgeon: Mal [name], MD;  Location: [place];  Service: Vascular;  Laterality: Left;  . BASCILIC VEIN TRANSPOSITION Left 03/11/2020   Procedure: LEFT ARM ARTERIOVENOUS FISTULA  TRANSPOSITION;  Surgeon: [name], MD;  Location: [place];  Service: Vascular;  Laterality: Left;  . CARDIAC CATHETERIZATION  02/11/10   false + Nuc  . CORNEAL TRANSPLANT Left 1997  . FISTULA SUPERFICIALIZATION Left 08/04/2015   Procedure: FISTULA SUPERFICIALIZATION LEFT RADIAL CEPHALIC;  Surgeon: Mal [name], MD;  Location: [place];  Service: Vascular;  Laterality: Left;  . INTRAVASCULAR ULTRASOUND  09/25/2012   Procedure: INTRAVASCULAR ULTRASOUND;  Surgeon: Mal [name], MD;  Location: [place];  Service: Vascular;  Laterality: N/A;  . IR ANGIOGRAM PELVIS SELECTIVE OR SUPRASELECTIVE  06/18/2020  . IR ANGIOGRAM SELECTIVE EACH ADDITIONAL VESSEL  06/18/2020  . IR AORTAGRAM ABDOMINAL SERIALOGRAM  06/18/2020  . IR RADIOLOGIST EVAL & MGMT  06/08/2020  . IR US GUIDE VASC ACCESS RIGHT  06/18/2020  . LIGATION OF COMPETING BRANCHES OF ARTERIOVENOUS FISTULA Left 08/04/2015   Procedure: LIGATION OF MULTIPLE COMPETING BRANCHES OF ARTERIOVENOUS FISTULA;  Surgeon: Mal [name], MD;  Location: [place];  Service: Vascular;  Laterality: Left;  . RESECTION OF ARTERIOVENOUS FISTULA ANEURYSM Left 01/02/2018   Procedure: RESECTION OF ARTERIOVENOUS FISTULA ANEURYSM LEFT RADIOCEPHALIC;  Surgeon: [name], MD;  Location: [facility];  Service: Cardiovascular;  Laterality: Left;     Current Meds  Medication Sig  . acetaminophen (TYLENOL) 500 MG tablet Take 1,000 mg by mouth every 6 (six) hours as needed for moderate pain or headache. Reported on 12/14/2015  . amLODipine (NORVASC) 5 MG tablet Take 1 tablet (5 mg total) by mouth daily.  [name] apixaban (ELIQUIS) 2.5 MG TABS tablet Take 1 tablet (2.5 mg total) by mouth 2 (two) times daily. (Patient taking differently: Take 2.5 mg by mouth See admin instructions. Mon, Wed, Fri, take once daily, all other days taking twice daily)  . aspirin 81 MG EC tablet Take 81 mg by mouth See admin instructions. Mon Wed Fri take 1 tablet daily, and Tues and Thurs take 1 tablet twice daily  Takes once a day if patient has Eliquis  . atorvastatin (LIPITOR) 10 MG tablet TAKE 1 TABLET BY MOUTH  DAILY (Patient taking differently: Take 10 mg  by mouth at bedtime. )  . B Complex-C-Folic Acid (DIALYVITE 253) 0.8 MG TABS Take 1 tablet by mouth daily.  . carvedilol (COREG) 25 MG tablet Take 0.5 tablets (12.5 mg total) by mouth 2 (two) times daily. If SBP > 120 (Patient taking differently: Take 25 mg by mouth in the morning and at bedtime. )  . ciprofloxacin (CIPRO) 500 MG tablet Take 1 tablet (500 mg total) by mouth 2 (two) times daily.  . Methoxy PEG-Epoetin Beta (MIRCERA IJ) Inject as directed as needed.   . venlafaxine XR (EFFEXOR-XR) 150 MG 24 hr capsule TAKE 1 CAPSULE BY MOUTH EVERY DAY  . zolpidem (AMBIEN) 10 MG tablet TAKE 1 TABLET BY MOUTH AT BEDTIME AS NEEDED FOR SLEEP.     Allergies:   Oxycodone   Social History   Tobacco Use  . Smoking status: Former Smoker    Years: 3.00    Types: Cigarettes    Quit date: 01/25/1991    Years since  quitting: 29.6  . Smokeless tobacco: Never Used  Vaping Use  . Vaping Use: Never used  Substance Use Topics  . Alcohol use: No    Alcohol/week: 0.0 standard drinks  . Drug use: No     Family Hx: The patient's family history includes Hypertension in his father and mother.  ROS:   Please see the history of present illness.     All other systems reviewed and are negative.   Prior CV studies:   The following studies were reviewed today: Notes from his follow-up visit with Dr. Ysidro Evert May 2019, CT of the aorta with stable aortic repair same date  Labs/Other Tests and Data Reviewed:    Cardiac/aortic MRI 10/17/2019: Cardiac MRI:  1. The left ventricle is normal in cavity size. There is concentric severe left ventricular hypertrophy with a septal wall thickness of 2.1 cm (prior 2.0 cm) with a calculated myocardial mass of 325g (prior 328g). Global systolic function is hyperdynamic. The LV ejection fraction is 80% (prior 81%). There are no regional wall motion abnormalities.  2. The right ventricle is normal in cavity size, wall thickness, and systolic function.  3. Mildly enlarged left atrium.  Normal sized right atrium  4. The aortic valve is trileaflet (s/p annuloplasty) in morphology and function. The peak aortic valve 3.5 m/s (prior 2.9 m/s). There is no significant valvular disease.  5. T2* was 36 ms (prior 42 ms), indicating the patient does not have iron overload.  6. Native T1 is 1482 ms (prior 1421 ms) (upper limit of normal is 1284 ms);  these findings could suggest cardiac amyloidosis. However, without gadolinium administration this finding cannot be confirmed.   Chest MRA  1. Postsurgical changes of aortic root replacement without evidence of complication.  There has been reimplantation of the coronary arteritis.    2. The aortic arch is left sided. There is normal branching of the arch vessels.  3. The main pulmonary artery is enlarged, which can suggest  pulmonary hypertension   4. Trace bilateral pleural effusions are present  5. Cholelithiasis, incompletely evaluated.   Bi-orthogonal luminal aortic dimensions with prior measurements below:  Sinus of Valsalva (largest dimension, cross-sectional dimension): 4.7 x 3.6 cm (previously 4.7 x 3.5 cm) Sinotubular junction: 2.7 x 2.5 cm (previously 2.7 x 2.5 cm) Mid-ascending aorta: 3.2 x 3.2 cm (previously 3.2 x 3.2 cm) Proximal aortic arch (aorta at the origin of the innominate artery): 3.0 x 3.1 cm (previously 3.3 x 3.2 cm) Mid-aortic arch (between left common  carotid and subclavian arteries): 2.9 x 2.9 cm (previously 3.2 x 3.1 cm) Proximal descending thoracic aorta: 2.3 x 2.4 cm (previously 2.7 x 2.6 cm) Mid-descending aorta: 2.5 x 2.5 cm (previously 2.7 x 2.7 cm)  Echocardiogram 04/22/2020   1. Left ventricular ejection fraction, by estimation, is 65 to 70%. The  left ventricle has normal function. The left ventricle has no regional  wall motion abnormalities. There is severe left ventricular hypertrophy.  Left ventricular diastolic parameters  are consistent with Grade I diastolic dysfunction (impaired relaxation).  2. Right ventricular systolic function is normal. The right ventricular  size is normal.  3. Left atrial size was moderately dilated.  4. Right atrial size was mildly dilated.  5. The mitral valve is abnormal. Trivial mitral valve regurgitation.  6. The aortic valve has been repaired/replaced. Aortic valve  regurgitation is mild. There is a 21 mm HAART ring valve present in the  aortic position. Aortic regurgitation PHT measures 435 msec. Aortic valve  mean gradient measures 14.0 mmHg. Aortic valve  Vmax measures 2.57 m/s.  7. Aortic root/ascending aorta has been repaired/replaced. This measures  normal in size.  8. The inferior vena cava is normal in size with greater than 50%  respiratory variability, suggesting right atrial pressure of 3 mmHg.    EKG:  ECG from 06/07/2020 shows sinus rhythm, left atrial abnormality, LVH with QRS widening/incomplete left bundle branch block, prominent second repolarization changes, QTC 483 ms.  Recent Labs: 06/18/2020: BUN 24; Creatinine, Ser 4.78; Hemoglobin 8.3; Platelets 278; Potassium 4.1; Sodium 138   Recent Lipid Panel Lab Results  Component Value Date/Time   CHOL 140 02/29/2012 08:29 AM   TRIG 72 02/29/2012 08:29 AM   HDL 42 02/29/2012 08:29 AM   CHOLHDL 3.3 02/29/2012 08:29 AM   LDLCALC 84 02/29/2012 08:29 AM    Wt Readings from Last 3 Encounters:  09/23/20 (!) 310 lb 12.8 oz (141 kg)  08/12/20 (!) 320 lb 3.2 oz (145.2 kg)  06/29/20 (!) 327 lb (148.3 kg)     Objective:    Vital Signs:  BP (!) 136/94   Pulse (!) 101   Ht 6' (1.829 m)   Wt (!) 310 lb 12.8 oz (141 kg)   SpO2 98%   BMI 42.15 kg/m    General: Alert, oriented x3, no distress, morbidly obese Head: no evidence of trauma, PERRL, EOMI, no exophtalmos or lid lag, no myxedema, no xanthelasma; normal ears, nose and oropharynx Neck: normal jugular venous pulsations and no hepatojugular reflux; brisk carotid pulses without delay and no carotid bruits Chest: clear to auscultation, no signs of consolidation by percussion or palpation, normal fremitus, symmetrical and full respiratory excursions Cardiovascular: normal position and quality of the apical impulse, regular rhythm, normal first and second heart sounds, no murmurs, rubs or gallops.  He has scars of multiple AV fistula up and down his left upper extremity from the wrist to the shoulder.  These are all aneurysmal and many of them are clotted.  The current fistula that is in use is close to his shoulder and has an excellent thrill and bruit, with a continuous murmur that radiates throughout his entire chest Abdomen: no tenderness or distention, no masses by palpation, no abnormal pulsatility or arterial bruits, normal bowel sounds, no hepatosplenomegaly Extremities: no clubbing,  cyanosis or edema; 2+ radial, ulnar and brachial pulses bilaterally; 2+ right femoral, posterior tibial and dorsalis pedis pulses; 2+ left femoral, posterior tibial and dorsalis pedis pulses; no subclavian or femoral bruits  Neurological: grossly nonfocal Psych: Normal mood and affect    ASSESSMENT & PLAN:    1. CHF: Chronic diastolic heart failure due to severe left ventricular hypertrophy, at least partly due to longstanding hypertension that was insufficiently treated.  His MRI findings do raise concern for possible cardiac amyloidosis (gadolinium could not be administered), but the technetium pyrophosphate scan did not show evidence of this disorder in my opinion.  If he had amyloidosis his disease course would have been much more aggressive.  We will schedule for a pyrophosphate scan.  His echocardiogram showed severe LVH consistent with hypertensive heart disease and only grade 1 diastolic dysfunction, which would be surprising with amyloidosis. 2. ESRD: On hemodialysis Monday Wednesday Friday, currently through a right subclavian catheter until his left upper arm AV fistula is fully mature.  Still makes urine approximately twice a day.  Fatigue has improved "80%" since starting hemodialysis.   3. Long QT: Mild, most recent ECG shows QTC 484 ms.  Avoid hypokalemia.  Avoid additional QT prolonging drugs. 4. HTN: Sometimes a little higher on days between dialysis, but often low after dialysis.  No change in medications. 5. Asc Ao aneurysm: s/p repair 2018.  Good findings at cardiac MRI in October 2020, at [facility]. 6. AI: s/p aortic valve annuloplasty repair with a 21 mm HAART 300 ring.  Mild iatrogenic aortic stenosis.  MRI reports aortic velocity has increased from 2.9-3.5 but also reports "no aortic valve disease".  Suspect this is due to increased cardiac output from his AV fistula.  Echocardiogram shows a mean gradient of only 14 mmHg. 7. AAA:  History of graft repair, recent sac enlargement.  No  evidence of type II endoleak has been found by invasive aortography at [facility] 8. DVT/PE:   He has had an episode of syncope earlier this year that was strongly suggestive of pulmonary embolism while he was not taking anticoagulation, although the diagnosis was not firmly confirmed.  Longstanding trouble finding a way to afford the Eliquis.  He has reportedly applied for patient assistance from the manufacturer multiple times and has been denied, although both the patient and his wife are on medical disability Medicare and Social Security and have limited income.  We will ask our social worker to see if she can assist. 9. Morbid obesity:  He has been steadily losing real weight 10 she will continue those efforts. 10. HLP: No recent lipid profile available for review.  He does not have known CAD, and his PAD is mostly manifested as aneurysms. Patient Instructions  Medication Instructions:  No changes *If you need a refill on your cardiac medications before your next appointment, please call your pharmacy*   Lab Work: None ordered If you have labs (blood work) drawn today and your tests are completely normal, you will receive your results only by: [name] MyChart Message (if you have MyChart) OR . A paper copy in the mail If you have any lab test that is abnormal or we need to change your treatment, we will call you to review the results.   Testing/Procedures: None ordered   Follow-Up: At [facility], you and your health needs are our priority.  As part of our continuing mission to provide you with exceptional heart care, we have created designated Provider Care Teams.  These Care Teams include your primary Cardiologist (physician) and Advanced Practice Providers (APPs -  Physician Assistants and Nurse Practitioners) who all work together to provide you with the care you need, when you  need it.  We recommend signing up for the patient portal called "MyChart".  Sign up information is  provided on this After Visit Summary.  MyChart is used to connect with patients for Virtual Visits (Telemedicine).  Patients are able to view lab/test results, encounter notes, upcoming appointments, etc.  Non-urgent messages can be sent to your provider as well.   To learn more about what you can do with MyChart, go to [email or web address].    Your next appointment:   12 month(s)  The format for your next appointment:   In Person  Provider:   You may see Sanda Klein, MD or one of the following Advanced Practice Providers on your designated Care Team:    Almyra Deforest, PA-C  Fabian Sharp, [place] or   Roby Lofts, PA-C    Other Instructions We will talk to one on our social workers about the cost of the Eliquis.   Signed, Sanda Klein, MD  09/25/2020 10:23 PM    [place]

## 2020-09-27 ENCOUNTER — Telehealth: Payer: Self-pay | Admitting: Licensed Clinical Social Worker

## 2020-09-27 DIAGNOSIS — N186 End stage renal disease: Secondary | ICD-10-CM | POA: Diagnosis not present

## 2020-09-27 DIAGNOSIS — D689 Coagulation defect, unspecified: Secondary | ICD-10-CM | POA: Diagnosis not present

## 2020-09-27 DIAGNOSIS — D631 Anemia in chronic kidney disease: Secondary | ICD-10-CM | POA: Diagnosis not present

## 2020-09-27 DIAGNOSIS — D509 Iron deficiency anemia, unspecified: Secondary | ICD-10-CM | POA: Diagnosis not present

## 2020-09-27 DIAGNOSIS — N2581 Secondary hyperparathyroidism of renal origin: Secondary | ICD-10-CM | POA: Diagnosis not present

## 2020-09-27 DIAGNOSIS — Z992 Dependence on renal dialysis: Secondary | ICD-10-CM | POA: Diagnosis not present

## 2020-09-27 NOTE — Telephone Encounter (Signed)
Received referral to assist patient with Eliquis. Patient reports he has been denied multiple times for Patient Assistance. CSW contacted Patient Assistance and mergerd call with patient. CSW informed that patient needs to complete a new application as last on file was 2020 and stated single household rather than married. Patient appears to have been denied not because of income but had not met the out of pocket expenses. Patient and wife will obtain needed documents and meet with CSW on Thursday September 30, 2020 to complete new application and submit to [facility] Patient Assistance Program . Raquel Sarna, [place], [place]

## 2020-09-29 DIAGNOSIS — N2581 Secondary hyperparathyroidism of renal origin: Secondary | ICD-10-CM | POA: Diagnosis not present

## 2020-09-29 DIAGNOSIS — D631 Anemia in chronic kidney disease: Secondary | ICD-10-CM | POA: Diagnosis not present

## 2020-09-29 DIAGNOSIS — D509 Iron deficiency anemia, unspecified: Secondary | ICD-10-CM | POA: Diagnosis not present

## 2020-09-29 DIAGNOSIS — D689 Coagulation defect, unspecified: Secondary | ICD-10-CM | POA: Diagnosis not present

## 2020-09-29 DIAGNOSIS — Z992 Dependence on renal dialysis: Secondary | ICD-10-CM | POA: Diagnosis not present

## 2020-09-29 DIAGNOSIS — N186 End stage renal disease: Secondary | ICD-10-CM | POA: Diagnosis not present

## 2020-09-30 ENCOUNTER — Telehealth (HOSPITAL_COMMUNITY): Payer: Self-pay | Admitting: Licensed Clinical Social Worker

## 2020-09-30 NOTE — Telephone Encounter (Signed)
CSW received message from patient that he will not make appointment today with CSW. CSW returned call and left message to reschedule appointment to complete application. Raquel Sarna, [place], [place]

## 2020-10-01 DIAGNOSIS — Z992 Dependence on renal dialysis: Secondary | ICD-10-CM | POA: Diagnosis not present

## 2020-10-01 DIAGNOSIS — D509 Iron deficiency anemia, unspecified: Secondary | ICD-10-CM | POA: Diagnosis not present

## 2020-10-01 DIAGNOSIS — D631 Anemia in chronic kidney disease: Secondary | ICD-10-CM | POA: Diagnosis not present

## 2020-10-01 DIAGNOSIS — D689 Coagulation defect, unspecified: Secondary | ICD-10-CM | POA: Diagnosis not present

## 2020-10-01 DIAGNOSIS — N186 End stage renal disease: Secondary | ICD-10-CM | POA: Diagnosis not present

## 2020-10-01 DIAGNOSIS — N2581 Secondary hyperparathyroidism of renal origin: Secondary | ICD-10-CM | POA: Diagnosis not present

## 2020-10-04 ENCOUNTER — Telehealth: Payer: Self-pay | Admitting: Licensed Clinical Social Worker

## 2020-10-04 DIAGNOSIS — D509 Iron deficiency anemia, unspecified: Secondary | ICD-10-CM | POA: Diagnosis not present

## 2020-10-04 DIAGNOSIS — N2581 Secondary hyperparathyroidism of renal origin: Secondary | ICD-10-CM | POA: Diagnosis not present

## 2020-10-04 DIAGNOSIS — N186 End stage renal disease: Secondary | ICD-10-CM | POA: Diagnosis not present

## 2020-10-04 DIAGNOSIS — Z992 Dependence on renal dialysis: Secondary | ICD-10-CM | POA: Diagnosis not present

## 2020-10-04 DIAGNOSIS — D689 Coagulation defect, unspecified: Secondary | ICD-10-CM | POA: Diagnosis not present

## 2020-10-04 DIAGNOSIS — D631 Anemia in chronic kidney disease: Secondary | ICD-10-CM | POA: Diagnosis not present

## 2020-10-04 NOTE — Telephone Encounter (Signed)
CSW contacted patient and left message for return call. CSW awaiting patient response to assist with completion of Eliquis patient assistance application. Raquel Sarna, [place], [place] '

## 2020-10-04 NOTE — Telephone Encounter (Signed)
Patient returned call and left message to say that he is experiencing some medical issues and will reach back out to CSW to complete application later in the week. Raquel Sarna, [place], [place]

## 2020-10-06 ENCOUNTER — Telehealth: Payer: Self-pay | Admitting: Licensed Clinical Social Worker

## 2020-10-06 DIAGNOSIS — D509 Iron deficiency anemia, unspecified: Secondary | ICD-10-CM | POA: Diagnosis not present

## 2020-10-06 DIAGNOSIS — Z992 Dependence on renal dialysis: Secondary | ICD-10-CM | POA: Diagnosis not present

## 2020-10-06 DIAGNOSIS — N186 End stage renal disease: Secondary | ICD-10-CM | POA: Diagnosis not present

## 2020-10-06 DIAGNOSIS — D631 Anemia in chronic kidney disease: Secondary | ICD-10-CM | POA: Diagnosis not present

## 2020-10-06 DIAGNOSIS — N2581 Secondary hyperparathyroidism of renal origin: Secondary | ICD-10-CM | POA: Diagnosis not present

## 2020-10-06 DIAGNOSIS — D689 Coagulation defect, unspecified: Secondary | ICD-10-CM | POA: Diagnosis not present

## 2020-10-06 NOTE — Telephone Encounter (Signed)
CSW received return call from patient stating that he will meet with CSW on Thursday October 14 @ 2pm to complete paperwork. CSW continues to follow. Raquel Sarna, [place], [place]

## 2020-10-08 DIAGNOSIS — Z992 Dependence on renal dialysis: Secondary | ICD-10-CM | POA: Diagnosis not present

## 2020-10-08 DIAGNOSIS — D509 Iron deficiency anemia, unspecified: Secondary | ICD-10-CM | POA: Diagnosis not present

## 2020-10-08 DIAGNOSIS — D631 Anemia in chronic kidney disease: Secondary | ICD-10-CM | POA: Diagnosis not present

## 2020-10-08 DIAGNOSIS — N186 End stage renal disease: Secondary | ICD-10-CM | POA: Diagnosis not present

## 2020-10-08 DIAGNOSIS — N2581 Secondary hyperparathyroidism of renal origin: Secondary | ICD-10-CM | POA: Diagnosis not present

## 2020-10-08 DIAGNOSIS — D689 Coagulation defect, unspecified: Secondary | ICD-10-CM | POA: Diagnosis not present

## 2020-10-11 DIAGNOSIS — Z992 Dependence on renal dialysis: Secondary | ICD-10-CM | POA: Diagnosis not present

## 2020-10-11 DIAGNOSIS — D689 Coagulation defect, unspecified: Secondary | ICD-10-CM | POA: Diagnosis not present

## 2020-10-11 DIAGNOSIS — D631 Anemia in chronic kidney disease: Secondary | ICD-10-CM | POA: Diagnosis not present

## 2020-10-11 DIAGNOSIS — D509 Iron deficiency anemia, unspecified: Secondary | ICD-10-CM | POA: Diagnosis not present

## 2020-10-11 DIAGNOSIS — N2581 Secondary hyperparathyroidism of renal origin: Secondary | ICD-10-CM | POA: Diagnosis not present

## 2020-10-11 DIAGNOSIS — N186 End stage renal disease: Secondary | ICD-10-CM | POA: Diagnosis not present

## 2020-10-13 DIAGNOSIS — N186 End stage renal disease: Secondary | ICD-10-CM | POA: Diagnosis not present

## 2020-10-13 DIAGNOSIS — D509 Iron deficiency anemia, unspecified: Secondary | ICD-10-CM | POA: Diagnosis not present

## 2020-10-13 DIAGNOSIS — D689 Coagulation defect, unspecified: Secondary | ICD-10-CM | POA: Diagnosis not present

## 2020-10-13 DIAGNOSIS — N2581 Secondary hyperparathyroidism of renal origin: Secondary | ICD-10-CM | POA: Diagnosis not present

## 2020-10-13 DIAGNOSIS — Z992 Dependence on renal dialysis: Secondary | ICD-10-CM | POA: Diagnosis not present

## 2020-10-13 DIAGNOSIS — D631 Anemia in chronic kidney disease: Secondary | ICD-10-CM | POA: Diagnosis not present

## 2020-10-14 DIAGNOSIS — H40003 Preglaucoma, unspecified, bilateral: Secondary | ICD-10-CM | POA: Diagnosis not present

## 2020-10-15 DIAGNOSIS — D631 Anemia in chronic kidney disease: Secondary | ICD-10-CM | POA: Diagnosis not present

## 2020-10-15 DIAGNOSIS — D509 Iron deficiency anemia, unspecified: Secondary | ICD-10-CM | POA: Diagnosis not present

## 2020-10-15 DIAGNOSIS — D689 Coagulation defect, unspecified: Secondary | ICD-10-CM | POA: Diagnosis not present

## 2020-10-15 DIAGNOSIS — N186 End stage renal disease: Secondary | ICD-10-CM | POA: Diagnosis not present

## 2020-10-15 DIAGNOSIS — Z992 Dependence on renal dialysis: Secondary | ICD-10-CM | POA: Diagnosis not present

## 2020-10-15 DIAGNOSIS — N2581 Secondary hyperparathyroidism of renal origin: Secondary | ICD-10-CM | POA: Diagnosis not present

## 2020-10-18 DIAGNOSIS — D509 Iron deficiency anemia, unspecified: Secondary | ICD-10-CM | POA: Diagnosis not present

## 2020-10-18 DIAGNOSIS — D689 Coagulation defect, unspecified: Secondary | ICD-10-CM | POA: Diagnosis not present

## 2020-10-18 DIAGNOSIS — D631 Anemia in chronic kidney disease: Secondary | ICD-10-CM | POA: Diagnosis not present

## 2020-10-18 DIAGNOSIS — N2581 Secondary hyperparathyroidism of renal origin: Secondary | ICD-10-CM | POA: Diagnosis not present

## 2020-10-18 DIAGNOSIS — Z992 Dependence on renal dialysis: Secondary | ICD-10-CM | POA: Diagnosis not present

## 2020-10-18 DIAGNOSIS — N186 End stage renal disease: Secondary | ICD-10-CM | POA: Diagnosis not present

## 2020-10-20 DIAGNOSIS — D509 Iron deficiency anemia, unspecified: Secondary | ICD-10-CM | POA: Diagnosis not present

## 2020-10-20 DIAGNOSIS — D631 Anemia in chronic kidney disease: Secondary | ICD-10-CM | POA: Diagnosis not present

## 2020-10-20 DIAGNOSIS — N186 End stage renal disease: Secondary | ICD-10-CM | POA: Diagnosis not present

## 2020-10-20 DIAGNOSIS — N2581 Secondary hyperparathyroidism of renal origin: Secondary | ICD-10-CM | POA: Diagnosis not present

## 2020-10-20 DIAGNOSIS — D689 Coagulation defect, unspecified: Secondary | ICD-10-CM | POA: Diagnosis not present

## 2020-10-20 DIAGNOSIS — Z992 Dependence on renal dialysis: Secondary | ICD-10-CM | POA: Diagnosis not present

## 2020-10-22 DIAGNOSIS — Z992 Dependence on renal dialysis: Secondary | ICD-10-CM | POA: Diagnosis not present

## 2020-10-22 DIAGNOSIS — D631 Anemia in chronic kidney disease: Secondary | ICD-10-CM | POA: Diagnosis not present

## 2020-10-22 DIAGNOSIS — D509 Iron deficiency anemia, unspecified: Secondary | ICD-10-CM | POA: Diagnosis not present

## 2020-10-22 DIAGNOSIS — D689 Coagulation defect, unspecified: Secondary | ICD-10-CM | POA: Diagnosis not present

## 2020-10-22 DIAGNOSIS — N186 End stage renal disease: Secondary | ICD-10-CM | POA: Diagnosis not present

## 2020-10-22 DIAGNOSIS — N2581 Secondary hyperparathyroidism of renal origin: Secondary | ICD-10-CM | POA: Diagnosis not present

## 2020-10-24 DIAGNOSIS — I129 Hypertensive chronic kidney disease with stage 1 through stage 4 chronic kidney disease, or unspecified chronic kidney disease: Secondary | ICD-10-CM | POA: Diagnosis not present

## 2020-10-24 DIAGNOSIS — N186 End stage renal disease: Secondary | ICD-10-CM | POA: Diagnosis not present

## 2020-10-24 DIAGNOSIS — Z992 Dependence on renal dialysis: Secondary | ICD-10-CM | POA: Diagnosis not present

## 2020-10-25 DIAGNOSIS — N2581 Secondary hyperparathyroidism of renal origin: Secondary | ICD-10-CM | POA: Diagnosis not present

## 2020-10-25 DIAGNOSIS — D631 Anemia in chronic kidney disease: Secondary | ICD-10-CM | POA: Diagnosis not present

## 2020-10-25 DIAGNOSIS — R52 Pain, unspecified: Secondary | ICD-10-CM | POA: Diagnosis not present

## 2020-10-25 DIAGNOSIS — D689 Coagulation defect, unspecified: Secondary | ICD-10-CM | POA: Diagnosis not present

## 2020-10-25 DIAGNOSIS — D509 Iron deficiency anemia, unspecified: Secondary | ICD-10-CM | POA: Diagnosis not present

## 2020-10-25 DIAGNOSIS — N186 End stage renal disease: Secondary | ICD-10-CM | POA: Diagnosis not present

## 2020-10-25 DIAGNOSIS — Z992 Dependence on renal dialysis: Secondary | ICD-10-CM | POA: Diagnosis not present

## 2020-10-27 DIAGNOSIS — R52 Pain, unspecified: Secondary | ICD-10-CM | POA: Diagnosis not present

## 2020-10-27 DIAGNOSIS — D631 Anemia in chronic kidney disease: Secondary | ICD-10-CM | POA: Diagnosis not present

## 2020-10-27 DIAGNOSIS — Z992 Dependence on renal dialysis: Secondary | ICD-10-CM | POA: Diagnosis not present

## 2020-10-27 DIAGNOSIS — D689 Coagulation defect, unspecified: Secondary | ICD-10-CM | POA: Diagnosis not present

## 2020-10-27 DIAGNOSIS — N2581 Secondary hyperparathyroidism of renal origin: Secondary | ICD-10-CM | POA: Diagnosis not present

## 2020-10-27 DIAGNOSIS — D509 Iron deficiency anemia, unspecified: Secondary | ICD-10-CM | POA: Diagnosis not present

## 2020-10-27 DIAGNOSIS — N186 End stage renal disease: Secondary | ICD-10-CM | POA: Diagnosis not present

## 2020-10-29 DIAGNOSIS — D509 Iron deficiency anemia, unspecified: Secondary | ICD-10-CM | POA: Diagnosis not present

## 2020-10-29 DIAGNOSIS — R52 Pain, unspecified: Secondary | ICD-10-CM | POA: Diagnosis not present

## 2020-10-29 DIAGNOSIS — N186 End stage renal disease: Secondary | ICD-10-CM | POA: Diagnosis not present

## 2020-10-29 DIAGNOSIS — D689 Coagulation defect, unspecified: Secondary | ICD-10-CM | POA: Diagnosis not present

## 2020-10-29 DIAGNOSIS — Z992 Dependence on renal dialysis: Secondary | ICD-10-CM | POA: Diagnosis not present

## 2020-10-29 DIAGNOSIS — N2581 Secondary hyperparathyroidism of renal origin: Secondary | ICD-10-CM | POA: Diagnosis not present

## 2020-10-29 DIAGNOSIS — D631 Anemia in chronic kidney disease: Secondary | ICD-10-CM | POA: Diagnosis not present

## 2020-11-01 DIAGNOSIS — D689 Coagulation defect, unspecified: Secondary | ICD-10-CM | POA: Diagnosis not present

## 2020-11-01 DIAGNOSIS — D509 Iron deficiency anemia, unspecified: Secondary | ICD-10-CM | POA: Diagnosis not present

## 2020-11-01 DIAGNOSIS — Z992 Dependence on renal dialysis: Secondary | ICD-10-CM | POA: Diagnosis not present

## 2020-11-01 DIAGNOSIS — R52 Pain, unspecified: Secondary | ICD-10-CM | POA: Diagnosis not present

## 2020-11-01 DIAGNOSIS — N2581 Secondary hyperparathyroidism of renal origin: Secondary | ICD-10-CM | POA: Diagnosis not present

## 2020-11-01 DIAGNOSIS — D631 Anemia in chronic kidney disease: Secondary | ICD-10-CM | POA: Diagnosis not present

## 2020-11-01 DIAGNOSIS — N186 End stage renal disease: Secondary | ICD-10-CM | POA: Diagnosis not present

## 2020-11-03 DIAGNOSIS — N186 End stage renal disease: Secondary | ICD-10-CM | POA: Diagnosis not present

## 2020-11-03 DIAGNOSIS — D509 Iron deficiency anemia, unspecified: Secondary | ICD-10-CM | POA: Diagnosis not present

## 2020-11-03 DIAGNOSIS — N2581 Secondary hyperparathyroidism of renal origin: Secondary | ICD-10-CM | POA: Diagnosis not present

## 2020-11-03 DIAGNOSIS — R52 Pain, unspecified: Secondary | ICD-10-CM | POA: Diagnosis not present

## 2020-11-03 DIAGNOSIS — D631 Anemia in chronic kidney disease: Secondary | ICD-10-CM | POA: Diagnosis not present

## 2020-11-03 DIAGNOSIS — D689 Coagulation defect, unspecified: Secondary | ICD-10-CM | POA: Diagnosis not present

## 2020-11-03 DIAGNOSIS — Z992 Dependence on renal dialysis: Secondary | ICD-10-CM | POA: Diagnosis not present

## 2020-11-04 ENCOUNTER — Telehealth: Payer: Self-pay | Admitting: *Deleted

## 2020-11-04 NOTE — Telephone Encounter (Signed)
Medication Samples have been provided to the patient. The patient knows to cut the tablets in half.   Drug name: Eliquis       Strength: 5 mg        Qty: 4 boxes  LOT: [id number]   Exp.Date: 10/23

## 2020-11-05 DIAGNOSIS — N186 End stage renal disease: Secondary | ICD-10-CM | POA: Diagnosis not present

## 2020-11-05 DIAGNOSIS — D509 Iron deficiency anemia, unspecified: Secondary | ICD-10-CM | POA: Diagnosis not present

## 2020-11-05 DIAGNOSIS — R52 Pain, unspecified: Secondary | ICD-10-CM | POA: Diagnosis not present

## 2020-11-05 DIAGNOSIS — Z992 Dependence on renal dialysis: Secondary | ICD-10-CM | POA: Diagnosis not present

## 2020-11-05 DIAGNOSIS — N2581 Secondary hyperparathyroidism of renal origin: Secondary | ICD-10-CM | POA: Diagnosis not present

## 2020-11-05 DIAGNOSIS — D689 Coagulation defect, unspecified: Secondary | ICD-10-CM | POA: Diagnosis not present

## 2020-11-05 DIAGNOSIS — D631 Anemia in chronic kidney disease: Secondary | ICD-10-CM | POA: Diagnosis not present

## 2020-11-08 DIAGNOSIS — N2581 Secondary hyperparathyroidism of renal origin: Secondary | ICD-10-CM | POA: Diagnosis not present

## 2020-11-08 DIAGNOSIS — D689 Coagulation defect, unspecified: Secondary | ICD-10-CM | POA: Diagnosis not present

## 2020-11-08 DIAGNOSIS — R52 Pain, unspecified: Secondary | ICD-10-CM | POA: Diagnosis not present

## 2020-11-08 DIAGNOSIS — N186 End stage renal disease: Secondary | ICD-10-CM | POA: Diagnosis not present

## 2020-11-08 DIAGNOSIS — D509 Iron deficiency anemia, unspecified: Secondary | ICD-10-CM | POA: Diagnosis not present

## 2020-11-08 DIAGNOSIS — Z992 Dependence on renal dialysis: Secondary | ICD-10-CM | POA: Diagnosis not present

## 2020-11-08 DIAGNOSIS — D631 Anemia in chronic kidney disease: Secondary | ICD-10-CM | POA: Diagnosis not present

## 2020-11-10 DIAGNOSIS — Z992 Dependence on renal dialysis: Secondary | ICD-10-CM | POA: Diagnosis not present

## 2020-11-10 DIAGNOSIS — N2581 Secondary hyperparathyroidism of renal origin: Secondary | ICD-10-CM | POA: Diagnosis not present

## 2020-11-10 DIAGNOSIS — D631 Anemia in chronic kidney disease: Secondary | ICD-10-CM | POA: Diagnosis not present

## 2020-11-10 DIAGNOSIS — D509 Iron deficiency anemia, unspecified: Secondary | ICD-10-CM | POA: Diagnosis not present

## 2020-11-10 DIAGNOSIS — R52 Pain, unspecified: Secondary | ICD-10-CM | POA: Diagnosis not present

## 2020-11-10 DIAGNOSIS — D689 Coagulation defect, unspecified: Secondary | ICD-10-CM | POA: Diagnosis not present

## 2020-11-10 DIAGNOSIS — N186 End stage renal disease: Secondary | ICD-10-CM | POA: Diagnosis not present

## 2020-11-11 DIAGNOSIS — Z8679 Personal history of other diseases of the circulatory system: Secondary | ICD-10-CM | POA: Diagnosis not present

## 2020-11-11 DIAGNOSIS — I714 Abdominal aortic aneurysm, without rupture: Secondary | ICD-10-CM | POA: Diagnosis not present

## 2020-11-11 DIAGNOSIS — Y832 Surgical operation with anastomosis, bypass or graft as the cause of abnormal reaction of the patient, or of later complication, without mention of misadventure at the time of the procedure: Secondary | ICD-10-CM | POA: Diagnosis not present

## 2020-11-11 DIAGNOSIS — I9789 Other postprocedural complications and disorders of the circulatory system, not elsewhere classified: Secondary | ICD-10-CM | POA: Diagnosis not present

## 2020-11-11 DIAGNOSIS — Z9889 Other specified postprocedural states: Secondary | ICD-10-CM | POA: Diagnosis not present

## 2020-11-12 DIAGNOSIS — N186 End stage renal disease: Secondary | ICD-10-CM | POA: Diagnosis not present

## 2020-11-12 DIAGNOSIS — D631 Anemia in chronic kidney disease: Secondary | ICD-10-CM | POA: Diagnosis not present

## 2020-11-12 DIAGNOSIS — N2581 Secondary hyperparathyroidism of renal origin: Secondary | ICD-10-CM | POA: Diagnosis not present

## 2020-11-12 DIAGNOSIS — D689 Coagulation defect, unspecified: Secondary | ICD-10-CM | POA: Diagnosis not present

## 2020-11-12 DIAGNOSIS — R52 Pain, unspecified: Secondary | ICD-10-CM | POA: Diagnosis not present

## 2020-11-12 DIAGNOSIS — D509 Iron deficiency anemia, unspecified: Secondary | ICD-10-CM | POA: Diagnosis not present

## 2020-11-12 DIAGNOSIS — Z992 Dependence on renal dialysis: Secondary | ICD-10-CM | POA: Diagnosis not present

## 2020-11-14 DIAGNOSIS — N186 End stage renal disease: Secondary | ICD-10-CM | POA: Diagnosis not present

## 2020-11-14 DIAGNOSIS — R52 Pain, unspecified: Secondary | ICD-10-CM | POA: Diagnosis not present

## 2020-11-14 DIAGNOSIS — D509 Iron deficiency anemia, unspecified: Secondary | ICD-10-CM | POA: Diagnosis not present

## 2020-11-14 DIAGNOSIS — D631 Anemia in chronic kidney disease: Secondary | ICD-10-CM | POA: Diagnosis not present

## 2020-11-14 DIAGNOSIS — N2581 Secondary hyperparathyroidism of renal origin: Secondary | ICD-10-CM | POA: Diagnosis not present

## 2020-11-14 DIAGNOSIS — Z992 Dependence on renal dialysis: Secondary | ICD-10-CM | POA: Diagnosis not present

## 2020-11-14 DIAGNOSIS — D689 Coagulation defect, unspecified: Secondary | ICD-10-CM | POA: Diagnosis not present

## 2020-11-16 DIAGNOSIS — D509 Iron deficiency anemia, unspecified: Secondary | ICD-10-CM | POA: Diagnosis not present

## 2020-11-16 DIAGNOSIS — N186 End stage renal disease: Secondary | ICD-10-CM | POA: Diagnosis not present

## 2020-11-16 DIAGNOSIS — N2581 Secondary hyperparathyroidism of renal origin: Secondary | ICD-10-CM | POA: Diagnosis not present

## 2020-11-16 DIAGNOSIS — R52 Pain, unspecified: Secondary | ICD-10-CM | POA: Diagnosis not present

## 2020-11-16 DIAGNOSIS — D689 Coagulation defect, unspecified: Secondary | ICD-10-CM | POA: Diagnosis not present

## 2020-11-16 DIAGNOSIS — Z992 Dependence on renal dialysis: Secondary | ICD-10-CM | POA: Diagnosis not present

## 2020-11-16 DIAGNOSIS — D631 Anemia in chronic kidney disease: Secondary | ICD-10-CM | POA: Diagnosis not present

## 2020-11-19 DIAGNOSIS — N186 End stage renal disease: Secondary | ICD-10-CM | POA: Diagnosis not present

## 2020-11-19 DIAGNOSIS — D689 Coagulation defect, unspecified: Secondary | ICD-10-CM | POA: Diagnosis not present

## 2020-11-19 DIAGNOSIS — Z992 Dependence on renal dialysis: Secondary | ICD-10-CM | POA: Diagnosis not present

## 2020-11-19 DIAGNOSIS — N2581 Secondary hyperparathyroidism of renal origin: Secondary | ICD-10-CM | POA: Diagnosis not present

## 2020-11-19 DIAGNOSIS — D509 Iron deficiency anemia, unspecified: Secondary | ICD-10-CM | POA: Diagnosis not present

## 2020-11-19 DIAGNOSIS — D631 Anemia in chronic kidney disease: Secondary | ICD-10-CM | POA: Diagnosis not present

## 2020-11-19 DIAGNOSIS — R52 Pain, unspecified: Secondary | ICD-10-CM | POA: Diagnosis not present

## 2020-11-22 DIAGNOSIS — D689 Coagulation defect, unspecified: Secondary | ICD-10-CM | POA: Diagnosis not present

## 2020-11-22 DIAGNOSIS — D509 Iron deficiency anemia, unspecified: Secondary | ICD-10-CM | POA: Diagnosis not present

## 2020-11-22 DIAGNOSIS — R52 Pain, unspecified: Secondary | ICD-10-CM | POA: Diagnosis not present

## 2020-11-22 DIAGNOSIS — N2581 Secondary hyperparathyroidism of renal origin: Secondary | ICD-10-CM | POA: Diagnosis not present

## 2020-11-22 DIAGNOSIS — Z992 Dependence on renal dialysis: Secondary | ICD-10-CM | POA: Diagnosis not present

## 2020-11-22 DIAGNOSIS — D631 Anemia in chronic kidney disease: Secondary | ICD-10-CM | POA: Diagnosis not present

## 2020-11-22 DIAGNOSIS — N186 End stage renal disease: Secondary | ICD-10-CM | POA: Diagnosis not present

## 2020-11-23 DIAGNOSIS — N186 End stage renal disease: Secondary | ICD-10-CM | POA: Diagnosis not present

## 2020-11-23 DIAGNOSIS — I129 Hypertensive chronic kidney disease with stage 1 through stage 4 chronic kidney disease, or unspecified chronic kidney disease: Secondary | ICD-10-CM | POA: Diagnosis not present

## 2020-11-23 DIAGNOSIS — Z992 Dependence on renal dialysis: Secondary | ICD-10-CM | POA: Diagnosis not present

## 2020-11-24 DIAGNOSIS — D631 Anemia in chronic kidney disease: Secondary | ICD-10-CM | POA: Diagnosis not present

## 2020-11-24 DIAGNOSIS — D509 Iron deficiency anemia, unspecified: Secondary | ICD-10-CM | POA: Diagnosis not present

## 2020-11-24 DIAGNOSIS — Z992 Dependence on renal dialysis: Secondary | ICD-10-CM | POA: Diagnosis not present

## 2020-11-24 DIAGNOSIS — N2581 Secondary hyperparathyroidism of renal origin: Secondary | ICD-10-CM | POA: Diagnosis not present

## 2020-11-24 DIAGNOSIS — N186 End stage renal disease: Secondary | ICD-10-CM | POA: Diagnosis not present

## 2020-11-25 ENCOUNTER — Other Ambulatory Visit: Payer: Self-pay

## 2020-11-26 DIAGNOSIS — N2581 Secondary hyperparathyroidism of renal origin: Secondary | ICD-10-CM | POA: Diagnosis not present

## 2020-11-26 DIAGNOSIS — N186 End stage renal disease: Secondary | ICD-10-CM | POA: Diagnosis not present

## 2020-11-26 DIAGNOSIS — Z992 Dependence on renal dialysis: Secondary | ICD-10-CM | POA: Diagnosis not present

## 2020-11-26 DIAGNOSIS — D509 Iron deficiency anemia, unspecified: Secondary | ICD-10-CM | POA: Diagnosis not present

## 2020-11-26 DIAGNOSIS — D631 Anemia in chronic kidney disease: Secondary | ICD-10-CM | POA: Diagnosis not present

## 2020-11-29 DIAGNOSIS — N2581 Secondary hyperparathyroidism of renal origin: Secondary | ICD-10-CM | POA: Diagnosis not present

## 2020-11-29 DIAGNOSIS — D631 Anemia in chronic kidney disease: Secondary | ICD-10-CM | POA: Diagnosis not present

## 2020-11-29 DIAGNOSIS — D509 Iron deficiency anemia, unspecified: Secondary | ICD-10-CM | POA: Diagnosis not present

## 2020-11-29 DIAGNOSIS — N186 End stage renal disease: Secondary | ICD-10-CM | POA: Diagnosis not present

## 2020-11-29 DIAGNOSIS — Z992 Dependence on renal dialysis: Secondary | ICD-10-CM | POA: Diagnosis not present

## 2020-11-29 MED ORDER — VENLAFAXINE HCL ER 150 MG PO CP24: ORAL_CAPSULE | ORAL | 3 refills | Status: DC

## 2020-11-29 MED ORDER — ZOLPIDEM TARTRATE 10 MG PO TABS: 10.0000 mg | ORAL_TABLET | Freq: Every evening | ORAL | 1 refills | Status: DC | PRN

## 2020-12-01 ENCOUNTER — Telehealth: Payer: Self-pay

## 2020-12-01 DIAGNOSIS — N186 End stage renal disease: Secondary | ICD-10-CM | POA: Diagnosis not present

## 2020-12-01 DIAGNOSIS — D509 Iron deficiency anemia, unspecified: Secondary | ICD-10-CM | POA: Diagnosis not present

## 2020-12-01 DIAGNOSIS — D61818 Other pancytopenia: Secondary | ICD-10-CM

## 2020-12-01 DIAGNOSIS — N2581 Secondary hyperparathyroidism of renal origin: Secondary | ICD-10-CM | POA: Diagnosis not present

## 2020-12-01 DIAGNOSIS — Z992 Dependence on renal dialysis: Secondary | ICD-10-CM | POA: Diagnosis not present

## 2020-12-01 DIAGNOSIS — D631 Anemia in chronic kidney disease: Secondary | ICD-10-CM | POA: Diagnosis not present

## 2020-12-01 NOTE — Telephone Encounter (Signed)
Received a call from [place] at [facility]. She stated that they received bloodwork from the patients dialysis center and in August 2021 his platelets were 121 and now in December his platelets are 44.  Concerned because his platelets has dropped so much in the past few months.   Please advise this

## 2020-12-02 NOTE — Telephone Encounter (Signed)
I have referred him to Hematology to evaluate pancytopenia (including platelets)

## 2020-12-02 NOTE — Telephone Encounter (Signed)
Spoke with patient and informed him that a referral has been sent to Hematology and someone will be in contact to schedule his first appointment.

## 2020-12-03 ENCOUNTER — Telehealth: Payer: Self-pay | Admitting: Physician Assistant

## 2020-12-03 DIAGNOSIS — Z992 Dependence on renal dialysis: Secondary | ICD-10-CM | POA: Diagnosis not present

## 2020-12-03 DIAGNOSIS — D509 Iron deficiency anemia, unspecified: Secondary | ICD-10-CM | POA: Diagnosis not present

## 2020-12-03 DIAGNOSIS — N186 End stage renal disease: Secondary | ICD-10-CM | POA: Diagnosis not present

## 2020-12-03 DIAGNOSIS — D631 Anemia in chronic kidney disease: Secondary | ICD-10-CM | POA: Diagnosis not present

## 2020-12-03 DIAGNOSIS — N2581 Secondary hyperparathyroidism of renal origin: Secondary | ICD-10-CM | POA: Diagnosis not present

## 2020-12-03 NOTE — Telephone Encounter (Signed)
Received a new hem referral from Dr. Sarajane Jews for pancytopenia. [name] has been cld and scheduled to see [name] on 12/20 at 1:30pm w/labs at 1pm. Pt aware to arrive 30 minutes early.

## 2020-12-06 DIAGNOSIS — D631 Anemia in chronic kidney disease: Secondary | ICD-10-CM | POA: Diagnosis not present

## 2020-12-06 DIAGNOSIS — Z992 Dependence on renal dialysis: Secondary | ICD-10-CM | POA: Diagnosis not present

## 2020-12-06 DIAGNOSIS — D509 Iron deficiency anemia, unspecified: Secondary | ICD-10-CM | POA: Diagnosis not present

## 2020-12-06 DIAGNOSIS — N2581 Secondary hyperparathyroidism of renal origin: Secondary | ICD-10-CM | POA: Diagnosis not present

## 2020-12-06 DIAGNOSIS — N186 End stage renal disease: Secondary | ICD-10-CM | POA: Diagnosis not present

## 2020-12-08 DIAGNOSIS — D509 Iron deficiency anemia, unspecified: Secondary | ICD-10-CM | POA: Diagnosis not present

## 2020-12-08 DIAGNOSIS — Z992 Dependence on renal dialysis: Secondary | ICD-10-CM | POA: Diagnosis not present

## 2020-12-08 DIAGNOSIS — N2581 Secondary hyperparathyroidism of renal origin: Secondary | ICD-10-CM | POA: Diagnosis not present

## 2020-12-08 DIAGNOSIS — N186 End stage renal disease: Secondary | ICD-10-CM | POA: Diagnosis not present

## 2020-12-08 DIAGNOSIS — D631 Anemia in chronic kidney disease: Secondary | ICD-10-CM | POA: Diagnosis not present

## 2020-12-10 DIAGNOSIS — D631 Anemia in chronic kidney disease: Secondary | ICD-10-CM | POA: Diagnosis not present

## 2020-12-10 DIAGNOSIS — Z992 Dependence on renal dialysis: Secondary | ICD-10-CM | POA: Diagnosis not present

## 2020-12-10 DIAGNOSIS — D509 Iron deficiency anemia, unspecified: Secondary | ICD-10-CM | POA: Diagnosis not present

## 2020-12-10 DIAGNOSIS — N186 End stage renal disease: Secondary | ICD-10-CM | POA: Diagnosis not present

## 2020-12-10 DIAGNOSIS — N2581 Secondary hyperparathyroidism of renal origin: Secondary | ICD-10-CM | POA: Diagnosis not present

## 2020-12-10 NOTE — Progress Notes (Signed)
[place] Telephone:([phone]   Fax:([phone]) [phone]  CONSULT NOTE  REFERRING PHYSICIAN: Dr. Sarajane Jews  REASON FOR CONSULTATION:  "pancytopenia"  HPI [name] is a 49 y.o. male with a past medical history significant for end-stage renal disease on hemodialysis M-W-F, abdominal aortic aneurysm, hypertension, heart failure, DVT, erectile dysfunction, gout, and anxiety was referred to the clinic for evaluation of pancytopenia.  The patient is currently under the care of Dr. Hollie Salk from nephrology [facility]) for his end-stage renal disease.  The patient receives dialysis on M-W-F.  It was noted on 12/02/2020 that the patient platelet count had dropped to 44,000 per a telephone note to that patient's PCP.  The results were relayed to the patient's PCP, Dr. Sarajane Jews an, who subsequently referred the patient to hematology today. The patient states that he was supposed to have an abdominal aneurysm operated on at [facility]; however, they are going to hold on the procedure until his platelets can be evaluated. He states there is a leak with his prior aneurysm repair.   The referral indicates that the reason for the referral is pancytopenia; however, I do not have records that show leukocytopenia. Regarding the thrombocytopenia, the patient states that in the last 1-2 months, he has had a platelet count between 40-52k on a few occassions. Otherwise, he denies prior history of thrombocytopenia. Per chart review, he had periods of mild thrombocytopenia starting in 2014, however, his platelet count always remained above 100k. There was no records from 2014-2021. his last set of labs in [place] are from 06/18/20 which shows a WBC count within normal limits at 5.9, anemia with a hemoglobin of 8.3, and a platelet count WNL at 278k. The patient was referred to our clinic today for further evaluation and recommendations regarding further work-up. It appears that the patient has had anemia for  about 11 years. He states he receives IV iron infusions at dialysis. Of note, the patient used to receive retacrit before having dialysis. It appears he received retacrit from 10/16/20- 12/29/19.  Overall, the patient is feeling fatigued. He reports dizzy spells after dialysis. He also reports weakness after dialysis. He denies any recent or history of viral infections including HIV, Hepatitis, or Mono.  The patient denies any recent fevers or chills. He states he had night sweats approximately 2x  That started a few weeks ago. He states that since starting dialysis, he lost about 70-80lbs.  He denies any lymphadenopathy.  He denies any frequent infections except he had at least 2 episodes of a UTI this past year. Denies upper respiratoy infections, skin infections, or GI infections.  He denies any nausea, vomiting, diarrhea, or constipation.  He denies any abnormal bleeding to his knowledge; however, the patient's wife states he had dark stools. The patient states that ever since he started taking phosphorous pills his stools are dark but will return to a normal color when he stops his phosphorus pills. The patient was supposed to have a routine colonoscopy last year but it was cancelled due to COVID-19.  He reports he bruises easily due to taking eliquis. He denies any herbal supplements. He denies new medications. He denies any history of autoimmune disorders.   The patient's family history consists of a mother who had hypertension as well as renal cell carcinoma.  The patient's father had hypertension atrial fibrillation.  The patient does not know his family's medical history.  The patient denies any family history of hematologic malignancies or bone marrow  disorders.  The patient is disabled.  He is married.  He denies any drug use.  The patient has not drank alcohol in approximately 25 years.  The patient also had a very brief smoking history over 25 years ago.  He estimates that he smoked approximately  3 or 4 years for a total of 2 packs/day.    HPI  Past Medical History:  Diagnosis Date  . Abdominal aortic aneurysm ([place])   . Anemia   . Anxiety   . Aortic regurgitation 12/10/08   Echo: mild to mod. AOV regurg,mild AO root dilatation, LA mildly dilated, EF 65%, LV wall thickness markedly increased, small PE  . Chronic kidney disease   . Chronic renal disease    advanced  . Complication of anesthesia    slow to awaken 1 time  . DVT (deep venous thrombosis) ([place])   . H/O aortic dissection 2009   infarenal abdominal aortic dissection  . History of pulmonary embolus (PE) 2010  . Hypertension    dr [name]  . Left ventricular hypertrophy   . Obesity   . Stroke [facility]) 2009   pt says no stroke    Past Surgical History:  Procedure Laterality Date  . ABDOMINAL AORTAGRAM N/A 02/06/2012   Procedure: ABDOMINAL Maxcine Ham;  Surgeon: [name], MD;  Location: [facility] CATH LAB;  Service: Cardiovascular;  Laterality: N/A;  . ABDOMINAL AORTIC ANEURYSM REPAIR  09/25/2012   EVAR  . AORTIC ROOT REPLACEMENT  05/07/2017   s/p valve sparing root replacement with coronary reconstruction along with repair of aortic valve with a 21MM HAART 300 aortic ring annuloplasty at [facility]  . AV FISTULA PLACEMENT Left 05/19/2015   Procedure: ARTERIOVENOUS (AV) FISTULA CREATION-LEFT RADIOCEPHALIC;  Surgeon: Mal [name], MD;  Location: [place];  Service: Vascular;  Laterality: Left;  . BASCILIC VEIN TRANSPOSITION Left 03/11/2020   Procedure: LEFT ARM ARTERIOVENOUS FISTULA  TRANSPOSITION;  Surgeon: [name], MD;  Location: [place];  Service: Vascular;  Laterality: Left;  . CARDIAC CATHETERIZATION  02/11/10   false + Nuc  . CORNEAL TRANSPLANT Left 1997  . FISTULA SUPERFICIALIZATION Left 08/04/2015   Procedure: FISTULA SUPERFICIALIZATION LEFT RADIAL CEPHALIC;  Surgeon: Mal [name], MD;  Location: [place];  Service: Vascular;  Laterality: Left;  . INTRAVASCULAR ULTRASOUND  09/25/2012   Procedure: INTRAVASCULAR  ULTRASOUND;  Surgeon: Mal [name], MD;  Location: [place];  Service: Vascular;  Laterality: N/A;  . IR ANGIOGRAM PELVIS SELECTIVE OR SUPRASELECTIVE  06/18/2020  . IR ANGIOGRAM SELECTIVE EACH ADDITIONAL VESSEL  06/18/2020  . IR AORTAGRAM ABDOMINAL SERIALOGRAM  06/18/2020  . IR RADIOLOGIST EVAL & MGMT  06/08/2020  . IR US GUIDE VASC ACCESS RIGHT  06/18/2020  . LIGATION OF COMPETING BRANCHES OF ARTERIOVENOUS FISTULA Left 08/04/2015   Procedure: LIGATION OF MULTIPLE COMPETING BRANCHES OF ARTERIOVENOUS FISTULA;  Surgeon: Mal [name], MD;  Location: [place];  Service: Vascular;  Laterality: Left;  . RESECTION OF ARTERIOVENOUS FISTULA ANEURYSM Left 01/02/2018   Procedure: RESECTION OF ARTERIOVENOUS FISTULA ANEURYSM LEFT RADIOCEPHALIC;  Surgeon: [name], MD;  Location: [facility] OR;  Service: Cardiovascular;  Laterality: Left;    Family History  Problem Relation Age of Onset  . Hypertension Mother   . Hypertension Father     Social History Social History   Tobacco Use  . Smoking status: Former Smoker    Years: 3.00    Types: Cigarettes    Quit date: 01/25/1991    Years since quitting: 29.9  .  Smokeless tobacco: Never Used  Vaping Use  . Vaping Use: Never used  Substance Use Topics  . Alcohol use: No    Alcohol/week: 0.0 standard drinks  . Drug use: No    Allergies  Allergen Reactions  . Oxycodone Hives    Current Outpatient Medications  Medication Sig Dispense Refill  . acetaminophen (TYLENOL) 500 MG tablet Take 1,000 mg by mouth every 6 (six) hours as needed for moderate pain or headache. Reported on 12/14/2015    . apixaban (ELIQUIS) 2.5 MG TABS tablet Take 1 tablet (2.5 mg total) by mouth 2 (two) times daily. (Patient taking differently: Take 2.5 mg by mouth See admin instructions. Mon, Wed, Fri, take once daily, all other days taking twice daily) 60 tablet   . atorvastatin (LIPITOR) 10 MG tablet TAKE 1 TABLET BY MOUTH  DAILY (Patient taking differently: Take 10 mg by mouth at  bedtime.) 90 tablet 3  . carvedilol (COREG) 25 MG tablet Take 0.5 tablets (12.5 mg total) by mouth 2 (two) times daily. If SBP > 120 (Patient taking differently: Take 25 mg by mouth in the morning and at bedtime.) 180 tablet 3  . ciprofloxacin (CIPRO) 500 MG tablet Take 1 tablet (500 mg total) by mouth 2 (two) times daily. 20 tablet 0  . Methoxy PEG-Epoetin Beta (MIRCERA IJ) Inject as directed as needed.     . venlafaxine XR (EFFEXOR-XR) 150 MG 24 hr capsule TAKE 1 CAPSULE BY MOUTH EVERY DAY 90 capsule 3  . zolpidem (AMBIEN) 10 MG tablet Take 1 tablet (10 mg total) by mouth at bedtime as needed. for sleep 90 tablet 1  . amLODipine (NORVASC) 5 MG tablet Take 1 tablet (5 mg total) by mouth daily. 180 tablet 3   No current facility-administered medications for this visit.    REVIEW OF SYSTEMS:   Review of Systems  Constitutional: Positive for fatigue and 70-80lb weight loss. Negative for appetite change, chills, and fever.  HENT: Negative for mouth sores, nosebleeds, sore throat and trouble swallowing.   Eyes: Negative for eye problems and icterus.  Respiratory: Negative for cough, hemoptysis, shortness of breath and wheezing.   Cardiovascular: Negative for chest pain and leg swelling.  Gastrointestinal: Positive for questionable dark stools. Negative for abdominal pain, constipation, diarrhea, nausea and vomiting.  Genitourinary: Negative for bladder incontinence, difficulty urinating, dysuria, frequency and hematuria.   Musculoskeletal: Negative for back pain, gait problem, neck pain and neck stiffness.  Skin: Negative for itching and rash.  Neurological: Positive for dizziness/lightheadedness after dialysis. Negative for  extremity weakness, gait problem, headaches, and seizures.  Hematological: Reports easy bruising. Negative for adenopathy. Does not bruise/bleed easily.  Psychiatric/Behavioral: Negative for confusion, depression and sleep disturbance. The patient is not nervous/anxious.      PHYSICAL EXAMINATION:  Blood pressure (!) 146/94, pulse 96, temperature 97.7 F (36.5 C), temperature source Tympanic, resp. rate 18, height 6' (1.829 m), weight (!) 315 lb 4.8 oz (143 kg), SpO2 100 %.  ECOG PERFORMANCE STATUS: 0-1  Physical Exam  Constitutional: Oriented to person, place, and time and well-developed, well-nourished, and in no distress.  HENT:  Head: Normocephalic and atraumatic.  Mouth/Throat: Oropharynx is clear and moist. No oropharyngeal exudate.  Eyes: Conjunctivae are normal. Right eye exhibits no discharge. Left eye exhibits no discharge. No scleral icterus.  Neck: Normal range of motion. Neck supple.  Cardiovascular: Normal rate, regular rhythm, normal heart sounds and intact distal pulses.   Pulmonary/Chest: Effort normal and breath sounds normal. No respiratory distress. No  wheezes. No rales.  Abdominal: Soft. Bowel sounds are normal. Exhibits no distension and no mass. There is no tenderness.  Musculoskeletal: Normal range of motion. Exhibits no edema.  Lymphadenopathy:    No cervical adenopathy.  Neurological: Alert and oriented to person, place, and time. Exhibits normal muscle tone. Gait normal. Coordination normal.  Skin: Skin is warm and dry. No rash noted. Not diaphoretic. No erythema. No pallor. One bruise on upper right arm.  Psychiatric: Mood, memory and judgment normal.  Vitals reviewed.  LABORATORY DATA: Lab Results  Component Value Date   WBC 4.1 12/13/2020   HGB 10.3 (L) 12/13/2020   HCT 31.1 (L) 12/13/2020   MCV 87.4 12/13/2020   PLT 124 (L) 12/13/2020      Chemistry      Component Value Date/Time   NA 144 12/13/2020 1256   K 3.8 12/13/2020 1256   CL 105 12/13/2020 1256   CO2 31 12/13/2020 1256   BUN 13 12/13/2020 1256   CREATININE 4.45 (HH) 12/13/2020 1256   CREATININE 2.11 (H) 08/20/2014 0804      Component Value Date/Time   CALCIUM 8.5 (L) 12/13/2020 1256   ALKPHOS 72 12/13/2020 1256   AST 14 (L) 12/13/2020 1256   ALT  12 12/13/2020 1256   BILITOT 0.6 12/13/2020 1256       RADIOGRAPHIC STUDIES: No results found.  ASSESSMENT: This is a very pleasant 49 year old African-American male referred to the clinic for evaluation of thrombocytopenia and anemia.   PLAN: The patient was seen with Dr. Julien Nordmann today.  Several lab studies were performed today including a CBC, CMP, LDH, folate, B12, iron studies, ferritin, HIV testing, rheumatoid factor, ANA, and hepatitis panel.   The patient CBC today shows a normal WBC count of 4.1, ANC within normal limits of 2.6,  anemia with a hemoglobin of 10.3 and an MCV WNL of 87.4. His anemia is to be expected given his chronic kidney disease. His labs show mild thrombocytopenia with a platelet count of 124k.  The patient's vitamin B12 was WNL as well as his folate. The patient's CMP showed ESRD. Tthe patient's  HIV testing and hepatitis panel is still pending.  His iron studies do not show any evidence of iron deficiency anemia. His ferritin is very elevated likely secondary to his chronic disease.  Dr. Julien Nordmann does not feel that a bone marrow biopsy and aspirate is warranted at this time int he absence of pancytopenia. As discussed above, his anemia is expected given his chronic disease from his ESRD. He has mild thrombocytopenia; however, no treatment is necessary at this time. We recommend that he continue on observation. His HIV, Hepatitis panel, RF, and ANA are still pending at this time. If any abnormalities, I will call the patient. Otherwise, no further workup or treatment necessary.   We will not schedule any follow up appointments at this time; however, we would be happy to see the patient back on an as needed basis if he were to develop worsening thrombocytopenia, anemia, and/or leukocytopenia.  The patient voices understanding of current disease status and treatment options and is in agreement with the current care plan.  All questions were answered. The patient  knows to call the clinic with any problems, questions or concerns. We can certainly see the patient much sooner if necessary.  Thank you so much for allowing me to participate in the care of [name]. I will continue to follow up the patient with you and assist in his care.  The total time spent in the appointment was 60 minutes.  Disclaimer: This note was dictated with voice recognition software. Similar sounding words can inadvertently be transcribed and may not be corrected upon review.   Alya Smaltz L Lashanna Angelo December 13, 2020, 3:35 PM  ADDENDUM: Hematology/Oncology Attending: I had a face-to-face encounter with the patient today.  I recommended his care plan.  This is a very pleasant 49 years old African-American male with end-stage renal disease and he is currently on hemodialysis on Monday, Wednesday and Friday weekly.  The patient was noticed by his nephrologist during one of the dialysis session to have low platelets count down to 40,000.  He was referred to his primary care physician for further evaluation.  The patient was then referred to [place] today for evaluation of a diagnosis of pancytopenia. The patient also has a leaking abdominal aneurysm and he was supposed to have repair surgery at [facility] but this is currently on hold because of the suspicious pancytopenia diagnosis.  When seen today we repeated his blood count including CBC which showed normal white blood count but the patient has mild anemia with hemoglobin of 10.3 and platelets count of 124,000.  He has no evidence for iron deficiency.  Serum folate and vitamin B12 were normal.  HIV test was nonreactive. 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  He also has mild thrombocytopenia which could be related to his condition. He was treated in the past with Retacrit for few months and 2020 and early 2021 but this was discontinued.  He also  received iron infusion in the past. I recommended for the patient to continue his routine follow-up visit by his primary care physician and nephrology for now.  I do not see a need to do any additional studies for this patient as his anemia and thrombocytopenia are explained by his end-stage renal disease, leaking abdominal aneurysm and medications. We will see the patient on as-needed basis at this point. He can proceed with the abdominal aneurysm repair and if needed the patient may benefit from 1-2 units of PRBCs transfusion before the surgery.  He may also benefit from resuming his treatment with Retacrit by nephrology. The patient was advised to call immediately if he has any other concerning symptoms in the interval.  Disclaimer: This note was dictated with voice recognition software. Similar sounding words can inadvertently be transcribed and may be missed upon review. Eilleen Kempf, MD 12/13/20

## 2020-12-13 ENCOUNTER — Telehealth: Payer: Self-pay | Admitting: *Deleted

## 2020-12-13 ENCOUNTER — Other Ambulatory Visit: Payer: Self-pay | Admitting: Physician Assistant

## 2020-12-13 ENCOUNTER — Encounter: Payer: Self-pay | Admitting: Physician Assistant

## 2020-12-13 ENCOUNTER — Other Ambulatory Visit: Payer: Self-pay

## 2020-12-13 ENCOUNTER — Inpatient Hospital Stay: Payer: Medicare Other | Attending: Physician Assistant | Admitting: Physician Assistant

## 2020-12-13 ENCOUNTER — Inpatient Hospital Stay: Payer: Medicare Other

## 2020-12-13 VITALS — BP 146/94 | HR 96 | Temp 97.7°F | Resp 18 | Ht 72.0 in | Wt 315.3 lb

## 2020-12-13 DIAGNOSIS — F419 Anxiety disorder, unspecified: Secondary | ICD-10-CM | POA: Insufficient documentation

## 2020-12-13 DIAGNOSIS — D61818 Other pancytopenia: Secondary | ICD-10-CM

## 2020-12-13 DIAGNOSIS — Z87891 Personal history of nicotine dependence: Secondary | ICD-10-CM | POA: Insufficient documentation

## 2020-12-13 DIAGNOSIS — I132 Hypertensive heart and chronic kidney disease with heart failure and with stage 5 chronic kidney disease, or end stage renal disease: Secondary | ICD-10-CM | POA: Diagnosis not present

## 2020-12-13 DIAGNOSIS — R531 Weakness: Secondary | ICD-10-CM | POA: Diagnosis not present

## 2020-12-13 DIAGNOSIS — Z885 Allergy status to narcotic agent status: Secondary | ICD-10-CM | POA: Insufficient documentation

## 2020-12-13 DIAGNOSIS — Z79899 Other long term (current) drug therapy: Secondary | ICD-10-CM

## 2020-12-13 DIAGNOSIS — Z992 Dependence on renal dialysis: Secondary | ICD-10-CM

## 2020-12-13 DIAGNOSIS — D696 Thrombocytopenia, unspecified: Secondary | ICD-10-CM | POA: Diagnosis not present

## 2020-12-13 DIAGNOSIS — Z8673 Personal history of transient ischemic attack (TIA), and cerebral infarction without residual deficits: Secondary | ICD-10-CM | POA: Diagnosis not present

## 2020-12-13 DIAGNOSIS — Z7901 Long term (current) use of anticoagulants: Secondary | ICD-10-CM | POA: Diagnosis not present

## 2020-12-13 DIAGNOSIS — R5383 Other fatigue: Secondary | ICD-10-CM | POA: Diagnosis not present

## 2020-12-13 DIAGNOSIS — D509 Iron deficiency anemia, unspecified: Secondary | ICD-10-CM | POA: Diagnosis not present

## 2020-12-13 DIAGNOSIS — Z86711 Personal history of pulmonary embolism: Secondary | ICD-10-CM | POA: Insufficient documentation

## 2020-12-13 DIAGNOSIS — D631 Anemia in chronic kidney disease: Secondary | ICD-10-CM | POA: Diagnosis not present

## 2020-12-13 DIAGNOSIS — N529 Male erectile dysfunction, unspecified: Secondary | ICD-10-CM | POA: Diagnosis not present

## 2020-12-13 DIAGNOSIS — R42 Dizziness and giddiness: Secondary | ICD-10-CM | POA: Diagnosis not present

## 2020-12-13 DIAGNOSIS — I714 Abdominal aortic aneurysm, without rupture: Secondary | ICD-10-CM | POA: Insufficient documentation

## 2020-12-13 DIAGNOSIS — D649 Anemia, unspecified: Secondary | ICD-10-CM | POA: Insufficient documentation

## 2020-12-13 DIAGNOSIS — Z8249 Family history of ischemic heart disease and other diseases of the circulatory system: Secondary | ICD-10-CM

## 2020-12-13 DIAGNOSIS — I509 Heart failure, unspecified: Secondary | ICD-10-CM | POA: Insufficient documentation

## 2020-12-13 DIAGNOSIS — Z86718 Personal history of other venous thrombosis and embolism: Secondary | ICD-10-CM | POA: Diagnosis not present

## 2020-12-13 DIAGNOSIS — M109 Gout, unspecified: Secondary | ICD-10-CM | POA: Insufficient documentation

## 2020-12-13 DIAGNOSIS — R195 Other fecal abnormalities: Secondary | ICD-10-CM | POA: Diagnosis not present

## 2020-12-13 DIAGNOSIS — N186 End stage renal disease: Secondary | ICD-10-CM

## 2020-12-13 DIAGNOSIS — N2581 Secondary hyperparathyroidism of renal origin: Secondary | ICD-10-CM | POA: Diagnosis not present

## 2020-12-13 LAB — CBC WITH DIFFERENTIAL (CANCER CENTER ONLY)
Abs Immature Granulocytes: 0.01 10*3/uL (ref 0.00–0.07)
Basophils Absolute: 0 10*3/uL (ref 0.0–0.1)
Basophils Relative: 1 %
Eosinophils Absolute: 0.1 10*3/uL (ref 0.0–0.5)
Eosinophils Relative: 3 %
HCT: 31.1 % — ABNORMAL LOW (ref 39.0–52.0)
Hemoglobin: 10.3 g/dL — ABNORMAL LOW (ref 13.0–17.0)
Immature Granulocytes: 0 %
Lymphocytes Relative: 23 %
Lymphs Abs: 0.9 10*3/uL (ref 0.7–4.0)
MCH: 28.9 pg (ref 26.0–34.0)
MCHC: 33.1 g/dL (ref 30.0–36.0)
MCV: 87.4 fL (ref 80.0–100.0)
Monocytes Absolute: 0.4 10*3/uL (ref 0.1–1.0)
Monocytes Relative: 11 %
Neutro Abs: 2.6 10*3/uL (ref 1.7–7.7)
Neutrophils Relative %: 62 %
Platelet Count: 124 10*3/uL — ABNORMAL LOW (ref 150–400)
RBC: 3.56 MIL/uL — ABNORMAL LOW (ref 4.22–5.81)
RDW: 13.9 % (ref 11.5–15.5)
WBC Count: 4.1 10*3/uL (ref 4.0–10.5)
nRBC: 0 % (ref 0.0–0.2)

## 2020-12-13 LAB — CMP (CANCER CENTER ONLY)
ALT: 12 U/L (ref 0–44)
AST: 14 U/L — ABNORMAL LOW (ref 15–41)
Albumin: 3.1 g/dL — ABNORMAL LOW (ref 3.5–5.0)
Alkaline Phosphatase: 72 U/L (ref 38–126)
Anion gap: 8 (ref 5–15)
BUN: 13 mg/dL (ref 6–20)
CO2: 31 mmol/L (ref 22–32)
Calcium: 8.5 mg/dL — ABNORMAL LOW (ref 8.9–10.3)
Chloride: 105 mmol/L (ref 98–111)
Creatinine: 4.45 mg/dL (ref 0.61–1.24)
GFR, Estimated: 15 mL/min — ABNORMAL LOW (ref >60)
Glucose, Bld: 106 mg/dL — ABNORMAL HIGH (ref 70–99)
Potassium: 3.8 mmol/L (ref 3.5–5.1)
Sodium: 144 mmol/L (ref 135–145)
Total Bilirubin: 0.6 mg/dL (ref 0.3–1.2)
Total Protein: 6.8 g/dL (ref 6.5–8.1)

## 2020-12-13 LAB — HIV ANTIBODY (ROUTINE TESTING W REFLEX): HIV Screen 4th Generation wRfx: NONREACTIVE

## 2020-12-13 LAB — LACTATE DEHYDROGENASE: LDH: 236 U/L — ABNORMAL HIGH (ref 98–192)

## 2020-12-13 LAB — FERRITIN: Ferritin: 906 ng/mL — ABNORMAL HIGH (ref 24–336)

## 2020-12-13 LAB — HEPATITIS PANEL, ACUTE
HCV Ab: NONREACTIVE
Hep A IgM: NONREACTIVE
Hep B C IgM: NONREACTIVE
Hepatitis B Surface Ag: NONREACTIVE

## 2020-12-13 LAB — IRON AND TIBC
Iron: 125 ug/dL (ref 42–163)
Saturation Ratios: 75 % — ABNORMAL HIGH (ref 20–55)
TIBC: 166 ug/dL — ABNORMAL LOW (ref 202–409)
UIBC: 41 ug/dL — ABNORMAL LOW (ref 117–376)

## 2020-12-13 LAB — FOLATE: Folate: 11.2 ng/mL (ref >5.9)

## 2020-12-13 LAB — VITAMIN B12: Vitamin B-12: 435 pg/mL (ref 180–914)

## 2020-12-13 NOTE — Telephone Encounter (Signed)
Received report from Hiliary/Lab/[facility] of critical creat 4.45.  Reported to [facility] PA who is seeing pt.

## 2020-12-14 ENCOUNTER — Telehealth: Payer: Self-pay | Admitting: Physician Assistant

## 2020-12-14 LAB — ANTINUCLEAR ANTIBODIES, IFA: ANA Ab, IFA: NEGATIVE

## 2020-12-14 LAB — RHEUMATOID FACTOR: Rheumatoid fact SerPl-aCnc: <10 IU/mL (ref <14.0)

## 2020-12-14 NOTE — Telephone Encounter (Signed)
I called the patient to let him know of his lab results. His labs are to be expected. Was calling if he wanted to discuss what they mean in more detail or if he had any questions. If he has any further questions, he was encouraged to call back.

## 2020-12-14 NOTE — Telephone Encounter (Signed)
Made no changes to pt's schedule per 12/20 los

## 2020-12-15 ENCOUNTER — Telehealth: Payer: Self-pay | Admitting: Physician Assistant

## 2020-12-15 DIAGNOSIS — N186 End stage renal disease: Secondary | ICD-10-CM | POA: Diagnosis not present

## 2020-12-15 DIAGNOSIS — Z992 Dependence on renal dialysis: Secondary | ICD-10-CM | POA: Diagnosis not present

## 2020-12-15 DIAGNOSIS — D509 Iron deficiency anemia, unspecified: Secondary | ICD-10-CM | POA: Diagnosis not present

## 2020-12-15 DIAGNOSIS — D631 Anemia in chronic kidney disease: Secondary | ICD-10-CM | POA: Diagnosis not present

## 2020-12-15 DIAGNOSIS — N2581 Secondary hyperparathyroidism of renal origin: Secondary | ICD-10-CM | POA: Diagnosis not present

## 2020-12-15 NOTE — Telephone Encounter (Signed)
I called the patient and reviewed his labs with him. His vitamins, autoimmune workup, and viral panels were negative.

## 2020-12-17 DIAGNOSIS — D631 Anemia in chronic kidney disease: Secondary | ICD-10-CM | POA: Diagnosis not present

## 2020-12-17 DIAGNOSIS — D509 Iron deficiency anemia, unspecified: Secondary | ICD-10-CM | POA: Diagnosis not present

## 2020-12-17 DIAGNOSIS — Z992 Dependence on renal dialysis: Secondary | ICD-10-CM | POA: Diagnosis not present

## 2020-12-17 DIAGNOSIS — N186 End stage renal disease: Secondary | ICD-10-CM | POA: Diagnosis not present

## 2020-12-17 DIAGNOSIS — N2581 Secondary hyperparathyroidism of renal origin: Secondary | ICD-10-CM | POA: Diagnosis not present

## 2020-12-20 DIAGNOSIS — N2581 Secondary hyperparathyroidism of renal origin: Secondary | ICD-10-CM | POA: Diagnosis not present

## 2020-12-20 DIAGNOSIS — D509 Iron deficiency anemia, unspecified: Secondary | ICD-10-CM | POA: Diagnosis not present

## 2020-12-20 DIAGNOSIS — D631 Anemia in chronic kidney disease: Secondary | ICD-10-CM | POA: Diagnosis not present

## 2020-12-20 DIAGNOSIS — N186 End stage renal disease: Secondary | ICD-10-CM | POA: Diagnosis not present

## 2020-12-20 DIAGNOSIS — Z992 Dependence on renal dialysis: Secondary | ICD-10-CM | POA: Diagnosis not present

## 2020-12-22 DIAGNOSIS — Z992 Dependence on renal dialysis: Secondary | ICD-10-CM | POA: Diagnosis not present

## 2020-12-22 DIAGNOSIS — D509 Iron deficiency anemia, unspecified: Secondary | ICD-10-CM | POA: Diagnosis not present

## 2020-12-22 DIAGNOSIS — N186 End stage renal disease: Secondary | ICD-10-CM | POA: Diagnosis not present

## 2020-12-22 DIAGNOSIS — D631 Anemia in chronic kidney disease: Secondary | ICD-10-CM | POA: Diagnosis not present

## 2020-12-22 DIAGNOSIS — N2581 Secondary hyperparathyroidism of renal origin: Secondary | ICD-10-CM | POA: Diagnosis not present

## 2020-12-24 DIAGNOSIS — Z992 Dependence on renal dialysis: Secondary | ICD-10-CM | POA: Diagnosis not present

## 2020-12-24 DIAGNOSIS — D631 Anemia in chronic kidney disease: Secondary | ICD-10-CM | POA: Diagnosis not present

## 2020-12-24 DIAGNOSIS — D509 Iron deficiency anemia, unspecified: Secondary | ICD-10-CM | POA: Diagnosis not present

## 2020-12-24 DIAGNOSIS — N186 End stage renal disease: Secondary | ICD-10-CM | POA: Diagnosis not present

## 2020-12-24 DIAGNOSIS — I129 Hypertensive chronic kidney disease with stage 1 through stage 4 chronic kidney disease, or unspecified chronic kidney disease: Secondary | ICD-10-CM | POA: Diagnosis not present

## 2020-12-24 DIAGNOSIS — N2581 Secondary hyperparathyroidism of renal origin: Secondary | ICD-10-CM | POA: Diagnosis not present

## 2020-12-27 DIAGNOSIS — N2581 Secondary hyperparathyroidism of renal origin: Secondary | ICD-10-CM | POA: Diagnosis not present

## 2020-12-27 DIAGNOSIS — N186 End stage renal disease: Secondary | ICD-10-CM | POA: Diagnosis not present

## 2020-12-27 DIAGNOSIS — D631 Anemia in chronic kidney disease: Secondary | ICD-10-CM | POA: Diagnosis not present

## 2020-12-27 DIAGNOSIS — D509 Iron deficiency anemia, unspecified: Secondary | ICD-10-CM | POA: Diagnosis not present

## 2020-12-27 DIAGNOSIS — Z992 Dependence on renal dialysis: Secondary | ICD-10-CM | POA: Diagnosis not present

## 2020-12-27 DIAGNOSIS — Z23 Encounter for immunization: Secondary | ICD-10-CM | POA: Diagnosis not present

## 2020-12-29 ENCOUNTER — Telehealth: Payer: Self-pay | Admitting: Family Medicine

## 2020-12-29 DIAGNOSIS — Z23 Encounter for immunization: Secondary | ICD-10-CM | POA: Diagnosis not present

## 2020-12-29 DIAGNOSIS — Z992 Dependence on renal dialysis: Secondary | ICD-10-CM | POA: Diagnosis not present

## 2020-12-29 DIAGNOSIS — D631 Anemia in chronic kidney disease: Secondary | ICD-10-CM | POA: Diagnosis not present

## 2020-12-29 DIAGNOSIS — N186 End stage renal disease: Secondary | ICD-10-CM | POA: Diagnosis not present

## 2020-12-29 DIAGNOSIS — D509 Iron deficiency anemia, unspecified: Secondary | ICD-10-CM | POA: Diagnosis not present

## 2020-12-29 DIAGNOSIS — N2581 Secondary hyperparathyroidism of renal origin: Secondary | ICD-10-CM | POA: Diagnosis not present

## 2020-12-29 NOTE — Telephone Encounter (Signed)
Spoke to pt to  schedule Medicare Annual Wellness Visit (AWV) either virtually or in office.  He stated he will call back to schedule surgery coming up   Last AWV ; please schedule at anytime with [facility] Nurse Health Advisor 1 or 2   This should be a 45 minute visit.

## 2020-12-31 DIAGNOSIS — D631 Anemia in chronic kidney disease: Secondary | ICD-10-CM | POA: Diagnosis not present

## 2020-12-31 DIAGNOSIS — Z992 Dependence on renal dialysis: Secondary | ICD-10-CM | POA: Diagnosis not present

## 2020-12-31 DIAGNOSIS — D509 Iron deficiency anemia, unspecified: Secondary | ICD-10-CM | POA: Diagnosis not present

## 2020-12-31 DIAGNOSIS — N2581 Secondary hyperparathyroidism of renal origin: Secondary | ICD-10-CM | POA: Diagnosis not present

## 2020-12-31 DIAGNOSIS — N186 End stage renal disease: Secondary | ICD-10-CM | POA: Diagnosis not present

## 2020-12-31 DIAGNOSIS — Z23 Encounter for immunization: Secondary | ICD-10-CM | POA: Diagnosis not present

## 2021-01-03 DIAGNOSIS — N186 End stage renal disease: Secondary | ICD-10-CM | POA: Diagnosis not present

## 2021-01-03 DIAGNOSIS — D509 Iron deficiency anemia, unspecified: Secondary | ICD-10-CM | POA: Diagnosis not present

## 2021-01-03 DIAGNOSIS — Z23 Encounter for immunization: Secondary | ICD-10-CM | POA: Diagnosis not present

## 2021-01-03 DIAGNOSIS — D631 Anemia in chronic kidney disease: Secondary | ICD-10-CM | POA: Diagnosis not present

## 2021-01-03 DIAGNOSIS — Z992 Dependence on renal dialysis: Secondary | ICD-10-CM | POA: Diagnosis not present

## 2021-01-03 DIAGNOSIS — N2581 Secondary hyperparathyroidism of renal origin: Secondary | ICD-10-CM | POA: Diagnosis not present

## 2021-01-05 DIAGNOSIS — Z23 Encounter for immunization: Secondary | ICD-10-CM | POA: Diagnosis not present

## 2021-01-05 DIAGNOSIS — N2581 Secondary hyperparathyroidism of renal origin: Secondary | ICD-10-CM | POA: Diagnosis not present

## 2021-01-05 DIAGNOSIS — Z992 Dependence on renal dialysis: Secondary | ICD-10-CM | POA: Diagnosis not present

## 2021-01-05 DIAGNOSIS — D509 Iron deficiency anemia, unspecified: Secondary | ICD-10-CM | POA: Diagnosis not present

## 2021-01-05 DIAGNOSIS — D631 Anemia in chronic kidney disease: Secondary | ICD-10-CM | POA: Diagnosis not present

## 2021-01-05 DIAGNOSIS — N186 End stage renal disease: Secondary | ICD-10-CM | POA: Diagnosis not present

## 2021-01-06 DIAGNOSIS — Z992 Dependence on renal dialysis: Secondary | ICD-10-CM | POA: Diagnosis not present

## 2021-01-06 DIAGNOSIS — N2581 Secondary hyperparathyroidism of renal origin: Secondary | ICD-10-CM | POA: Diagnosis not present

## 2021-01-06 DIAGNOSIS — D631 Anemia in chronic kidney disease: Secondary | ICD-10-CM | POA: Diagnosis not present

## 2021-01-06 DIAGNOSIS — Z23 Encounter for immunization: Secondary | ICD-10-CM | POA: Diagnosis not present

## 2021-01-06 DIAGNOSIS — D509 Iron deficiency anemia, unspecified: Secondary | ICD-10-CM | POA: Diagnosis not present

## 2021-01-06 DIAGNOSIS — N186 End stage renal disease: Secondary | ICD-10-CM | POA: Diagnosis not present

## 2021-01-07 DIAGNOSIS — Z79899 Other long term (current) drug therapy: Secondary | ICD-10-CM | POA: Diagnosis not present

## 2021-01-07 DIAGNOSIS — T82330A Leakage of aortic (bifurcation) graft (replacement), initial encounter: Secondary | ICD-10-CM | POA: Diagnosis not present

## 2021-01-07 DIAGNOSIS — I713 Abdominal aortic aneurysm, ruptured: Secondary | ICD-10-CM | POA: Diagnosis not present

## 2021-01-07 DIAGNOSIS — Y831 Surgical operation with implant of artificial internal device as the cause of abnormal reaction of the patient, or of later complication, without mention of misadventure at the time of the procedure: Secondary | ICD-10-CM | POA: Diagnosis not present

## 2021-01-12 DIAGNOSIS — D631 Anemia in chronic kidney disease: Secondary | ICD-10-CM | POA: Diagnosis not present

## 2021-01-12 DIAGNOSIS — Z23 Encounter for immunization: Secondary | ICD-10-CM | POA: Diagnosis not present

## 2021-01-12 DIAGNOSIS — N186 End stage renal disease: Secondary | ICD-10-CM | POA: Diagnosis not present

## 2021-01-12 DIAGNOSIS — D509 Iron deficiency anemia, unspecified: Secondary | ICD-10-CM | POA: Diagnosis not present

## 2021-01-12 DIAGNOSIS — N2581 Secondary hyperparathyroidism of renal origin: Secondary | ICD-10-CM | POA: Diagnosis not present

## 2021-01-12 DIAGNOSIS — Z992 Dependence on renal dialysis: Secondary | ICD-10-CM | POA: Diagnosis not present

## 2021-01-14 DIAGNOSIS — Z992 Dependence on renal dialysis: Secondary | ICD-10-CM | POA: Diagnosis not present

## 2021-01-14 DIAGNOSIS — N2581 Secondary hyperparathyroidism of renal origin: Secondary | ICD-10-CM | POA: Diagnosis not present

## 2021-01-14 DIAGNOSIS — N186 End stage renal disease: Secondary | ICD-10-CM | POA: Diagnosis not present

## 2021-01-14 DIAGNOSIS — D631 Anemia in chronic kidney disease: Secondary | ICD-10-CM | POA: Diagnosis not present

## 2021-01-14 DIAGNOSIS — Z23 Encounter for immunization: Secondary | ICD-10-CM | POA: Diagnosis not present

## 2021-01-14 DIAGNOSIS — D509 Iron deficiency anemia, unspecified: Secondary | ICD-10-CM | POA: Diagnosis not present

## 2021-01-17 DIAGNOSIS — D631 Anemia in chronic kidney disease: Secondary | ICD-10-CM | POA: Diagnosis not present

## 2021-01-17 DIAGNOSIS — D509 Iron deficiency anemia, unspecified: Secondary | ICD-10-CM | POA: Diagnosis not present

## 2021-01-17 DIAGNOSIS — N186 End stage renal disease: Secondary | ICD-10-CM | POA: Diagnosis not present

## 2021-01-17 DIAGNOSIS — N2581 Secondary hyperparathyroidism of renal origin: Secondary | ICD-10-CM | POA: Diagnosis not present

## 2021-01-17 DIAGNOSIS — Z992 Dependence on renal dialysis: Secondary | ICD-10-CM | POA: Diagnosis not present

## 2021-01-17 DIAGNOSIS — Z23 Encounter for immunization: Secondary | ICD-10-CM | POA: Diagnosis not present

## 2021-01-19 DIAGNOSIS — Z992 Dependence on renal dialysis: Secondary | ICD-10-CM | POA: Diagnosis not present

## 2021-01-19 DIAGNOSIS — Z23 Encounter for immunization: Secondary | ICD-10-CM | POA: Diagnosis not present

## 2021-01-19 DIAGNOSIS — N2581 Secondary hyperparathyroidism of renal origin: Secondary | ICD-10-CM | POA: Diagnosis not present

## 2021-01-19 DIAGNOSIS — D509 Iron deficiency anemia, unspecified: Secondary | ICD-10-CM | POA: Diagnosis not present

## 2021-01-19 DIAGNOSIS — D631 Anemia in chronic kidney disease: Secondary | ICD-10-CM | POA: Diagnosis not present

## 2021-01-19 DIAGNOSIS — N186 End stage renal disease: Secondary | ICD-10-CM | POA: Diagnosis not present

## 2021-01-21 DIAGNOSIS — Z992 Dependence on renal dialysis: Secondary | ICD-10-CM | POA: Diagnosis not present

## 2021-01-21 DIAGNOSIS — N186 End stage renal disease: Secondary | ICD-10-CM | POA: Diagnosis not present

## 2021-01-21 DIAGNOSIS — N2581 Secondary hyperparathyroidism of renal origin: Secondary | ICD-10-CM | POA: Diagnosis not present

## 2021-01-21 DIAGNOSIS — D509 Iron deficiency anemia, unspecified: Secondary | ICD-10-CM | POA: Diagnosis not present

## 2021-01-21 DIAGNOSIS — Z23 Encounter for immunization: Secondary | ICD-10-CM | POA: Diagnosis not present

## 2021-01-21 DIAGNOSIS — D631 Anemia in chronic kidney disease: Secondary | ICD-10-CM | POA: Diagnosis not present

## 2021-01-24 DIAGNOSIS — D631 Anemia in chronic kidney disease: Secondary | ICD-10-CM | POA: Diagnosis not present

## 2021-01-24 DIAGNOSIS — Z992 Dependence on renal dialysis: Secondary | ICD-10-CM | POA: Diagnosis not present

## 2021-01-24 DIAGNOSIS — N2581 Secondary hyperparathyroidism of renal origin: Secondary | ICD-10-CM | POA: Diagnosis not present

## 2021-01-24 DIAGNOSIS — N186 End stage renal disease: Secondary | ICD-10-CM | POA: Diagnosis not present

## 2021-01-24 DIAGNOSIS — I129 Hypertensive chronic kidney disease with stage 1 through stage 4 chronic kidney disease, or unspecified chronic kidney disease: Secondary | ICD-10-CM | POA: Diagnosis not present

## 2021-01-24 DIAGNOSIS — Z23 Encounter for immunization: Secondary | ICD-10-CM | POA: Diagnosis not present

## 2021-01-24 DIAGNOSIS — D509 Iron deficiency anemia, unspecified: Secondary | ICD-10-CM | POA: Diagnosis not present

## 2021-01-26 DIAGNOSIS — N186 End stage renal disease: Secondary | ICD-10-CM | POA: Diagnosis not present

## 2021-01-26 DIAGNOSIS — Z23 Encounter for immunization: Secondary | ICD-10-CM | POA: Diagnosis not present

## 2021-01-26 DIAGNOSIS — D631 Anemia in chronic kidney disease: Secondary | ICD-10-CM | POA: Diagnosis not present

## 2021-01-26 DIAGNOSIS — N2581 Secondary hyperparathyroidism of renal origin: Secondary | ICD-10-CM | POA: Diagnosis not present

## 2021-01-26 DIAGNOSIS — D509 Iron deficiency anemia, unspecified: Secondary | ICD-10-CM | POA: Diagnosis not present

## 2021-01-26 DIAGNOSIS — Z992 Dependence on renal dialysis: Secondary | ICD-10-CM | POA: Diagnosis not present

## 2021-01-28 DIAGNOSIS — N2581 Secondary hyperparathyroidism of renal origin: Secondary | ICD-10-CM | POA: Diagnosis not present

## 2021-01-28 DIAGNOSIS — N186 End stage renal disease: Secondary | ICD-10-CM | POA: Diagnosis not present

## 2021-01-28 DIAGNOSIS — Z23 Encounter for immunization: Secondary | ICD-10-CM | POA: Diagnosis not present

## 2021-01-28 DIAGNOSIS — Z992 Dependence on renal dialysis: Secondary | ICD-10-CM | POA: Diagnosis not present

## 2021-01-28 DIAGNOSIS — D509 Iron deficiency anemia, unspecified: Secondary | ICD-10-CM | POA: Diagnosis not present

## 2021-01-28 DIAGNOSIS — D631 Anemia in chronic kidney disease: Secondary | ICD-10-CM | POA: Diagnosis not present

## 2021-01-31 DIAGNOSIS — D631 Anemia in chronic kidney disease: Secondary | ICD-10-CM | POA: Diagnosis not present

## 2021-01-31 DIAGNOSIS — N186 End stage renal disease: Secondary | ICD-10-CM | POA: Diagnosis not present

## 2021-01-31 DIAGNOSIS — N2581 Secondary hyperparathyroidism of renal origin: Secondary | ICD-10-CM | POA: Diagnosis not present

## 2021-01-31 DIAGNOSIS — Z992 Dependence on renal dialysis: Secondary | ICD-10-CM | POA: Diagnosis not present

## 2021-01-31 DIAGNOSIS — D509 Iron deficiency anemia, unspecified: Secondary | ICD-10-CM | POA: Diagnosis not present

## 2021-01-31 DIAGNOSIS — Z23 Encounter for immunization: Secondary | ICD-10-CM | POA: Diagnosis not present

## 2021-02-02 DIAGNOSIS — Z23 Encounter for immunization: Secondary | ICD-10-CM | POA: Diagnosis not present

## 2021-02-02 DIAGNOSIS — N186 End stage renal disease: Secondary | ICD-10-CM | POA: Diagnosis not present

## 2021-02-02 DIAGNOSIS — D509 Iron deficiency anemia, unspecified: Secondary | ICD-10-CM | POA: Diagnosis not present

## 2021-02-02 DIAGNOSIS — N2581 Secondary hyperparathyroidism of renal origin: Secondary | ICD-10-CM | POA: Diagnosis not present

## 2021-02-02 DIAGNOSIS — Z992 Dependence on renal dialysis: Secondary | ICD-10-CM | POA: Diagnosis not present

## 2021-02-02 DIAGNOSIS — D631 Anemia in chronic kidney disease: Secondary | ICD-10-CM | POA: Diagnosis not present

## 2021-02-04 DIAGNOSIS — Z9889 Other specified postprocedural states: Secondary | ICD-10-CM | POA: Diagnosis not present

## 2021-02-04 DIAGNOSIS — N186 End stage renal disease: Secondary | ICD-10-CM | POA: Diagnosis not present

## 2021-02-04 DIAGNOSIS — Z992 Dependence on renal dialysis: Secondary | ICD-10-CM | POA: Diagnosis not present

## 2021-02-04 DIAGNOSIS — D631 Anemia in chronic kidney disease: Secondary | ICD-10-CM | POA: Diagnosis not present

## 2021-02-04 DIAGNOSIS — D509 Iron deficiency anemia, unspecified: Secondary | ICD-10-CM | POA: Diagnosis not present

## 2021-02-04 DIAGNOSIS — Z23 Encounter for immunization: Secondary | ICD-10-CM | POA: Diagnosis not present

## 2021-02-04 DIAGNOSIS — N2581 Secondary hyperparathyroidism of renal origin: Secondary | ICD-10-CM | POA: Diagnosis not present

## 2021-02-04 DIAGNOSIS — I714 Abdominal aortic aneurysm, without rupture: Secondary | ICD-10-CM | POA: Diagnosis not present

## 2021-02-07 DIAGNOSIS — Z992 Dependence on renal dialysis: Secondary | ICD-10-CM | POA: Diagnosis not present

## 2021-02-07 DIAGNOSIS — D631 Anemia in chronic kidney disease: Secondary | ICD-10-CM | POA: Diagnosis not present

## 2021-02-07 DIAGNOSIS — N2581 Secondary hyperparathyroidism of renal origin: Secondary | ICD-10-CM | POA: Diagnosis not present

## 2021-02-07 DIAGNOSIS — D509 Iron deficiency anemia, unspecified: Secondary | ICD-10-CM | POA: Diagnosis not present

## 2021-02-07 DIAGNOSIS — N186 End stage renal disease: Secondary | ICD-10-CM | POA: Diagnosis not present

## 2021-02-07 DIAGNOSIS — Z23 Encounter for immunization: Secondary | ICD-10-CM | POA: Diagnosis not present

## 2021-02-08 ENCOUNTER — Other Ambulatory Visit: Payer: Self-pay | Admitting: Cardiovascular Disease

## 2021-02-08 DIAGNOSIS — Z9889 Other specified postprocedural states: Secondary | ICD-10-CM | POA: Diagnosis not present

## 2021-02-09 DIAGNOSIS — N2581 Secondary hyperparathyroidism of renal origin: Secondary | ICD-10-CM | POA: Diagnosis not present

## 2021-02-09 DIAGNOSIS — D631 Anemia in chronic kidney disease: Secondary | ICD-10-CM | POA: Diagnosis not present

## 2021-02-09 DIAGNOSIS — Z23 Encounter for immunization: Secondary | ICD-10-CM | POA: Diagnosis not present

## 2021-02-09 DIAGNOSIS — Z992 Dependence on renal dialysis: Secondary | ICD-10-CM | POA: Diagnosis not present

## 2021-02-09 DIAGNOSIS — N186 End stage renal disease: Secondary | ICD-10-CM | POA: Diagnosis not present

## 2021-02-09 DIAGNOSIS — D509 Iron deficiency anemia, unspecified: Secondary | ICD-10-CM | POA: Diagnosis not present

## 2021-02-10 NOTE — Telephone Encounter (Signed)
Samples placed up front for pick up.  Eliquis 2.5 mg  2 boxes Lot # [id number] Exp: 1/23

## 2021-02-11 DIAGNOSIS — Z992 Dependence on renal dialysis: Secondary | ICD-10-CM | POA: Diagnosis not present

## 2021-02-11 DIAGNOSIS — Z23 Encounter for immunization: Secondary | ICD-10-CM | POA: Diagnosis not present

## 2021-02-11 DIAGNOSIS — D631 Anemia in chronic kidney disease: Secondary | ICD-10-CM | POA: Diagnosis not present

## 2021-02-11 DIAGNOSIS — D509 Iron deficiency anemia, unspecified: Secondary | ICD-10-CM | POA: Diagnosis not present

## 2021-02-11 DIAGNOSIS — N186 End stage renal disease: Secondary | ICD-10-CM | POA: Diagnosis not present

## 2021-02-11 DIAGNOSIS — N2581 Secondary hyperparathyroidism of renal origin: Secondary | ICD-10-CM | POA: Diagnosis not present

## 2021-02-16 DIAGNOSIS — D631 Anemia in chronic kidney disease: Secondary | ICD-10-CM | POA: Diagnosis not present

## 2021-02-16 DIAGNOSIS — N186 End stage renal disease: Secondary | ICD-10-CM | POA: Diagnosis not present

## 2021-02-16 DIAGNOSIS — N2581 Secondary hyperparathyroidism of renal origin: Secondary | ICD-10-CM | POA: Diagnosis not present

## 2021-02-16 DIAGNOSIS — D509 Iron deficiency anemia, unspecified: Secondary | ICD-10-CM | POA: Diagnosis not present

## 2021-02-16 DIAGNOSIS — Z23 Encounter for immunization: Secondary | ICD-10-CM | POA: Diagnosis not present

## 2021-02-16 DIAGNOSIS — Z992 Dependence on renal dialysis: Secondary | ICD-10-CM | POA: Diagnosis not present

## 2021-02-18 DIAGNOSIS — D631 Anemia in chronic kidney disease: Secondary | ICD-10-CM | POA: Diagnosis not present

## 2021-02-18 DIAGNOSIS — N2581 Secondary hyperparathyroidism of renal origin: Secondary | ICD-10-CM | POA: Diagnosis not present

## 2021-02-18 DIAGNOSIS — Z992 Dependence on renal dialysis: Secondary | ICD-10-CM | POA: Diagnosis not present

## 2021-02-18 DIAGNOSIS — Z23 Encounter for immunization: Secondary | ICD-10-CM | POA: Diagnosis not present

## 2021-02-18 DIAGNOSIS — N186 End stage renal disease: Secondary | ICD-10-CM | POA: Diagnosis not present

## 2021-02-18 DIAGNOSIS — D509 Iron deficiency anemia, unspecified: Secondary | ICD-10-CM | POA: Diagnosis not present

## 2021-02-21 DIAGNOSIS — Z23 Encounter for immunization: Secondary | ICD-10-CM | POA: Diagnosis not present

## 2021-02-21 DIAGNOSIS — D509 Iron deficiency anemia, unspecified: Secondary | ICD-10-CM | POA: Diagnosis not present

## 2021-02-21 DIAGNOSIS — Z992 Dependence on renal dialysis: Secondary | ICD-10-CM | POA: Diagnosis not present

## 2021-02-21 DIAGNOSIS — N186 End stage renal disease: Secondary | ICD-10-CM | POA: Diagnosis not present

## 2021-02-21 DIAGNOSIS — D631 Anemia in chronic kidney disease: Secondary | ICD-10-CM | POA: Diagnosis not present

## 2021-02-21 DIAGNOSIS — N2581 Secondary hyperparathyroidism of renal origin: Secondary | ICD-10-CM | POA: Diagnosis not present

## 2021-02-21 DIAGNOSIS — I129 Hypertensive chronic kidney disease with stage 1 through stage 4 chronic kidney disease, or unspecified chronic kidney disease: Secondary | ICD-10-CM | POA: Diagnosis not present

## 2021-02-23 ENCOUNTER — Encounter: Payer: Self-pay | Admitting: Family Medicine

## 2021-02-23 DIAGNOSIS — Z23 Encounter for immunization: Secondary | ICD-10-CM | POA: Diagnosis not present

## 2021-02-23 DIAGNOSIS — N186 End stage renal disease: Secondary | ICD-10-CM | POA: Diagnosis not present

## 2021-02-23 DIAGNOSIS — Z992 Dependence on renal dialysis: Secondary | ICD-10-CM | POA: Diagnosis not present

## 2021-02-23 DIAGNOSIS — N2581 Secondary hyperparathyroidism of renal origin: Secondary | ICD-10-CM | POA: Diagnosis not present

## 2021-02-23 DIAGNOSIS — D509 Iron deficiency anemia, unspecified: Secondary | ICD-10-CM | POA: Diagnosis not present

## 2021-02-24 NOTE — Telephone Encounter (Signed)
We can do this, but should it be in her name or his?

## 2021-02-25 DIAGNOSIS — Z992 Dependence on renal dialysis: Secondary | ICD-10-CM | POA: Diagnosis not present

## 2021-02-25 DIAGNOSIS — N2581 Secondary hyperparathyroidism of renal origin: Secondary | ICD-10-CM | POA: Diagnosis not present

## 2021-02-25 DIAGNOSIS — N186 End stage renal disease: Secondary | ICD-10-CM | POA: Diagnosis not present

## 2021-02-25 DIAGNOSIS — Z23 Encounter for immunization: Secondary | ICD-10-CM | POA: Diagnosis not present

## 2021-02-25 DIAGNOSIS — D509 Iron deficiency anemia, unspecified: Secondary | ICD-10-CM | POA: Diagnosis not present

## 2021-02-25 NOTE — Telephone Encounter (Signed)
The form is ready  

## 2021-02-25 NOTE — Telephone Encounter (Signed)
Pt notified about the form, states that she will pick up on Monday

## 2021-02-28 DIAGNOSIS — N186 End stage renal disease: Secondary | ICD-10-CM | POA: Diagnosis not present

## 2021-02-28 DIAGNOSIS — D509 Iron deficiency anemia, unspecified: Secondary | ICD-10-CM | POA: Diagnosis not present

## 2021-02-28 DIAGNOSIS — D631 Anemia in chronic kidney disease: Secondary | ICD-10-CM | POA: Diagnosis not present

## 2021-02-28 DIAGNOSIS — Z992 Dependence on renal dialysis: Secondary | ICD-10-CM | POA: Diagnosis not present

## 2021-02-28 DIAGNOSIS — N2581 Secondary hyperparathyroidism of renal origin: Secondary | ICD-10-CM | POA: Diagnosis not present

## 2021-03-01 ENCOUNTER — Telehealth: Payer: Self-pay | Admitting: Cardiovascular Disease

## 2021-03-01 NOTE — Telephone Encounter (Signed)
Patient calling the office for samples of medication:  Pt's wife said she like to talk to [place] about pt's samples   1.  What medication and dosage are you requesting samples for? Eliquis  2.  Are you currently out of this medication? yes

## 2021-03-01 NOTE — Telephone Encounter (Signed)
Spoke to patient's wife she stated husband is almost out of Eliquis 2.5 mg.Stated husband has been denied patient assistance for Eliquis.Advised I will leave 2 boxes of Eliquis 2.5 mg at front desk.Lot # [id number] Exp 03/2022.Stated she would like more samples.Advised I will send message to Dr.Croiroru's RN

## 2021-03-02 DIAGNOSIS — D631 Anemia in chronic kidney disease: Secondary | ICD-10-CM | POA: Diagnosis not present

## 2021-03-02 DIAGNOSIS — N2581 Secondary hyperparathyroidism of renal origin: Secondary | ICD-10-CM | POA: Diagnosis not present

## 2021-03-02 DIAGNOSIS — N186 End stage renal disease: Secondary | ICD-10-CM | POA: Diagnosis not present

## 2021-03-02 DIAGNOSIS — Z992 Dependence on renal dialysis: Secondary | ICD-10-CM | POA: Diagnosis not present

## 2021-03-02 DIAGNOSIS — D509 Iron deficiency anemia, unspecified: Secondary | ICD-10-CM | POA: Diagnosis not present

## 2021-03-04 DIAGNOSIS — N186 End stage renal disease: Secondary | ICD-10-CM | POA: Diagnosis not present

## 2021-03-04 DIAGNOSIS — Z992 Dependence on renal dialysis: Secondary | ICD-10-CM | POA: Diagnosis not present

## 2021-03-04 DIAGNOSIS — N2581 Secondary hyperparathyroidism of renal origin: Secondary | ICD-10-CM | POA: Diagnosis not present

## 2021-03-04 DIAGNOSIS — D631 Anemia in chronic kidney disease: Secondary | ICD-10-CM | POA: Diagnosis not present

## 2021-03-04 DIAGNOSIS — D509 Iron deficiency anemia, unspecified: Secondary | ICD-10-CM | POA: Diagnosis not present

## 2021-03-07 DIAGNOSIS — N2581 Secondary hyperparathyroidism of renal origin: Secondary | ICD-10-CM | POA: Diagnosis not present

## 2021-03-07 DIAGNOSIS — Z992 Dependence on renal dialysis: Secondary | ICD-10-CM | POA: Diagnosis not present

## 2021-03-07 DIAGNOSIS — N186 End stage renal disease: Secondary | ICD-10-CM | POA: Diagnosis not present

## 2021-03-07 DIAGNOSIS — Z86711 Personal history of pulmonary embolism: Secondary | ICD-10-CM | POA: Diagnosis not present

## 2021-03-07 DIAGNOSIS — D631 Anemia in chronic kidney disease: Secondary | ICD-10-CM | POA: Diagnosis not present

## 2021-03-07 DIAGNOSIS — Z7901 Long term (current) use of anticoagulants: Secondary | ICD-10-CM | POA: Diagnosis not present

## 2021-03-07 DIAGNOSIS — Z743 Need for continuous supervision: Secondary | ICD-10-CM | POA: Diagnosis not present

## 2021-03-07 DIAGNOSIS — R58 Hemorrhage, not elsewhere classified: Secondary | ICD-10-CM | POA: Diagnosis not present

## 2021-03-07 DIAGNOSIS — D509 Iron deficiency anemia, unspecified: Secondary | ICD-10-CM | POA: Diagnosis not present

## 2021-03-07 DIAGNOSIS — I120 Hypertensive chronic kidney disease with stage 5 chronic kidney disease or end stage renal disease: Secondary | ICD-10-CM | POA: Diagnosis not present

## 2021-03-07 DIAGNOSIS — N189 Chronic kidney disease, unspecified: Secondary | ICD-10-CM | POA: Diagnosis not present

## 2021-03-07 DIAGNOSIS — I129 Hypertensive chronic kidney disease with stage 1 through stage 4 chronic kidney disease, or unspecified chronic kidney disease: Secondary | ICD-10-CM | POA: Diagnosis not present

## 2021-03-07 DIAGNOSIS — R6889 Other general symptoms and signs: Secondary | ICD-10-CM | POA: Diagnosis not present

## 2021-03-07 DIAGNOSIS — R040 Epistaxis: Secondary | ICD-10-CM | POA: Diagnosis not present

## 2021-03-09 DIAGNOSIS — Z992 Dependence on renal dialysis: Secondary | ICD-10-CM | POA: Diagnosis not present

## 2021-03-09 DIAGNOSIS — D631 Anemia in chronic kidney disease: Secondary | ICD-10-CM | POA: Diagnosis not present

## 2021-03-09 DIAGNOSIS — D509 Iron deficiency anemia, unspecified: Secondary | ICD-10-CM | POA: Diagnosis not present

## 2021-03-09 DIAGNOSIS — N186 End stage renal disease: Secondary | ICD-10-CM | POA: Diagnosis not present

## 2021-03-09 DIAGNOSIS — N2581 Secondary hyperparathyroidism of renal origin: Secondary | ICD-10-CM | POA: Diagnosis not present

## 2021-03-11 DIAGNOSIS — N186 End stage renal disease: Secondary | ICD-10-CM | POA: Diagnosis not present

## 2021-03-11 DIAGNOSIS — Z992 Dependence on renal dialysis: Secondary | ICD-10-CM | POA: Diagnosis not present

## 2021-03-11 DIAGNOSIS — D509 Iron deficiency anemia, unspecified: Secondary | ICD-10-CM | POA: Diagnosis not present

## 2021-03-11 DIAGNOSIS — N2581 Secondary hyperparathyroidism of renal origin: Secondary | ICD-10-CM | POA: Diagnosis not present

## 2021-03-11 DIAGNOSIS — D631 Anemia in chronic kidney disease: Secondary | ICD-10-CM | POA: Diagnosis not present

## 2021-03-14 DIAGNOSIS — Z992 Dependence on renal dialysis: Secondary | ICD-10-CM | POA: Diagnosis not present

## 2021-03-14 DIAGNOSIS — D631 Anemia in chronic kidney disease: Secondary | ICD-10-CM | POA: Diagnosis not present

## 2021-03-14 DIAGNOSIS — N186 End stage renal disease: Secondary | ICD-10-CM | POA: Diagnosis not present

## 2021-03-14 DIAGNOSIS — D509 Iron deficiency anemia, unspecified: Secondary | ICD-10-CM | POA: Diagnosis not present

## 2021-03-14 DIAGNOSIS — N2581 Secondary hyperparathyroidism of renal origin: Secondary | ICD-10-CM | POA: Diagnosis not present

## 2021-03-16 DIAGNOSIS — N186 End stage renal disease: Secondary | ICD-10-CM | POA: Diagnosis not present

## 2021-03-16 DIAGNOSIS — N2581 Secondary hyperparathyroidism of renal origin: Secondary | ICD-10-CM | POA: Diagnosis not present

## 2021-03-16 DIAGNOSIS — D509 Iron deficiency anemia, unspecified: Secondary | ICD-10-CM | POA: Diagnosis not present

## 2021-03-16 DIAGNOSIS — Z992 Dependence on renal dialysis: Secondary | ICD-10-CM | POA: Diagnosis not present

## 2021-03-16 DIAGNOSIS — D631 Anemia in chronic kidney disease: Secondary | ICD-10-CM | POA: Diagnosis not present

## 2021-03-18 DIAGNOSIS — N2581 Secondary hyperparathyroidism of renal origin: Secondary | ICD-10-CM | POA: Diagnosis not present

## 2021-03-18 DIAGNOSIS — D509 Iron deficiency anemia, unspecified: Secondary | ICD-10-CM | POA: Diagnosis not present

## 2021-03-18 DIAGNOSIS — Z992 Dependence on renal dialysis: Secondary | ICD-10-CM | POA: Diagnosis not present

## 2021-03-18 DIAGNOSIS — N186 End stage renal disease: Secondary | ICD-10-CM | POA: Diagnosis not present

## 2021-03-18 DIAGNOSIS — D631 Anemia in chronic kidney disease: Secondary | ICD-10-CM | POA: Diagnosis not present

## 2021-03-21 DIAGNOSIS — N186 End stage renal disease: Secondary | ICD-10-CM | POA: Diagnosis not present

## 2021-03-21 DIAGNOSIS — N2581 Secondary hyperparathyroidism of renal origin: Secondary | ICD-10-CM | POA: Diagnosis not present

## 2021-03-21 DIAGNOSIS — D631 Anemia in chronic kidney disease: Secondary | ICD-10-CM | POA: Diagnosis not present

## 2021-03-21 DIAGNOSIS — Z992 Dependence on renal dialysis: Secondary | ICD-10-CM | POA: Diagnosis not present

## 2021-03-21 DIAGNOSIS — D509 Iron deficiency anemia, unspecified: Secondary | ICD-10-CM | POA: Diagnosis not present

## 2021-03-23 DIAGNOSIS — N186 End stage renal disease: Secondary | ICD-10-CM | POA: Diagnosis not present

## 2021-03-23 DIAGNOSIS — D509 Iron deficiency anemia, unspecified: Secondary | ICD-10-CM | POA: Diagnosis not present

## 2021-03-23 DIAGNOSIS — N2581 Secondary hyperparathyroidism of renal origin: Secondary | ICD-10-CM | POA: Diagnosis not present

## 2021-03-23 DIAGNOSIS — Z992 Dependence on renal dialysis: Secondary | ICD-10-CM | POA: Diagnosis not present

## 2021-03-23 DIAGNOSIS — D631 Anemia in chronic kidney disease: Secondary | ICD-10-CM | POA: Diagnosis not present

## 2021-03-24 ENCOUNTER — Ambulatory Visit: Payer: Medicare Other

## 2021-03-24 DIAGNOSIS — N186 End stage renal disease: Secondary | ICD-10-CM | POA: Diagnosis not present

## 2021-03-24 DIAGNOSIS — Z992 Dependence on renal dialysis: Secondary | ICD-10-CM | POA: Diagnosis not present

## 2021-03-24 DIAGNOSIS — I129 Hypertensive chronic kidney disease with stage 1 through stage 4 chronic kidney disease, or unspecified chronic kidney disease: Secondary | ICD-10-CM | POA: Diagnosis not present

## 2021-03-25 DIAGNOSIS — N186 End stage renal disease: Secondary | ICD-10-CM | POA: Diagnosis not present

## 2021-03-25 DIAGNOSIS — Z992 Dependence on renal dialysis: Secondary | ICD-10-CM | POA: Diagnosis not present

## 2021-03-25 DIAGNOSIS — N2581 Secondary hyperparathyroidism of renal origin: Secondary | ICD-10-CM | POA: Diagnosis not present

## 2021-03-28 DIAGNOSIS — D631 Anemia in chronic kidney disease: Secondary | ICD-10-CM | POA: Diagnosis not present

## 2021-03-28 DIAGNOSIS — N2581 Secondary hyperparathyroidism of renal origin: Secondary | ICD-10-CM | POA: Diagnosis not present

## 2021-03-28 DIAGNOSIS — D509 Iron deficiency anemia, unspecified: Secondary | ICD-10-CM | POA: Diagnosis not present

## 2021-03-28 DIAGNOSIS — N186 End stage renal disease: Secondary | ICD-10-CM | POA: Diagnosis not present

## 2021-03-28 DIAGNOSIS — Z992 Dependence on renal dialysis: Secondary | ICD-10-CM | POA: Diagnosis not present

## 2021-03-30 DIAGNOSIS — D509 Iron deficiency anemia, unspecified: Secondary | ICD-10-CM | POA: Diagnosis not present

## 2021-03-30 DIAGNOSIS — N2581 Secondary hyperparathyroidism of renal origin: Secondary | ICD-10-CM | POA: Diagnosis not present

## 2021-03-30 DIAGNOSIS — Z992 Dependence on renal dialysis: Secondary | ICD-10-CM | POA: Diagnosis not present

## 2021-03-30 DIAGNOSIS — D631 Anemia in chronic kidney disease: Secondary | ICD-10-CM | POA: Diagnosis not present

## 2021-03-30 DIAGNOSIS — N186 End stage renal disease: Secondary | ICD-10-CM | POA: Diagnosis not present

## 2021-04-01 DIAGNOSIS — D631 Anemia in chronic kidney disease: Secondary | ICD-10-CM | POA: Diagnosis not present

## 2021-04-01 DIAGNOSIS — N186 End stage renal disease: Secondary | ICD-10-CM | POA: Diagnosis not present

## 2021-04-01 DIAGNOSIS — Z992 Dependence on renal dialysis: Secondary | ICD-10-CM | POA: Diagnosis not present

## 2021-04-01 DIAGNOSIS — D509 Iron deficiency anemia, unspecified: Secondary | ICD-10-CM | POA: Diagnosis not present

## 2021-04-01 DIAGNOSIS — N2581 Secondary hyperparathyroidism of renal origin: Secondary | ICD-10-CM | POA: Diagnosis not present

## 2021-04-04 DIAGNOSIS — D631 Anemia in chronic kidney disease: Secondary | ICD-10-CM | POA: Diagnosis not present

## 2021-04-04 DIAGNOSIS — N2581 Secondary hyperparathyroidism of renal origin: Secondary | ICD-10-CM | POA: Diagnosis not present

## 2021-04-04 DIAGNOSIS — N186 End stage renal disease: Secondary | ICD-10-CM | POA: Diagnosis not present

## 2021-04-04 DIAGNOSIS — Z992 Dependence on renal dialysis: Secondary | ICD-10-CM | POA: Diagnosis not present

## 2021-04-04 DIAGNOSIS — D509 Iron deficiency anemia, unspecified: Secondary | ICD-10-CM | POA: Diagnosis not present

## 2021-04-05 DIAGNOSIS — I713 Abdominal aortic aneurysm, ruptured: Secondary | ICD-10-CM | POA: Diagnosis not present

## 2021-04-05 DIAGNOSIS — I714 Abdominal aortic aneurysm, without rupture: Secondary | ICD-10-CM | POA: Diagnosis not present

## 2021-04-05 DIAGNOSIS — I9789 Other postprocedural complications and disorders of the circulatory system, not elsewhere classified: Secondary | ICD-10-CM | POA: Diagnosis not present

## 2021-04-05 DIAGNOSIS — T82390A Other mechanical complication of aortic (bifurcation) graft (replacement), initial encounter: Secondary | ICD-10-CM | POA: Diagnosis not present

## 2021-04-05 DIAGNOSIS — Z8679 Personal history of other diseases of the circulatory system: Secondary | ICD-10-CM | POA: Diagnosis not present

## 2021-04-05 DIAGNOSIS — Z9889 Other specified postprocedural states: Secondary | ICD-10-CM | POA: Diagnosis not present

## 2021-04-05 DIAGNOSIS — N186 End stage renal disease: Secondary | ICD-10-CM | POA: Diagnosis not present

## 2021-04-05 DIAGNOSIS — Z7901 Long term (current) use of anticoagulants: Secondary | ICD-10-CM | POA: Diagnosis not present

## 2021-04-06 DIAGNOSIS — N186 End stage renal disease: Secondary | ICD-10-CM | POA: Diagnosis not present

## 2021-04-06 DIAGNOSIS — Z992 Dependence on renal dialysis: Secondary | ICD-10-CM | POA: Diagnosis not present

## 2021-04-06 DIAGNOSIS — N2581 Secondary hyperparathyroidism of renal origin: Secondary | ICD-10-CM | POA: Diagnosis not present

## 2021-04-06 DIAGNOSIS — D631 Anemia in chronic kidney disease: Secondary | ICD-10-CM | POA: Diagnosis not present

## 2021-04-06 DIAGNOSIS — D509 Iron deficiency anemia, unspecified: Secondary | ICD-10-CM | POA: Diagnosis not present

## 2021-04-08 DIAGNOSIS — N186 End stage renal disease: Secondary | ICD-10-CM | POA: Diagnosis not present

## 2021-04-08 DIAGNOSIS — D631 Anemia in chronic kidney disease: Secondary | ICD-10-CM | POA: Diagnosis not present

## 2021-04-08 DIAGNOSIS — D509 Iron deficiency anemia, unspecified: Secondary | ICD-10-CM | POA: Diagnosis not present

## 2021-04-08 DIAGNOSIS — Z992 Dependence on renal dialysis: Secondary | ICD-10-CM | POA: Diagnosis not present

## 2021-04-08 DIAGNOSIS — N2581 Secondary hyperparathyroidism of renal origin: Secondary | ICD-10-CM | POA: Diagnosis not present

## 2021-04-11 DIAGNOSIS — Z992 Dependence on renal dialysis: Secondary | ICD-10-CM | POA: Diagnosis not present

## 2021-04-11 DIAGNOSIS — N186 End stage renal disease: Secondary | ICD-10-CM | POA: Diagnosis not present

## 2021-04-11 DIAGNOSIS — D631 Anemia in chronic kidney disease: Secondary | ICD-10-CM | POA: Diagnosis not present

## 2021-04-11 DIAGNOSIS — D509 Iron deficiency anemia, unspecified: Secondary | ICD-10-CM | POA: Diagnosis not present

## 2021-04-11 DIAGNOSIS — N2581 Secondary hyperparathyroidism of renal origin: Secondary | ICD-10-CM | POA: Diagnosis not present

## 2021-04-11 NOTE — Progress Notes (Signed)
Subjective:   [name] is a 50 y.o. male who presents for an Initial Medicare Annual Wellness Visit.  Virtual Visit via Video Note  I connected with [name]  by a video enabled telemedicine application and verified that I am speaking with the correct person using two identifiers.  Location: Patient: Home Provider: Office Persons participating in the virtual visit: patient, provider   I discussed the limitations of evaluation and management by telemedicine and the availability of in person appointments. The patient expressed understanding and agreed to proceed.     [name],LPN    Review of Systems   n/a       Objective:    There were no vitals filed for this visit. There is no height or weight on file to calculate BMI.  Advanced Directives 12/13/2020 06/18/2020 06/07/2020 01/02/2018 07/23/2017 07/05/2017 12/15/2016  Does Patient Have a Medical Advance Directive? No No No No No No No  Would patient like information on creating a medical advance directive? No - Patient declined - No - Patient declined No - Patient declined - No - Patient declined -  Pre-existing out of facility DNR order (yellow form or pink MOST form) - - - - - - -    Current Medications (verified) Outpatient Encounter Medications as of 04/12/2021  Medication Sig  . acetaminophen (TYLENOL) 500 MG tablet Take 1,000 mg by mouth every 6 (six) hours as needed for moderate pain or headache. Reported on 12/14/2015  . amLODipine (NORVASC) 5 MG tablet Take 1 tablet (5 mg total) by mouth daily.  [name] apixaban (ELIQUIS) 2.5 MG TABS tablet Take 1 tablet (2.5 mg total) by mouth 2 (two) times daily. (Patient taking differently: Take 2.5 mg by mouth See admin instructions. Mon, Wed, Fri, take once daily, all other days taking twice daily)  . atorvastatin (LIPITOR) 10 MG tablet TAKE 1 TABLET BY MOUTH  DAILY  . carvedilol (COREG) 25 MG tablet TAKE 1 TABLET BY MOUTH  TWICE DAILY  . ciprofloxacin  (CIPRO) 500 MG tablet Take 1 tablet (500 mg total) by mouth 2 (two) times daily.  [name] venlafaxine XR (EFFEXOR-XR) 150 MG 24 hr capsule TAKE 1 CAPSULE BY MOUTH EVERY DAY  . zolpidem (AMBIEN) 10 MG tablet Take 1 tablet (10 mg total) by mouth at bedtime as needed. for sleep   No facility-administered encounter medications on file as of 04/12/2021.    Allergies (verified) Oxycodone   History: Past Medical History:  Diagnosis Date  . Abdominal aortic aneurysm ([place])   . Anemia   . Anxiety   . Aortic regurgitation 12/10/08   Echo: mild to mod. AOV regurg,mild AO root dilatation, LA mildly dilated, EF 65%, LV wall thickness markedly increased, small PE  . Chronic kidney disease   . Chronic renal disease    advanced  . Complication of anesthesia    slow to awaken 1 time  . DVT (deep venous thrombosis) ([place])   . H/O aortic dissection 2009   infarenal abdominal aortic dissection  . History of pulmonary embolus (PE) 2010  . Hypertension    dr [name]  . Left ventricular hypertrophy   . Obesity   . Stroke [facility]) 2009   pt says no stroke   Past Surgical History:  Procedure Laterality Date  . ABDOMINAL AORTAGRAM N/A 02/06/2012   Procedure: ABDOMINAL Maxcine Ham;  Surgeon: [name], MD;  Location: [facility] CATH LAB;  Service: Cardiovascular;  Laterality: N/A;  . ABDOMINAL AORTIC ANEURYSM REPAIR  09/25/2012  EVAR  . AORTIC ROOT REPLACEMENT  05/07/2017   s/p valve sparing root replacement with coronary reconstruction along with repair of aortic valve with a 21MM HAART 300 aortic ring annuloplasty at [facility]  . AV FISTULA PLACEMENT Left 05/19/2015   Procedure: ARTERIOVENOUS (AV) FISTULA CREATION-LEFT RADIOCEPHALIC;  Surgeon: Mal [name], MD;  Location: [place];  Service: Vascular;  Laterality: Left;  . BASCILIC VEIN TRANSPOSITION Left 03/11/2020   Procedure: LEFT ARM ARTERIOVENOUS FISTULA  TRANSPOSITION;  Surgeon: [name], MD;  Location: [place];  Service: Vascular;  Laterality: Left;  . CARDIAC  CATHETERIZATION  02/11/10   false + Nuc  . CORNEAL TRANSPLANT Left 1997  . FISTULA SUPERFICIALIZATION Left 08/04/2015   Procedure: FISTULA SUPERFICIALIZATION LEFT RADIAL CEPHALIC;  Surgeon: Mal [name], MD;  Location: [place];  Service: Vascular;  Laterality: Left;  . INTRAVASCULAR ULTRASOUND  09/25/2012   Procedure: INTRAVASCULAR ULTRASOUND;  Surgeon: Mal [name], MD;  Location: [place];  Service: Vascular;  Laterality: N/A;  . IR ANGIOGRAM PELVIS SELECTIVE OR SUPRASELECTIVE  06/18/2020  . IR ANGIOGRAM SELECTIVE EACH ADDITIONAL VESSEL  06/18/2020  . IR AORTAGRAM ABDOMINAL SERIALOGRAM  06/18/2020  . IR RADIOLOGIST EVAL & MGMT  06/08/2020  . IR US GUIDE VASC ACCESS RIGHT  06/18/2020  . LIGATION OF COMPETING BRANCHES OF ARTERIOVENOUS FISTULA Left 08/04/2015   Procedure: LIGATION OF MULTIPLE COMPETING BRANCHES OF ARTERIOVENOUS FISTULA;  Surgeon: Mal [name], MD;  Location: [place];  Service: Vascular;  Laterality: Left;  . RESECTION OF ARTERIOVENOUS FISTULA ANEURYSM Left 01/02/2018   Procedure: RESECTION OF ARTERIOVENOUS FISTULA ANEURYSM LEFT RADIOCEPHALIC;  Surgeon: [name], MD;  Location[facility] OR;  Service: Cardiovascular;  Laterality: Left;   Family History  Problem Relation Age of Onset  . Hypertension Mother   . Hypertension Father    Social History   Socioeconomic History  . Marital status: Married    Spouse name: Not on file  . Number of children: Not on file  . Years of education: Not on file  . Highest education level: Not on file  Occupational History  . Not on file  Tobacco Use  . Smoking status: Former Smoker    Years: 3.00    Types: Cigarettes    Quit date: 01/25/1991    Years since quitting: 30.2  . Smokeless tobacco: Never Used  Vaping Use  . Vaping Use: Never used  Substance and Sexual Activity  . Alcohol use: No    Alcohol/week: 0.0 standard drinks  . Drug use: No  . Sexual activity: Not Currently    Partners: Female  Other Topics Concern  . Not on file   Social History Narrative  . Not on file   Social Determinants of Health   Financial Resource Strain: Not on file  Food Insecurity: Not on file  Transportation Needs: Not on file  Physical Activity: Not on file  Stress: Not on file  Social Connections: Not on file    Tobacco Counseling Counseling given: Not Answered   Clinical Intake:                 Diabetic?no         Activities of Daily Living No flowsheet data found.  Patient Care Team: Laurey Morale, MD as PCP - General (Family Medicine) Sanda Klein, MD as PCP - Cardiology (Cardiology) Edrick Oh, MD (Nephrology) Sanda Klein, MD as Attending Physician (Cardiology) Anson Crofts, MD as Attending Physician (Cardiothoracic Surgery) [facility]  Indicate any recent Medical Services you may have received from other than [facility] providers in the past year (date may be approximate).     Assessment:   This is a routine wellness examination for [place].  Hearing/Vision screen No exam data present  Dietary issues and exercise activities discussed:    Goals   None    Depression Screen PHQ 2/9 Scores 11/07/2017 07/11/2017  PHQ - 2 Score 0 0    Fall Risk Fall Risk  07/05/2017  Falls in the past year? No    FALL RISK PREVENTION PERTAINING TO THE HOME:  Any stairs in or around the home? Yes  If so, are there any without handrails? Yes  Home free of loose throw rugs in walkways, pet beds, electrical cords, etc? Yes  Adequate lighting in your home to reduce risk of falls? Yes   ASSISTIVE DEVICES UTILIZED TO PREVENT FALLS:  Life alert? No  Use of a cane, walker or w/c? No  Grab bars in the bathroom? No  Shower chair or bench in shower? Yes  Elevated toilet seat or a handicapped toilet? No    Cognitive Function:       Normal cognitive status assessed by direct observation by this Nurse Health Advisor. No abnormalities found.    Immunizations Immunization  History  Administered Date(s) Administered  . PFIZER(Purple Top)SARS-COV-2 Vaccination 05/28/2020  . Tdap 01/06/2014    TDAP status: Up to date  Flu Vaccine status: Up to date  Pneumococcal vaccine status: Due, Education has been provided regarding the importance of this vaccine. Advised may receive this vaccine at local pharmacy or Health Dept. Aware to provide a copy of the vaccination record if obtained from local pharmacy or Health Dept. Verbalized acceptance and understanding.  Covid-19 vaccine status: Completed vaccines  Qualifies for Shingles Vaccine? Yes   Zostavax completed No   Shingrix Completed?: No.    Education has been provided regarding the importance of this vaccine. Patient has been advised to call insurance company to determine out of pocket expense if they have not yet received this vaccine. Advised may also receive vaccine at local pharmacy or Health Dept. Verbalized acceptance and understanding.  Screening Tests Health Maintenance  Topic Date Due  . COLONOSCOPY (Pts 45-61yrs Insurance coverage will need to be confirmed)  Never done  . COVID-19 Vaccine (2 - Pfizer 3-dose series) 06/18/2020  . INFLUENZA VACCINE  07/25/2021  . TETANUS/TDAP  01/07/2024  . Hepatitis C Screening  Completed  . HIV Screening  Completed  . HPV VACCINES  Aged Out    Health Maintenance  Health Maintenance Due  Topic Date Due  . COLONOSCOPY (Pts 45-65yrs Insurance coverage will need to be confirmed)  Never done  . COVID-19 Vaccine (2 - Pfizer 3-dose series) 06/18/2020    Colorectal cancer screening: Referral to GI placed 04/12/2021. Pt aware the office will call re: appt.  Lung Cancer Screening: (Low Dose CT Chest recommended if Age 35-80 years, 30 pack-year currently smoking OR have quit w/in 15years.) does not qualify.   Lung Cancer Screening Referral: n/a  Additional Screening:  Hepatitis C Screening: does not qualify  Vision Screening: Recommended annual ophthalmology  exams for early detection of glaucoma and other disorders of the eye. Is the patient up to date with their annual eye exam?  Yes  Who is the provider or what is the name of the office in which the patient attends annual eye exams? Dr.Omen If pt is not established with a provider, would they  like to be referred to a provider to establish care? No .   Dental Screening: Recommended annual dental exams for proper oral hygiene  Community Resource Referral / Chronic Care Management: CRR required this visit?  No   CCM required this visit?  No      Plan:     I have personally reviewed and noted the following in the patient's chart:   . Medical and social history . Use of alcohol, tobacco or illicit drugs  . Current medications and supplements . Functional ability and status . Nutritional status . Physical activity . Advanced directives . List of other physicians . Hospitalizations, surgeries, and ER visits in previous 12 months . Vitals . Screenings to include cognitive, depression, and falls . Referrals and appointments  In addition, I have reviewed and discussed with patient certain preventive protocols, quality metrics, and best practice recommendations. A written personalized care plan for preventive services as well as general preventive health recommendations were provided to patient.     Randel Pigg, LPN   [id number]   Nurse Notes: pt on dialysis experiencing B/P issues with orthostatics advised pt to call Cardiologist to be evaluated, pt agreed.

## 2021-04-12 ENCOUNTER — Ambulatory Visit (INDEPENDENT_AMBULATORY_CARE_PROVIDER_SITE_OTHER): Payer: Medicare Other

## 2021-04-12 DIAGNOSIS — Z79899 Other long term (current) drug therapy: Secondary | ICD-10-CM | POA: Diagnosis not present

## 2021-04-12 DIAGNOSIS — Z0000 Encounter for general adult medical examination without abnormal findings: Secondary | ICD-10-CM | POA: Diagnosis not present

## 2021-04-12 DIAGNOSIS — Z1211 Encounter for screening for malignant neoplasm of colon: Secondary | ICD-10-CM

## 2021-04-12 NOTE — Patient Instructions (Signed)
[name] , Thank you for taking time to come for your Medicare Wellness Visit. I appreciate your ongoing commitment to your health goals. Please review the following plan we discussed and let me know if I can assist you in the future.   Screening recommendations/referrals: Colonoscopy: referral placed 04/12/2021 Recommended yearly ophthalmology/optometry visit for glaucoma screening and checkup Recommended yearly dental visit for hygiene and checkup  Vaccinations: Influenza vaccine: Due in fall 2022 Pneumococcal vaccine: obtain local pharmacy  Tdap vaccine: current 01/07/2024 Shingles vaccine: obtain local pharmacy    Advanced directives: provide copies   Conditions/risks identified: dialysis  patient   Next appointment: none  Preventive Care 1 Years and Older, Male Preventive care refers to lifestyle choices and visits with your health care provider that can promote health and wellness. What does preventive care include?  A yearly physical exam. This is also called an annual well check.  Dental exams once or twice a year.  Routine eye exams. Ask your health care provider how often you should have your eyes checked.  Personal lifestyle choices, including:  Daily care of your teeth and gums.  Regular physical activity.  Eating a healthy diet.  Avoiding tobacco and drug use.  Limiting alcohol use.  Practicing safe sex.  Taking low doses of aspirin every day.  Taking vitamin and mineral supplements as recommended by your health care provider. What happens during an annual well check? The services and screenings done by your health care provider during your annual well check will depend on your age, overall health, lifestyle risk factors, and family history of disease. Counseling  Your health care provider may ask you questions about your:  Alcohol use.  Tobacco use.  Drug use.  Emotional well-being.  Home and relationship well-being.  Sexual  activity.  Eating habits.  History of falls.  Memory and ability to understand (cognition).  Work and work Statistician. Screening  You may have the following tests or measurements:  Height, weight, and BMI.  Blood pressure.  Lipid and cholesterol levels. These may be checked every 5 years, or more frequently if you are over 77 years old.  Skin check.  Lung cancer screening. You may have this screening every year starting at age 56 if you have a 30-pack-year history of smoking and currently smoke or have quit within the past 15 years.  Fecal occult blood test (FOBT) of the stool. You may have this test every year starting at age 29.  Flexible sigmoidoscopy or colonoscopy. You may have a sigmoidoscopy every 5 years or a colonoscopy every 10 years starting at age 6.  Prostate cancer screening. Recommendations will vary depending on your family history and other risks.  Hepatitis C blood test.  Hepatitis B blood test.  Sexually transmitted disease (STD) testing.  Diabetes screening. This is done by checking your blood sugar (glucose) after you have not eaten for a while (fasting). You may have this done every 1-3 years.  Abdominal aortic aneurysm (AAA) screening. You may need this if you are a current or former smoker.  Osteoporosis. You may be screened starting at age 64 if you are at high risk. Talk with your health care provider about your test results, treatment options, and if necessary, the need for more tests. Vaccines  Your health care provider may recommend certain vaccines, such as:  Influenza vaccine. This is recommended every year.  Tetanus, diphtheria, and acellular pertussis (Tdap, Td) vaccine. You may need a Td booster every 10 years.  Zoster  vaccine. You may need this after age 92.  Pneumococcal 13-valent conjugate (PCV13) vaccine. One dose is recommended after age 72.  Pneumococcal polysaccharide (PPSV23) vaccine. One dose is recommended after age  63. Talk to your health care provider about which screenings and vaccines you need and how often you need them. This information is not intended to replace advice given to you by your health care provider. Make sure you discuss any questions you have with your health care provider. Document Released: 01/07/2016 Document Revised: 08/30/2016 Document Reviewed: 10/12/2015 Elsevier Interactive Patient Education  2017 [employer] Prevention in the Home Falls can cause injuries. They can happen to people of all ages. There are many things you can do to make your home safe and to help prevent falls. What can I do on the outside of my home?  Regularly fix the edges of walkways and driveways and fix any cracks.  Remove anything that might make you trip as you walk through a door, such as a raised step or threshold.  Trim any bushes or trees on the path to your home.  Use bright outdoor lighting.  Clear any walking paths of anything that might make someone trip, such as rocks or tools.  Regularly check to see if handrails are loose or broken. Make sure that both sides of any steps have handrails.  Any raised decks and porches should have guardrails on the edges.  Have any leaves, snow, or ice cleared regularly.  Use sand or salt on walking paths during winter.  Clean up any spills in your garage right away. This includes oil or grease spills. What can I do in the bathroom?  Use night lights.  Install grab bars by the toilet and in the tub and shower. Do not use towel bars as grab bars.  Use non-skid mats or decals in the tub or shower.  If you need to sit down in the shower, use a plastic, non-slip stool.  Keep the floor dry. Clean up any water that spills on the floor as soon as it happens.  Remove soap buildup in the tub or shower regularly.  Attach bath mats securely with double-sided non-slip rug tape.  Do not have throw rugs and other things on the floor that can make  you trip. What can I do in the bedroom?  Use night lights.  Make sure that you have a light by your bed that is easy to reach.  Do not use any sheets or blankets that are too big for your bed. They should not hang down onto the floor.  Have a firm chair that has side arms. You can use this for support while you get dressed.  Do not have throw rugs and other things on the floor that can make you trip. What can I do in the kitchen?  Clean up any spills right away.  Avoid walking on wet floors.  Keep items that you use a lot in easy-to-reach places.  If you need to reach something above you, use a strong step stool that has a grab bar.  Keep electrical cords out of the way.  Do not use floor polish or wax that makes floors slippery. If you must use wax, use non-skid floor wax.  Do not have throw rugs and other things on the floor that can make you trip. What can I do with my stairs?  Do not leave any items on the stairs.  Make sure that there are handrails  on both sides of the stairs and use them. Fix handrails that are broken or loose. Make sure that handrails are as long as the stairways.  Check any carpeting to make sure that it is firmly attached to the stairs. Fix any carpet that is loose or worn.  Avoid having throw rugs at the top or bottom of the stairs. If you do have throw rugs, attach them to the floor with carpet tape.  Make sure that you have a light switch at the top of the stairs and the bottom of the stairs. If you do not have them, ask someone to add them for you. What else can I do to help prevent falls?  Wear shoes that:  Do not have high heels.  Have rubber bottoms.  Are comfortable and fit you well.  Are closed at the toe. Do not wear sandals.  If you use a stepladder:  Make sure that it is fully opened. Do not climb a closed stepladder.  Make sure that both sides of the stepladder are locked into place.  Ask someone to hold it for you, if  possible.  Clearly mark and make sure that you can see:  Any grab bars or handrails.  First and last steps.  Where the edge of each step is.  Use tools that help you move around (mobility aids) if they are needed. These include:  Canes.  Walkers.  Scooters.  Crutches.  Turn on the lights when you go into a dark area. Replace any light bulbs as soon as they burn out.  Set up your furniture so you have a clear path. Avoid moving your furniture around.  If any of your floors are uneven, fix them.  If there are any pets around you, be aware of where they are.  Review your medicines with your doctor. Some medicines can make you feel dizzy. This can increase your chance of falling. Ask your doctor what other things that you can do to help prevent falls. This information is not intended to replace advice given to you by your health care provider. Make sure you discuss any questions you have with your health care provider. Document Released: 10/07/2009 Document Revised: 05/18/2016 Document Reviewed: 01/15/2015 Elsevier Interactive Patient Education  2017 [employer].

## 2021-04-13 DIAGNOSIS — N186 End stage renal disease: Secondary | ICD-10-CM | POA: Diagnosis not present

## 2021-04-13 DIAGNOSIS — D631 Anemia in chronic kidney disease: Secondary | ICD-10-CM | POA: Diagnosis not present

## 2021-04-13 DIAGNOSIS — D509 Iron deficiency anemia, unspecified: Secondary | ICD-10-CM | POA: Diagnosis not present

## 2021-04-13 DIAGNOSIS — N2581 Secondary hyperparathyroidism of renal origin: Secondary | ICD-10-CM | POA: Diagnosis not present

## 2021-04-13 DIAGNOSIS — Z992 Dependence on renal dialysis: Secondary | ICD-10-CM | POA: Diagnosis not present

## 2021-04-14 DIAGNOSIS — N186 End stage renal disease: Secondary | ICD-10-CM | POA: Diagnosis not present

## 2021-04-14 DIAGNOSIS — N2581 Secondary hyperparathyroidism of renal origin: Secondary | ICD-10-CM | POA: Diagnosis not present

## 2021-04-14 DIAGNOSIS — Z992 Dependence on renal dialysis: Secondary | ICD-10-CM | POA: Diagnosis not present

## 2021-04-14 DIAGNOSIS — D631 Anemia in chronic kidney disease: Secondary | ICD-10-CM | POA: Diagnosis not present

## 2021-04-14 DIAGNOSIS — D509 Iron deficiency anemia, unspecified: Secondary | ICD-10-CM | POA: Diagnosis not present

## 2021-04-19 DIAGNOSIS — N186 End stage renal disease: Secondary | ICD-10-CM | POA: Diagnosis not present

## 2021-04-19 DIAGNOSIS — D509 Iron deficiency anemia, unspecified: Secondary | ICD-10-CM | POA: Diagnosis not present

## 2021-04-19 DIAGNOSIS — Z992 Dependence on renal dialysis: Secondary | ICD-10-CM | POA: Diagnosis not present

## 2021-04-19 DIAGNOSIS — N2581 Secondary hyperparathyroidism of renal origin: Secondary | ICD-10-CM | POA: Diagnosis not present

## 2021-04-19 DIAGNOSIS — D631 Anemia in chronic kidney disease: Secondary | ICD-10-CM | POA: Diagnosis not present

## 2021-04-20 DIAGNOSIS — N186 End stage renal disease: Secondary | ICD-10-CM | POA: Diagnosis not present

## 2021-04-20 DIAGNOSIS — Z992 Dependence on renal dialysis: Secondary | ICD-10-CM | POA: Diagnosis not present

## 2021-04-20 DIAGNOSIS — D509 Iron deficiency anemia, unspecified: Secondary | ICD-10-CM | POA: Diagnosis not present

## 2021-04-20 DIAGNOSIS — N2581 Secondary hyperparathyroidism of renal origin: Secondary | ICD-10-CM | POA: Diagnosis not present

## 2021-04-20 DIAGNOSIS — D631 Anemia in chronic kidney disease: Secondary | ICD-10-CM | POA: Diagnosis not present

## 2021-04-22 DIAGNOSIS — D631 Anemia in chronic kidney disease: Secondary | ICD-10-CM | POA: Diagnosis not present

## 2021-04-22 DIAGNOSIS — N186 End stage renal disease: Secondary | ICD-10-CM | POA: Diagnosis not present

## 2021-04-22 DIAGNOSIS — Z992 Dependence on renal dialysis: Secondary | ICD-10-CM | POA: Diagnosis not present

## 2021-04-22 DIAGNOSIS — N2581 Secondary hyperparathyroidism of renal origin: Secondary | ICD-10-CM | POA: Diagnosis not present

## 2021-04-22 DIAGNOSIS — D509 Iron deficiency anemia, unspecified: Secondary | ICD-10-CM | POA: Diagnosis not present

## 2021-04-23 DIAGNOSIS — Z992 Dependence on renal dialysis: Secondary | ICD-10-CM | POA: Diagnosis not present

## 2021-04-23 DIAGNOSIS — N186 End stage renal disease: Secondary | ICD-10-CM | POA: Diagnosis not present

## 2021-04-23 DIAGNOSIS — I129 Hypertensive chronic kidney disease with stage 1 through stage 4 chronic kidney disease, or unspecified chronic kidney disease: Secondary | ICD-10-CM | POA: Diagnosis not present

## 2021-04-25 DIAGNOSIS — Z992 Dependence on renal dialysis: Secondary | ICD-10-CM | POA: Diagnosis not present

## 2021-04-25 DIAGNOSIS — D631 Anemia in chronic kidney disease: Secondary | ICD-10-CM | POA: Diagnosis not present

## 2021-04-25 DIAGNOSIS — D509 Iron deficiency anemia, unspecified: Secondary | ICD-10-CM | POA: Diagnosis not present

## 2021-04-25 DIAGNOSIS — N186 End stage renal disease: Secondary | ICD-10-CM | POA: Diagnosis not present

## 2021-04-25 DIAGNOSIS — Z23 Encounter for immunization: Secondary | ICD-10-CM | POA: Diagnosis not present

## 2021-04-25 DIAGNOSIS — N2581 Secondary hyperparathyroidism of renal origin: Secondary | ICD-10-CM | POA: Diagnosis not present

## 2021-04-27 DIAGNOSIS — D631 Anemia in chronic kidney disease: Secondary | ICD-10-CM | POA: Diagnosis not present

## 2021-04-27 DIAGNOSIS — N2581 Secondary hyperparathyroidism of renal origin: Secondary | ICD-10-CM | POA: Diagnosis not present

## 2021-04-27 DIAGNOSIS — N186 End stage renal disease: Secondary | ICD-10-CM | POA: Diagnosis not present

## 2021-04-27 DIAGNOSIS — Z23 Encounter for immunization: Secondary | ICD-10-CM | POA: Diagnosis not present

## 2021-04-27 DIAGNOSIS — D509 Iron deficiency anemia, unspecified: Secondary | ICD-10-CM | POA: Diagnosis not present

## 2021-04-27 DIAGNOSIS — Z992 Dependence on renal dialysis: Secondary | ICD-10-CM | POA: Diagnosis not present

## 2021-04-29 DIAGNOSIS — Z992 Dependence on renal dialysis: Secondary | ICD-10-CM | POA: Diagnosis not present

## 2021-04-29 DIAGNOSIS — D631 Anemia in chronic kidney disease: Secondary | ICD-10-CM | POA: Diagnosis not present

## 2021-04-29 DIAGNOSIS — N186 End stage renal disease: Secondary | ICD-10-CM | POA: Diagnosis not present

## 2021-04-29 DIAGNOSIS — D509 Iron deficiency anemia, unspecified: Secondary | ICD-10-CM | POA: Diagnosis not present

## 2021-04-29 DIAGNOSIS — Z23 Encounter for immunization: Secondary | ICD-10-CM | POA: Diagnosis not present

## 2021-04-29 DIAGNOSIS — N2581 Secondary hyperparathyroidism of renal origin: Secondary | ICD-10-CM | POA: Diagnosis not present

## 2021-05-02 DIAGNOSIS — N2581 Secondary hyperparathyroidism of renal origin: Secondary | ICD-10-CM | POA: Diagnosis not present

## 2021-05-02 DIAGNOSIS — N186 End stage renal disease: Secondary | ICD-10-CM | POA: Diagnosis not present

## 2021-05-02 DIAGNOSIS — D509 Iron deficiency anemia, unspecified: Secondary | ICD-10-CM | POA: Diagnosis not present

## 2021-05-02 DIAGNOSIS — D631 Anemia in chronic kidney disease: Secondary | ICD-10-CM | POA: Diagnosis not present

## 2021-05-02 DIAGNOSIS — Z992 Dependence on renal dialysis: Secondary | ICD-10-CM | POA: Diagnosis not present

## 2021-05-03 ENCOUNTER — Ambulatory Visit (HOSPITAL_COMMUNITY): Payer: Medicare Other

## 2021-05-04 DIAGNOSIS — D509 Iron deficiency anemia, unspecified: Secondary | ICD-10-CM | POA: Diagnosis not present

## 2021-05-04 DIAGNOSIS — N2581 Secondary hyperparathyroidism of renal origin: Secondary | ICD-10-CM | POA: Diagnosis not present

## 2021-05-04 DIAGNOSIS — Z992 Dependence on renal dialysis: Secondary | ICD-10-CM | POA: Diagnosis not present

## 2021-05-04 DIAGNOSIS — N186 End stage renal disease: Secondary | ICD-10-CM | POA: Diagnosis not present

## 2021-05-04 DIAGNOSIS — D631 Anemia in chronic kidney disease: Secondary | ICD-10-CM | POA: Diagnosis not present

## 2021-05-05 ENCOUNTER — Telehealth: Payer: Self-pay | Admitting: Cardiovascular Disease

## 2021-05-05 NOTE — Telephone Encounter (Signed)
He will need to stop the Eliquis for 3 days before the endoleak repair and then restart it as soon as the surgeon tells him it is safe to do so.

## 2021-05-05 NOTE — Telephone Encounter (Signed)
Patient was calling in to get apixaban (ELIQUIS) 2.5 MG TABS tablet samples. Please advise

## 2021-05-05 NOTE — Telephone Encounter (Signed)
Call patient at [phone]

## 2021-05-05 NOTE — Telephone Encounter (Signed)
Returned call to patient, advised patient that 3 Boxes of 2.5mg  Eliquis have been left at the front desk for pick up.  Eliquis 2.5mg  LOT: [id number] EXP: 4/23  Patient also states that he is having an Endoleak repair of his aortic aneurysm coming up and would like to see what Dr. Sallyanne Kuster would like to do about his Eliqius and if he should continue this. Advised patient to have surgical clearance sent over to office, patient states that he has not previously required clearance and has just asked Dr. Sallyanne Kuster.   Advised patient that I would forward message to Dr. Loletha Grayer for him to review and advise. Advised patient to call back to office with any issues, questions, or concerns. Patient verbalized understanding.

## 2021-05-05 NOTE — Telephone Encounter (Signed)
Patient's wife made aware of Dr. Victorino December recommendations and verbalized understanding. Advised to call back with any issues, questions, or concerns.

## 2021-05-06 DIAGNOSIS — N186 End stage renal disease: Secondary | ICD-10-CM | POA: Diagnosis not present

## 2021-05-06 DIAGNOSIS — D509 Iron deficiency anemia, unspecified: Secondary | ICD-10-CM | POA: Diagnosis not present

## 2021-05-06 DIAGNOSIS — N2581 Secondary hyperparathyroidism of renal origin: Secondary | ICD-10-CM | POA: Diagnosis not present

## 2021-05-06 DIAGNOSIS — D631 Anemia in chronic kidney disease: Secondary | ICD-10-CM | POA: Diagnosis not present

## 2021-05-06 DIAGNOSIS — Z992 Dependence on renal dialysis: Secondary | ICD-10-CM | POA: Diagnosis not present

## 2021-05-09 DIAGNOSIS — N186 End stage renal disease: Secondary | ICD-10-CM | POA: Diagnosis not present

## 2021-05-09 DIAGNOSIS — N2581 Secondary hyperparathyroidism of renal origin: Secondary | ICD-10-CM | POA: Diagnosis not present

## 2021-05-09 DIAGNOSIS — D631 Anemia in chronic kidney disease: Secondary | ICD-10-CM | POA: Diagnosis not present

## 2021-05-09 DIAGNOSIS — D509 Iron deficiency anemia, unspecified: Secondary | ICD-10-CM | POA: Diagnosis not present

## 2021-05-09 DIAGNOSIS — Z992 Dependence on renal dialysis: Secondary | ICD-10-CM | POA: Diagnosis not present

## 2021-05-10 ENCOUNTER — Other Ambulatory Visit: Payer: Self-pay

## 2021-05-10 ENCOUNTER — Ambulatory Visit (HOSPITAL_COMMUNITY): Payer: Medicare Other | Attending: Cardiology

## 2021-05-10 DIAGNOSIS — Z8679 Personal history of other diseases of the circulatory system: Secondary | ICD-10-CM | POA: Diagnosis not present

## 2021-05-10 DIAGNOSIS — N186 End stage renal disease: Secondary | ICD-10-CM | POA: Diagnosis not present

## 2021-05-10 DIAGNOSIS — I5032 Chronic diastolic (congestive) heart failure: Secondary | ICD-10-CM

## 2021-05-10 DIAGNOSIS — Z9889 Other specified postprocedural states: Secondary | ICD-10-CM | POA: Diagnosis not present

## 2021-05-10 DIAGNOSIS — N2581 Secondary hyperparathyroidism of renal origin: Secondary | ICD-10-CM | POA: Diagnosis not present

## 2021-05-10 DIAGNOSIS — Z992 Dependence on renal dialysis: Secondary | ICD-10-CM | POA: Diagnosis not present

## 2021-05-10 DIAGNOSIS — D509 Iron deficiency anemia, unspecified: Secondary | ICD-10-CM | POA: Diagnosis not present

## 2021-05-10 DIAGNOSIS — D631 Anemia in chronic kidney disease: Secondary | ICD-10-CM | POA: Diagnosis not present

## 2021-05-10 LAB — ECHOCARDIOGRAM COMPLETE
AV Mean grad: 18 mmHg
AV Peak grad: 33.5 mmHg
Ao pk vel: 2.9 m/s
Area-P 1/2: 3.85 cm2
P 1/2 time: 307 msec
S' Lateral: 2.8 cm

## 2021-05-11 DIAGNOSIS — I9789 Other postprocedural complications and disorders of the circulatory system, not elsewhere classified: Secondary | ICD-10-CM | POA: Diagnosis not present

## 2021-05-12 ENCOUNTER — Encounter: Payer: Self-pay | Admitting: *Deleted

## 2021-05-16 DIAGNOSIS — Z992 Dependence on renal dialysis: Secondary | ICD-10-CM | POA: Diagnosis not present

## 2021-05-16 DIAGNOSIS — D509 Iron deficiency anemia, unspecified: Secondary | ICD-10-CM | POA: Diagnosis not present

## 2021-05-16 DIAGNOSIS — N2581 Secondary hyperparathyroidism of renal origin: Secondary | ICD-10-CM | POA: Diagnosis not present

## 2021-05-16 DIAGNOSIS — D631 Anemia in chronic kidney disease: Secondary | ICD-10-CM | POA: Diagnosis not present

## 2021-05-16 DIAGNOSIS — R52 Pain, unspecified: Secondary | ICD-10-CM | POA: Diagnosis not present

## 2021-05-16 DIAGNOSIS — N186 End stage renal disease: Secondary | ICD-10-CM | POA: Diagnosis not present

## 2021-05-18 DIAGNOSIS — N186 End stage renal disease: Secondary | ICD-10-CM | POA: Diagnosis not present

## 2021-05-18 DIAGNOSIS — R52 Pain, unspecified: Secondary | ICD-10-CM | POA: Diagnosis not present

## 2021-05-18 DIAGNOSIS — N2581 Secondary hyperparathyroidism of renal origin: Secondary | ICD-10-CM | POA: Diagnosis not present

## 2021-05-18 DIAGNOSIS — Z992 Dependence on renal dialysis: Secondary | ICD-10-CM | POA: Diagnosis not present

## 2021-05-18 DIAGNOSIS — D631 Anemia in chronic kidney disease: Secondary | ICD-10-CM | POA: Diagnosis not present

## 2021-05-18 DIAGNOSIS — D509 Iron deficiency anemia, unspecified: Secondary | ICD-10-CM | POA: Diagnosis not present

## 2021-05-20 DIAGNOSIS — R52 Pain, unspecified: Secondary | ICD-10-CM | POA: Diagnosis not present

## 2021-05-20 DIAGNOSIS — D631 Anemia in chronic kidney disease: Secondary | ICD-10-CM | POA: Diagnosis not present

## 2021-05-20 DIAGNOSIS — D509 Iron deficiency anemia, unspecified: Secondary | ICD-10-CM | POA: Diagnosis not present

## 2021-05-20 DIAGNOSIS — Z992 Dependence on renal dialysis: Secondary | ICD-10-CM | POA: Diagnosis not present

## 2021-05-20 DIAGNOSIS — N2581 Secondary hyperparathyroidism of renal origin: Secondary | ICD-10-CM | POA: Diagnosis not present

## 2021-05-20 DIAGNOSIS — N186 End stage renal disease: Secondary | ICD-10-CM | POA: Diagnosis not present

## 2021-05-23 DIAGNOSIS — Z992 Dependence on renal dialysis: Secondary | ICD-10-CM | POA: Diagnosis not present

## 2021-05-23 DIAGNOSIS — N2581 Secondary hyperparathyroidism of renal origin: Secondary | ICD-10-CM | POA: Diagnosis not present

## 2021-05-23 DIAGNOSIS — N186 End stage renal disease: Secondary | ICD-10-CM | POA: Diagnosis not present

## 2021-05-23 DIAGNOSIS — D509 Iron deficiency anemia, unspecified: Secondary | ICD-10-CM | POA: Diagnosis not present

## 2021-05-23 DIAGNOSIS — D631 Anemia in chronic kidney disease: Secondary | ICD-10-CM | POA: Diagnosis not present

## 2021-05-24 DIAGNOSIS — I129 Hypertensive chronic kidney disease with stage 1 through stage 4 chronic kidney disease, or unspecified chronic kidney disease: Secondary | ICD-10-CM | POA: Diagnosis not present

## 2021-05-24 DIAGNOSIS — N186 End stage renal disease: Secondary | ICD-10-CM | POA: Diagnosis not present

## 2021-05-24 DIAGNOSIS — Z992 Dependence on renal dialysis: Secondary | ICD-10-CM | POA: Diagnosis not present

## 2021-05-25 DIAGNOSIS — D509 Iron deficiency anemia, unspecified: Secondary | ICD-10-CM | POA: Diagnosis not present

## 2021-05-25 DIAGNOSIS — Z992 Dependence on renal dialysis: Secondary | ICD-10-CM | POA: Diagnosis not present

## 2021-05-25 DIAGNOSIS — N186 End stage renal disease: Secondary | ICD-10-CM | POA: Diagnosis not present

## 2021-05-25 DIAGNOSIS — N2581 Secondary hyperparathyroidism of renal origin: Secondary | ICD-10-CM | POA: Diagnosis not present

## 2021-05-26 DIAGNOSIS — I9789 Other postprocedural complications and disorders of the circulatory system, not elsewhere classified: Secondary | ICD-10-CM | POA: Diagnosis not present

## 2021-05-30 DIAGNOSIS — N186 End stage renal disease: Secondary | ICD-10-CM | POA: Diagnosis not present

## 2021-05-30 DIAGNOSIS — N2581 Secondary hyperparathyroidism of renal origin: Secondary | ICD-10-CM | POA: Diagnosis not present

## 2021-05-30 DIAGNOSIS — Z992 Dependence on renal dialysis: Secondary | ICD-10-CM | POA: Diagnosis not present

## 2021-06-01 DIAGNOSIS — Z992 Dependence on renal dialysis: Secondary | ICD-10-CM | POA: Diagnosis not present

## 2021-06-01 DIAGNOSIS — N186 End stage renal disease: Secondary | ICD-10-CM | POA: Diagnosis not present

## 2021-06-01 DIAGNOSIS — N2581 Secondary hyperparathyroidism of renal origin: Secondary | ICD-10-CM | POA: Diagnosis not present

## 2021-06-03 DIAGNOSIS — N186 End stage renal disease: Secondary | ICD-10-CM | POA: Diagnosis not present

## 2021-06-03 DIAGNOSIS — N2581 Secondary hyperparathyroidism of renal origin: Secondary | ICD-10-CM | POA: Diagnosis not present

## 2021-06-03 DIAGNOSIS — Z992 Dependence on renal dialysis: Secondary | ICD-10-CM | POA: Diagnosis not present

## 2021-06-06 DIAGNOSIS — N2581 Secondary hyperparathyroidism of renal origin: Secondary | ICD-10-CM | POA: Diagnosis not present

## 2021-06-06 DIAGNOSIS — N186 End stage renal disease: Secondary | ICD-10-CM | POA: Diagnosis not present

## 2021-06-06 DIAGNOSIS — D631 Anemia in chronic kidney disease: Secondary | ICD-10-CM | POA: Diagnosis not present

## 2021-06-06 DIAGNOSIS — Z992 Dependence on renal dialysis: Secondary | ICD-10-CM | POA: Diagnosis not present

## 2021-06-06 MED ORDER — APIXABAN 2.5 MG PO TABS: 2.5000 mg | ORAL_TABLET | ORAL | 0 refills | Status: AC

## 2021-06-06 NOTE — Telephone Encounter (Signed)
Sent mychart message to patient samples available  for pick up  @. 2.5 mg tablets

## 2021-06-08 DIAGNOSIS — N186 End stage renal disease: Secondary | ICD-10-CM | POA: Diagnosis not present

## 2021-06-08 DIAGNOSIS — D631 Anemia in chronic kidney disease: Secondary | ICD-10-CM | POA: Diagnosis not present

## 2021-06-08 DIAGNOSIS — Z992 Dependence on renal dialysis: Secondary | ICD-10-CM | POA: Diagnosis not present

## 2021-06-08 DIAGNOSIS — N2581 Secondary hyperparathyroidism of renal origin: Secondary | ICD-10-CM | POA: Diagnosis not present

## 2021-06-10 DIAGNOSIS — N2581 Secondary hyperparathyroidism of renal origin: Secondary | ICD-10-CM | POA: Diagnosis not present

## 2021-06-10 DIAGNOSIS — D631 Anemia in chronic kidney disease: Secondary | ICD-10-CM | POA: Diagnosis not present

## 2021-06-10 DIAGNOSIS — Z992 Dependence on renal dialysis: Secondary | ICD-10-CM | POA: Diagnosis not present

## 2021-06-10 DIAGNOSIS — N186 End stage renal disease: Secondary | ICD-10-CM | POA: Diagnosis not present

## 2021-06-13 DIAGNOSIS — N2581 Secondary hyperparathyroidism of renal origin: Secondary | ICD-10-CM | POA: Diagnosis not present

## 2021-06-13 DIAGNOSIS — Z992 Dependence on renal dialysis: Secondary | ICD-10-CM | POA: Diagnosis not present

## 2021-06-13 DIAGNOSIS — N186 End stage renal disease: Secondary | ICD-10-CM | POA: Diagnosis not present

## 2021-06-15 DIAGNOSIS — N2581 Secondary hyperparathyroidism of renal origin: Secondary | ICD-10-CM | POA: Diagnosis not present

## 2021-06-15 DIAGNOSIS — N186 End stage renal disease: Secondary | ICD-10-CM | POA: Diagnosis not present

## 2021-06-15 DIAGNOSIS — Z992 Dependence on renal dialysis: Secondary | ICD-10-CM | POA: Diagnosis not present

## 2021-06-17 DIAGNOSIS — Z992 Dependence on renal dialysis: Secondary | ICD-10-CM | POA: Diagnosis not present

## 2021-06-17 DIAGNOSIS — N2581 Secondary hyperparathyroidism of renal origin: Secondary | ICD-10-CM | POA: Diagnosis not present

## 2021-06-17 DIAGNOSIS — N186 End stage renal disease: Secondary | ICD-10-CM | POA: Diagnosis not present

## 2021-06-20 DIAGNOSIS — N2581 Secondary hyperparathyroidism of renal origin: Secondary | ICD-10-CM | POA: Diagnosis not present

## 2021-06-20 DIAGNOSIS — N186 End stage renal disease: Secondary | ICD-10-CM | POA: Diagnosis not present

## 2021-06-20 DIAGNOSIS — D631 Anemia in chronic kidney disease: Secondary | ICD-10-CM | POA: Diagnosis not present

## 2021-06-20 DIAGNOSIS — Z992 Dependence on renal dialysis: Secondary | ICD-10-CM | POA: Diagnosis not present

## 2021-06-22 DIAGNOSIS — N186 End stage renal disease: Secondary | ICD-10-CM | POA: Diagnosis not present

## 2021-06-22 DIAGNOSIS — Z992 Dependence on renal dialysis: Secondary | ICD-10-CM | POA: Diagnosis not present

## 2021-06-22 DIAGNOSIS — D631 Anemia in chronic kidney disease: Secondary | ICD-10-CM | POA: Diagnosis not present

## 2021-06-22 DIAGNOSIS — N2581 Secondary hyperparathyroidism of renal origin: Secondary | ICD-10-CM | POA: Diagnosis not present

## 2021-06-23 DIAGNOSIS — N186 End stage renal disease: Secondary | ICD-10-CM | POA: Diagnosis not present

## 2021-06-23 DIAGNOSIS — Z992 Dependence on renal dialysis: Secondary | ICD-10-CM | POA: Diagnosis not present

## 2021-06-23 DIAGNOSIS — I129 Hypertensive chronic kidney disease with stage 1 through stage 4 chronic kidney disease, or unspecified chronic kidney disease: Secondary | ICD-10-CM | POA: Diagnosis not present

## 2021-06-24 DIAGNOSIS — Z992 Dependence on renal dialysis: Secondary | ICD-10-CM | POA: Diagnosis not present

## 2021-06-24 DIAGNOSIS — D509 Iron deficiency anemia, unspecified: Secondary | ICD-10-CM | POA: Diagnosis not present

## 2021-06-24 DIAGNOSIS — N2581 Secondary hyperparathyroidism of renal origin: Secondary | ICD-10-CM | POA: Diagnosis not present

## 2021-06-24 DIAGNOSIS — N186 End stage renal disease: Secondary | ICD-10-CM | POA: Diagnosis not present

## 2021-06-24 DIAGNOSIS — D631 Anemia in chronic kidney disease: Secondary | ICD-10-CM | POA: Diagnosis not present

## 2021-06-27 DIAGNOSIS — Z992 Dependence on renal dialysis: Secondary | ICD-10-CM | POA: Diagnosis not present

## 2021-06-27 DIAGNOSIS — N2581 Secondary hyperparathyroidism of renal origin: Secondary | ICD-10-CM | POA: Diagnosis not present

## 2021-06-27 DIAGNOSIS — D631 Anemia in chronic kidney disease: Secondary | ICD-10-CM | POA: Diagnosis not present

## 2021-06-27 DIAGNOSIS — N186 End stage renal disease: Secondary | ICD-10-CM | POA: Diagnosis not present

## 2021-06-27 DIAGNOSIS — D509 Iron deficiency anemia, unspecified: Secondary | ICD-10-CM | POA: Diagnosis not present

## 2021-06-29 DIAGNOSIS — N186 End stage renal disease: Secondary | ICD-10-CM | POA: Diagnosis not present

## 2021-06-29 DIAGNOSIS — D631 Anemia in chronic kidney disease: Secondary | ICD-10-CM | POA: Diagnosis not present

## 2021-06-29 DIAGNOSIS — D509 Iron deficiency anemia, unspecified: Secondary | ICD-10-CM | POA: Diagnosis not present

## 2021-06-29 DIAGNOSIS — N2581 Secondary hyperparathyroidism of renal origin: Secondary | ICD-10-CM | POA: Diagnosis not present

## 2021-06-29 DIAGNOSIS — Z992 Dependence on renal dialysis: Secondary | ICD-10-CM | POA: Diagnosis not present

## 2021-07-01 DIAGNOSIS — N2581 Secondary hyperparathyroidism of renal origin: Secondary | ICD-10-CM | POA: Diagnosis not present

## 2021-07-01 DIAGNOSIS — N186 End stage renal disease: Secondary | ICD-10-CM | POA: Diagnosis not present

## 2021-07-01 DIAGNOSIS — Z992 Dependence on renal dialysis: Secondary | ICD-10-CM | POA: Diagnosis not present

## 2021-07-01 DIAGNOSIS — D509 Iron deficiency anemia, unspecified: Secondary | ICD-10-CM | POA: Diagnosis not present

## 2021-07-01 DIAGNOSIS — D631 Anemia in chronic kidney disease: Secondary | ICD-10-CM | POA: Diagnosis not present

## 2021-07-04 ENCOUNTER — Encounter: Payer: Self-pay | Admitting: Family Medicine

## 2021-07-04 DIAGNOSIS — N186 End stage renal disease: Secondary | ICD-10-CM | POA: Diagnosis not present

## 2021-07-04 DIAGNOSIS — Z992 Dependence on renal dialysis: Secondary | ICD-10-CM | POA: Diagnosis not present

## 2021-07-04 DIAGNOSIS — N2581 Secondary hyperparathyroidism of renal origin: Secondary | ICD-10-CM | POA: Diagnosis not present

## 2021-07-04 DIAGNOSIS — D631 Anemia in chronic kidney disease: Secondary | ICD-10-CM | POA: Diagnosis not present

## 2021-07-04 DIAGNOSIS — D509 Iron deficiency anemia, unspecified: Secondary | ICD-10-CM | POA: Diagnosis not present

## 2021-07-04 MED ORDER — ZOLPIDEM TARTRATE 10 MG PO TABS: 10.0000 mg | ORAL_TABLET | Freq: Every evening | ORAL | 1 refills | Status: AC | PRN

## 2021-07-04 NOTE — Telephone Encounter (Signed)
Done

## 2021-07-06 DIAGNOSIS — N2581 Secondary hyperparathyroidism of renal origin: Secondary | ICD-10-CM | POA: Diagnosis not present

## 2021-07-06 DIAGNOSIS — D631 Anemia in chronic kidney disease: Secondary | ICD-10-CM | POA: Diagnosis not present

## 2021-07-06 DIAGNOSIS — D509 Iron deficiency anemia, unspecified: Secondary | ICD-10-CM | POA: Diagnosis not present

## 2021-07-06 DIAGNOSIS — Z992 Dependence on renal dialysis: Secondary | ICD-10-CM | POA: Diagnosis not present

## 2021-07-06 DIAGNOSIS — N186 End stage renal disease: Secondary | ICD-10-CM | POA: Diagnosis not present

## 2021-07-08 DIAGNOSIS — D631 Anemia in chronic kidney disease: Secondary | ICD-10-CM | POA: Diagnosis not present

## 2021-07-08 DIAGNOSIS — D509 Iron deficiency anemia, unspecified: Secondary | ICD-10-CM | POA: Diagnosis not present

## 2021-07-08 DIAGNOSIS — Z992 Dependence on renal dialysis: Secondary | ICD-10-CM | POA: Diagnosis not present

## 2021-07-08 DIAGNOSIS — N186 End stage renal disease: Secondary | ICD-10-CM | POA: Diagnosis not present

## 2021-07-08 DIAGNOSIS — N2581 Secondary hyperparathyroidism of renal origin: Secondary | ICD-10-CM | POA: Diagnosis not present

## 2021-07-11 DIAGNOSIS — N186 End stage renal disease: Secondary | ICD-10-CM | POA: Diagnosis not present

## 2021-07-11 DIAGNOSIS — Z992 Dependence on renal dialysis: Secondary | ICD-10-CM | POA: Diagnosis not present

## 2021-07-11 DIAGNOSIS — D631 Anemia in chronic kidney disease: Secondary | ICD-10-CM | POA: Diagnosis not present

## 2021-07-11 DIAGNOSIS — D509 Iron deficiency anemia, unspecified: Secondary | ICD-10-CM | POA: Diagnosis not present

## 2021-07-11 DIAGNOSIS — N2581 Secondary hyperparathyroidism of renal origin: Secondary | ICD-10-CM | POA: Diagnosis not present

## 2021-07-13 DIAGNOSIS — N186 End stage renal disease: Secondary | ICD-10-CM | POA: Diagnosis not present

## 2021-07-13 DIAGNOSIS — D509 Iron deficiency anemia, unspecified: Secondary | ICD-10-CM | POA: Diagnosis not present

## 2021-07-13 DIAGNOSIS — D631 Anemia in chronic kidney disease: Secondary | ICD-10-CM | POA: Diagnosis not present

## 2021-07-13 DIAGNOSIS — Z992 Dependence on renal dialysis: Secondary | ICD-10-CM | POA: Diagnosis not present

## 2021-07-13 DIAGNOSIS — N2581 Secondary hyperparathyroidism of renal origin: Secondary | ICD-10-CM | POA: Diagnosis not present

## 2021-07-15 DIAGNOSIS — N186 End stage renal disease: Secondary | ICD-10-CM | POA: Diagnosis not present

## 2021-07-15 DIAGNOSIS — Z992 Dependence on renal dialysis: Secondary | ICD-10-CM | POA: Diagnosis not present

## 2021-07-15 DIAGNOSIS — D509 Iron deficiency anemia, unspecified: Secondary | ICD-10-CM | POA: Diagnosis not present

## 2021-07-15 DIAGNOSIS — N2581 Secondary hyperparathyroidism of renal origin: Secondary | ICD-10-CM | POA: Diagnosis not present

## 2021-07-15 DIAGNOSIS — D631 Anemia in chronic kidney disease: Secondary | ICD-10-CM | POA: Diagnosis not present

## 2021-07-18 DIAGNOSIS — D631 Anemia in chronic kidney disease: Secondary | ICD-10-CM | POA: Diagnosis not present

## 2021-07-18 DIAGNOSIS — D509 Iron deficiency anemia, unspecified: Secondary | ICD-10-CM | POA: Diagnosis not present

## 2021-07-18 DIAGNOSIS — Z992 Dependence on renal dialysis: Secondary | ICD-10-CM | POA: Diagnosis not present

## 2021-07-18 DIAGNOSIS — N186 End stage renal disease: Secondary | ICD-10-CM | POA: Diagnosis not present

## 2021-07-18 DIAGNOSIS — N2581 Secondary hyperparathyroidism of renal origin: Secondary | ICD-10-CM | POA: Diagnosis not present

## 2021-07-19 DIAGNOSIS — Z20822 Contact with and (suspected) exposure to covid-19: Secondary | ICD-10-CM | POA: Diagnosis not present

## 2021-07-20 ENCOUNTER — Encounter: Payer: Medicare Other | Admitting: Family Medicine

## 2021-07-20 DIAGNOSIS — D509 Iron deficiency anemia, unspecified: Secondary | ICD-10-CM | POA: Diagnosis not present

## 2021-07-20 DIAGNOSIS — N2581 Secondary hyperparathyroidism of renal origin: Secondary | ICD-10-CM | POA: Diagnosis not present

## 2021-07-20 DIAGNOSIS — N186 End stage renal disease: Secondary | ICD-10-CM | POA: Diagnosis not present

## 2021-07-20 DIAGNOSIS — Z992 Dependence on renal dialysis: Secondary | ICD-10-CM | POA: Diagnosis not present

## 2021-07-20 DIAGNOSIS — D631 Anemia in chronic kidney disease: Secondary | ICD-10-CM | POA: Diagnosis not present

## 2021-07-22 DIAGNOSIS — N2581 Secondary hyperparathyroidism of renal origin: Secondary | ICD-10-CM | POA: Diagnosis not present

## 2021-07-22 DIAGNOSIS — D631 Anemia in chronic kidney disease: Secondary | ICD-10-CM | POA: Diagnosis not present

## 2021-07-22 DIAGNOSIS — D509 Iron deficiency anemia, unspecified: Secondary | ICD-10-CM | POA: Diagnosis not present

## 2021-07-22 DIAGNOSIS — Z992 Dependence on renal dialysis: Secondary | ICD-10-CM | POA: Diagnosis not present

## 2021-07-22 DIAGNOSIS — N186 End stage renal disease: Secondary | ICD-10-CM | POA: Diagnosis not present

## 2021-07-23 ENCOUNTER — Encounter: Payer: Self-pay | Admitting: Family Medicine

## 2021-07-24 DIAGNOSIS — N186 End stage renal disease: Secondary | ICD-10-CM | POA: Diagnosis not present

## 2021-07-24 DIAGNOSIS — Z992 Dependence on renal dialysis: Secondary | ICD-10-CM | POA: Diagnosis not present

## 2021-07-24 DIAGNOSIS — I129 Hypertensive chronic kidney disease with stage 1 through stage 4 chronic kidney disease, or unspecified chronic kidney disease: Secondary | ICD-10-CM | POA: Diagnosis not present

## 2021-07-25 DIAGNOSIS — N2581 Secondary hyperparathyroidism of renal origin: Secondary | ICD-10-CM | POA: Diagnosis not present

## 2021-07-25 DIAGNOSIS — D509 Iron deficiency anemia, unspecified: Secondary | ICD-10-CM | POA: Diagnosis not present

## 2021-07-25 DIAGNOSIS — Z992 Dependence on renal dialysis: Secondary | ICD-10-CM | POA: Diagnosis not present

## 2021-07-25 DIAGNOSIS — N186 End stage renal disease: Secondary | ICD-10-CM | POA: Diagnosis not present

## 2021-07-25 DIAGNOSIS — D631 Anemia in chronic kidney disease: Secondary | ICD-10-CM | POA: Diagnosis not present

## 2021-07-25 NOTE — Telephone Encounter (Signed)
Yes he should contact Cardiology

## 2021-07-26 ENCOUNTER — Telehealth: Payer: Self-pay | Admitting: *Deleted

## 2021-07-26 DIAGNOSIS — U071 COVID-19: Secondary | ICD-10-CM

## 2021-07-26 NOTE — Telephone Encounter (Signed)
Medication Samples have been provided to the patient.  Drug name: Eliquis       Strength: 2.5 mg        Qty: 4 boxes  LOT: [id number]  Exp.Date: 05/2022

## 2021-07-27 DIAGNOSIS — Z992 Dependence on renal dialysis: Secondary | ICD-10-CM | POA: Diagnosis not present

## 2021-07-27 DIAGNOSIS — D631 Anemia in chronic kidney disease: Secondary | ICD-10-CM | POA: Diagnosis not present

## 2021-07-27 DIAGNOSIS — N2581 Secondary hyperparathyroidism of renal origin: Secondary | ICD-10-CM | POA: Diagnosis not present

## 2021-07-27 DIAGNOSIS — N186 End stage renal disease: Secondary | ICD-10-CM | POA: Diagnosis not present

## 2021-07-27 DIAGNOSIS — D509 Iron deficiency anemia, unspecified: Secondary | ICD-10-CM | POA: Diagnosis not present

## 2021-07-29 DIAGNOSIS — D509 Iron deficiency anemia, unspecified: Secondary | ICD-10-CM | POA: Diagnosis not present

## 2021-07-29 DIAGNOSIS — D631 Anemia in chronic kidney disease: Secondary | ICD-10-CM | POA: Diagnosis not present

## 2021-07-29 DIAGNOSIS — N2581 Secondary hyperparathyroidism of renal origin: Secondary | ICD-10-CM | POA: Diagnosis not present

## 2021-07-29 DIAGNOSIS — N186 End stage renal disease: Secondary | ICD-10-CM | POA: Diagnosis not present

## 2021-07-29 DIAGNOSIS — Z992 Dependence on renal dialysis: Secondary | ICD-10-CM | POA: Diagnosis not present

## 2021-08-01 ENCOUNTER — Other Ambulatory Visit: Payer: Self-pay

## 2021-08-01 DIAGNOSIS — D631 Anemia in chronic kidney disease: Secondary | ICD-10-CM | POA: Diagnosis not present

## 2021-08-01 DIAGNOSIS — N186 End stage renal disease: Secondary | ICD-10-CM | POA: Diagnosis not present

## 2021-08-01 DIAGNOSIS — D509 Iron deficiency anemia, unspecified: Secondary | ICD-10-CM | POA: Diagnosis not present

## 2021-08-01 DIAGNOSIS — N2581 Secondary hyperparathyroidism of renal origin: Secondary | ICD-10-CM | POA: Diagnosis not present

## 2021-08-01 DIAGNOSIS — Z992 Dependence on renal dialysis: Secondary | ICD-10-CM | POA: Diagnosis not present

## 2021-08-02 ENCOUNTER — Encounter: Payer: Medicare Other | Admitting: Family Medicine

## 2021-08-02 DIAGNOSIS — M549 Dorsalgia, unspecified: Secondary | ICD-10-CM | POA: Diagnosis not present

## 2021-08-02 DIAGNOSIS — I517 Cardiomegaly: Secondary | ICD-10-CM | POA: Diagnosis not present

## 2021-08-02 DIAGNOSIS — S199XXA Unspecified injury of neck, initial encounter: Secondary | ICD-10-CM | POA: Diagnosis not present

## 2021-08-02 DIAGNOSIS — I700 Atherosclerosis of aorta: Secondary | ICD-10-CM | POA: Diagnosis not present

## 2021-08-02 DIAGNOSIS — D649 Anemia, unspecified: Secondary | ICD-10-CM | POA: Diagnosis not present

## 2021-08-02 DIAGNOSIS — T82310A Breakdown (mechanical) of aortic (bifurcation) graft (replacement), initial encounter: Secondary | ICD-10-CM | POA: Diagnosis not present

## 2021-08-02 DIAGNOSIS — K8020 Calculus of gallbladder without cholecystitis without obstruction: Secondary | ICD-10-CM | POA: Diagnosis not present

## 2021-08-02 DIAGNOSIS — I1311 Hypertensive heart and chronic kidney disease without heart failure, with stage 5 chronic kidney disease, or end stage renal disease: Secondary | ICD-10-CM | POA: Diagnosis not present

## 2021-08-02 DIAGNOSIS — I454 Nonspecific intraventricular block: Secondary | ICD-10-CM | POA: Diagnosis not present

## 2021-08-02 DIAGNOSIS — I120 Hypertensive chronic kidney disease with stage 5 chronic kidney disease or end stage renal disease: Secondary | ICD-10-CM | POA: Diagnosis not present

## 2021-08-02 DIAGNOSIS — Z1159 Encounter for screening for other viral diseases: Secondary | ICD-10-CM | POA: Diagnosis not present

## 2021-08-02 DIAGNOSIS — N289 Disorder of kidney and ureter, unspecified: Secondary | ICD-10-CM | POA: Diagnosis not present

## 2021-08-02 DIAGNOSIS — N186 End stage renal disease: Secondary | ICD-10-CM | POA: Diagnosis not present

## 2021-08-02 DIAGNOSIS — I713 Abdominal aortic aneurysm, ruptured: Secondary | ICD-10-CM | POA: Diagnosis not present

## 2021-08-02 DIAGNOSIS — R55 Syncope and collapse: Secondary | ICD-10-CM | POA: Diagnosis not present

## 2021-08-02 DIAGNOSIS — Z992 Dependence on renal dialysis: Secondary | ICD-10-CM | POA: Diagnosis not present

## 2021-08-02 DIAGNOSIS — D631 Anemia in chronic kidney disease: Secondary | ICD-10-CM | POA: Diagnosis not present

## 2021-08-02 DIAGNOSIS — M4602 Spinal enthesopathy, cervical region: Secondary | ICD-10-CM | POA: Diagnosis not present

## 2021-08-02 DIAGNOSIS — I9789 Other postprocedural complications and disorders of the circulatory system, not elsewhere classified: Secondary | ICD-10-CM | POA: Diagnosis not present

## 2021-08-02 DIAGNOSIS — R410 Disorientation, unspecified: Secondary | ICD-10-CM | POA: Diagnosis not present

## 2021-08-02 DIAGNOSIS — I714 Abdominal aortic aneurysm, without rupture: Secondary | ICD-10-CM | POA: Diagnosis not present

## 2021-08-02 DIAGNOSIS — R109 Unspecified abdominal pain: Secondary | ICD-10-CM | POA: Diagnosis not present

## 2021-08-02 DIAGNOSIS — M5030 Other cervical disc degeneration, unspecified cervical region: Secondary | ICD-10-CM | POA: Diagnosis not present

## 2021-08-02 DIAGNOSIS — R778 Other specified abnormalities of plasma proteins: Secondary | ICD-10-CM | POA: Diagnosis not present

## 2021-08-02 DIAGNOSIS — R000 Tachycardia, unspecified: Secondary | ICD-10-CM | POA: Diagnosis not present

## 2021-08-02 DIAGNOSIS — R42 Dizziness and giddiness: Secondary | ICD-10-CM | POA: Diagnosis not present

## 2021-08-02 DIAGNOSIS — Z20822 Contact with and (suspected) exposure to covid-19: Secondary | ICD-10-CM | POA: Diagnosis not present

## 2021-08-02 DIAGNOSIS — R7989 Other specified abnormal findings of blood chemistry: Secondary | ICD-10-CM | POA: Diagnosis not present

## 2021-08-03 ENCOUNTER — Encounter: Payer: Medicare Other | Admitting: Family Medicine

## 2021-08-03 DIAGNOSIS — I120 Hypertensive chronic kidney disease with stage 5 chronic kidney disease or end stage renal disease: Secondary | ICD-10-CM | POA: Diagnosis not present

## 2021-08-03 DIAGNOSIS — I714 Abdominal aortic aneurysm, without rupture: Secondary | ICD-10-CM | POA: Diagnosis not present

## 2021-08-03 DIAGNOSIS — Z992 Dependence on renal dialysis: Secondary | ICD-10-CM | POA: Diagnosis not present

## 2021-08-03 DIAGNOSIS — D631 Anemia in chronic kidney disease: Secondary | ICD-10-CM | POA: Diagnosis not present

## 2021-08-03 DIAGNOSIS — Z8679 Personal history of other diseases of the circulatory system: Secondary | ICD-10-CM | POA: Diagnosis not present

## 2021-08-03 DIAGNOSIS — N186 End stage renal disease: Secondary | ICD-10-CM | POA: Diagnosis not present

## 2021-08-03 DIAGNOSIS — I454 Nonspecific intraventricular block: Secondary | ICD-10-CM | POA: Diagnosis not present

## 2021-08-04 DIAGNOSIS — T82310A Breakdown (mechanical) of aortic (bifurcation) graft (replacement), initial encounter: Secondary | ICD-10-CM | POA: Diagnosis not present

## 2021-08-04 DIAGNOSIS — Z86718 Personal history of other venous thrombosis and embolism: Secondary | ICD-10-CM | POA: Diagnosis not present

## 2021-08-04 DIAGNOSIS — Z87891 Personal history of nicotine dependence: Secondary | ICD-10-CM | POA: Diagnosis not present

## 2021-08-04 DIAGNOSIS — Z86711 Personal history of pulmonary embolism: Secondary | ICD-10-CM | POA: Diagnosis not present

## 2021-08-04 DIAGNOSIS — I714 Abdominal aortic aneurysm, without rupture: Secondary | ICD-10-CM | POA: Diagnosis not present

## 2021-08-04 DIAGNOSIS — N186 End stage renal disease: Secondary | ICD-10-CM | POA: Diagnosis not present

## 2021-08-04 DIAGNOSIS — N250 Renal osteodystrophy: Secondary | ICD-10-CM | POA: Diagnosis not present

## 2021-08-04 DIAGNOSIS — Z947 Corneal transplant status: Secondary | ICD-10-CM | POA: Diagnosis not present

## 2021-08-04 DIAGNOSIS — Z7901 Long term (current) use of anticoagulants: Secondary | ICD-10-CM | POA: Diagnosis not present

## 2021-08-04 DIAGNOSIS — Z8679 Personal history of other diseases of the circulatory system: Secondary | ICD-10-CM | POA: Diagnosis not present

## 2021-08-04 DIAGNOSIS — Z992 Dependence on renal dialysis: Secondary | ICD-10-CM | POA: Diagnosis not present

## 2021-08-04 DIAGNOSIS — D649 Anemia, unspecified: Secondary | ICD-10-CM | POA: Diagnosis not present

## 2021-08-04 DIAGNOSIS — Z7982 Long term (current) use of aspirin: Secondary | ICD-10-CM | POA: Diagnosis not present

## 2021-08-04 DIAGNOSIS — E785 Hyperlipidemia, unspecified: Secondary | ICD-10-CM | POA: Diagnosis not present

## 2021-08-04 DIAGNOSIS — I120 Hypertensive chronic kidney disease with stage 5 chronic kidney disease or end stage renal disease: Secondary | ICD-10-CM | POA: Diagnosis not present

## 2021-08-04 DIAGNOSIS — I9789 Other postprocedural complications and disorders of the circulatory system, not elsewhere classified: Secondary | ICD-10-CM | POA: Diagnosis not present

## 2021-08-08 DIAGNOSIS — N186 End stage renal disease: Secondary | ICD-10-CM | POA: Diagnosis not present

## 2021-08-08 DIAGNOSIS — N2581 Secondary hyperparathyroidism of renal origin: Secondary | ICD-10-CM | POA: Diagnosis not present

## 2021-08-08 DIAGNOSIS — Z992 Dependence on renal dialysis: Secondary | ICD-10-CM | POA: Diagnosis not present

## 2021-08-08 DIAGNOSIS — D509 Iron deficiency anemia, unspecified: Secondary | ICD-10-CM | POA: Diagnosis not present

## 2021-08-08 DIAGNOSIS — D631 Anemia in chronic kidney disease: Secondary | ICD-10-CM | POA: Diagnosis not present

## 2021-08-10 DIAGNOSIS — D631 Anemia in chronic kidney disease: Secondary | ICD-10-CM | POA: Diagnosis not present

## 2021-08-10 DIAGNOSIS — N186 End stage renal disease: Secondary | ICD-10-CM | POA: Diagnosis not present

## 2021-08-10 DIAGNOSIS — D509 Iron deficiency anemia, unspecified: Secondary | ICD-10-CM | POA: Diagnosis not present

## 2021-08-10 DIAGNOSIS — Z992 Dependence on renal dialysis: Secondary | ICD-10-CM | POA: Diagnosis not present

## 2021-08-10 DIAGNOSIS — N2581 Secondary hyperparathyroidism of renal origin: Secondary | ICD-10-CM | POA: Diagnosis not present

## 2021-08-12 DIAGNOSIS — D631 Anemia in chronic kidney disease: Secondary | ICD-10-CM | POA: Diagnosis not present

## 2021-08-12 DIAGNOSIS — Z992 Dependence on renal dialysis: Secondary | ICD-10-CM | POA: Diagnosis not present

## 2021-08-12 DIAGNOSIS — D509 Iron deficiency anemia, unspecified: Secondary | ICD-10-CM | POA: Diagnosis not present

## 2021-08-12 DIAGNOSIS — N186 End stage renal disease: Secondary | ICD-10-CM | POA: Diagnosis not present

## 2021-08-12 DIAGNOSIS — N2581 Secondary hyperparathyroidism of renal origin: Secondary | ICD-10-CM | POA: Diagnosis not present

## 2021-08-15 DIAGNOSIS — N186 End stage renal disease: Secondary | ICD-10-CM | POA: Diagnosis not present

## 2021-08-15 DIAGNOSIS — D509 Iron deficiency anemia, unspecified: Secondary | ICD-10-CM | POA: Diagnosis not present

## 2021-08-15 DIAGNOSIS — N2581 Secondary hyperparathyroidism of renal origin: Secondary | ICD-10-CM | POA: Diagnosis not present

## 2021-08-15 DIAGNOSIS — D631 Anemia in chronic kidney disease: Secondary | ICD-10-CM | POA: Diagnosis not present

## 2021-08-15 DIAGNOSIS — Z992 Dependence on renal dialysis: Secondary | ICD-10-CM | POA: Diagnosis not present

## 2021-08-16 DIAGNOSIS — U071 COVID-19: Secondary | ICD-10-CM | POA: Insufficient documentation

## 2021-08-16 MED ORDER — HYDROCOD POLST-CPM POLST ER 10-8 MG/5ML PO SUER: 5.0000 mL | Freq: Two times a day (BID) | ORAL | 0 refills | Status: DC | PRN

## 2021-08-16 MED ORDER — AZITHROMYCIN 250 MG PO TABS: ORAL_TABLET | ORAL | 0 refills | Status: DC

## 2021-08-16 NOTE — Telephone Encounter (Signed)
He and his wife tested positive for the Covid-19 virus on 08-12-21, and he has been coughing for 8 days. He had a fever at first but not now. He has a cough that produces green sputum. No chest pain or SOB. He had body aches and diarrhea as well, but these have stopped. We will treat him with a Zpack. Recheck as needed.

## 2021-08-17 ENCOUNTER — Telehealth: Payer: Self-pay

## 2021-08-17 DIAGNOSIS — N186 End stage renal disease: Secondary | ICD-10-CM | POA: Diagnosis not present

## 2021-08-17 DIAGNOSIS — Z992 Dependence on renal dialysis: Secondary | ICD-10-CM | POA: Diagnosis not present

## 2021-08-17 DIAGNOSIS — D631 Anemia in chronic kidney disease: Secondary | ICD-10-CM | POA: Diagnosis not present

## 2021-08-17 DIAGNOSIS — N2581 Secondary hyperparathyroidism of renal origin: Secondary | ICD-10-CM | POA: Diagnosis not present

## 2021-08-17 DIAGNOSIS — D509 Iron deficiency anemia, unspecified: Secondary | ICD-10-CM | POA: Diagnosis not present

## 2021-08-17 NOTE — Telephone Encounter (Signed)
Wife of patient called stated patient did not receive Rx for antibiotic after having virtual visit with patient and wife

## 2021-08-17 NOTE — Telephone Encounter (Signed)
Spoke with pt pharmacy state that pt Rx is ready for pick up, pt notified that the cough syrup is not covered by his insurance, advised pt that he will need to pay $33.40 out of pocket for the cough syrup, verbalized understanding

## 2021-08-19 DIAGNOSIS — D631 Anemia in chronic kidney disease: Secondary | ICD-10-CM | POA: Diagnosis not present

## 2021-08-19 DIAGNOSIS — Z992 Dependence on renal dialysis: Secondary | ICD-10-CM | POA: Diagnosis not present

## 2021-08-19 DIAGNOSIS — N2581 Secondary hyperparathyroidism of renal origin: Secondary | ICD-10-CM | POA: Diagnosis not present

## 2021-08-19 DIAGNOSIS — D509 Iron deficiency anemia, unspecified: Secondary | ICD-10-CM | POA: Diagnosis not present

## 2021-08-19 DIAGNOSIS — N186 End stage renal disease: Secondary | ICD-10-CM | POA: Diagnosis not present

## 2021-08-22 DIAGNOSIS — N2581 Secondary hyperparathyroidism of renal origin: Secondary | ICD-10-CM | POA: Diagnosis not present

## 2021-08-22 DIAGNOSIS — D631 Anemia in chronic kidney disease: Secondary | ICD-10-CM | POA: Diagnosis not present

## 2021-08-22 DIAGNOSIS — D509 Iron deficiency anemia, unspecified: Secondary | ICD-10-CM | POA: Diagnosis not present

## 2021-08-22 DIAGNOSIS — N186 End stage renal disease: Secondary | ICD-10-CM | POA: Diagnosis not present

## 2021-08-22 DIAGNOSIS — Z992 Dependence on renal dialysis: Secondary | ICD-10-CM | POA: Diagnosis not present

## 2021-08-23 ENCOUNTER — Encounter: Payer: Medicare Other | Admitting: Family Medicine

## 2021-08-24 DIAGNOSIS — I129 Hypertensive chronic kidney disease with stage 1 through stage 4 chronic kidney disease, or unspecified chronic kidney disease: Secondary | ICD-10-CM | POA: Diagnosis not present

## 2021-08-24 DIAGNOSIS — D509 Iron deficiency anemia, unspecified: Secondary | ICD-10-CM | POA: Diagnosis not present

## 2021-08-24 DIAGNOSIS — N186 End stage renal disease: Secondary | ICD-10-CM | POA: Diagnosis not present

## 2021-08-24 DIAGNOSIS — N2581 Secondary hyperparathyroidism of renal origin: Secondary | ICD-10-CM | POA: Diagnosis not present

## 2021-08-24 DIAGNOSIS — Z992 Dependence on renal dialysis: Secondary | ICD-10-CM | POA: Diagnosis not present

## 2021-08-24 DIAGNOSIS — D631 Anemia in chronic kidney disease: Secondary | ICD-10-CM | POA: Diagnosis not present

## 2021-08-26 DIAGNOSIS — N2581 Secondary hyperparathyroidism of renal origin: Secondary | ICD-10-CM | POA: Diagnosis not present

## 2021-08-26 DIAGNOSIS — Z992 Dependence on renal dialysis: Secondary | ICD-10-CM | POA: Diagnosis not present

## 2021-08-26 DIAGNOSIS — D631 Anemia in chronic kidney disease: Secondary | ICD-10-CM | POA: Diagnosis not present

## 2021-08-26 DIAGNOSIS — N186 End stage renal disease: Secondary | ICD-10-CM | POA: Diagnosis not present

## 2021-08-26 DIAGNOSIS — D509 Iron deficiency anemia, unspecified: Secondary | ICD-10-CM | POA: Diagnosis not present

## 2021-08-26 DIAGNOSIS — L299 Pruritus, unspecified: Secondary | ICD-10-CM | POA: Diagnosis not present

## 2021-08-29 DIAGNOSIS — Z992 Dependence on renal dialysis: Secondary | ICD-10-CM | POA: Diagnosis not present

## 2021-08-29 DIAGNOSIS — L299 Pruritus, unspecified: Secondary | ICD-10-CM | POA: Diagnosis not present

## 2021-08-29 DIAGNOSIS — D509 Iron deficiency anemia, unspecified: Secondary | ICD-10-CM | POA: Diagnosis not present

## 2021-08-29 DIAGNOSIS — N186 End stage renal disease: Secondary | ICD-10-CM | POA: Diagnosis not present

## 2021-08-29 DIAGNOSIS — N2581 Secondary hyperparathyroidism of renal origin: Secondary | ICD-10-CM | POA: Diagnosis not present

## 2021-08-29 DIAGNOSIS — D631 Anemia in chronic kidney disease: Secondary | ICD-10-CM | POA: Diagnosis not present

## 2021-08-31 DIAGNOSIS — N186 End stage renal disease: Secondary | ICD-10-CM | POA: Diagnosis not present

## 2021-08-31 DIAGNOSIS — N2581 Secondary hyperparathyroidism of renal origin: Secondary | ICD-10-CM | POA: Diagnosis not present

## 2021-08-31 DIAGNOSIS — D509 Iron deficiency anemia, unspecified: Secondary | ICD-10-CM | POA: Diagnosis not present

## 2021-08-31 DIAGNOSIS — L299 Pruritus, unspecified: Secondary | ICD-10-CM | POA: Diagnosis not present

## 2021-08-31 DIAGNOSIS — D631 Anemia in chronic kidney disease: Secondary | ICD-10-CM | POA: Diagnosis not present

## 2021-08-31 DIAGNOSIS — Z992 Dependence on renal dialysis: Secondary | ICD-10-CM | POA: Diagnosis not present

## 2021-09-02 DIAGNOSIS — L299 Pruritus, unspecified: Secondary | ICD-10-CM | POA: Diagnosis not present

## 2021-09-02 DIAGNOSIS — D631 Anemia in chronic kidney disease: Secondary | ICD-10-CM | POA: Diagnosis not present

## 2021-09-02 DIAGNOSIS — N2581 Secondary hyperparathyroidism of renal origin: Secondary | ICD-10-CM | POA: Diagnosis not present

## 2021-09-02 DIAGNOSIS — Z992 Dependence on renal dialysis: Secondary | ICD-10-CM | POA: Diagnosis not present

## 2021-09-02 DIAGNOSIS — N186 End stage renal disease: Secondary | ICD-10-CM | POA: Diagnosis not present

## 2021-09-02 DIAGNOSIS — D509 Iron deficiency anemia, unspecified: Secondary | ICD-10-CM | POA: Diagnosis not present

## 2021-09-05 ENCOUNTER — Telehealth: Payer: Self-pay | Admitting: Cardiovascular Disease

## 2021-09-05 DIAGNOSIS — D631 Anemia in chronic kidney disease: Secondary | ICD-10-CM | POA: Diagnosis not present

## 2021-09-05 DIAGNOSIS — L299 Pruritus, unspecified: Secondary | ICD-10-CM | POA: Diagnosis not present

## 2021-09-05 DIAGNOSIS — Z992 Dependence on renal dialysis: Secondary | ICD-10-CM | POA: Diagnosis not present

## 2021-09-05 DIAGNOSIS — N186 End stage renal disease: Secondary | ICD-10-CM | POA: Diagnosis not present

## 2021-09-05 DIAGNOSIS — D509 Iron deficiency anemia, unspecified: Secondary | ICD-10-CM | POA: Diagnosis not present

## 2021-09-05 DIAGNOSIS — N2581 Secondary hyperparathyroidism of renal origin: Secondary | ICD-10-CM | POA: Diagnosis not present

## 2021-09-05 NOTE — Telephone Encounter (Signed)
SAMPLE REQUEST BUT MAY QUALIFY FOR DOSE INCREASE. ROUTING TO THE PHARMD POOL TO THEN ROUTE BACK TO NL TRIAGE POOL.  Prescription sample request for Eliquis received. Indication:DVT Last office visit:CROITORU 09/23/20 Scr:6.7 08/07/21 Age: 26M Weight:143KG

## 2021-09-05 NOTE — Telephone Encounter (Signed)
Upon further review, it actually looks like he has called regularly for Eliquis samples dating back to 2015, many older notes stating samples not available. Cannot regularly rely on samples each month as we have many patients with high med copays and we don't have enough samples for everyone. Also don't want him just not taking anticoagulation during the times he calls and we don't have any samples. Need a better long term solution.   Looks like social work has previously been involved and pt was only denied for Eliquis pt assistance because he hadn't spent enough out of pocket on meds, not because his household income was too high. May be worth having him apply for either Medicaid or Low Income Subsidy through Medicare; if he qualified, would help with all of his med costs. If he doesn't qualify, would need to change to warfarin for hx of DVT and PE.

## 2021-09-05 NOTE — Telephone Encounter (Signed)
Pt has been calling for (and receiving) samples of Eliquis since April. He cannot rely on samples of Eliquis the entire year. He has Medicare insurance so his copay is $47/month after he pays through a $95 deductible. Need to see if that copay was expensive for him or if he's had a higher copay in the donut hole and that's why he's request samples (I don't think this is the case as Eliquis is his only branded med so he wouldn't have been in the donut hole yet back in April when he first started calling for samples).   He should apply for Eliquis pt assistance. If he cannot afford normal Eliquis copay with his Medicare insurance / if he is denied pt assistance, he needs to change to warfarin.  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 Ok to give him 2 week supply of Eliquis samples but he must apply for pt assistance, he cannot continue to receive free samples the entire year.

## 2021-09-05 NOTE — Telephone Encounter (Signed)
Patient calling the office for samples of medication:   1.  What medication and dosage are you requesting samples for? apixaban (ELIQUIS) 2.5 MG TABS tablet   2.  Are you currently out of this medication? Yes    

## 2021-09-05 NOTE — Telephone Encounter (Signed)
Samples placed up- patient states that he has tried everything to get the medication but has not been able.  I will route to primary RN to make aware.  Thanks!

## 2021-09-06 ENCOUNTER — Telehealth: Payer: Self-pay | Admitting: Licensed Clinical Social Worker

## 2021-09-06 NOTE — Telephone Encounter (Signed)
LCSW called [place] Patient Assistance Program this morning.  No recent patient assistance application on file since 2020. I then attempted to reach pt to go over the application. No answer at [phone], message left. Will have PAP application ready for pt to complete if he returns my call. Will reattempt tomorrow if no return call.   Westley Hummer, MSW, [place]  [phone]

## 2021-09-07 DIAGNOSIS — N186 End stage renal disease: Secondary | ICD-10-CM | POA: Diagnosis not present

## 2021-09-07 DIAGNOSIS — Z992 Dependence on renal dialysis: Secondary | ICD-10-CM | POA: Diagnosis not present

## 2021-09-07 DIAGNOSIS — D631 Anemia in chronic kidney disease: Secondary | ICD-10-CM | POA: Diagnosis not present

## 2021-09-07 DIAGNOSIS — L299 Pruritus, unspecified: Secondary | ICD-10-CM | POA: Diagnosis not present

## 2021-09-07 DIAGNOSIS — N2581 Secondary hyperparathyroidism of renal origin: Secondary | ICD-10-CM | POA: Diagnosis not present

## 2021-09-07 DIAGNOSIS — D509 Iron deficiency anemia, unspecified: Secondary | ICD-10-CM | POA: Diagnosis not present

## 2021-09-07 NOTE — Telephone Encounter (Signed)
Spoke with pt and pt wife, I introduced self, role, reason for call. Initially just spoke with pt and then pt wife joined the call. I explained that pt has not applied for assistance since 2020 and the current options for patient assistance.  Both pt and pt wife very upset that someone other than Lisa/Dr. C calling them back. They state they are not interested in completing any patient assistance at this time and state that they are also not interested in any alternative medication options. They are upset that they cannot continue to receive samples and again state they only want to speak with Dr. Loletha Grayer and Lattie Haw. At this time I defer back to pharmacy/medical team.   Westley Hummer, MSW, [place]  [phone]

## 2021-09-07 NOTE — Telephone Encounter (Addendum)
Was able to reach pt and pt wife this morning. Please see phone encounter dated 9/12 for further information. Pt wife spoke in a very loud tone along with intermittant cursing about her frustrations with the process that we "keep putting them through." Pt wife very frustrated that she and her husband have been denied for various programs like disability, SNAP and medications. At this time pt and pt wife not interested in completing assistance applications. Requests to only speak with Dr. Berneta Levins, RN. I have spoken with both Dr. Demetrio Lapping, and Jinny Blossom to make them aware of pt/pt wife frustrations and defer to them for ongoing management.   Westley Hummer, MSW, [place]  [phone]

## 2021-09-09 ENCOUNTER — Encounter: Payer: Self-pay | Admitting: Family Medicine

## 2021-09-09 DIAGNOSIS — D631 Anemia in chronic kidney disease: Secondary | ICD-10-CM | POA: Diagnosis not present

## 2021-09-09 DIAGNOSIS — N2581 Secondary hyperparathyroidism of renal origin: Secondary | ICD-10-CM | POA: Diagnosis not present

## 2021-09-09 DIAGNOSIS — N186 End stage renal disease: Secondary | ICD-10-CM | POA: Diagnosis not present

## 2021-09-09 DIAGNOSIS — L299 Pruritus, unspecified: Secondary | ICD-10-CM | POA: Diagnosis not present

## 2021-09-09 DIAGNOSIS — Z992 Dependence on renal dialysis: Secondary | ICD-10-CM | POA: Diagnosis not present

## 2021-09-09 DIAGNOSIS — D509 Iron deficiency anemia, unspecified: Secondary | ICD-10-CM | POA: Diagnosis not present

## 2021-09-12 DIAGNOSIS — R0902 Hypoxemia: Secondary | ICD-10-CM | POA: Diagnosis not present

## 2021-09-12 DIAGNOSIS — N2581 Secondary hyperparathyroidism of renal origin: Secondary | ICD-10-CM | POA: Diagnosis not present

## 2021-09-12 DIAGNOSIS — Z743 Need for continuous supervision: Secondary | ICD-10-CM | POA: Diagnosis not present

## 2021-09-12 DIAGNOSIS — D509 Iron deficiency anemia, unspecified: Secondary | ICD-10-CM | POA: Diagnosis not present

## 2021-09-12 DIAGNOSIS — D631 Anemia in chronic kidney disease: Secondary | ICD-10-CM | POA: Diagnosis not present

## 2021-09-12 DIAGNOSIS — N186 End stage renal disease: Secondary | ICD-10-CM | POA: Diagnosis not present

## 2021-09-12 DIAGNOSIS — R109 Unspecified abdominal pain: Secondary | ICD-10-CM | POA: Diagnosis not present

## 2021-09-12 DIAGNOSIS — Z992 Dependence on renal dialysis: Secondary | ICD-10-CM | POA: Diagnosis not present

## 2021-09-12 DIAGNOSIS — L299 Pruritus, unspecified: Secondary | ICD-10-CM | POA: Diagnosis not present

## 2021-09-12 DIAGNOSIS — R140 Abdominal distension (gaseous): Secondary | ICD-10-CM | POA: Diagnosis not present

## 2021-09-13 NOTE — Telephone Encounter (Signed)
Yes he can take Zyrtec BID as needed for itching (the box says once a day but twice is okay)

## 2021-09-14 ENCOUNTER — Other Ambulatory Visit: Payer: Self-pay

## 2021-09-14 DIAGNOSIS — Z992 Dependence on renal dialysis: Secondary | ICD-10-CM | POA: Diagnosis not present

## 2021-09-14 DIAGNOSIS — N2581 Secondary hyperparathyroidism of renal origin: Secondary | ICD-10-CM | POA: Diagnosis not present

## 2021-09-14 DIAGNOSIS — N186 End stage renal disease: Secondary | ICD-10-CM | POA: Diagnosis not present

## 2021-09-14 DIAGNOSIS — D509 Iron deficiency anemia, unspecified: Secondary | ICD-10-CM | POA: Diagnosis not present

## 2021-09-14 DIAGNOSIS — D631 Anemia in chronic kidney disease: Secondary | ICD-10-CM | POA: Diagnosis not present

## 2021-09-14 DIAGNOSIS — L299 Pruritus, unspecified: Secondary | ICD-10-CM | POA: Diagnosis not present

## 2021-09-15 ENCOUNTER — Telehealth: Payer: Self-pay | Admitting: Cardiovascular Disease

## 2021-09-15 DIAGNOSIS — I714 Abdominal aortic aneurysm, without rupture: Secondary | ICD-10-CM | POA: Diagnosis not present

## 2021-09-15 DIAGNOSIS — I9789 Other postprocedural complications and disorders of the circulatory system, not elsewhere classified: Secondary | ICD-10-CM | POA: Diagnosis not present

## 2021-09-15 MED ORDER — AMLODIPINE BESYLATE 5 MG PO TABS: 5.0000 mg | ORAL_TABLET | Freq: Every day | ORAL | 0 refills | Status: DC

## 2021-09-15 NOTE — Telephone Encounter (Signed)
Patient's wife states the patient has been completed out of medication for the past few days. She would like to have his Rx transferred from [place] to [facility].  *STAT* If patient is at the pharmacy, call can be transferred to refill team.   1. Which medications need to be refilled? (please list name of each medication and dose if known)  amLODipine (NORVASC) 5 MG tablet   2. Which pharmacy/location (including street and city if local pharmacy) is medication to be sent to?  [facility]  ([place], [address]  3. Do they need a 30 day or 90 day supply?  90 day supply

## 2021-09-16 DIAGNOSIS — D631 Anemia in chronic kidney disease: Secondary | ICD-10-CM | POA: Diagnosis not present

## 2021-09-16 DIAGNOSIS — D509 Iron deficiency anemia, unspecified: Secondary | ICD-10-CM | POA: Diagnosis not present

## 2021-09-16 DIAGNOSIS — Z992 Dependence on renal dialysis: Secondary | ICD-10-CM | POA: Diagnosis not present

## 2021-09-16 DIAGNOSIS — L299 Pruritus, unspecified: Secondary | ICD-10-CM | POA: Diagnosis not present

## 2021-09-16 DIAGNOSIS — N186 End stage renal disease: Secondary | ICD-10-CM | POA: Diagnosis not present

## 2021-09-16 DIAGNOSIS — N2581 Secondary hyperparathyroidism of renal origin: Secondary | ICD-10-CM | POA: Diagnosis not present

## 2021-09-19 DIAGNOSIS — N186 End stage renal disease: Secondary | ICD-10-CM | POA: Diagnosis not present

## 2021-09-19 DIAGNOSIS — D509 Iron deficiency anemia, unspecified: Secondary | ICD-10-CM | POA: Diagnosis not present

## 2021-09-19 DIAGNOSIS — D631 Anemia in chronic kidney disease: Secondary | ICD-10-CM | POA: Diagnosis not present

## 2021-09-19 DIAGNOSIS — L299 Pruritus, unspecified: Secondary | ICD-10-CM | POA: Diagnosis not present

## 2021-09-19 DIAGNOSIS — Z992 Dependence on renal dialysis: Secondary | ICD-10-CM | POA: Diagnosis not present

## 2021-09-19 DIAGNOSIS — N2581 Secondary hyperparathyroidism of renal origin: Secondary | ICD-10-CM | POA: Diagnosis not present

## 2021-09-20 ENCOUNTER — Other Ambulatory Visit: Payer: Self-pay

## 2021-09-20 ENCOUNTER — Other Ambulatory Visit: Payer: Self-pay | Admitting: *Deleted

## 2021-09-20 ENCOUNTER — Telehealth: Payer: Self-pay | Admitting: *Deleted

## 2021-09-20 MED ORDER — CARVEDILOL 25 MG PO TABS
25.0000 mg | ORAL_TABLET | Freq: Two times a day (BID) | ORAL | 0 refills | Status: DC
Start: 2021-09-20 — End: 2021-09-22

## 2021-09-20 NOTE — Telephone Encounter (Signed)
pharmacist stated that pt medication amlodipine was sent in as 5 mg tablets and pt takes 10 mg tablets. Does pt supposed to still be taking 5mg  or 10mg  tablets of the amlodipine. Please address

## 2021-09-20 NOTE — Telephone Encounter (Signed)
[facility] mail order pharmacy calling requesting a refill on carvedilol and amlodipine 10 mg tablets. pharmacist stated that pt medication amlodipine was sent in as 5 mg tablets and pt takes 10 mg tablets. Does pt supposed to still be taking 5mg  or 10mg  tablets of the amlodipine. Please address

## 2021-09-20 NOTE — Telephone Encounter (Signed)
Spoke with pt's wife and pt is needing refill on Amlodipine Per wife pt is taking 10 mg (looking at bottle)Kidney MD changed dosage and pt is needing refill Will send to Dr Sallyanne Kuster for okay to refill Amlodipine at 10 mg .[name]

## 2021-09-21 ENCOUNTER — Telehealth: Payer: Self-pay | Admitting: Cardiovascular Disease

## 2021-09-21 DIAGNOSIS — Z992 Dependence on renal dialysis: Secondary | ICD-10-CM | POA: Diagnosis not present

## 2021-09-21 DIAGNOSIS — L299 Pruritus, unspecified: Secondary | ICD-10-CM | POA: Diagnosis not present

## 2021-09-21 DIAGNOSIS — D509 Iron deficiency anemia, unspecified: Secondary | ICD-10-CM | POA: Diagnosis not present

## 2021-09-21 DIAGNOSIS — N2581 Secondary hyperparathyroidism of renal origin: Secondary | ICD-10-CM | POA: Diagnosis not present

## 2021-09-21 DIAGNOSIS — D631 Anemia in chronic kidney disease: Secondary | ICD-10-CM | POA: Diagnosis not present

## 2021-09-21 DIAGNOSIS — N186 End stage renal disease: Secondary | ICD-10-CM | POA: Diagnosis not present

## 2021-09-21 NOTE — Telephone Encounter (Signed)
Returned call to [facility], advised them that message has been sent to Dr. Loletha Grayer for him to review dose change as this was not reflected upon chart review. Per [facility] they received refill request for 5mg  Amlodipine and this has already been filled and sent to patient. Advised that upon chart review it looks as if his Kidney Dr. Increased dose so he should reach out to them for updated prescription in the mean time. Advised that message has already been sent to Dr. Loletha Grayer regarding possible dose change.

## 2021-09-21 NOTE — Telephone Encounter (Signed)
   Pt c/o medication issue:  1. Name of Medication:   amLODipine (NORVASC) 5 MG tablet   2. How are you currently taking this medication (dosage and times per day)? Take 1 tablet (5 mg total) by mouth daily.  3. Are you having a reaction (difficulty breathing--STAT)?   4. What is your medication issue? Daneil Dan with [facility] calling, she said pt told them, pt is taking 10 mg, Dr. Loletha Grayer increased it. They want to confirm

## 2021-09-22 ENCOUNTER — Other Ambulatory Visit: Payer: Self-pay | Admitting: Family Medicine

## 2021-09-22 ENCOUNTER — Other Ambulatory Visit: Payer: Self-pay | Admitting: Cardiovascular Disease

## 2021-09-22 ENCOUNTER — Other Ambulatory Visit: Payer: Self-pay

## 2021-09-22 MED ORDER — CARVEDILOL 25 MG PO TABS: 25.0000 mg | ORAL_TABLET | Freq: Two times a day (BID) | ORAL | 0 refills | Status: AC

## 2021-09-23 DIAGNOSIS — I129 Hypertensive chronic kidney disease with stage 1 through stage 4 chronic kidney disease, or unspecified chronic kidney disease: Secondary | ICD-10-CM | POA: Diagnosis not present

## 2021-09-23 DIAGNOSIS — D631 Anemia in chronic kidney disease: Secondary | ICD-10-CM | POA: Diagnosis not present

## 2021-09-23 DIAGNOSIS — Z992 Dependence on renal dialysis: Secondary | ICD-10-CM | POA: Diagnosis not present

## 2021-09-23 DIAGNOSIS — N2581 Secondary hyperparathyroidism of renal origin: Secondary | ICD-10-CM | POA: Diagnosis not present

## 2021-09-23 DIAGNOSIS — D509 Iron deficiency anemia, unspecified: Secondary | ICD-10-CM | POA: Diagnosis not present

## 2021-09-23 DIAGNOSIS — L299 Pruritus, unspecified: Secondary | ICD-10-CM | POA: Diagnosis not present

## 2021-09-23 DIAGNOSIS — N186 End stage renal disease: Secondary | ICD-10-CM | POA: Diagnosis not present

## 2021-09-23 NOTE — Telephone Encounter (Signed)
Pt needs appointment for further refills 

## 2021-09-26 DIAGNOSIS — Z992 Dependence on renal dialysis: Secondary | ICD-10-CM | POA: Diagnosis not present

## 2021-09-26 DIAGNOSIS — D509 Iron deficiency anemia, unspecified: Secondary | ICD-10-CM | POA: Diagnosis not present

## 2021-09-26 DIAGNOSIS — N2581 Secondary hyperparathyroidism of renal origin: Secondary | ICD-10-CM | POA: Diagnosis not present

## 2021-09-26 DIAGNOSIS — D631 Anemia in chronic kidney disease: Secondary | ICD-10-CM | POA: Diagnosis not present

## 2021-09-26 DIAGNOSIS — N186 End stage renal disease: Secondary | ICD-10-CM | POA: Diagnosis not present

## 2021-09-28 DIAGNOSIS — D509 Iron deficiency anemia, unspecified: Secondary | ICD-10-CM | POA: Diagnosis not present

## 2021-09-28 DIAGNOSIS — N2581 Secondary hyperparathyroidism of renal origin: Secondary | ICD-10-CM | POA: Diagnosis not present

## 2021-09-28 DIAGNOSIS — D631 Anemia in chronic kidney disease: Secondary | ICD-10-CM | POA: Diagnosis not present

## 2021-09-28 DIAGNOSIS — N186 End stage renal disease: Secondary | ICD-10-CM | POA: Diagnosis not present

## 2021-09-28 DIAGNOSIS — Z992 Dependence on renal dialysis: Secondary | ICD-10-CM | POA: Diagnosis not present

## 2021-09-30 DIAGNOSIS — N2581 Secondary hyperparathyroidism of renal origin: Secondary | ICD-10-CM | POA: Diagnosis not present

## 2021-09-30 DIAGNOSIS — D631 Anemia in chronic kidney disease: Secondary | ICD-10-CM | POA: Diagnosis not present

## 2021-09-30 DIAGNOSIS — Z992 Dependence on renal dialysis: Secondary | ICD-10-CM | POA: Diagnosis not present

## 2021-09-30 DIAGNOSIS — N186 End stage renal disease: Secondary | ICD-10-CM | POA: Diagnosis not present

## 2021-09-30 DIAGNOSIS — D509 Iron deficiency anemia, unspecified: Secondary | ICD-10-CM | POA: Diagnosis not present

## 2021-10-03 DIAGNOSIS — Z992 Dependence on renal dialysis: Secondary | ICD-10-CM | POA: Diagnosis not present

## 2021-10-03 DIAGNOSIS — N2581 Secondary hyperparathyroidism of renal origin: Secondary | ICD-10-CM | POA: Diagnosis not present

## 2021-10-03 DIAGNOSIS — D631 Anemia in chronic kidney disease: Secondary | ICD-10-CM | POA: Diagnosis not present

## 2021-10-03 DIAGNOSIS — N186 End stage renal disease: Secondary | ICD-10-CM | POA: Diagnosis not present

## 2021-10-03 DIAGNOSIS — D509 Iron deficiency anemia, unspecified: Secondary | ICD-10-CM | POA: Diagnosis not present

## 2021-10-05 DIAGNOSIS — D509 Iron deficiency anemia, unspecified: Secondary | ICD-10-CM | POA: Diagnosis not present

## 2021-10-05 DIAGNOSIS — Z992 Dependence on renal dialysis: Secondary | ICD-10-CM | POA: Diagnosis not present

## 2021-10-05 DIAGNOSIS — D631 Anemia in chronic kidney disease: Secondary | ICD-10-CM | POA: Diagnosis not present

## 2021-10-05 DIAGNOSIS — N2581 Secondary hyperparathyroidism of renal origin: Secondary | ICD-10-CM | POA: Diagnosis not present

## 2021-10-05 DIAGNOSIS — N186 End stage renal disease: Secondary | ICD-10-CM | POA: Diagnosis not present

## 2021-10-07 DIAGNOSIS — D509 Iron deficiency anemia, unspecified: Secondary | ICD-10-CM | POA: Diagnosis not present

## 2021-10-07 DIAGNOSIS — N186 End stage renal disease: Secondary | ICD-10-CM | POA: Diagnosis not present

## 2021-10-07 DIAGNOSIS — Z992 Dependence on renal dialysis: Secondary | ICD-10-CM | POA: Diagnosis not present

## 2021-10-07 DIAGNOSIS — N2581 Secondary hyperparathyroidism of renal origin: Secondary | ICD-10-CM | POA: Diagnosis not present

## 2021-10-07 DIAGNOSIS — D631 Anemia in chronic kidney disease: Secondary | ICD-10-CM | POA: Diagnosis not present

## 2021-10-10 DIAGNOSIS — N2581 Secondary hyperparathyroidism of renal origin: Secondary | ICD-10-CM | POA: Diagnosis not present

## 2021-10-10 DIAGNOSIS — Z992 Dependence on renal dialysis: Secondary | ICD-10-CM | POA: Diagnosis not present

## 2021-10-10 DIAGNOSIS — D631 Anemia in chronic kidney disease: Secondary | ICD-10-CM | POA: Diagnosis not present

## 2021-10-10 DIAGNOSIS — D509 Iron deficiency anemia, unspecified: Secondary | ICD-10-CM | POA: Diagnosis not present

## 2021-10-10 DIAGNOSIS — N186 End stage renal disease: Secondary | ICD-10-CM | POA: Diagnosis not present

## 2021-10-11 ENCOUNTER — Encounter: Payer: Self-pay | Admitting: Family Medicine

## 2021-10-11 ENCOUNTER — Other Ambulatory Visit: Payer: Self-pay

## 2021-10-11 ENCOUNTER — Ambulatory Visit (INDEPENDENT_AMBULATORY_CARE_PROVIDER_SITE_OTHER): Payer: Medicare Other | Admitting: Family Medicine

## 2021-10-11 VITALS — BP 140/80 | HR 89 | Temp 98.9°F | Wt 293.0 lb

## 2021-10-11 DIAGNOSIS — I10 Essential (primary) hypertension: Secondary | ICD-10-CM | POA: Diagnosis not present

## 2021-10-11 DIAGNOSIS — R5382 Chronic fatigue, unspecified: Secondary | ICD-10-CM | POA: Diagnosis not present

## 2021-10-11 DIAGNOSIS — R739 Hyperglycemia, unspecified: Secondary | ICD-10-CM

## 2021-10-11 DIAGNOSIS — D649 Anemia, unspecified: Secondary | ICD-10-CM

## 2021-10-11 DIAGNOSIS — N184 Chronic kidney disease, stage 4 (severe): Secondary | ICD-10-CM | POA: Diagnosis not present

## 2021-10-11 LAB — CBC WITH DIFFERENTIAL/PLATELET
Basophils Absolute: 0 10*3/uL (ref 0.0–0.1)
Basophils Relative: 0.4 % (ref 0.0–3.0)
Eosinophils Absolute: 0.1 10*3/uL (ref 0.0–0.7)
Eosinophils Relative: 0.6 % (ref 0.0–5.0)
HCT: 28.1 % — ABNORMAL LOW (ref 39.0–52.0)
Hemoglobin: 9.2 g/dL — ABNORMAL LOW (ref 13.0–17.0)
Lymphocytes Relative: 5.9 % — ABNORMAL LOW (ref 12.0–46.0)
Lymphs Abs: 0.5 10*3/uL — ABNORMAL LOW (ref 0.7–4.0)
MCHC: 32.6 g/dL (ref 30.0–36.0)
MCV: 82.7 fl (ref 78.0–100.0)
Monocytes Absolute: 1.1 10*3/uL — ABNORMAL HIGH (ref 0.1–1.0)
Monocytes Relative: 12.4 % — ABNORMAL HIGH (ref 3.0–12.0)
Neutro Abs: 6.9 10*3/uL (ref 1.4–7.7)
Neutrophils Relative %: 80.7 % — ABNORMAL HIGH (ref 43.0–77.0)
Platelets: 153 10*3/uL (ref 150.0–400.0)
RBC: 3.4 Mil/uL — ABNORMAL LOW (ref 4.22–5.81)
RDW: 17.7 % — ABNORMAL HIGH (ref 11.5–15.5)
WBC: 8.5 10*3/uL (ref 4.0–10.5)

## 2021-10-11 LAB — IBC + FERRITIN
Ferritin: 2373.5 ng/mL — ABNORMAL HIGH (ref 22.0–322.0)
Iron: 19 ug/dL — ABNORMAL LOW (ref 42–165)
Saturation Ratios: 18.8 % — ABNORMAL LOW (ref 20.0–50.0)
TIBC: 100.8 ug/dL — ABNORMAL LOW (ref 250.0–450.0)
Transferrin: 72 mg/dL — ABNORMAL LOW (ref 212.0–360.0)

## 2021-10-11 LAB — HEMOGLOBIN A1C: Hgb A1c MFr Bld: 4.8 % (ref 4.6–6.5)

## 2021-10-11 LAB — IRON: Iron: 19 ug/dL — ABNORMAL LOW (ref 42–165)

## 2021-10-11 LAB — VITAMIN B12: Vitamin B-12: 618 pg/mL (ref 211–911)

## 2021-10-11 MED ORDER — AMLODIPINE BESYLATE 10 MG PO TABS: 10.0000 mg | ORAL_TABLET | Freq: Every day | ORAL | 3 refills | Status: AC

## 2021-10-11 NOTE — Progress Notes (Signed)
   Subjective:    Patient ID: [name], male    DOB: October 26, 1971, 50 y.o.   MRN: [id number]  HPI Here to complain of feeling extremely fatigued all the time. This has been going on for several months. He gets regular dialysis at the [facility], and the most recent Hgb I can find from August was 7.8. His electrolytes were stable. His random glucoses have been as high as 181. No fever or other signs of infection.    Review of Systems  Constitutional:  Positive for fatigue.  Respiratory: Negative.    Cardiovascular: Negative.   Gastrointestinal: Negative.   Genitourinary: Negative.       Objective:   Physical Exam Constitutional:      Appearance: Normal appearance.  Cardiovascular:     Rate and Rhythm: Normal rate and regular rhythm.     Pulses: Normal pulses.     Heart sounds: Normal heart sounds.  Pulmonary:     Effort: Pulmonary effort is normal.     Breath sounds: Normal breath sounds.  Neurological:     Mental Status: He is alert.          Assessment & Plan:  There are numerous possible etiologies for his fatigue, but the most likely is anemia. Of course, renal disease itself can lead to anemia, but we will check levels for iron, ferritin, and B12 today, as well as another CBC. He has a family hx of diabetes, so we will check an A1c today as well. I asked him to speak to Dr. Edrick Oh, his nephrologist, about the possibility of getting Erythropoietin infusions. We spent a total of ( 33  ) minutes reviewing records and discussing these issues.  Alysia Penna, MD

## 2021-10-11 NOTE — Addendum Note (Signed)
Addended by: Amanda Cockayne on: 10/11/2021 02:04 PM   Modules accepted: Orders

## 2021-10-12 DIAGNOSIS — D509 Iron deficiency anemia, unspecified: Secondary | ICD-10-CM | POA: Diagnosis not present

## 2021-10-12 DIAGNOSIS — N186 End stage renal disease: Secondary | ICD-10-CM | POA: Diagnosis not present

## 2021-10-12 DIAGNOSIS — Z992 Dependence on renal dialysis: Secondary | ICD-10-CM | POA: Diagnosis not present

## 2021-10-12 DIAGNOSIS — N2581 Secondary hyperparathyroidism of renal origin: Secondary | ICD-10-CM | POA: Diagnosis not present

## 2021-10-12 DIAGNOSIS — D631 Anemia in chronic kidney disease: Secondary | ICD-10-CM | POA: Diagnosis not present

## 2021-10-14 DIAGNOSIS — R059 Cough, unspecified: Secondary | ICD-10-CM | POA: Diagnosis not present

## 2021-10-14 DIAGNOSIS — I7121 Aneurysm of the ascending aorta, without rupture: Secondary | ICD-10-CM | POA: Diagnosis not present

## 2021-10-14 DIAGNOSIS — Z95828 Presence of other vascular implants and grafts: Secondary | ICD-10-CM | POA: Diagnosis not present

## 2021-10-14 DIAGNOSIS — Z87891 Personal history of nicotine dependence: Secondary | ICD-10-CM | POA: Diagnosis not present

## 2021-10-14 DIAGNOSIS — I351 Nonrheumatic aortic (valve) insufficiency: Secondary | ICD-10-CM | POA: Diagnosis not present

## 2021-10-14 DIAGNOSIS — Z8616 Personal history of COVID-19: Secondary | ICD-10-CM | POA: Diagnosis not present

## 2021-10-14 DIAGNOSIS — R5383 Other fatigue: Secondary | ICD-10-CM | POA: Diagnosis not present

## 2021-10-14 DIAGNOSIS — Z09 Encounter for follow-up examination after completed treatment for conditions other than malignant neoplasm: Secondary | ICD-10-CM | POA: Diagnosis not present

## 2021-10-14 DIAGNOSIS — D49511 Neoplasm of unspecified behavior of right kidney: Secondary | ICD-10-CM | POA: Diagnosis not present

## 2021-10-14 DIAGNOSIS — Z8679 Personal history of other diseases of the circulatory system: Secondary | ICD-10-CM | POA: Diagnosis not present

## 2021-10-14 DIAGNOSIS — I714 Abdominal aortic aneurysm, without rupture, unspecified: Secondary | ICD-10-CM | POA: Diagnosis not present

## 2021-10-14 DIAGNOSIS — Z7901 Long term (current) use of anticoagulants: Secondary | ICD-10-CM | POA: Diagnosis not present

## 2021-10-14 DIAGNOSIS — R0602 Shortness of breath: Secondary | ICD-10-CM | POA: Diagnosis not present

## 2021-10-14 DIAGNOSIS — Z9889 Other specified postprocedural states: Secondary | ICD-10-CM | POA: Diagnosis not present

## 2021-10-15 DIAGNOSIS — N2581 Secondary hyperparathyroidism of renal origin: Secondary | ICD-10-CM | POA: Diagnosis not present

## 2021-10-15 DIAGNOSIS — D631 Anemia in chronic kidney disease: Secondary | ICD-10-CM | POA: Diagnosis not present

## 2021-10-15 DIAGNOSIS — N186 End stage renal disease: Secondary | ICD-10-CM | POA: Diagnosis not present

## 2021-10-15 DIAGNOSIS — D509 Iron deficiency anemia, unspecified: Secondary | ICD-10-CM | POA: Diagnosis not present

## 2021-10-15 DIAGNOSIS — Z992 Dependence on renal dialysis: Secondary | ICD-10-CM | POA: Diagnosis not present

## 2021-10-17 ENCOUNTER — Telehealth: Payer: Self-pay | Admitting: Family Medicine

## 2021-10-17 DIAGNOSIS — Z992 Dependence on renal dialysis: Secondary | ICD-10-CM | POA: Diagnosis not present

## 2021-10-17 DIAGNOSIS — D631 Anemia in chronic kidney disease: Secondary | ICD-10-CM | POA: Diagnosis not present

## 2021-10-17 DIAGNOSIS — N186 End stage renal disease: Secondary | ICD-10-CM | POA: Diagnosis not present

## 2021-10-17 DIAGNOSIS — D509 Iron deficiency anemia, unspecified: Secondary | ICD-10-CM | POA: Diagnosis not present

## 2021-10-17 DIAGNOSIS — N2581 Secondary hyperparathyroidism of renal origin: Secondary | ICD-10-CM | POA: Diagnosis not present

## 2021-10-17 NOTE — Telephone Encounter (Signed)
Patient's wife called on patient's behalf to ask what the name of the infusion was that Dr.Fry had spoken about during last weeks appointment. Wife states neither can remember and patient is unable to find paper it was written on or find it in [place]. Patient needs the name of infusion so they can tell his kidney doctor. Patient is currently at dialysis now, so they would like to know asap.       Good  callback number is [phone]    Please advise

## 2021-10-17 NOTE — Telephone Encounter (Signed)
Spoke with pt wife state that she spoke with pt nephrologist and has agreed for pt to have the infusion, Pt wife states that the nephrologist wants to know the na me of the infusion medication and also requests for referral to infusion Clinic at the [facility] short stay, states that pt is only available for the infusions on Tuesdays and Thursdays. Please advise

## 2021-10-18 DIAGNOSIS — I129 Hypertensive chronic kidney disease with stage 1 through stage 4 chronic kidney disease, or unspecified chronic kidney disease: Secondary | ICD-10-CM | POA: Diagnosis not present

## 2021-10-18 DIAGNOSIS — N186 End stage renal disease: Secondary | ICD-10-CM | POA: Diagnosis not present

## 2021-10-18 DIAGNOSIS — Z992 Dependence on renal dialysis: Secondary | ICD-10-CM | POA: Diagnosis not present

## 2021-10-25 NOTE — Telephone Encounter (Signed)
Spoke with pt wife advise of Dr Sarajane Jews recommendation, verbalized understanding

## 2021-10-25 NOTE — Telephone Encounter (Signed)
We had discussed an Erythropoeitin infusion to stimulate his bone marrow to make RBC. I have never ordered this directly, I have always referred the patient to either Nephrology or Hematology to set it up

## 2021-10-25 DEATH — deceased

## 2021-11-10 ENCOUNTER — Telehealth: Payer: Self-pay | Admitting: Adult Health

## 2021-11-10 NOTE — Telephone Encounter (Signed)
Late Entry: while covering for PCP, received notice of death on 11/02/2021 that patient had died that day. Death certificate completed

## 2021-11-23 ENCOUNTER — Ambulatory Visit: Payer: Medicare Other | Admitting: Cardiovascular Disease

## 2022-01-10 IMAGING — CT CT ANGIO CHEST
1 of 4 series · 14 of 32 positions shown · IV contrast (APPLIED)
Comparison: CT of the abdomen and pelvis on 10/29/2017, MRA of the
chest on 01/30/2017

CLINICAL DATA: Status Lysann Jot to treat abdominal aortic
dissection with aneurysm formation in 3452. Status post aortic root
replacement with repair of aortic valve in 3661 for aneurysmal
disease of the aortic root.

EXAM:
CT ANGIOGRAPHY CHEST, ABDOMEN AND PELVIS
TECHNIQUE: Non-contrast CT of the chest was initially obtained.

[Series 7: angio · axial · 0.98mm/px · z∈[-678,-74]mm · 14 of 350 slices shown]
[im 24/350  lung]
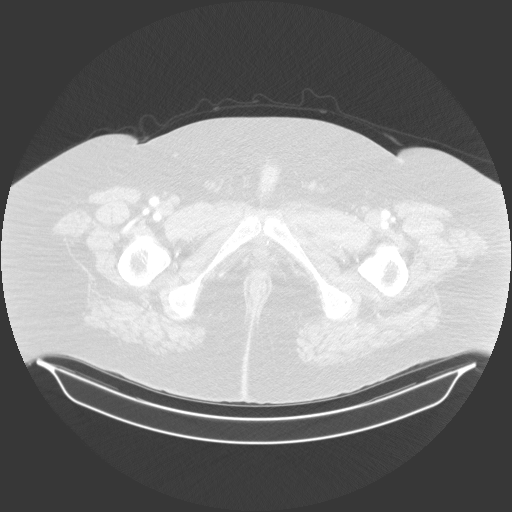
[im 47/350  soft-tissue]
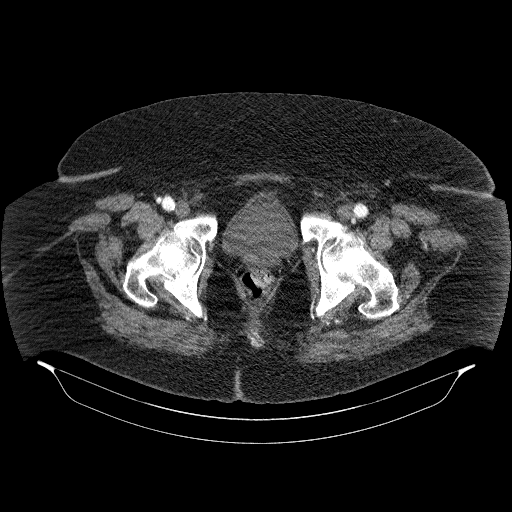
[im 70/350  lung]
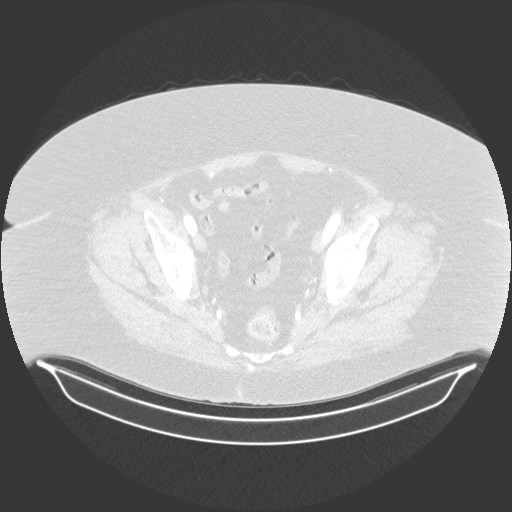
[im 94/350  soft-tissue]
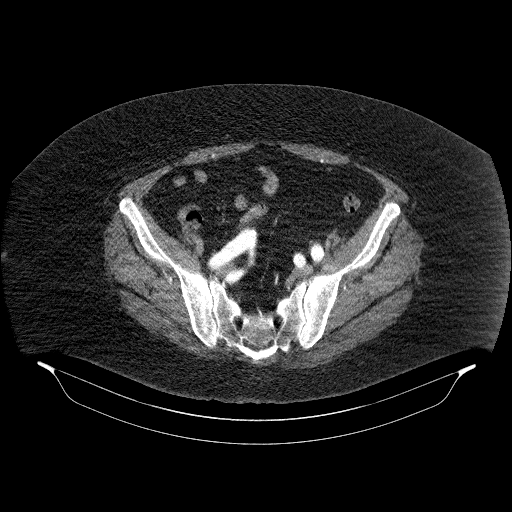
[im 117/350  lung]
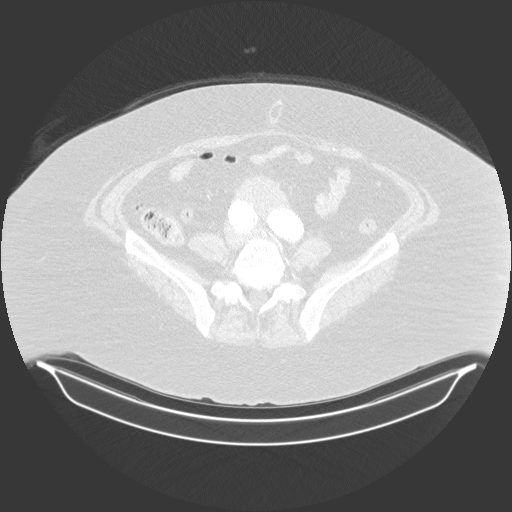
[im 140/350  soft-tissue]
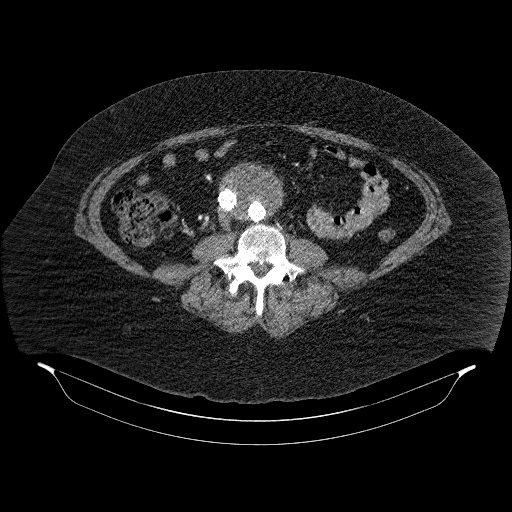
[im 163/350  lung]
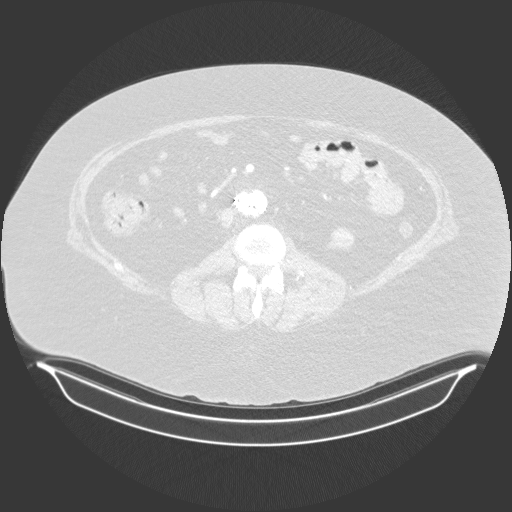
[im 187/350  soft-tissue]
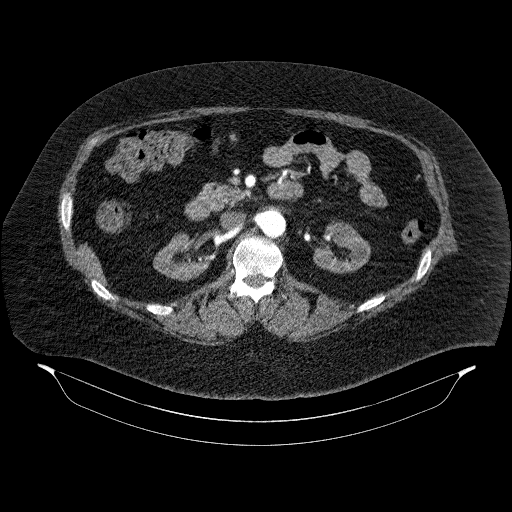
[im 210/350  lung]
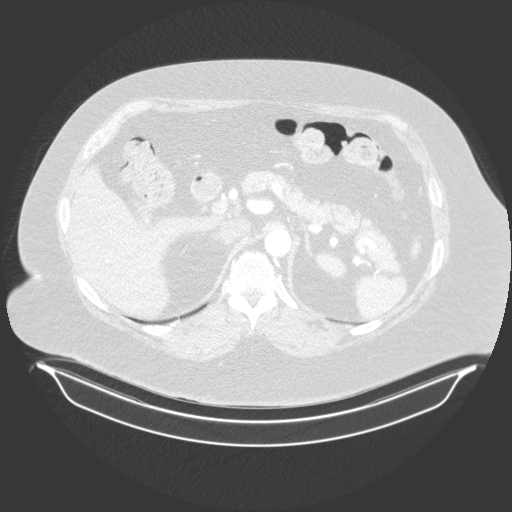
[im 233/350  soft-tissue]
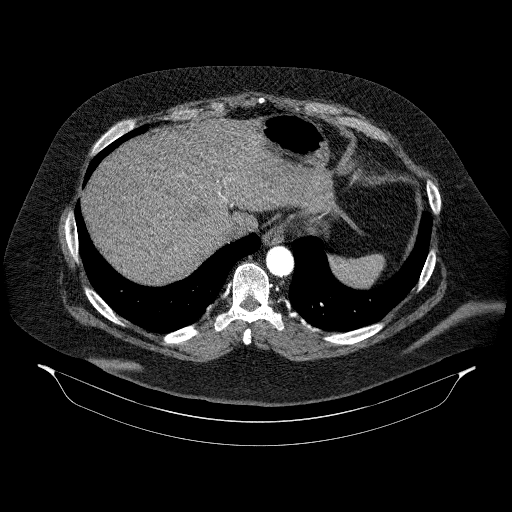
[im 256/350  lung]
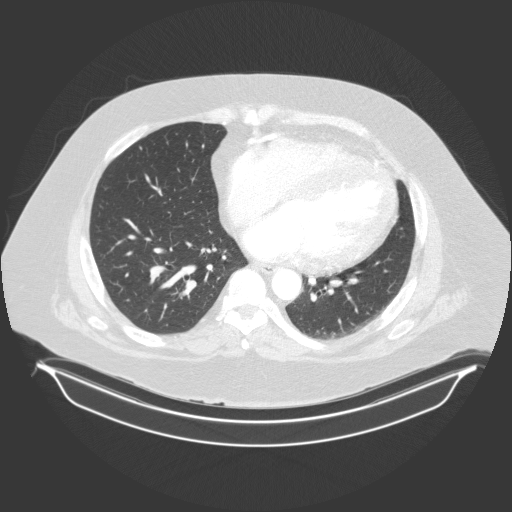
[im 280/350  soft-tissue]
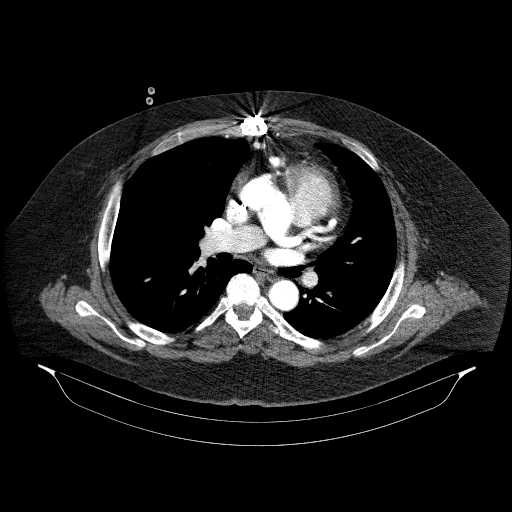
[im 303/350  lung]
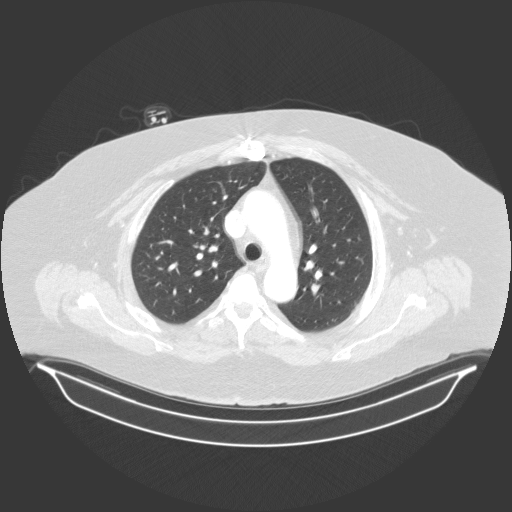
[im 326/350  soft-tissue]
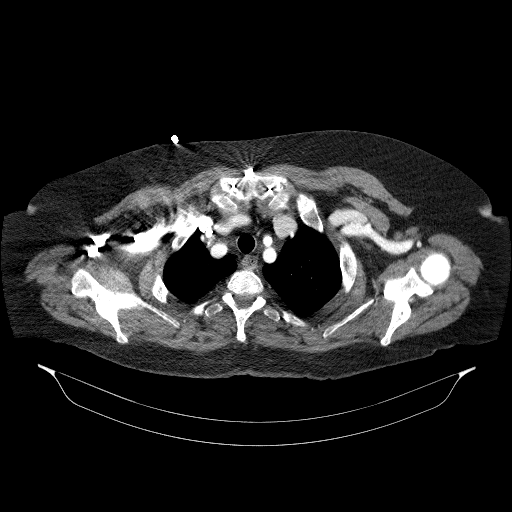

[14 of 32 positions shown; findings below may reference images not displayed]

Multidetector CT imaging through the chest, abdomen and pelvis was
performed using the standard protocol during bolus administration of
intravenous contrast. Multiplanar reconstructed images and MIPs were
obtained and reviewed to evaluate the vascular anatomy.

CONTRAST:  75mL 9HBZW6-CPG IOPAMIDOL (9HBZW6-CPG) INJECTION 76%
FINDINGS: CTA CHEST FINDINGS

Cardiovascular: After aortic root replacement, there is some
residual dilatation of the proximal root measuring up to 4.9-5.0 cm
primarily due to some dilatation of the left posterior aortic sinus.
The ascending aortic graft is normally patent and demonstrates no
evidence anastomotic pseudoaneurysm. The aortic arch measures
2.9-3.3 cm. The descending thoracic aorta measures 2.8 cm. No
evidence of aortic dissection. Visualized proximal great vessels
demonstrate normal patency.

The heart size is normal. No pericardial fluid identified. No
significant calcified coronary artery plaque with suggestion of
minimal plaque in the distribution of the RCA. Tunneled hemodialysis
catheter present via the lower right internal jugular vein with
catheter tip just below the SVC/RA junction. Central pulmonary
arteries are normal in caliber.

Mediastinum/Nodes: No enlarged mediastinal, hilar, or axillary lymph
nodes. Thyroid gland, trachea, and esophagus demonstrate no
significant findings.

Lungs/Pleura: There is no evidence of pulmonary edema,
consolidation, pneumothorax, nodule or pleural fluid.

Musculoskeletal: No chest wall abnormality. No acute or significant
osseous findings.

Review of the MIP images confirms the above findings.

CTA ABDOMEN AND PELVIS FINDINGS

VASCULAR

Aorta: Aortic endograft demonstrates stable positioning since prior
unenhanced studies without evidence of migration or angulation.
Proximal aortic and bilateral common iliac limb apposition appears
intact and stable. There is significant enlargement of the
surrounding aneurysm sac since the prior unenhanced CT in 3661 with
maximal sac dimensions of 5.9 x 6.9 cm. When remeasured ring with
similar axis on the prior exam in 3661, the sac measures 4.2 x
cm. On the arterial phase, there are some opacified lumbar arteries
at the L3, L4 and L5 levels that approach the posterior sac margin.
The superior and posterior aspect of the aneurysm sac does
demonstrate increase in density from the unenhanced phase to the
arterial phase and delayed phase. The more anterior aspect of the
aneurysm sac does not demonstrate progressive increase in density.
Findings would support the presence of a type II endoleak supplied
by one or more lumbar arteries but the actual focal inflow is
difficult to visualize by CTA. This is likely partly due to
technical factors due to the patient's size and degree of arterial
contrast opacification. There is no evidence of a type I endoleak.

Celiac: Fusiform dilatation of a normally patent celiac trunk
measuring up to 13 mm. Distal branch vessels demonstrate normal
patency.

SMA: Normally patent.

Renals: Normally patent bilateral single renal arteries.

IMA: Occluded origin. IMA branches are reconstituted by SMA
collaterals. No evidence of visible

Inflow: Bilateral iliac arteries demonstrate normal patency.
Aneurysmal disease of the right common iliac artery just distal to
the right iliac endograft limb measures 2.4 cm. The left iliac
endograft limb extends to the common iliac bifurcation. The
bifurcation itself measures approximately 2.3 cm. Bilateral internal
and external iliac arteries demonstrate normal patency. Bilateral
common femoral arteries and femoral bifurcations are normally
patent.

Veins: Delayed imaging shows no venous abnormalities in the abdomen
or pelvis.

Review of the MIP images confirms the above findings.

NON-VASCULAR

Hepatobiliary: Unremarkable liver. Multiple gallstones. No evidence
of biliary dilatation.

Pancreas: Unremarkable. No pancreatic ductal dilatation or
surrounding inflammatory changes.

Spleen: Normal in size without focal abnormality.

Adrenals/Urinary Tract: Bilateral atrophic kidneys without evidence
of hydronephrosis. There are several bilateral renal cysts. In
addition, some hyperdense cortical lesions are identified with the
largest measuring 2.4 cm at the level of the lower pole of the right
kidney. These may represent hemorrhagic cysts. Some of these may be
acquired from hemodialysis. Additional follow-up with CT
recommended.

Stomach/Bowel: No evidence of bowel obstruction, ileus, lesion,
inflammation or free air.

Lymphatic: No enlarged lymph nodes identified.

Reproductive: Prostate is unremarkable.

Other: No ascites or abnormal fluid collections. Small umbilical
hernia containing fat.

Musculoskeletal: No acute or significant osseous findings.

Review of the MIP images confirms the above findings.
IMPRESSION: 1. Status post aortic root replacement with some residual dilatation
of the proximal root measuring up to 4.9-5.0 cm. The ascending
aortic graft is normally patent and demonstrates no evidence of
anastomotic pseudoaneurysm.
2. Significant enlargement of the surrounding aneurysm sac since the
prior unenhanced CT in 3661 with maximal sac dimensions of 5.9 x
cm (remeasured at 4.2 x 4.7 cm on the unenhanced 3661 study). On the
arterial phase, there are some opacified lumbar arteries at the L3,
L4 and L5 levels that approach the posterior sac. Findings would
support the presence of a type II endoleak supplied by one or more
lumbar arteries but the actual focal inflow is difficult to
visualize by CTA. This is likely partly due to technical factors due
to the patient's size and degree of arterial contrast opacification.
There is no evidence of type I endoleak.
3. Aneurysmal disease of the right common iliac artery just distal
to the right iliac endograft limb measures 2.4 cm.
4. Fusiform dilatation of a normally patent celiac trunk measuring
up to 13 mm.
5. Cholelithiasis.
6. Multiple bilateral renal cysts. In addition, some hyperdense
cortical lesions are identified with the largest measuring 2.4 cm at
the level of the lower pole of the right kidney. These may represent
hemorrhagic cysts. Some of these may be acquired from hemodialysis.
Additional follow-up with CT recommended.
